# Patient Record
Sex: Female | Born: 1950 | Race: White | Hispanic: No | Smoking: Never smoker
Health system: Southern US, Community
[De-identification: ages and names within clinical notes are randomized; demographics above are authoritative.]

## PROBLEM LIST (undated history)

## (undated) DIAGNOSIS — Q600 Renal agenesis, unilateral: Secondary | ICD-10-CM

## (undated) DIAGNOSIS — I10 Essential (primary) hypertension: Secondary | ICD-10-CM

## (undated) DIAGNOSIS — N390 Urinary tract infection, site not specified: Secondary | ICD-10-CM

## (undated) DIAGNOSIS — Q5128 Other doubling of uterus, other specified: Secondary | ICD-10-CM

## (undated) DIAGNOSIS — B019 Varicella without complication: Secondary | ICD-10-CM

## (undated) DIAGNOSIS — Q512 Other doubling of uterus, unspecified: Secondary | ICD-10-CM

## (undated) HISTORY — DX: Essential (primary) hypertension: I10

## (undated) HISTORY — DX: Other doubling of uterus, unspecified: Q51.20

## (undated) HISTORY — DX: Other and unspecified doubling of uterus: Q51.28

## (undated) HISTORY — DX: Urinary tract infection, site not specified: N39.0

## (undated) HISTORY — DX: Renal agenesis, unilateral: Q60.0

## (undated) HISTORY — DX: Varicella without complication: B01.9

---

## 2011-09-13 ENCOUNTER — Encounter: Payer: Self-pay | Admitting: Internal Medicine

## 2011-09-13 ENCOUNTER — Ambulatory Visit (INDEPENDENT_AMBULATORY_CARE_PROVIDER_SITE_OTHER): Payer: BC Managed Care – PPO | Admitting: Internal Medicine

## 2011-09-13 VITALS — BP 182/102 | HR 78 | Temp 97.8°F | Ht 63.0 in | Wt 171.0 lb

## 2011-09-13 DIAGNOSIS — Z1239 Encounter for other screening for malignant neoplasm of breast: Secondary | ICD-10-CM

## 2011-09-13 DIAGNOSIS — Z1211 Encounter for screening for malignant neoplasm of colon: Secondary | ICD-10-CM

## 2011-09-13 DIAGNOSIS — N183 Chronic kidney disease, stage 3 unspecified: Secondary | ICD-10-CM | POA: Insufficient documentation

## 2011-09-13 DIAGNOSIS — E118 Type 2 diabetes mellitus with unspecified complications: Secondary | ICD-10-CM | POA: Insufficient documentation

## 2011-09-13 DIAGNOSIS — Z124 Encounter for screening for malignant neoplasm of cervix: Secondary | ICD-10-CM | POA: Insufficient documentation

## 2011-09-13 DIAGNOSIS — Q605 Renal hypoplasia, unspecified: Secondary | ICD-10-CM

## 2011-09-13 DIAGNOSIS — E119 Type 2 diabetes mellitus without complications: Secondary | ICD-10-CM | POA: Insufficient documentation

## 2011-09-13 DIAGNOSIS — I10 Essential (primary) hypertension: Secondary | ICD-10-CM

## 2011-09-13 DIAGNOSIS — E1165 Type 2 diabetes mellitus with hyperglycemia: Secondary | ICD-10-CM

## 2011-09-13 DIAGNOSIS — IMO0002 Reserved for concepts with insufficient information to code with codable children: Secondary | ICD-10-CM

## 2011-09-13 DIAGNOSIS — I152 Hypertension secondary to endocrine disorders: Secondary | ICD-10-CM | POA: Insufficient documentation

## 2011-09-13 DIAGNOSIS — Q602 Renal agenesis, unspecified: Secondary | ICD-10-CM

## 2011-09-13 DIAGNOSIS — Q600 Renal agenesis, unilateral: Secondary | ICD-10-CM | POA: Insufficient documentation

## 2011-09-13 DIAGNOSIS — IMO0001 Reserved for inherently not codable concepts without codable children: Secondary | ICD-10-CM

## 2011-09-13 DIAGNOSIS — I150 Renovascular hypertension: Secondary | ICD-10-CM | POA: Insufficient documentation

## 2011-09-13 MED ORDER — METFORMIN HCL 500 MG PO TABS: 500.0000 mg | ORAL_TABLET | Freq: Two times a day (BID) | ORAL | Status: DC

## 2011-09-13 MED ORDER — LISINOPRIL 10 MG PO TABS: 10.0000 mg | ORAL_TABLET | Freq: Every day | ORAL | Status: DC

## 2011-09-13 NOTE — Assessment & Plan Note (Signed)
Will set up screening colonoscopy. 

## 2011-09-13 NOTE — Progress Notes (Signed)
Subjective:    Patient ID: Danielle Park, female    DOB: September 11, 1950, 61 y.o.   MRN: [id number]  HPI 61 year old female with history of diabetes and hypertension presents to establish care. She reports she was recently diagnosed with diabetes when found to have sugar in her urine. She was started on metformin. She ran out of her metformin approximately 2 weeks ago. She does occasionally take her husband's metformin. She does not regularly check her blood sugars. She reports that she has difficulty affording the test strips. She is unsure what her last hemoglobin A1c was.  In regards to her hypertension, she also reports that she has been out of her lisinopril for about 2 weeks. She denies any headache, chest pain, palpitations. She has not been regularly checking her blood pressure. However, when she has checked in the past it has been near 100/90.  Outpatient Encounter Prescriptions as of 09/13/2011  Medication Sig Dispense Refill  . lisinopril (PRINIVIL,ZESTRIL) 10 MG tablet Take 1 tablet (10 mg total) by mouth daily.  90 tablet  3  . metFORMIN (GLUCOPHAGE) 500 MG tablet Take 1 tablet (500 mg total) by mouth 2 (two) times daily with a meal.  180 tablet  3   BP 182/102  Pulse 78  Temp 97.8 F (36.6 C) (Oral)  Ht 5\' 3"  (1.6 m)  Wt 171 lb (77.565 kg)  BMI 30.29 kg/m2  SpO2 98%  Review of Systems  Constitutional: Negative for fever, chills, appetite change, fatigue and unexpected weight change.  HENT: Negative for ear pain, congestion, sore throat, trouble swallowing, neck pain, voice change and sinus pressure.   Eyes: Negative for visual disturbance.  Respiratory: Negative for cough, shortness of breath, wheezing and stridor.   Cardiovascular: Negative for chest pain, palpitations and leg swelling.  Gastrointestinal: Negative for nausea, vomiting, abdominal pain, diarrhea, constipation, blood in stool, abdominal distention and anal bleeding.  Genitourinary: Negative for dysuria and flank  pain.  Musculoskeletal: Negative for myalgias, arthralgias and gait problem.  Skin: Negative for color change and rash.  Neurological: Negative for dizziness and headaches.  Hematological: Negative for adenopathy. Does not bruise/bleed easily.  Psychiatric/Behavioral: Negative for suicidal ideas, disturbed wake/sleep cycle and dysphoric mood. The patient is not nervous/anxious.        Objective:   Physical Exam  Constitutional: She is oriented to person, place, and time. She appears well-developed and well-nourished. No distress.  HENT:  Head: Normocephalic and atraumatic.  Right Ear: External ear normal.  Left Ear: External ear normal.  Nose: Nose normal.  Mouth/Throat: Oropharynx is clear and moist. No oropharyngeal exudate.  Eyes: Conjunctivae are normal. Pupils are equal, round, and reactive to light. Right eye exhibits no discharge. Left eye exhibits no discharge. No scleral icterus.  Neck: Normal range of motion. Neck supple. No tracheal deviation present. No thyromegaly present.  Cardiovascular: Normal rate, regular rhythm, normal heart sounds and intact distal pulses.  Exam reveals no gallop and no friction rub.   No murmur heard. Pulmonary/Chest: Effort normal and breath sounds normal. No respiratory distress. She has no wheezes. She has no rales. She exhibits no tenderness.  Abdominal: Soft. Bowel sounds are normal. She exhibits no distension and no mass. There is no tenderness. There is no rebound and no guarding.  Musculoskeletal: Normal range of motion. She exhibits no edema and no tenderness.  Lymphadenopathy:    She has no cervical adenopathy.  Neurological: She is alert and oriented to person, place, and time. No cranial nerve deficit. She  exhibits normal muscle tone. Coordination normal.  Skin: Skin is warm and dry. No rash noted. She is not diaphoretic. No erythema. No pallor.  Psychiatric: She has a normal mood and affect. Her behavior is normal. Judgment and thought  content normal.          Assessment & Plan:

## 2011-09-13 NOTE — Assessment & Plan Note (Signed)
>>  ASSESSMENT AND PLAN FOR HYPERTENSION ASSOCIATED WITH DIABETES (HCC) WRITTEN ON 09/13/2011  9:08 AM BY WALKER, JENNIFER A, MD  BP markedly elevated today. Pt has been off Lisinopril . Will restart. Will check renal function with labs in 1 week. Pt will check BP this week and email or call with results.

## 2011-09-13 NOTE — Assessment & Plan Note (Signed)
BG elevated, however pt out of medication. Will restart Metformin and check A1c in 1 week with labs. Pt refuses pneumovax and Tdap. Pt will set up referral for yearly eye exam. Follow up 1 month.

## 2011-09-13 NOTE — Assessment & Plan Note (Signed)
>>  ASSESSMENT AND PLAN FOR CONTROLLED DIABETES MELLITUS TYPE 2 WITH COMPLICATIONS (HCC) WRITTEN ON 09/13/2011  9:07 AM BY WALKER, JENNIFER A, MD  BG elevated, however pt out of medication. Will restart Metformin  and check A1c in 1 week with labs. Pt refuses pneumovax and Tdap. Pt will set up referral for yearly eye exam. Follow up 1 month.

## 2011-09-13 NOTE — Assessment & Plan Note (Signed)
BP markedly elevated today. Pt has been off Lisinopril. Will restart. Will check renal function with labs in 1 week. Pt will check BP this week and email or call with results.

## 2011-09-20 ENCOUNTER — Other Ambulatory Visit (INDEPENDENT_AMBULATORY_CARE_PROVIDER_SITE_OTHER): Payer: BC Managed Care – PPO | Admitting: *Deleted

## 2011-09-20 DIAGNOSIS — E1165 Type 2 diabetes mellitus with hyperglycemia: Secondary | ICD-10-CM

## 2011-09-21 LAB — COMPREHENSIVE METABOLIC PANEL
CO2: 27 mEq/L (ref 19–32)
Creatinine, Ser: 0.8 mg/dL (ref 0.4–1.2)
GFR: 79.88 mL/min (ref >60.00)
Glucose, Bld: 141 mg/dL — ABNORMAL HIGH (ref 70–99)
Total Bilirubin: 0.4 mg/dL (ref 0.3–1.2)

## 2011-09-21 LAB — LIPID PANEL
HDL: 44.9 mg/dL (ref >39.00)
Triglycerides: 244 mg/dL — ABNORMAL HIGH (ref 0.0–149.0)

## 2011-09-21 LAB — HEMOGLOBIN A1C: Hgb A1c MFr Bld: 9.6 % — ABNORMAL HIGH (ref 4.6–6.5)

## 2011-09-22 ENCOUNTER — Other Ambulatory Visit: Payer: Self-pay | Admitting: *Deleted

## 2011-09-22 MED ORDER — GLIPIZIDE ER 2.5 MG PO TB24: 2.5000 mg | ORAL_TABLET | Freq: Every day | ORAL | Status: DC

## 2011-10-04 ENCOUNTER — Encounter: Payer: BC Managed Care – PPO | Admitting: Internal Medicine

## 2011-10-05 DIAGNOSIS — I10 Essential (primary) hypertension: Secondary | ICD-10-CM

## 2011-10-05 DIAGNOSIS — M79661 Pain in right lower leg: Secondary | ICD-10-CM

## 2011-10-05 DIAGNOSIS — I7025 Atherosclerosis of native arteries of other extremities with ulceration: Secondary | ICD-10-CM

## 2011-11-21 ENCOUNTER — Other Ambulatory Visit (INDEPENDENT_AMBULATORY_CARE_PROVIDER_SITE_OTHER): Payer: BC Managed Care – PPO

## 2011-11-21 ENCOUNTER — Telehealth: Payer: Self-pay | Admitting: Internal Medicine

## 2011-11-21 DIAGNOSIS — N390 Urinary tract infection, site not specified: Secondary | ICD-10-CM

## 2011-11-21 DIAGNOSIS — R350 Frequency of micturition: Secondary | ICD-10-CM

## 2011-11-21 LAB — POCT URINALYSIS DIPSTICK
Bilirubin, UA: NEGATIVE
Ketones, UA: NEGATIVE
pH, UA: 5.5

## 2011-11-21 NOTE — Telephone Encounter (Signed)
Caller: Yoceline/Patient; Patient Name: Danielle Park; PCP: Ronna Polio (Adults only); Best Callback Phone Number: [phone].  Call regarding Urinary burning and frequency, onset 8-26.  Afebrile.  Patient didn't check FSBS on 8-27 due to drinking Cranberry juice for urinary symptoms.  All emergent symptoms ruled out per Urinary Symptoms Protocol, see in 24 hours.  No available appointments on 8-27, Patient would like to be seen on 8-27 if possible; otherwise, Patient will go to Urgent Care.  Please follow up with Patient.

## 2011-11-21 NOTE — Telephone Encounter (Signed)
Tried calling patient x 2, but line was busy. Will try again later.

## 2011-11-22 ENCOUNTER — Other Ambulatory Visit: Payer: Self-pay | Admitting: *Deleted

## 2011-11-22 MED ORDER — CIPROFLOXACIN HCL 500 MG PO TABS: 500.0000 mg | ORAL_TABLET | Freq: Two times a day (BID) | ORAL | Status: AC

## 2011-11-24 LAB — URINE CULTURE: Colony Count: >=100000

## 2011-12-21 ENCOUNTER — Ambulatory Visit: Payer: BC Managed Care – PPO | Admitting: Internal Medicine

## 2011-12-28 ENCOUNTER — Ambulatory Visit (INDEPENDENT_AMBULATORY_CARE_PROVIDER_SITE_OTHER): Payer: BC Managed Care – PPO | Admitting: Internal Medicine

## 2011-12-28 ENCOUNTER — Encounter: Payer: Self-pay | Admitting: Internal Medicine

## 2011-12-28 VITALS — BP 150/100 | HR 77 | Temp 98.2°F | Ht 63.0 in | Wt 176.2 lb

## 2011-12-28 DIAGNOSIS — R300 Dysuria: Secondary | ICD-10-CM

## 2011-12-28 DIAGNOSIS — I10 Essential (primary) hypertension: Secondary | ICD-10-CM

## 2011-12-28 DIAGNOSIS — E1165 Type 2 diabetes mellitus with hyperglycemia: Secondary | ICD-10-CM

## 2011-12-28 MED ORDER — LISINOPRIL 20 MG PO TABS: 20.0000 mg | ORAL_TABLET | Freq: Every day | ORAL | Status: DC

## 2011-12-28 MED ORDER — GLIPIZIDE ER 5 MG PO TB24: 5.0000 mg | ORAL_TABLET | Freq: Every day | ORAL | Status: DC

## 2011-12-29 LAB — POCT URINALYSIS DIPSTICK
Bilirubin, UA: NEGATIVE
Glucose, UA: NEGATIVE
Leukocytes, UA: NEGATIVE
Nitrite, UA: NEGATIVE

## 2012-01-01 LAB — CULTURE, URINE COMPREHENSIVE

## 2012-01-02 ENCOUNTER — Other Ambulatory Visit: Payer: Self-pay | Admitting: *Deleted

## 2012-01-02 MED ORDER — SULFAMETHOXAZOLE-TMP DS 800-160 MG PO TABS: 1.0000 | ORAL_TABLET | Freq: Two times a day (BID) | ORAL | Status: DC

## 2012-01-03 ENCOUNTER — Other Ambulatory Visit: Payer: Self-pay

## 2012-01-04 ENCOUNTER — Other Ambulatory Visit (INDEPENDENT_AMBULATORY_CARE_PROVIDER_SITE_OTHER): Payer: BC Managed Care – PPO

## 2012-01-04 DIAGNOSIS — IMO0002 Reserved for concepts with insufficient information to code with codable children: Secondary | ICD-10-CM

## 2012-01-04 DIAGNOSIS — I10 Essential (primary) hypertension: Secondary | ICD-10-CM

## 2012-01-04 DIAGNOSIS — E1165 Type 2 diabetes mellitus with hyperglycemia: Secondary | ICD-10-CM

## 2012-01-05 LAB — COMPREHENSIVE METABOLIC PANEL
ALT: 11 U/L (ref 0–35)
AST: 17 U/L (ref 0–37)
Alkaline Phosphatase: 92 U/L (ref 39–117)
BUN: 11 mg/dL (ref 6–23)
Chloride: 104 mEq/L (ref 96–112)
Creatinine, Ser: 1 mg/dL (ref 0.4–1.2)
Total Bilirubin: 0.4 mg/dL (ref 0.3–1.2)

## 2012-01-05 LAB — HEMOGLOBIN A1C: Hgb A1c MFr Bld: 7.2 % — ABNORMAL HIGH (ref 4.6–6.5)

## 2012-01-09 ENCOUNTER — Encounter: Payer: Self-pay | Admitting: *Deleted

## 2012-03-27 HISTORY — PX: RENAL ARTERY STENT: SHX2321

## 2012-04-04 ENCOUNTER — Ambulatory Visit: Payer: BC Managed Care – PPO | Admitting: Internal Medicine

## 2012-05-01 ENCOUNTER — Other Ambulatory Visit: Payer: Self-pay | Admitting: Internal Medicine

## 2012-05-13 ENCOUNTER — Telehealth: Payer: Self-pay | Admitting: *Deleted

## 2012-05-13 ENCOUNTER — Other Ambulatory Visit (INDEPENDENT_AMBULATORY_CARE_PROVIDER_SITE_OTHER): Payer: BC Managed Care – PPO

## 2012-05-13 DIAGNOSIS — E1059 Type 1 diabetes mellitus with other circulatory complications: Secondary | ICD-10-CM

## 2012-05-13 DIAGNOSIS — E139 Other specified diabetes mellitus without complications: Secondary | ICD-10-CM

## 2012-05-13 DIAGNOSIS — I10 Essential (primary) hypertension: Secondary | ICD-10-CM

## 2012-05-13 LAB — COMPREHENSIVE METABOLIC PANEL
ALT: 11 U/L (ref 0–35)
AST: 15 U/L (ref 0–37)
Alkaline Phosphatase: 107 U/L (ref 39–117)
Creatinine, Ser: 0.8 mg/dL (ref 0.4–1.2)
GFR: 82.14 mL/min (ref >60.00)
Total Bilirubin: 0.5 mg/dL (ref 0.3–1.2)

## 2012-05-13 LAB — LIPID PANEL
HDL: 44.1 mg/dL (ref >39.00)
Total CHOL/HDL Ratio: 5
Triglycerides: 188 mg/dL — ABNORMAL HIGH (ref 0.0–149.0)

## 2012-05-13 LAB — TSH: TSH: 4.49 u[IU]/mL (ref 0.35–5.50)

## 2012-05-13 NOTE — Telephone Encounter (Signed)
What labs and dx would you like for this pt?  

## 2012-05-13 NOTE — Telephone Encounter (Signed)
She has multiple canceled visits. Will need visit prior to ordering labs.

## 2012-05-14 NOTE — Progress Notes (Signed)
Left message on patient cell phone voicemail with information.

## 2012-05-15 ENCOUNTER — Encounter: Payer: Self-pay | Admitting: Internal Medicine

## 2012-05-15 ENCOUNTER — Ambulatory Visit (INDEPENDENT_AMBULATORY_CARE_PROVIDER_SITE_OTHER): Payer: BC Managed Care – PPO | Admitting: Internal Medicine

## 2012-05-15 VITALS — BP 210/110 | HR 80 | Temp 98.3°F | Wt 179.0 lb

## 2012-05-15 DIAGNOSIS — IMO0001 Reserved for inherently not codable concepts without codable children: Secondary | ICD-10-CM

## 2012-05-15 DIAGNOSIS — M79609 Pain in unspecified limb: Secondary | ICD-10-CM

## 2012-05-15 DIAGNOSIS — I10 Essential (primary) hypertension: Secondary | ICD-10-CM

## 2012-05-15 DIAGNOSIS — M79651 Pain in right thigh: Secondary | ICD-10-CM | POA: Insufficient documentation

## 2012-05-15 DIAGNOSIS — E1165 Type 2 diabetes mellitus with hyperglycemia: Secondary | ICD-10-CM

## 2012-05-15 LAB — MICROALBUMIN / CREATININE URINE RATIO
Microalb Creat Ratio: 44.8 mg/g — ABNORMAL HIGH (ref 0.0–30.0)
Microalb, Ur: 6 mg/dL — ABNORMAL HIGH (ref 0.0–1.9)

## 2012-05-15 MED ORDER — GLIPIZIDE ER 10 MG PO TB24: 10.0000 mg | ORAL_TABLET | Freq: Every day | ORAL | Status: DC

## 2012-05-15 MED ORDER — LISINOPRIL 40 MG PO TABS: 40.0000 mg | ORAL_TABLET | Freq: Every day | ORAL | Status: DC

## 2012-05-15 NOTE — Assessment & Plan Note (Signed)
Consistent with muscular strain. Will continue to monitor. Tylenol prn pain.

## 2012-05-15 NOTE — Progress Notes (Signed)
Subjective:    Patient ID: Danielle Park, female    DOB: 11-26-1950, 62 y.o.   MRN: [id number]  HPI 61YO female presents for follow up.  DM - BG running high, 150s-200. Compliant with glipizide. Has not been exercising as much because of right thigh pain after injury on elliptical. Following healthy diet.  HTN - BP running high 190-200/100s.  Compliant with lisinopril. No chest pain, headache, or other symptoms.  Has not been taking any NSAIDS or other medications.  Notes some right thigh pain after using ellipitical machine in 02/2012.  Pain has gradually improved without intervention. No weakness noted.    Outpatient Encounter Prescriptions as of 05/15/2012  Medication Sig Dispense Refill  . aspirin 81 MG tablet Take 81 mg by mouth daily.      Marland Kitchen lisinopril (PRINIVIL,ZESTRIL) 40 MG tablet Take 1 tablet (40 mg total) by mouth daily.  90 tablet  3  . metFORMIN (GLUCOPHAGE) 500 MG tablet Take 1 tablet (500 mg total) by mouth 2 (two) times daily with a meal.  180 tablet  3  . [DISCONTINUED] glipiZIDE (GLUCOTROL XL) 5 MG 24 hr tablet TAKE ONE TABLET BY MOUTH EVERY DAY  30 tablet  2  . [DISCONTINUED] lisinopril (PRINIVIL,ZESTRIL) 20 MG tablet Take 1 tablet (20 mg total) by mouth daily.  90 tablet  3  . glipiZIDE (GLIPIZIDE XL) 10 MG 24 hr tablet Take 1 tablet (10 mg total) by mouth daily.  90 tablet  3  . [DISCONTINUED] sulfamethoxazole-trimethoprim (BACTRIM DS) 800-160 MG per tablet Take 1 tablet by mouth 2 (two) times daily.  14 tablet  0   No facility-administered encounter medications on file as of 05/15/2012.   BP 210/110  Pulse 80  Temp(Src) 98.3 F (36.8 C) (Oral)  Wt 179 lb (81.194 kg)  BMI 31.72 kg/m2  SpO2 98%  Review of Systems  Constitutional: Negative for fever, chills, appetite change, fatigue and unexpected weight change.  HENT: Negative for ear pain, congestion, sore throat, trouble swallowing, neck pain, voice change and sinus pressure.   Eyes: Negative for visual  disturbance.  Respiratory: Negative for cough, shortness of breath, wheezing and stridor.   Cardiovascular: Negative for chest pain, palpitations and leg swelling.  Gastrointestinal: Negative for nausea, vomiting, abdominal pain, diarrhea, constipation, blood in stool, abdominal distention and anal bleeding.  Genitourinary: Negative for dysuria and flank pain.  Musculoskeletal: Positive for myalgias. Negative for arthralgias and gait problem.  Skin: Negative for color change and rash.  Neurological: Negative for dizziness and headaches.  Hematological: Negative for adenopathy. Does not bruise/bleed easily.  Psychiatric/Behavioral: Negative for suicidal ideas, sleep disturbance and dysphoric mood. The patient is not nervous/anxious.        Objective:   Physical Exam  Constitutional: She is oriented to person, place, and time. She appears well-developed and well-nourished. No distress.  HENT:  Head: Normocephalic and atraumatic.  Right Ear: External ear normal.  Left Ear: External ear normal.  Nose: Nose normal.  Mouth/Throat: Oropharynx is clear and moist. No oropharyngeal exudate.  Eyes: Conjunctivae are normal. Pupils are equal, round, and reactive to light. Right eye exhibits no discharge. Left eye exhibits no discharge. No scleral icterus.  Neck: Normal range of motion. Neck supple. No tracheal deviation present. No thyromegaly present.  Cardiovascular: Normal rate, regular rhythm, normal heart sounds and intact distal pulses.  Exam reveals no gallop and no friction rub.   No murmur heard. Pulmonary/Chest: Effort normal and breath sounds normal. No respiratory distress. She has no  wheezes. She has no rales. She exhibits no tenderness.  Musculoskeletal: Normal range of motion. She exhibits no edema and no tenderness.       Right hip: She exhibits normal range of motion, normal strength, no tenderness, no bony tenderness, no swelling and no deformity.  Lymphadenopathy:    She has no  cervical adenopathy.  Neurological: She is alert and oriented to person, place, and time. No cranial nerve deficit. She exhibits normal muscle tone. Coordination normal.  Skin: Skin is warm and dry. No rash noted. She is not diaphoretic. No erythema. No pallor.  Psychiatric: She has a normal mood and affect. Her behavior is normal. Judgment and thought content normal.          Assessment & Plan:

## 2012-05-15 NOTE — Assessment & Plan Note (Signed)
>>  ASSESSMENT AND PLAN FOR HYPERTENSION ASSOCIATED WITH DIABETES (HCC) WRITTEN ON 05/15/2012 10:37 AM BY WALKER, JENNIFER A, MD  BP markedly elevated last 2-3 weeks.  Renal function normal. Will check urine microalbumin. Will increase Lisinopril  to 40mg  daily and have her continue to monitor BP at home. Plan to follow up 1 week and recheck Cr and K with that visit. She will call sooner if any concerns, or if BP consistently >160/100.

## 2012-05-15 NOTE — Assessment & Plan Note (Signed)
BP markedly elevated last 2-3 weeks.  Renal function normal. Will check urine microalbumin. Will increase Lisinopril to 40mg  daily and have her continue to monitor BP at home. Plan to follow up 1 week and recheck Cr and K with that visit. She will call sooner if any concerns, or if BP consistently >160/100.

## 2012-05-15 NOTE — Assessment & Plan Note (Signed)
>>  ASSESSMENT AND PLAN FOR CONTROLLED DIABETES MELLITUS TYPE 2 WITH COMPLICATIONS (HCC) WRITTEN ON 05/15/2012 10:38 AM BY VANNIE NEST A, MD  Lab Results  Component Value Date   HGBA1C 8.0* 05/13/2012   A1c elevated, consistent with pt record of elevated BG. Will increase glipizide  to 10mg  daily. Pt will continue to monitor BG and bring record to follow up visit 1 week.

## 2012-05-15 NOTE — Assessment & Plan Note (Signed)
Lab Results  Component Value Date   HGBA1C 8.0* 05/13/2012   A1c elevated, consistent with pt record of elevated BG. Will increase glipizide to 10mg  daily. Pt will continue to monitor BG and bring record to follow up visit 1 week.

## 2012-05-16 ENCOUNTER — Telehealth: Payer: Self-pay | Admitting: *Deleted

## 2012-05-16 DIAGNOSIS — I10 Essential (primary) hypertension: Secondary | ICD-10-CM

## 2012-05-16 NOTE — Telephone Encounter (Signed)
Patient has agreed to this, do we need to send them a referral for this?

## 2012-05-16 NOTE — Telephone Encounter (Signed)
Ultrasound ordered

## 2012-05-16 NOTE — Telephone Encounter (Signed)
Patient aware they will be calling her to schedule.

## 2012-05-16 NOTE — Telephone Encounter (Signed)
Message copied by Theola Sequin on Thu May 16, 2012  8:23 AM ------      Message from: Ronna Polio A      Created: Wed May 15, 2012  4:49 PM       Labs show that urine protein levels are slightly higher. I would like to get an ultrasound of her kidney (she has 1 kidney) for additional evaluation. I would prefer to do this through [facility]. ------

## 2012-05-17 ENCOUNTER — Emergency Department: Payer: Self-pay | Admitting: Emergency Medicine

## 2012-05-17 ENCOUNTER — Telehealth: Payer: Self-pay | Admitting: *Deleted

## 2012-05-17 LAB — CBC
HCT: 44.3 % (ref 35.0–47.0)
HGB: 14.6 g/dL (ref 12.0–16.0)
MCH: 29.3 pg (ref 26.0–34.0)
MCHC: 32.9 g/dL (ref 32.0–36.0)
MCV: 89 fL (ref 80–100)
Platelet: 198 10*3/uL (ref 150–440)
RDW: 13.6 % (ref 11.5–14.5)
WBC: 8.6 10*3/uL (ref 3.6–11.0)

## 2012-05-17 LAB — COMPREHENSIVE METABOLIC PANEL
Albumin: 3.9 g/dL (ref 3.4–5.0)
BUN: 10 mg/dL (ref 7–18)
Bilirubin,Total: 0.4 mg/dL (ref 0.2–1.0)
Chloride: 108 mmol/L — ABNORMAL HIGH (ref 98–107)
Co2: 23 mmol/L (ref 21–32)
EGFR (African American): >60
Glucose: 275 mg/dL — ABNORMAL HIGH (ref 65–99)
Osmolality: 292 (ref 275–301)
Potassium: 3.6 mmol/L (ref 3.5–5.1)
SGOT(AST): 19 U/L (ref 15–37)

## 2012-05-17 LAB — URINALYSIS, COMPLETE
Bilirubin,UR: NEGATIVE
Ketone: NEGATIVE
Leukocyte Esterase: NEGATIVE
Nitrite: NEGATIVE
Protein: 30
RBC,UR: 1 /HPF (ref 0–5)
Specific Gravity: 1.003 (ref 1.003–1.030)
WBC UR: 2 /HPF (ref 0–5)

## 2012-05-17 LAB — CK TOTAL AND CKMB (NOT AT ARMC): CK, Total: 80 U/L (ref 21–215)

## 2012-05-17 NOTE — Telephone Encounter (Signed)
Advised patient to be evaluated today either at an urgent care or the ER, patient verbally agreed.

## 2012-05-17 NOTE — Telephone Encounter (Signed)
Patient Information:  Caller Name: [name]  Phone: [phone]  Patient: Danielle Park, Danielle Park  Gender: Female  DOB: 11-13-1950  Age: 62 Years  PCP: Ronna Polio (Adults only)  Office Follow Up:  Does the office need to follow up with this patient?: Yes  Instructions For The Office: Share b/p findings with Dr. Dan Humphreys and give patient the date and time of the additional testing ordered.   Symptoms  Reason For Call & Symptoms: Patient calling with an update on her b/p.  Her medication was doubled 2/19 at her office visit.  Her b/p today is 238/102.  Also needs to know if the kidney test has been scheduled?  Reviewed Health History In EMR: N/A  Reviewed Medications In EMR: N/A  Reviewed Allergies In EMR: N/A  Reviewed Surgeries / Procedures: N/A  Date of Onset of Symptoms: 05/15/2012  Treatments Tried: b/p medications were doubled on 2/19  Treatments Tried Worked: No  Guideline(s) Used:  High Blood Pressure  Disposition Per Guideline:   See Today in Office  Reason For Disposition Reached:   BP > 180/110  Advice Given:  N/A  RN Overrode Recommendation:  Document Patient  Was told to call and report the b/p findings.

## 2012-05-17 NOTE — Telephone Encounter (Signed)
If blood pressure remaining over 200 - I would recommend her being reevaluated- with blood pressure this level.  eval today.

## 2012-05-17 NOTE — Telephone Encounter (Signed)
Patient was seen on Wednesday 2/19 for hypertension. Her  BP has been running high over the last couple days at 200s over 100s and Dr. Dan Humphreys increased her Lisinopril, she is now taking 2 pills a day and she began this on Wednesday. BP is still running high and she is concerned, even though it has only been 2 days since increasing BP medication. Informed her that I would pass this message on to the physician but it could be she just have not given the medication time to take effect. Per patient Dr. Dan Humphreys told her if doubling the Lisinopril did not help in a couple days then give her a call back so she may add another medication. Please advise

## 2012-05-21 ENCOUNTER — Encounter: Payer: Self-pay | Admitting: Adult Health

## 2012-05-21 ENCOUNTER — Ambulatory Visit (INDEPENDENT_AMBULATORY_CARE_PROVIDER_SITE_OTHER): Payer: BC Managed Care – PPO | Admitting: Adult Health

## 2012-05-21 VITALS — BP 200/98 | HR 80 | Temp 98.1°F | Resp 14 | Wt 177.0 lb

## 2012-05-21 DIAGNOSIS — G4733 Obstructive sleep apnea (adult) (pediatric): Secondary | ICD-10-CM

## 2012-05-21 DIAGNOSIS — Z79899 Other long term (current) drug therapy: Secondary | ICD-10-CM

## 2012-05-21 DIAGNOSIS — R030 Elevated blood-pressure reading, without diagnosis of hypertension: Secondary | ICD-10-CM

## 2012-05-21 LAB — BASIC METABOLIC PANEL
CO2: 25 mEq/L (ref 19–32)
Calcium: 9.1 mg/dL (ref 8.4–10.5)
Creatinine, Ser: 0.8 mg/dL (ref 0.4–1.2)
GFR: 78.54 mL/min (ref >60.00)
Glucose, Bld: 223 mg/dL — ABNORMAL HIGH (ref 70–99)
Sodium: 136 mEq/L (ref 135–145)

## 2012-05-21 MED ORDER — AMLODIPINE BESYLATE 5 MG PO TABS: 5.0000 mg | ORAL_TABLET | Freq: Every day | ORAL | Status: DC

## 2012-05-21 MED ORDER — HYDRALAZINE HCL 50 MG PO TABS: 50.0000 mg | ORAL_TABLET | Freq: Three times a day (TID) | ORAL | Status: DC

## 2012-05-21 NOTE — Patient Instructions (Addendum)
  Please have her labs drawn prior to leaving the office.  I have ordered a renal artery Doppler that you have done today. Amber will give you directions to your test.  Please start hydralazine 50 mg 3 times a day. Continue the lisinopril 40 mg daily and the amlodipine 5 mg daily.  We will notify you of your lab results and the Doppler results once they are available.

## 2012-05-21 NOTE — Progress Notes (Signed)
  Subjective:    Patient ID: Danielle Park, female    DOB: 1950/08/05, 62 y.o.   MRN: [id number]  HPI  Patient is a 62 year old female with history of diabetes mellitus type 2, hypertension who presents to clinic today with complaints of significantly elevated blood pressure. She reports that she saw Dr. Dan Humphreys last Wednesday and her blood pressure was elevated then. Dr. Dan Humphreys increased her lisinopril from 20 mg to 40 mg daily. Patient reports that on Friday her blood pressure remained elevated. She went to the emergency room with blood pressure 250/110. The emergency room physician started her on amlodipine 5 mg daily. Patient reports that yesterday her blood pressure was still registering 252/112. She reports that she took a double dose of her lisinopril and amlodipine. She is here this morning because of concerns that her blood pressure does not subside.  Patient denies over-the-counter use of NSAIDs. She denies headaches, dizziness, numbness or lightheadedness. Patient's husband is present during the visit and he reports that patient snores.    Current Outpatient Prescriptions on File Prior to Visit  Medication Sig Dispense Refill  . aspirin 81 MG tablet Take 81 mg by mouth daily.      Marland Kitchen glipiZIDE (GLIPIZIDE XL) 10 MG 24 hr tablet Take 1 tablet (10 mg total) by mouth daily.  90 tablet  3  . lisinopril (PRINIVIL,ZESTRIL) 40 MG tablet Take 1 tablet (40 mg total) by mouth daily.  90 tablet  3  . metFORMIN (GLUCOPHAGE) 500 MG tablet Take 1 tablet (500 mg total) by mouth 2 (two) times daily with a meal.  180 tablet  3   No current facility-administered medications on file prior to visit.      Review of Systems  Constitutional: Negative.   Respiratory: Negative.   Cardiovascular: Negative.        Concerned about elevated b/p.  Genitourinary: Negative for decreased urine volume and difficulty urinating.  Neurological: Negative for dizziness, light-headedness and headaches.   Psychiatric/Behavioral: The patient is nervous/anxious.     BP 200/98  Pulse 80  Temp(Src) 98.1 F (36.7 C) (Oral)  Resp 14  Wt 177 lb (80.287 kg)  BMI 31.36 kg/m2  SpO2 98%     Objective:   Physical Exam  Constitutional: She is oriented to person, place, and time.  Overweight. Appears concerned.  Cardiovascular: Normal rate, regular rhythm and normal heart sounds.   Pulmonary/Chest: Effort normal and breath sounds normal.  Neurological: She is alert and oriented to person, place, and time.        Assessment & Plan:

## 2012-05-21 NOTE — Assessment & Plan Note (Signed)
Blood pressure is significantly elevated during visit today. Recheck blood pressure 180/96. Will start patient on hydralazine 50 mg 3 times a day. Will check renal function since change in ace inhibitor dose last Wednesday. Will send for renal artery Doppler today. Patient is n.p.o. Also, we will order sleep study to evaluate for obstructive sleep apnea.

## 2012-05-22 ENCOUNTER — Ambulatory Visit (INDEPENDENT_AMBULATORY_CARE_PROVIDER_SITE_OTHER): Payer: BC Managed Care – PPO | Admitting: Internal Medicine

## 2012-05-22 ENCOUNTER — Encounter: Payer: Self-pay | Admitting: Internal Medicine

## 2012-05-22 VITALS — BP 210/96 | HR 78 | Temp 98.3°F | Wt 177.0 lb

## 2012-05-22 DIAGNOSIS — I10 Essential (primary) hypertension: Secondary | ICD-10-CM

## 2012-05-22 DIAGNOSIS — I701 Atherosclerosis of renal artery: Secondary | ICD-10-CM | POA: Insufficient documentation

## 2012-05-22 NOTE — Assessment & Plan Note (Addendum)
Pt found to have RAS on doppler by [facility] (per her report). Will request results on this. Plan for stent placement with Dr. Wyn Quaker tomorrow. Will hold metformin given upcoming procedure. Discussed importance of avoiding NSAIDS. Follow up here in 2 weeks. Will set up nephrology evaluation.  Over face to face contact spent with pt and husband discussing plan of care.

## 2012-05-22 NOTE — Assessment & Plan Note (Signed)
>>  ASSESSMENT AND PLAN FOR HYPERTENSION ASSOCIATED WITH DIABETES (HCC) WRITTEN ON 05/22/2012  1:42 PM BY WALKER, JENNIFER A, MD  BP elevated, however has not started Hydralazine . Encouraged her to start this today. She will proceed with stent of renal artery tomorrow.  Follow up here in 2 weeks. Plan to set up evaluation with nephrology for this week or next.

## 2012-05-22 NOTE — Patient Instructions (Addendum)
STOP METFORMIN. Do not take any ibuprofen, aleve. Start Hydralazine.  Follow up 2 weeks.  Renal Artery Stenosis Renal artery stenosis (RAS) is the narrowing of the artery that supplies blood to the kidney. If the narrowing is critical and the kidney does not get enough blood, hypertension (high blood pressure) can develop. This is called renal vascular hypertension (RVH). This is a common, uncommon cause of secondary hypertension. It does not usually happen until there is at least a 70% narrowing of the artery. Decreased blood flow through the renal artery causes the kidney to release increased amounts of a hormone. It is called renin. Renin is a strong blood pressure regulator. When it is high, it causes changes that lead to hypertension. Eventually the kidney not receiving enough blood may shrink in size and become less useful. The high blood pressure that is produced can eventually damage and destroy the remaining kidney. This is called hypertensive nephrosclerosis. If both kidneys fail, it will lead to chronic renal failure.  CAUSES  Most renal artery stenosis is caused by a hardening of the arteries (atherosclerosis). This is called Atherosclerotic Renal Artery Stenosis (AS-RAS). It is caused by a build-up of cholesterol (plaques) on the inner lining of the renal artery. A much less common cause is Fibromuscular Dysplasia (FMD). With it, there is an abnormality in the muscular lining of the renal artery. FMD-RAS occurs almost exclusively in women aged 71 to 4. It rarely affects African Americans or Asians.  SYMPTOMS  Often high blood pressure is discovered on a routine blood pressure check. It may be the only sign that something is wrong. Other problems that may occur are:  You may develop calf pain when walking. This is called intermittent claudication. It may be a sign of bad circulation in the legs.  Inability to use certain blood pressure pills such as angiotensin-I (ACE-I) inhibitors or  angiotensin receptor blockers (ARB's). These could cause sudden drops in blood pressure with worsening of kidney function.  More than three antihypertensive medications may be needed for blood pressure control.  New onset of high blood pressure if you are over 55. DIAGNOSIS  Your caregiver may find suggestions of this on exam if he finds bruits (like murmurs) on listening to your abdomen (belly) or the large arteries in your neck. Your caregiver may also suspect this there is a sudden worsening of your blood pressure when it has been well controlled and you are over age 41. Additional testing that may be done includes:  One diagnostic method used for renal artery stenosis (RAS) is to measure and compare the level of renin, (blood pressure-regulating hormone released by the kidneys), in the right to the left renal veins. If the amount of renin released by one-side is markedly higher than the other, this identifies a high renin-releasing kidney consistent with RAS.  FMD-RAS is often found on renal scan with ACE-inhibitor challenge, or ultrasound with Doppler.  FMD responds well to angioplasty and stenting. The results of stenting in FMD are usually long lasting. RISK FACTORS  Most renal artery stenosis is caused by a hardening of the arteries. This is called atherosclerosis. Other risk factors associated with the development of atherosclerotic RAS include the following:   Carotid artery disease.  Obesity.  High blood pressure.  Heredity.  Old age.  Fibromuscular dysplasia.  Diabetes mellitus.  Smoking.  Hardening of the arteries. TREATMENT   Renal vascular hypertension can be very severe. It can also be difficult to control.  Medication is used  to control high blood pressure (hypertension). Blood pressure medications that directly affect the renin angiotensin pathway can be used toe help control blood pressure. ACE inhibitors and angiotensin receptor blockers (ARB's) are often  effective in patients with unilateral RAS. In some cases, patients with RAS are resistant to these medications.  In patients with bilateral RAS, these medications must be used carefully. They may cause acute renal failure (ARF). If acute renal failure develops (if creatinine increases by more than 30%), the medication is discontinued. The patient is evaluated for bilateral RAS.  Angioplasty and stenting may be used to improve blood flow. The goal is to improve the circulation of blood flow to the kidney and prevent the release of excess renin, which can help to decrease blood pressure. This helps to prevent atrophy of the kidney. In general, patients with AS-RAS should have stenting done. This is because plasty by itself has a high incidence of re-stenosis.  Surgery to bypass the narrowing may be done. If the kidney with RAS has diminished in size or strength (atrophied ), surgical removal of the kidney may be advised. This is called nephrectomy. Document Released: 12/07/2004 Document Revised: 06/05/2011 Document Reviewed: 03/12/2008 [facility] Patient Information 2013 [place], [place].

## 2012-05-22 NOTE — Assessment & Plan Note (Addendum)
BP elevated, however has not started Hydralazine. Encouraged her to start this today. She will proceed with stent of renal artery tomorrow.  Follow up here in 2 weeks. Plan to set up evaluation with nephrology for this week or next.

## 2012-05-22 NOTE — Progress Notes (Signed)
Subjective:    Patient ID: Danielle Park, female    DOB: Sep 12, 1950, 62 y.o.   MRN: [id number]  HPI 62 year old female with history of diabetes and hypertension presents for followup. She is recently had difficulty controlling her blood pressure. Blood pressure is typically been elevated 200/100. She was seen in clinic yesterday and instructed to start hydralazine. She has not yet filled this medication. She denies any headache, chest pain, palpitations. She underwent renal artery ultrasound which showed renal artery stenosis. She is scheduled for stent placement tomorrow.  Outpatient Encounter Prescriptions as of 05/22/2012  Medication Sig Dispense Refill  . amLODipine (NORVASC) 5 MG tablet Take 1 tablet (5 mg total) by mouth daily.  30 tablet  3  . aspirin 81 MG tablet Take 81 mg by mouth daily.      Marland Kitchen glipiZIDE (GLIPIZIDE XL) 10 MG 24 hr tablet Take 1 tablet (10 mg total) by mouth daily.  90 tablet  3  . lisinopril (PRINIVIL,ZESTRIL) 40 MG tablet Take 1 tablet (40 mg total) by mouth daily.  90 tablet  3  . metFORMIN (GLUCOPHAGE) 500 MG tablet Take 1 tablet (500 mg total) by mouth 2 (two) times daily with a meal.  180 tablet  3  . hydrALAZINE (APRESOLINE) 50 MG tablet Take 1 tablet (50 mg total) by mouth 3 (three) times daily.  90 tablet  3   No facility-administered encounter medications on file as of 05/22/2012.   BP 210/96  Pulse 78  Temp(Src) 98.3 F (36.8 C) (Oral)  Wt 177 lb (80.287 kg)  BMI 31.36 kg/m2  SpO2 97%  Review of Systems  Constitutional: Negative for fever, chills, appetite change, fatigue and unexpected weight change.  HENT: Negative for ear pain, congestion, sore throat, trouble swallowing, neck pain, voice change and sinus pressure.   Eyes: Negative for visual disturbance.  Respiratory: Negative for cough, shortness of breath, wheezing and stridor.   Cardiovascular: Negative for chest pain, palpitations and leg swelling.  Gastrointestinal: Negative for nausea,  vomiting, abdominal pain, diarrhea, constipation, blood in stool, abdominal distention and anal bleeding.  Genitourinary: Negative for dysuria and flank pain.  Musculoskeletal: Negative for myalgias, arthralgias and gait problem.  Skin: Negative for color change and rash.  Neurological: Negative for dizziness and headaches.  Hematological: Negative for adenopathy. Does not bruise/bleed easily.  Psychiatric/Behavioral: Negative for suicidal ideas, sleep disturbance and dysphoric mood. The patient is not nervous/anxious.        Objective:   Physical Exam  Constitutional: She is oriented to person, place, and time. She appears well-developed and well-nourished. No distress.  HENT:  Head: Normocephalic and atraumatic.  Right Ear: External ear normal.  Left Ear: External ear normal.  Nose: Nose normal.  Mouth/Throat: Oropharynx is clear and moist. No oropharyngeal exudate.  Eyes: Conjunctivae are normal. Pupils are equal, round, and reactive to light. Right eye exhibits no discharge. Left eye exhibits no discharge. No scleral icterus.  Neck: Normal range of motion. Neck supple. No tracheal deviation present. No thyromegaly present.  Cardiovascular: Normal rate, regular rhythm, normal heart sounds and intact distal pulses.  Exam reveals no gallop and no friction rub.   No murmur heard. Pulmonary/Chest: Effort normal and breath sounds normal. No respiratory distress. She has no wheezes. She has no rales. She exhibits no tenderness.  Musculoskeletal: Normal range of motion. She exhibits no edema and no tenderness.  Lymphadenopathy:    She has no cervical adenopathy.  Neurological: She is alert and oriented to person, place, and  time. No cranial nerve deficit. She exhibits normal muscle tone. Coordination normal.  Skin: Skin is warm and dry. No rash noted. She is not diaphoretic. No erythema. No pallor.  Psychiatric: She has a normal mood and affect. Her behavior is normal. Judgment and thought  content normal.          Assessment & Plan:

## 2012-05-27 ENCOUNTER — Ambulatory Visit: Payer: Self-pay | Admitting: Vascular Surgery

## 2012-05-27 LAB — BASIC METABOLIC PANEL
Anion Gap: 5 — ABNORMAL LOW (ref 7–16)
Chloride: 108 mmol/L — ABNORMAL HIGH (ref 98–107)
Co2: 27 mmol/L (ref 21–32)
Creatinine: 0.77 mg/dL (ref 0.60–1.30)
EGFR (Non-African Amer.): >60
Glucose: 253 mg/dL — ABNORMAL HIGH (ref 65–99)
Osmolality: 287 (ref 275–301)
Potassium: 4 mmol/L (ref 3.5–5.1)
Sodium: 140 mmol/L (ref 136–145)

## 2012-06-05 ENCOUNTER — Encounter: Payer: Self-pay | Admitting: Internal Medicine

## 2012-06-05 ENCOUNTER — Ambulatory Visit (INDEPENDENT_AMBULATORY_CARE_PROVIDER_SITE_OTHER): Payer: BC Managed Care – PPO | Admitting: Internal Medicine

## 2012-06-05 VITALS — BP 178/100 | HR 59 | Temp 98.4°F | Wt 178.0 lb

## 2012-06-05 DIAGNOSIS — I10 Essential (primary) hypertension: Secondary | ICD-10-CM

## 2012-06-05 DIAGNOSIS — I701 Atherosclerosis of renal artery: Secondary | ICD-10-CM

## 2012-06-05 DIAGNOSIS — E1165 Type 2 diabetes mellitus with hyperglycemia: Secondary | ICD-10-CM

## 2012-06-05 NOTE — Assessment & Plan Note (Signed)
S/p stent placement. Doing well. Will continue to monitor. Follow up with vascular in 2 weeks.

## 2012-06-05 NOTE — Progress Notes (Signed)
Subjective:    Patient ID: Danielle Park, female    DOB: 1951-03-09, 63 y.o.   MRN: [id number]  HPI 61YO female with DM, HTN, HL presents for follow up after recent stent placement for RAS. Reports she is feeling well. Energy level improved. Denies pain at site of vascular access. BP have been improved, typically 140-160s/80-90s at home. She was also seen by nephrology and started on metoprolol. Hydralazine was discontinued.   Blood sugars continue to be elevated, often >200. Has been off metformin because of recent vascular procedure. Taking Glipizide alone.  Outpatient Encounter Prescriptions as of 06/05/2012  Medication Sig Dispense Refill  . amLODipine (NORVASC) 5 MG tablet Take 1 tablet (5 mg total) by mouth daily.  30 tablet  3  . glipiZIDE (GLIPIZIDE XL) 10 MG 24 hr tablet Take 1 tablet (10 mg total) by mouth daily.  90 tablet  3  . lisinopril (PRINIVIL,ZESTRIL) 40 MG tablet Take 1 tablet (40 mg total) by mouth daily.  90 tablet  3  . metoprolol tartrate (LOPRESSOR) 25 MG tablet Take 25 mg by mouth 2 (two) times daily.      Marland Kitchen aspirin 81 MG tablet Take 81 mg by mouth daily.      . metFORMIN (GLUCOPHAGE) 500 MG tablet Take 1 tablet (500 mg total) by mouth 2 (two) times daily with a meal.  180 tablet  3  . [DISCONTINUED] hydrALAZINE (APRESOLINE) 50 MG tablet Take 1 tablet (50 mg total) by mouth 3 (three) times daily.  90 tablet  3   No facility-administered encounter medications on file as of 06/05/2012.   BP 178/100  Pulse 59  Temp(Src) 98.4 F (36.9 C) (Oral)  Wt 178 lb (80.74 kg)  BMI 31.54 kg/m2  SpO2 98%  Review of Systems  Constitutional: Negative for fever, chills, appetite change, fatigue and unexpected weight change.  HENT: Negative for ear pain, congestion, sore throat, trouble swallowing, neck pain, voice change and sinus pressure.   Eyes: Negative for visual disturbance.  Respiratory: Negative for cough, shortness of breath, wheezing and stridor.   Cardiovascular:  Negative for chest pain, palpitations and leg swelling.  Gastrointestinal: Negative for nausea, vomiting, abdominal pain, diarrhea, constipation, blood in stool, abdominal distention and anal bleeding.  Genitourinary: Negative for dysuria and flank pain.  Musculoskeletal: Negative for myalgias, arthralgias and gait problem.  Skin: Negative for color change and rash.  Neurological: Negative for dizziness and headaches.  Hematological: Negative for adenopathy. Does not bruise/bleed easily.  Psychiatric/Behavioral: Negative for suicidal ideas, sleep disturbance and dysphoric mood. The patient is not nervous/anxious.        Objective:   Physical Exam  Constitutional: She is oriented to person, place, and time. She appears well-developed and well-nourished. No distress.  HENT:  Head: Normocephalic and atraumatic.  Right Ear: External ear normal.  Left Ear: External ear normal.  Nose: Nose normal.  Mouth/Throat: Oropharynx is clear and moist. No oropharyngeal exudate.  Eyes: Conjunctivae are normal. Pupils are equal, round, and reactive to light. Right eye exhibits no discharge. Left eye exhibits no discharge. No scleral icterus.  Neck: Normal range of motion. Neck supple. No tracheal deviation present. No thyromegaly present.  Cardiovascular: Normal rate, regular rhythm, normal heart sounds and intact distal pulses.  Exam reveals no gallop and no friction rub.   No murmur heard. Pulmonary/Chest: Effort normal and breath sounds normal. No respiratory distress. She has no wheezes. She has no rales. She exhibits no tenderness.  Musculoskeletal: Normal range of motion. She  exhibits no edema and no tenderness.  Lymphadenopathy:    She has no cervical adenopathy.  Neurological: She is alert and oriented to person, place, and time. No cranial nerve deficit. She exhibits normal muscle tone. Coordination normal.  Skin: Skin is warm and dry. No rash noted. She is not diaphoretic. No erythema. No  pallor.     Psychiatric: She has a normal mood and affect. Her behavior is normal. Judgment and thought content normal.          Assessment & Plan:

## 2012-06-05 NOTE — Assessment & Plan Note (Addendum)
BP Readings from Last 3 Encounters:  06/05/12 178/100  05/22/12 210/96  05/21/12 200/98   BP improved generally after RAS placement. Better controlled at home than during visit today. Discussed increasing amlodipine, but will hold off as she has had some symptomatic low BP at home. Will continue current medications. Follow up with nephrology in 2 weeks.

## 2012-06-05 NOTE — Assessment & Plan Note (Signed)
BG elevated. Will resume metformin. Monitor renal function closely. Continue Glipizide. If no improvement over next few weeks, will consider adding Levemir. Follow up 4-8 weeks.

## 2012-06-05 NOTE — Assessment & Plan Note (Signed)
>>  ASSESSMENT AND PLAN FOR CONTROLLED DIABETES MELLITUS TYPE 2 WITH COMPLICATIONS ([facility]) WRITTEN ON 06/05/2012 12:27 PM BY WALKER, JENNIFER A, MD  BG elevated. Will resume metformin . Monitor renal function closely. Continue Glipizide . If no improvement over next few weeks, will consider adding Levemir . Follow up 4-8 weeks.

## 2012-06-05 NOTE — Assessment & Plan Note (Signed)
>>  ASSESSMENT AND PLAN FOR HYPERTENSION ASSOCIATED WITH DIABETES ([facility]) WRITTEN ON 06/05/2012 12:26 PM BY VANNIE, JENNIFER A, MD  BP Readings from Last 3 Encounters:  06/05/12 178/100  05/22/12 210/96  05/21/12 200/98   BP improved generally after RAS placement. Better controlled at home than during visit today. Discussed increasing amlodipine , but will hold off as she has had some symptomatic low BP at home. Will continue current medications. Follow up with nephrology in 2 weeks.

## 2012-07-04 ENCOUNTER — Telehealth: Payer: Self-pay | Admitting: Internal Medicine

## 2012-07-04 NOTE — Telephone Encounter (Signed)
Thyroid was underactive on recent labs done by Dr. Luther Redo.  This is Dr. Tilman Neat patient.  Please ask patient to make appt with Dr. Dan Humphreys to discuss

## 2012-07-05 NOTE — Telephone Encounter (Signed)
Spoke with patient and she stated Dr. Wyn Quaker called her in some thyroid medication. She will hold off on taking it until she hear from Dr. Dan Humphreys. Not sure what the strength of the medication is, has not went to the pharmacy to pick it up yet.

## 2012-07-05 NOTE — Telephone Encounter (Signed)
Left message for patient to return call.

## 2012-07-05 NOTE — Telephone Encounter (Signed)
Please have pt make follow up appointment and get labs and notes from Dr. Wyn Quaker.

## 2012-07-09 NOTE — Telephone Encounter (Signed)
Left message with a female to have patient call office back

## 2012-07-09 NOTE — Telephone Encounter (Signed)
Spoke with patient and she has began taking the thyroid medication. And she has an appointment already scheduled for 5/15. If she thinks she need to be seen sooner then she will call the office the morning.

## 2012-07-16 ENCOUNTER — Telehealth: Payer: Self-pay | Admitting: *Deleted

## 2012-07-16 NOTE — Telephone Encounter (Signed)
Mandy from Dr. Alice Rieger office called and left message, requesting a call back from Dr. Dan Humphreys. Dr. Wynelle Link would like to discuss pt.

## 2012-07-16 NOTE — Telephone Encounter (Signed)
Returned phone call and left message

## 2012-07-24 ENCOUNTER — Emergency Department: Payer: Self-pay | Admitting: Emergency Medicine

## 2012-07-24 LAB — CBC
HGB: 13.4 g/dL (ref 12.0–16.0)
MCH: 29.7 pg (ref 26.0–34.0)
MCV: 86 fL (ref 80–100)
RBC: 4.5 10*6/uL (ref 3.80–5.20)
WBC: 8.3 10*3/uL (ref 3.6–11.0)

## 2012-07-24 LAB — URINALYSIS, COMPLETE
Bilirubin,UR: NEGATIVE
Ketone: NEGATIVE
Nitrite: NEGATIVE
Ph: 9 (ref 4.5–8.0)
RBC,UR: <1 /HPF (ref 0–5)
Squamous Epithelial: 1
WBC UR: 5 /HPF (ref 0–5)

## 2012-07-24 LAB — COMPREHENSIVE METABOLIC PANEL
Albumin: 3.8 g/dL (ref 3.4–5.0)
Alkaline Phosphatase: 166 U/L — ABNORMAL HIGH (ref 50–136)
BUN: 18 mg/dL (ref 7–18)
Bilirubin,Total: 0.3 mg/dL (ref 0.2–1.0)
Calcium, Total: 9.7 mg/dL (ref 8.5–10.1)
Creatinine: 0.93 mg/dL (ref 0.60–1.30)
SGOT(AST): 21 U/L (ref 15–37)
SGPT (ALT): 15 U/L (ref 12–78)
Sodium: 137 mmol/L (ref 136–145)
Total Protein: 8.4 g/dL — ABNORMAL HIGH (ref 6.4–8.2)

## 2012-07-24 LAB — TROPONIN I: Troponin-I: <0.02 ng/mL

## 2012-07-25 ENCOUNTER — Telehealth: Payer: Self-pay | Admitting: Internal Medicine

## 2012-07-25 NOTE — Telephone Encounter (Signed)
Yes, she needs to be seen tomorrow.

## 2012-07-25 NOTE — Telephone Encounter (Signed)
Put patient on the schedule for tomorrow, will call back in the morning for confirmation

## 2012-07-25 NOTE — Telephone Encounter (Signed)
Patient Information:  Caller Name: Mollie  Phone: [phone]  Patient: Danielle Park, Danielle Park  Gender: Female  DOB: 1950-10-19  Age: 62 Years  PCP: Ronna Polio (Adults only)  Office Follow Up:  Does the office need to follow up with this patient?: Yes  Instructions For The Office: OFFICE PLEASE FOLLOW UP WITH PT IF OFFICE VISIT IS NEEDED TO DISCUSS MEDICAITONS OR IF MEDICATIONS NEED TO BE CHANGED.  Pharmacy is [facility] off [address]   Symptoms  Reason For Call & Symptoms: pt reports that last night (07/24/12) her BP was 225/100.  Pt was seen in the ED and given Labetalol to bring down the BP.  BP is currently 170/75 (pt has not taken any am meds)  Pt was told to call PCP today to see if medications need to be changed.  Reviewed Health History In EMR: Yes  Reviewed Medications In EMR: Yes  Reviewed Allergies In EMR: Yes  Reviewed Surgeries / Procedures: Yes  Date of Onset of Symptoms: 07/24/2012  Guideline(s) Used:  High Blood Pressure  Disposition Per Guideline:   See Within 2 Weeks in Office  Reason For Disposition Reached:   BP > 160/100  Advice Given:  N/A  Patient Refused Recommendation:  Patient Refused Care Advice  pt wants to know if medications need to be changed and when should she take her regular medications after being treated with the Labetalol IV at 0500 this am

## 2012-07-26 ENCOUNTER — Encounter: Payer: Self-pay | Admitting: Internal Medicine

## 2012-07-26 ENCOUNTER — Ambulatory Visit (INDEPENDENT_AMBULATORY_CARE_PROVIDER_SITE_OTHER): Payer: BC Managed Care – PPO | Admitting: Internal Medicine

## 2012-07-26 VITALS — BP 200/90 | HR 70 | Temp 98.6°F | Wt 176.0 lb

## 2012-07-26 DIAGNOSIS — I701 Atherosclerosis of renal artery: Secondary | ICD-10-CM

## 2012-07-26 DIAGNOSIS — E039 Hypothyroidism, unspecified: Secondary | ICD-10-CM

## 2012-07-26 DIAGNOSIS — M79651 Pain in right thigh: Secondary | ICD-10-CM

## 2012-07-26 DIAGNOSIS — M79609 Pain in unspecified limb: Secondary | ICD-10-CM

## 2012-07-26 DIAGNOSIS — I10 Essential (primary) hypertension: Secondary | ICD-10-CM

## 2012-07-26 NOTE — Assessment & Plan Note (Addendum)
BP Readings from Last 3 Encounters:  07/26/12 200/90  06/05/12 178/100  05/22/12 210/96   BP markedly elevated today, however has been much lower at home, generally 120-130s/80s. BP was well controlled after recent renal artery stent. Sudden increase in BP concerning for stenosis of renal artery stent. Pt reports that renal US and labs in ED at [facility] 5/1 were normal. Discussed with vascular surgeon. Pt will have angiogram on Monday for re-evaluation. Will continue current medications for now, however discussed with pt possibly increasing Amlodipine to 10mg  daily if BP elevated at home >160/100. Could also use prn hydralazine if increased amlodipine not tolerated. Pt will call or email with BP and with any changes in symptoms.

## 2012-07-26 NOTE — Assessment & Plan Note (Signed)
>>  ASSESSMENT AND PLAN FOR HYPERTENSION ASSOCIATED WITH DIABETES (HCC) WRITTEN ON 07/26/2012  7:12 PM BY WALKER, JENNIFER A, MD  BP Readings from Last 3 Encounters:  07/26/12 200/90  06/05/12 178/100  05/22/12 210/96   BP markedly elevated today, however has been much lower at home, generally 120-130s/80s. BP was well controlled after recent renal artery stent. Sudden increase in BP concerning for stenosis of renal artery stent. Pt reports that renal US  and labs in ED at [facility] 5/1 were normal. Discussed with vascular surgeon. Pt will have angiogram on Monday for re-evaluation. Will continue current medications for now, however discussed with pt possibly increasing Amlodipine  to 10mg  daily if BP elevated at home >160/100. Could also use prn hydralazine  if increased amlodipine  not tolerated. Pt will call or email with BP and with any changes in symptoms.

## 2012-07-26 NOTE — Assessment & Plan Note (Signed)
Recurrent symptoms of right groin pain and lower back pain. Symptoms are relatively mild and controlled without medication. Will hold off on further evaluation/treatment for now until acute issue with BP resolved. Follow up 1 week.

## 2012-07-26 NOTE — Assessment & Plan Note (Signed)
Recently found to have elevated TSH on check by nephrologist. On Levothyroxine. Will plan to repeat TSH with labs in 08/2012.

## 2012-07-26 NOTE — Assessment & Plan Note (Signed)
Markedly elevated BP concerning for restenosis of stent. D/w Dr. Wyn Quaker. Pt evaluated in their office today. Will have angiogram on Monday with plan to re-open stent if necessary. Follow up here in 1 week.

## 2012-07-26 NOTE — Progress Notes (Signed)
Subjective:    Patient ID: Danielle Park, female    DOB: Oct 28, 1950, 62 y.o.   MRN: [id number]  HPI 62YO female with h/o DM, HTN presents for acute visit. Notes that BP has been labile recently, 120-130s/70-80s at home, then suddenly will increase to 200s/100s. Went to ED 2 days ago, was given IV labetalol with reduction of BP and had renal US which was reportedly normal. Discharged home without further intervention. Denies new medications or supplements. Has been compliant with meds as prescribed. Denies chest pain, headache, palpitations, or other symptoms.   Also concerned about some recurrent symptoms of right thigh/groin and low back pain over last few weeks. Described as aching pain. Worse with movement. Not taking any medications for this. Denies weakness in her legs. Occasionally has some aching or burning pain in lower extremities.  Outpatient Encounter Prescriptions as of 07/26/2012  Medication Sig Dispense Refill  . amLODipine (NORVASC) 5 MG tablet Take 1 tablet (5 mg total) by mouth daily.  30 tablet  3  . aspirin 81 MG tablet Take 81 mg by mouth daily.      Marland Kitchen glipiZIDE (GLIPIZIDE XL) 10 MG 24 hr tablet Take 1 tablet (10 mg total) by mouth daily.  90 tablet  3  . lisinopril (PRINIVIL,ZESTRIL) 40 MG tablet Take 1 tablet (40 mg total) by mouth daily.  90 tablet  3  . metFORMIN (GLUCOPHAGE) 500 MG tablet Take 1 tablet (500 mg total) by mouth 2 (two) times daily with a meal.  180 tablet  3  . metoprolol tartrate (LOPRESSOR) 25 MG tablet Take 25 mg by mouth 2 (two) times daily.       No facility-administered encounter medications on file as of 07/26/2012.   BP 200/90  Pulse 70  Temp(Src) 98.6 F (37 C) (Oral)  Wt 176 lb (79.833 kg)  BMI 31.18 kg/m2  SpO2 97%  Review of Systems  Constitutional: Negative for fever, chills, appetite change, fatigue and unexpected weight change.  HENT: Negative for ear pain, congestion, sore throat, trouble swallowing, neck pain, voice change and sinus  pressure.   Eyes: Negative for visual disturbance.  Respiratory: Negative for cough, shortness of breath, wheezing and stridor.   Cardiovascular: Negative for chest pain, palpitations and leg swelling.  Gastrointestinal: Negative for nausea, vomiting, abdominal pain, diarrhea, constipation, blood in stool, abdominal distention and anal bleeding.  Genitourinary: Negative for dysuria and flank pain.  Musculoskeletal: Positive for myalgias, back pain and arthralgias. Negative for gait problem.  Skin: Negative for color change and rash.  Neurological: Negative for dizziness and headaches.  Hematological: Negative for adenopathy. Does not bruise/bleed easily.  Psychiatric/Behavioral: Negative for suicidal ideas, sleep disturbance and dysphoric mood. The patient is not nervous/anxious.        Objective:   Physical Exam  Constitutional: She is oriented to person, place, and time. She appears well-developed and well-nourished. No distress.  HENT:  Head: Normocephalic and atraumatic.  Right Ear: External ear normal.  Left Ear: External ear normal.  Nose: Nose normal.  Mouth/Throat: Oropharynx is clear and moist. No oropharyngeal exudate.  Eyes: Conjunctivae are normal. Pupils are equal, round, and reactive to light. Right eye exhibits no discharge. Left eye exhibits no discharge. No scleral icterus.  Neck: Normal range of motion. Neck supple. No tracheal deviation present. No thyromegaly present.  Cardiovascular: Normal rate, regular rhythm, normal heart sounds and intact distal pulses.  Exam reveals no gallop and no friction rub.   No murmur heard. Pulmonary/Chest: Effort normal and  breath sounds normal. No respiratory distress. She has no wheezes. She has no rales. She exhibits no tenderness.  Musculoskeletal: She exhibits no edema and no tenderness.       Right hip: She exhibits normal range of motion, normal strength and no tenderness.       Lumbar back: She exhibits pain. She exhibits  normal range of motion, no tenderness, no swelling and no edema.  Lymphadenopathy:    She has no cervical adenopathy.  Neurological: She is alert and oriented to person, place, and time. No cranial nerve deficit. She exhibits normal muscle tone. Coordination normal.  Skin: Skin is warm and dry. No rash noted. She is not diaphoretic. No erythema. No pallor.  Psychiatric: She has a normal mood and affect. Her behavior is normal. Judgment and thought content normal.          Assessment & Plan:

## 2012-07-26 NOTE — Telephone Encounter (Signed)
Patient confirmed appointment for today

## 2012-07-29 ENCOUNTER — Ambulatory Visit: Payer: Self-pay | Admitting: Vascular Surgery

## 2012-07-29 LAB — BASIC METABOLIC PANEL
Anion Gap: 5 — ABNORMAL LOW (ref 7–16)
BUN: 13 mg/dL (ref 7–18)
Calcium, Total: 9.4 mg/dL (ref 8.5–10.1)
Chloride: 104 mmol/L (ref 98–107)
Co2: 28 mmol/L (ref 21–32)
EGFR (Non-African Amer.): >60
Potassium: 4.6 mmol/L (ref 3.5–5.1)

## 2012-07-31 ENCOUNTER — Encounter: Payer: Self-pay | Admitting: Internal Medicine

## 2012-07-31 ENCOUNTER — Ambulatory Visit (INDEPENDENT_AMBULATORY_CARE_PROVIDER_SITE_OTHER): Payer: BC Managed Care – PPO | Admitting: Internal Medicine

## 2012-07-31 VITALS — BP 170/72 | HR 57 | Temp 98.1°F | Wt 176.0 lb

## 2012-07-31 DIAGNOSIS — E1165 Type 2 diabetes mellitus with hyperglycemia: Secondary | ICD-10-CM

## 2012-07-31 DIAGNOSIS — I10 Essential (primary) hypertension: Secondary | ICD-10-CM

## 2012-07-31 DIAGNOSIS — I701 Atherosclerosis of renal artery: Secondary | ICD-10-CM

## 2012-07-31 LAB — COMPREHENSIVE METABOLIC PANEL
Alkaline Phosphatase: 129 U/L — ABNORMAL HIGH (ref 39–117)
BUN: 15 mg/dL (ref 6–23)
Creatinine, Ser: 0.9 mg/dL (ref 0.4–1.2)
Glucose, Bld: 313 mg/dL — ABNORMAL HIGH (ref 70–99)
Total Bilirubin: 0.7 mg/dL (ref 0.3–1.2)

## 2012-07-31 LAB — HEMOGLOBIN A1C: Hgb A1c MFr Bld: 8.3 % — ABNORMAL HIGH (ref 4.6–6.5)

## 2012-07-31 MED ORDER — HYDRALAZINE HCL 25 MG PO TABS: 25.0000 mg | ORAL_TABLET | Freq: Three times a day (TID) | ORAL | Status: DC | PRN

## 2012-07-31 MED ORDER — AMLODIPINE BESYLATE 10 MG PO TABS: 10.0000 mg | ORAL_TABLET | Freq: Every day | ORAL | Status: DC

## 2012-07-31 NOTE — Assessment & Plan Note (Signed)
Congenital single kidney. S/p repeat angioplasty of RAS on Monday.

## 2012-07-31 NOTE — Patient Instructions (Signed)
Increase dose of Amlodipine to 10mg  daily.  Start Hydralazine 25mg  up to three times daily if blood pressure >160/100.  Follow up 1 week.

## 2012-07-31 NOTE — Assessment & Plan Note (Signed)
>>  ASSESSMENT AND PLAN FOR CONTROLLED DIABETES MELLITUS TYPE 2 WITH COMPLICATIONS (HCC) WRITTEN ON 07/31/2012 11:40 AM BY WALKER, JENNIFER A, MD  Will check A1c with labs today. Will stop metformin  given side effect of generalized malaise.  Continue Glipizide . Pt will continue to monitor BG at home. Follow up 3 months for recheck A1c.

## 2012-07-31 NOTE — Assessment & Plan Note (Signed)
>>  ASSESSMENT AND PLAN FOR HYPERTENSION ASSOCIATED WITH DIABETES (HCC) WRITTEN ON 07/31/2012 11:41 AM BY VANNIE, JENNIFER A, MD  BP Readings from Last 3 Encounters:  07/31/12 170/72  07/26/12 200/90  06/05/12 178/100   BP continues to be elevated after angioplasty RAS on Monday. Will increase Amlodipine  to 10mg  daily. Will add Hydralazine  25mg  po tid prn if BP >160/100. Follow up 1 week and prn.

## 2012-07-31 NOTE — Progress Notes (Signed)
Subjective:    Patient ID: Danielle Park, female    DOB: 1950-06-17, 62 y.o.   MRN: [id number]  HPI 62 year old female with history of congenital single kidney, hypertension, renal artery stenosis status post stent placement and repeat angioplasty on Monday, 07/29/2012, and diabetes presents for followup. Patient reports that procedure on Monday but well. Mild stenosis of the renal artery was seen and she underwent angioplasty. However, blood pressures continue to be elevated. She reports that blood pressures are better controlled at home, typically less than 140/90. She denies any headache, chest pain, palpitations. She is generally feeling well.  Regards to diabetes, she reports blood sugars have been well-controlled. She notes some generalized malaise when she takes metformin and would like to stop this medication. She denies any blood sugars greater than 200 or less than 70.  Outpatient Encounter Prescriptions as of 07/31/2012  Medication Sig Dispense Refill  . amLODipine (NORVASC) 10 MG tablet Take 1 tablet (10 mg total) by mouth daily.  30 tablet  3  . aspirin 81 MG tablet Take 81 mg by mouth daily.      Marland Kitchen glipiZIDE (GLIPIZIDE XL) 10 MG 24 hr tablet Take 1 tablet (10 mg total) by mouth daily.  90 tablet  3  . levothyroxine (SYNTHROID, LEVOTHROID) 75 MCG tablet Take 75 mcg by mouth daily before breakfast.      . lisinopril (PRINIVIL,ZESTRIL) 40 MG tablet Take 1 tablet (40 mg total) by mouth daily.  90 tablet  3  . metoprolol tartrate (LOPRESSOR) 25 MG tablet Take 25 mg by mouth 2 (two) times daily.      . hydrALAZINE (APRESOLINE) 25 MG tablet Take 1 tablet (25 mg total) by mouth 3 (three) times daily as needed.  30 tablet  3   No facility-administered encounter medications on file as of 07/31/2012.   BP 170/72  Pulse 57  Temp(Src) 98.1 F (36.7 C) (Oral)  Wt 176 lb (79.833 kg)  BMI 31.18 kg/m2  SpO2 99%  Review of Systems  Constitutional: Negative for fever, chills, appetite change,  fatigue and unexpected weight change.  HENT: Negative for ear pain, congestion, sore throat, trouble swallowing, neck pain, voice change and sinus pressure.   Eyes: Negative for visual disturbance.  Respiratory: Negative for cough, shortness of breath, wheezing and stridor.   Cardiovascular: Negative for chest pain, palpitations and leg swelling.  Gastrointestinal: Negative for nausea, vomiting, abdominal pain, diarrhea, constipation, blood in stool, abdominal distention and anal bleeding.  Genitourinary: Negative for dysuria and flank pain.  Musculoskeletal: Negative for myalgias, arthralgias and gait problem.  Skin: Negative for color change and rash.  Neurological: Negative for dizziness and headaches.  Hematological: Negative for adenopathy. Does not bruise/bleed easily.  Psychiatric/Behavioral: Negative for suicidal ideas, sleep disturbance and dysphoric mood. The patient is not nervous/anxious.        Objective:   Physical Exam  Constitutional: She is oriented to person, place, and time. She appears well-developed and well-nourished. No distress.  HENT:  Head: Normocephalic and atraumatic.  Right Ear: External ear normal.  Left Ear: External ear normal.  Nose: Nose normal.  Mouth/Throat: Oropharynx is clear and moist. No oropharyngeal exudate.  Eyes: Conjunctivae are normal. Pupils are equal, round, and reactive to light. Right eye exhibits no discharge. Left eye exhibits no discharge. No scleral icterus.  Neck: Normal range of motion. Neck supple. No tracheal deviation present. No thyromegaly present.  Cardiovascular: Normal rate, regular rhythm, normal heart sounds and intact distal pulses.  Exam reveals no  gallop and no friction rub.   No murmur heard. Pulmonary/Chest: Effort normal and breath sounds normal. No respiratory distress. She has no wheezes. She has no rales. She exhibits no tenderness.  Abdominal: Soft. Bowel sounds are normal. She exhibits no distension. There is no  tenderness.    Musculoskeletal: Normal range of motion. She exhibits no edema and no tenderness.  Lymphadenopathy:    She has no cervical adenopathy.  Neurological: She is alert and oriented to person, place, and time. No cranial nerve deficit. She exhibits normal muscle tone. Coordination normal.  Skin: Skin is warm and dry. No rash noted. She is not diaphoretic. No erythema. No pallor.  Psychiatric: She has a normal mood and affect. Her behavior is normal. Judgment and thought content normal.          Assessment & Plan:

## 2012-07-31 NOTE — Assessment & Plan Note (Signed)
Will check A1c with labs today. Will stop metformin given side effect of generalized malaise.  Continue Glipizide. Pt will continue to monitor BG at home. Follow up 3 months for recheck A1c.

## 2012-07-31 NOTE — Assessment & Plan Note (Signed)
BP Readings from Last 3 Encounters:  07/31/12 170/72  07/26/12 200/90  06/05/12 178/100   BP continues to be elevated after angioplasty RAS on Monday. Will increase Amlodipine to 10mg  daily. Will add Hydralazine 25mg  po tid prn if BP >160/100. Follow up 1 week and prn.

## 2012-08-01 ENCOUNTER — Encounter: Payer: Self-pay | Admitting: Internal Medicine

## 2012-08-08 ENCOUNTER — Ambulatory Visit: Payer: BC Managed Care – PPO | Admitting: Internal Medicine

## 2012-08-12 ENCOUNTER — Ambulatory Visit (INDEPENDENT_AMBULATORY_CARE_PROVIDER_SITE_OTHER): Payer: BC Managed Care – PPO | Admitting: Internal Medicine

## 2012-08-12 ENCOUNTER — Encounter: Payer: Self-pay | Admitting: Internal Medicine

## 2012-08-12 VITALS — BP 170/98 | HR 65 | Temp 98.0°F | Wt 178.0 lb

## 2012-08-12 DIAGNOSIS — R300 Dysuria: Secondary | ICD-10-CM

## 2012-08-12 DIAGNOSIS — IMO0001 Reserved for inherently not codable concepts without codable children: Secondary | ICD-10-CM

## 2012-08-12 DIAGNOSIS — E1165 Type 2 diabetes mellitus with hyperglycemia: Secondary | ICD-10-CM

## 2012-08-12 DIAGNOSIS — M79609 Pain in unspecified limb: Secondary | ICD-10-CM

## 2012-08-12 DIAGNOSIS — I10 Essential (primary) hypertension: Secondary | ICD-10-CM

## 2012-08-12 DIAGNOSIS — M79651 Pain in right thigh: Secondary | ICD-10-CM

## 2012-08-12 LAB — POCT URINALYSIS DIPSTICK
Bilirubin, UA: NEGATIVE
Glucose, UA: NEGATIVE
Nitrite, UA: NEGATIVE
pH, UA: 5.5

## 2012-08-12 MED ORDER — SAXAGLIPTIN HCL 2.5 MG PO TABS: 2.5000 mg | ORAL_TABLET | Freq: Every day | ORAL | Status: DC

## 2012-08-12 NOTE — Assessment & Plan Note (Signed)
>>  ASSESSMENT AND PLAN FOR CONTROLLED DIABETES MELLITUS TYPE 2 WITH COMPLICATIONS (HCC) WRITTEN ON 08/12/2012  7:08 PM BY WALKER, JENNIFER A, MD  BG continue to be elevated. Will add Onglyza 2.5mg  daily. Pt will monitor BG carefully and call if any BG<70. Follow up 4 weeks and prn.

## 2012-08-12 NOTE — Assessment & Plan Note (Signed)
>>  ASSESSMENT AND PLAN FOR HYPERTENSION ASSOCIATED WITH DIABETES (HCC) WRITTEN ON 08/12/2012  7:06 PM BY VANNIE, JENNIFER A, MD  BP Readings from Last 3 Encounters:  08/12/12 170/98  07/31/12 170/72  07/26/12 200/90   BP elevated today in clinic but much improved at home. Will continue current medications. Continue Hydralazine  prn BP >160/100. Follow up 1 month.

## 2012-08-12 NOTE — Assessment & Plan Note (Signed)
BG continue to be elevated. Will add Onglyza 2.5mg  daily. Pt will monitor BG carefully and call if any BG<70. Follow up 4 weeks and prn.

## 2012-08-12 NOTE — Assessment & Plan Note (Signed)
BP Readings from Last 3 Encounters:  08/12/12 170/98  07/31/12 170/72  07/26/12 200/90   BP elevated today in clinic but much improved at home. Will continue current medications. Continue Hydralazine prn BP >160/100. Follow up 1 month.

## 2012-08-12 NOTE — Progress Notes (Signed)
Subjective:    Patient ID: Danielle Park, female    DOB: 1950/11/04, 62 y.o.   MRN: [id number]  HPI 62 year old female with history of congenital single kidney, renal artery stenosis, diabetes, hypertension presents for followup. In regards to hypertension, she reports blood pressures have generally been between 115 -130/70-80. She has been compliant with medication. She denies any chest pain, headache, palpitations. She is back working full-time.  In regards to diabetes, she reports blood sugars have continued to be elevated, typically between 150 and 250. She is compliant with medications. She is trying to limit intake of processed carbohydrates.  She is also concerned today about recurrent right-sided groin pain. Aching pain is located in the right medial thigh and groin area. She reports that symptoms are made worse by walking for prolonged periods. She initially developed symptoms after doing an exercise were she was lifting her leg on the bed. Symptoms seemed to improve but then recurred after some increase physical activity. Pain is made worse by extending and flexing right hip. She has not taken any medication for pain. She denies weakness or numbness in her leg, or loss of bowel or bladder continence.  Outpatient Encounter Prescriptions as of 08/12/2012  Medication Sig Dispense Refill  . amLODipine (NORVASC) 10 MG tablet Take 1 tablet (10 mg total) by mouth daily.  30 tablet  3  . aspirin 81 MG tablet Take 81 mg by mouth daily.      Marland Kitchen glipiZIDE (GLIPIZIDE XL) 10 MG 24 hr tablet Take 1 tablet (10 mg total) by mouth daily.  90 tablet  3  . hydrALAZINE (APRESOLINE) 25 MG tablet Take 1 tablet (25 mg total) by mouth 3 (three) times daily as needed.  30 tablet  3  . levothyroxine (SYNTHROID, LEVOTHROID) 75 MCG tablet Take 75 mcg by mouth daily before breakfast.      . lisinopril (PRINIVIL,ZESTRIL) 40 MG tablet Take 1 tablet (40 mg total) by mouth daily.  90 tablet  3  . metoprolol tartrate  (LOPRESSOR) 25 MG tablet Take 25 mg by mouth 2 (two) times daily.      . saxagliptin HCl (ONGLYZA) 2.5 MG TABS tablet Take 1 tablet (2.5 mg total) by mouth daily.  30 tablet  6   No facility-administered encounter medications on file as of 08/12/2012.   BP 170/98  Pulse 65  Temp(Src) 98 F (36.7 C) (Oral)  Wt 178 lb (80.74 kg)  BMI 31.54 kg/m2  SpO2 98%  Review of Systems  Constitutional: Negative for fever, chills, appetite change, fatigue and unexpected weight change.  HENT: Negative for ear pain, congestion, sore throat, trouble swallowing, neck pain, voice change and sinus pressure.   Eyes: Negative for visual disturbance.  Respiratory: Negative for cough, shortness of breath, wheezing and stridor.   Cardiovascular: Negative for chest pain, palpitations and leg swelling.  Gastrointestinal: Negative for nausea, vomiting, abdominal pain, diarrhea, constipation, blood in stool, abdominal distention and anal bleeding.  Genitourinary: Negative for dysuria and flank pain.  Musculoskeletal: Positive for myalgias. Negative for arthralgias and gait problem.  Skin: Negative for color change and rash.  Neurological: Negative for dizziness and headaches.  Hematological: Negative for adenopathy. Does not bruise/bleed easily.  Psychiatric/Behavioral: Negative for suicidal ideas, sleep disturbance and dysphoric mood. The patient is not nervous/anxious.        Objective:   Physical Exam  Constitutional: She is oriented to person, place, and time. She appears well-developed and well-nourished. No distress.  HENT:  Head: Normocephalic and atraumatic.  Right Ear: External ear normal.  Left Ear: External ear normal.  Nose: Nose normal.  Mouth/Throat: Oropharynx is clear and moist. No oropharyngeal exudate.  Eyes: Conjunctivae are normal. Pupils are equal, round, and reactive to light. Right eye exhibits no discharge. Left eye exhibits no discharge. No scleral icterus.  Neck: Normal range of  motion. Neck supple. No tracheal deviation present. No thyromegaly present.  Cardiovascular: Normal rate, regular rhythm, normal heart sounds and intact distal pulses.  Exam reveals no gallop and no friction rub.   No murmur heard. Pulmonary/Chest: Effort normal and breath sounds normal. No accessory muscle usage. Not tachypneic. No respiratory distress. She has no decreased breath sounds. She has no wheezes. She has no rhonchi. She has no rales. She exhibits no tenderness.  Musculoskeletal: Normal range of motion. She exhibits no edema and no tenderness.       Right hip: She exhibits normal range of motion, normal strength, no tenderness, no bony tenderness and no swelling.  Lymphadenopathy:    She has no cervical adenopathy.  Neurological: She is alert and oriented to person, place, and time. No cranial nerve deficit. She exhibits normal muscle tone. Coordination normal.  Skin: Skin is warm and dry. No rash noted. She is not diaphoretic. No erythema. No pallor.  Psychiatric: She has a normal mood and affect. Her behavior is normal. Judgment and thought content normal.          Assessment & Plan:

## 2012-08-12 NOTE — Assessment & Plan Note (Signed)
Recurrent right groin pain. Suspect psoas strain. However, given persistence of symptoms, would like to set up sports medicine evaluation and PT.Pt would like to hold off for now. Will avoid NSAIDs because of HTN. Tylenol prn pain. Follow up if symptoms not improving or worsening.

## 2012-08-14 LAB — URINE CULTURE: Colony Count: 40000

## 2012-08-15 ENCOUNTER — Telehealth: Payer: Self-pay | Admitting: *Deleted

## 2012-08-15 MED ORDER — SULFAMETHOXAZOLE-TRIMETHOPRIM 800-160 MG PO TABS: 1.0000 | ORAL_TABLET | Freq: Two times a day (BID) | ORAL | Status: DC

## 2012-08-15 NOTE — Telephone Encounter (Signed)
Rx sent to pharmacy and patient is aware 

## 2012-08-15 NOTE — Telephone Encounter (Signed)
Message copied by Theola Sequin on Thu Aug 15, 2012  9:09 AM ------      Message from: Ronna Polio A      Created: Wed Aug 14, 2012  2:16 PM       Can you ask if she is allergic to Bactrim? We can call in Bactrim DS po bid x 5 days. ------

## 2012-09-11 ENCOUNTER — Telehealth: Payer: Self-pay | Admitting: *Deleted

## 2012-09-11 ENCOUNTER — Encounter: Payer: Self-pay | Admitting: Internal Medicine

## 2012-09-11 ENCOUNTER — Ambulatory Visit (INDEPENDENT_AMBULATORY_CARE_PROVIDER_SITE_OTHER): Payer: BC Managed Care – PPO | Admitting: Internal Medicine

## 2012-09-11 VITALS — BP 200/90 | HR 72 | Temp 97.9°F | Wt 181.0 lb

## 2012-09-11 DIAGNOSIS — R29898 Other symptoms and signs involving the musculoskeletal system: Secondary | ICD-10-CM

## 2012-09-11 DIAGNOSIS — R300 Dysuria: Secondary | ICD-10-CM

## 2012-09-11 DIAGNOSIS — I10 Essential (primary) hypertension: Secondary | ICD-10-CM

## 2012-09-11 LAB — POCT URINALYSIS DIPSTICK
Glucose, UA: NEGATIVE
Nitrite, UA: NEGATIVE
Urobilinogen, UA: 0.2

## 2012-09-11 NOTE — Assessment & Plan Note (Signed)
Patient reports persistent bilateral proximal leg pain and weakness.  No other symptoms such as back pain, bowel or bladder incontinence. Exam is normal today.We discussed that most likely cause of symptoms would be related to lower back. Discussed imaging of her back with x-ray and possibly MRI lumbar spine.Discussed potential referral to neurology for evaluation including EMG testing.  Patient would like to limit additional testing as much as possible. Will set up sports medicine evaluation to see if any additional stretching or strengthening exercises may be helpful.

## 2012-09-11 NOTE — Telephone Encounter (Signed)
Would you like a urine culture?  

## 2012-09-11 NOTE — Telephone Encounter (Signed)
Yes, please send for culture.

## 2012-09-11 NOTE — Assessment & Plan Note (Addendum)
BP Readings from Last 3 Encounters:  09/11/12 200/90  08/12/12 170/98  07/31/12 170/72   Blood pressure markedly elevated today however patient reports well-controlled at home. We'll continue to monitor closely. Note that patient is status post renal artery stent, complicated by restenosis and subsequent angioplasty.  Continue current medications. Continue to follow with nephrology. Pt will call if BP consistently >140/90 at home.

## 2012-09-11 NOTE — Assessment & Plan Note (Signed)
Blood noted on urinalysis. Patient symptomatic with dysuria. Will send urine for culture. Will treat empirically with Cipro.

## 2012-09-11 NOTE — Assessment & Plan Note (Signed)
>>  ASSESSMENT AND PLAN FOR HYPERTENSION ASSOCIATED WITH DIABETES (HCC) WRITTEN ON 09/11/2012  6:18 PM BY VANNIE, JENNIFER A, MD  BP Readings from Last 3 Encounters:  09/11/12 200/90  08/12/12 170/98  07/31/12 170/72   Blood pressure markedly elevated today however patient reports well-controlled at home. We'll continue to monitor closely. Note that patient is status post renal artery stent, complicated by restenosis and subsequent angioplasty.  Continue current medications. Continue to follow with nephrology. Pt will call if BP consistently >140/90 at home.

## 2012-09-11 NOTE — Progress Notes (Signed)
Subjective:    Patient ID: Danielle Park, female    DOB: 1950/04/26, 62 y.o.   MRN: [id number]  HPI 62 year old female with history of hypertension, congenital single kidney, renal artery stenosis status post stent placement, diabetes, and chronic leg pain presents for followup. Her primary concern today is bilateral thigh pain and weakness. Symptoms initially began several months ago with pain in her right thigh. She now reports pain in both of her upper thighs and occasional weakness. Symptoms are made worse by increase physical activity such as walking. Symptoms are improved with rest. She has not taken any medication for pain. She has not had any falls. She feels as if her gait is impaired because of weakness on occasion. She denies any back pain. She denies any loss of continence of bowel or bladder. She denies any history of trauma to her back. She denies any symptoms of cramping or pain in her lower legs.  In regards to hypertension, she reports blood pressure has been very well-controlled at home, typically 130/80. She is compliant with medication. She denies any headache, chest pain, palpitations.  Outpatient Encounter Prescriptions as of 09/11/2012  Medication Sig Dispense Refill  . amLODipine (NORVASC) 10 MG tablet Take 1 tablet (10 mg total) by mouth daily.  30 tablet  3  . aspirin 81 MG tablet Take 81 mg by mouth daily.      Marland Kitchen glipiZIDE (GLIPIZIDE XL) 10 MG 24 hr tablet Take 1 tablet (10 mg total) by mouth daily.  90 tablet  3  . hydrALAZINE (APRESOLINE) 25 MG tablet Take 1 tablet (25 mg total) by mouth 3 (three) times daily as needed.  30 tablet  3  . levothyroxine (SYNTHROID, LEVOTHROID) 75 MCG tablet Take 75 mcg by mouth daily before breakfast.      . lisinopril (PRINIVIL,ZESTRIL) 40 MG tablet Take 1 tablet (40 mg total) by mouth daily.  90 tablet  3  . metoprolol tartrate (LOPRESSOR) 25 MG tablet Take 25 mg by mouth 2 (two) times daily.      . saxagliptin HCl (ONGLYZA) 2.5 MG TABS  tablet Take 1 tablet (2.5 mg total) by mouth daily.  30 tablet  6   No facility-administered encounter medications on file as of 09/11/2012.   BP 200/90  Pulse 72  Temp(Src) 97.9 F (36.6 C) (Oral)  Wt 181 lb (82.101 kg)  BMI 32.07 kg/m2  SpO2 97%  Review of Systems  Constitutional: Negative for fever, chills, appetite change, fatigue and unexpected weight change.  HENT: Negative for ear pain, congestion, sore throat, trouble swallowing, neck pain, voice change and sinus pressure.   Eyes: Negative for visual disturbance.  Respiratory: Negative for cough, shortness of breath, wheezing and stridor.   Cardiovascular: Negative for chest pain, palpitations and leg swelling.  Gastrointestinal: Negative for nausea, vomiting, abdominal pain, diarrhea, constipation, blood in stool, abdominal distention and anal bleeding.  Genitourinary: Positive for dysuria and frequency. Negative for urgency, hematuria, flank pain and decreased urine volume.  Musculoskeletal: Positive for myalgias. Negative for arthralgias and gait problem.  Skin: Negative for color change and rash.  Neurological: Positive for weakness (proximal bilateral thighs). Negative for dizziness and headaches.  Hematological: Negative for adenopathy. Does not bruise/bleed easily.  Psychiatric/Behavioral: Negative for suicidal ideas, sleep disturbance and dysphoric mood. The patient is not nervous/anxious.        Objective:   Physical Exam  Constitutional: She is oriented to person, place, and time. She appears well-developed and well-nourished. No distress.  HENT:  Head: Normocephalic and atraumatic.  Right Ear: External ear normal.  Left Ear: External ear normal.  Nose: Nose normal.  Mouth/Throat: Oropharynx is clear and moist. No oropharyngeal exudate.  Eyes: Conjunctivae are normal. Pupils are equal, round, and reactive to light. Right eye exhibits no discharge. Left eye exhibits no discharge. No scleral icterus.  Neck: Normal  range of motion. Neck supple. No tracheal deviation present. No thyromegaly present.  Cardiovascular: Normal rate, regular rhythm, normal heart sounds and intact distal pulses.  Exam reveals no gallop and no friction rub.   No murmur heard. Pulmonary/Chest: Effort normal and breath sounds normal. No accessory muscle usage. Not tachypneic. No respiratory distress. She has no decreased breath sounds. She has no wheezes. She has no rhonchi. She has no rales. She exhibits no tenderness.  Musculoskeletal: Normal range of motion. She exhibits no edema and no tenderness.  Lymphadenopathy:    She has no cervical adenopathy.  Neurological: She is alert and oriented to person, place, and time. She displays no atrophy and no tremor. No cranial nerve deficit or sensory deficit. She exhibits normal muscle tone. Coordination and gait normal.  Reflex Scores:      Patellar reflexes are 2+ on the right side and 2+ on the left side. Skin: Skin is warm and dry. No rash noted. She is not diaphoretic. No erythema. No pallor.  Psychiatric: She has a normal mood and affect. Her behavior is normal. Judgment and thought content normal.          Assessment & Plan:

## 2012-09-13 ENCOUNTER — Telehealth: Payer: Self-pay | Admitting: *Deleted

## 2012-09-13 ENCOUNTER — Other Ambulatory Visit (INDEPENDENT_AMBULATORY_CARE_PROVIDER_SITE_OTHER): Payer: BC Managed Care – PPO

## 2012-09-13 DIAGNOSIS — R319 Hematuria, unspecified: Secondary | ICD-10-CM

## 2012-09-13 LAB — POCT URINALYSIS DIPSTICK
Bilirubin, UA: NEGATIVE
Nitrite, UA: NEGATIVE
pH, UA: 5

## 2012-09-13 LAB — URINE CULTURE: Organism ID, Bacteria: NO GROWTH

## 2012-09-13 MED ORDER — CIPROFLOXACIN HCL 500 MG PO TABS: 500.0000 mg | ORAL_TABLET | Freq: Two times a day (BID) | ORAL | Status: DC

## 2012-09-13 NOTE — Telephone Encounter (Signed)
Would you like a culture?

## 2012-09-13 NOTE — Telephone Encounter (Signed)
Message copied by Theola Sequin on Fri Sep 13, 2012  3:57 PM ------      Message from: Ronna Polio A      Created: Wed Sep 11, 2012  4:56 PM       Please send urine for culture. Let pt know that urine showed blood but no other signs of infection. We should call in Cipro 500mg  po bid x 7 days to start until culture is back. ------

## 2012-09-13 NOTE — Telephone Encounter (Signed)
Yes, please.

## 2012-09-13 NOTE — Telephone Encounter (Signed)
Rx sent to pharmacy   

## 2012-09-15 LAB — URINE CULTURE

## 2012-09-16 ENCOUNTER — Encounter: Payer: Self-pay | Admitting: Family Medicine

## 2012-09-16 ENCOUNTER — Ambulatory Visit (INDEPENDENT_AMBULATORY_CARE_PROVIDER_SITE_OTHER)
Admission: RE | Admit: 2012-09-16 | Discharge: 2012-09-16 | Disposition: A | Discharge status: Home / Self Care | Payer: BC Managed Care – PPO | Source: Ambulatory Visit | Attending: Family Medicine | Admitting: Family Medicine

## 2012-09-16 ENCOUNTER — Ambulatory Visit (INDEPENDENT_AMBULATORY_CARE_PROVIDER_SITE_OTHER): Payer: BC Managed Care – PPO | Admitting: Family Medicine

## 2012-09-16 ENCOUNTER — Other Ambulatory Visit: Payer: Self-pay | Admitting: Internal Medicine

## 2012-09-16 VITALS — BP 210/100 | HR 66 | Temp 97.9°F | Ht 63.0 in | Wt 178.0 lb

## 2012-09-16 DIAGNOSIS — M25551 Pain in right hip: Secondary | ICD-10-CM

## 2012-09-16 DIAGNOSIS — M25559 Pain in unspecified hip: Secondary | ICD-10-CM

## 2012-09-16 DIAGNOSIS — I10 Essential (primary) hypertension: Secondary | ICD-10-CM

## 2012-09-16 DIAGNOSIS — R319 Hematuria, unspecified: Secondary | ICD-10-CM

## 2012-09-16 DIAGNOSIS — I160 Hypertensive urgency: Secondary | ICD-10-CM

## 2012-09-16 NOTE — Progress Notes (Signed)
Date:  09/16/2012   Name:  Danielle Park   DOB:  [birth year]   MRN:  [id number] Gender: female Age: 62 y.o.  Primary Physician: Wynona Dove, MD  Dear Dr. Dan Humphreys,  Thank you for having me see Danielle Park in consultation today at [facility] at [facility] for her problem with leg weakness and right sided hip pain.  As you may recall, she is a 62 y.o. year old female with a history of an injury in 02/2012, where she felt like she pulled a groin muscle while she was attempting to do some exercises in bed. Now she feels  Like she primarily has pain on the right anterior groin, mostly deep, and also has some occaisional posterior buttocks pain.  She denies any back pain, numbness, parasthesias, and bowel or bladder incontinence.   No history of hip or leg fracture, spinal fracture, or orthopedic surgery.  Stopping or standing, or sitting down will make it feel better.   Of note, BP 210/110 in my office today. 190/90 on my repeat. Has some RAS - put graft in 05/27/2012.  For work: runs a [employer].    Stopping or standing, or sitting down will make it feel better.    Past Medical History  Diagnosis Date  . Chicken pox   . Diabetes mellitus   . Hypertension   . UTI (urinary tract infection)   . Congenital single kidney   . 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 Cesarean section    . Vaginal delivery    . Renal artery stent  2014    Dr. Wyn Quaker at [facility]    History   Social History  . Marital Status: Married    Spouse Name: N/A    Number of Children: N/A  . Years of Education: N/A   Social History Main Topics  . Smoking status: Never Smoker   . Smokeless tobacco: None  . Alcohol Use: No  . Drug Use: None  . Sexually Active: None   Other Topics Concern  . None   Social History Narrative   Lives in [place] with husband. 1 dog      Work - Architect   Diet - regular diet   Exercise - occasional, very active at work    Family History  Problem Relation Age of Onset  . Hypertension Mother   . Diabetes Mother   . Heart disease Mother     s/p CABG x 2  . Hypertension Father   . Cancer Father     lung   . Heart disease Other   . Diabetes Other   . Diabetes Sister   . Heart disease Sister   . Heart disease Sister   . Cancer Sister     colon and ovary?    Medications and Allergies reviewed  Outpatient Prescriptions Prior to Visit  Medication Sig Dispense Refill  . amLODipine (NORVASC) 10 MG tablet Take 1 tablet (10 mg total) by mouth daily.  30 tablet  3  . aspirin 81 MG tablet Take 81 mg by mouth daily.      . ciprofloxacin (CIPRO) 500 MG tablet Take 1 tablet (500 mg total) by mouth 2 (two) times daily.  14 tablet  0  . glipiZIDE (GLIPIZIDE XL) 10 MG 24 hr tablet Take 1 tablet (10 mg total) by mouth daily.  90 tablet  3  . hydrALAZINE (APRESOLINE) 25 MG tablet Take 1 tablet (25 mg total) by mouth 3 (three) times daily as needed.  30 tablet  3  . levothyroxine (SYNTHROID, LEVOTHROID) 75 MCG tablet Take 75 mcg by mouth daily before breakfast.      . lisinopril (PRINIVIL,ZESTRIL) 40 MG tablet Take 1 tablet (40 mg total) by mouth daily.  90 tablet  3  . metoprolol tartrate (LOPRESSOR) 25 MG tablet Take 25 mg by mouth 2 (two) times daily.      . saxagliptin HCl (ONGLYZA) 2.5 MG TABS tablet Take 1 tablet (2.5 mg total) by mouth daily.  30 tablet  6   No facility-administered medications prior to visit.    Review of Systems:    GEN: No fevers, chills. Nontoxic. Primarily MSK c/o today. MSK: Detailed in the HPI GI: tolerating PO intake without difficulty Neuro: No numbness, parasthesias, or tingling associated. Otherwise the pertinent positives of the ROS are noted above.    Physical Examination: Filed Vitals:   09/16/12 0840  BP: 210/100  Pulse: 66  Temp: 97.9 F (36.6 C)  TempSrc: Oral  Height: 5\' 3"  (1.6 m)    Weight: 178 lb (80.74 kg)  SpO2: 99%      GEN: Well-developed,well-nourished,in no acute distress; alert,appropriate and cooperative throughout examination HEENT: Normocephalic and atraumatic without obvious abnormalities. Ears, externally no deformities PULM: Breathing comfortably in no respiratory distress EXT: No clubbing, cyanosis, or edema PSYCH: Normally interactive. Cooperative during the interview. Pleasant. Friendly and conversant. Not anxious or depressed appearing. Normal, full affect.  Range of motion at  the waist: Flexion: normal Extension: normal Lateral bending: normal Rotation: all normal  No echymosis or edema Rises to examination table with no difficulty Gait: non antalgic  Inspection/Deformity: N Paraspinus Tenderness: none  B Ankle Dorsiflexion (L5,4): 5/5 B Great Toe Dorsiflexion (L5,4): 5/5 Heel Walk (L5): WNL Toe Walk (S1): WNL Rise/Squat (L4): WNL  SENSORY B Medial Foot (L4): WNL B Dorsum (L5): WNL B Lateral (S1): WNL Light Touch: WNL  REFLEXES Knee (L4): 2+ Ankle (S1): 2+  B SLR, seated: neg B SLR, supine: neg B Log Roll: neg B Sciatic Notch: NT   HIP EXAM: SIDE: B ROM: Abduction, Flexion, Internal and External range of motion: full Pain with terminal IROM and EROM: no GTB: NT SLR: NEG Knees: No effusion FABER: NT REVERSE FABER: NT, neg Piriformis: NT at direct palpation Str: flexion: 5/5 abduction: 5/5 adduction: 4++/5 - mildly tender Chaperoned exam, area of adductor insertion palpated with mild / mod tenderness in area of patient's primary concern for maximal pain Strength testing non-tender   Dg Hip Complete Right  09/16/2012   *RADIOLOGY REPORT*  Clinical Data: 62 year old female right hip pain.  Acute pain.  RIGHT HIP - COMPLETE 2+ VIEW  Comparison: None.  Findings: Femoral heads normally located.  Joint spaces are preserved.  Pelvis intact.  Sacral ala and SI joints within normal limits.  Degenerative sclerosis at the  pubic symphysis.  Proximal right femur intact.  Grossly intact proximal left femur.  IMPRESSION: No acute osseous abnormality identified about the right hip or pelvis.   Original Report Authenticated By: Erskine Speed, M.D.    --- agree, no significant OA.  Hannah Beat, MD 09/17/2012, 3:08 PM   Assessment and Plan:  Impression:  Hip pain, acute, right - Plan: DG Hip Complete Right  Hypertensive urgency   Probable original insertional injury in 02/2012, most likely at any of the adductors, gracilis,  or pectineus. Cannot rule out degree of referred pain from spine.  Recommendations: Reviewed [facility] hip rehab program with patient. Formal PT also reasonable. Check plain films, eval for OA and osteitis pubis   We will see the patient back in 6 weeks.  Thank you for having [place] see Danielle Park in consultation.  Feel free to contact me with any questions.  Signed, Elpidio Galea. Arayah Krouse, MD, CAQ Sports Medicine [employer] at [facility] [address] [place], [place] [address] Phone: [phone] Fax: [phone]

## 2012-10-17 DIAGNOSIS — R319 Hematuria, unspecified: Secondary | ICD-10-CM | POA: Insufficient documentation

## 2012-10-17 DIAGNOSIS — Q600 Renal agenesis, unilateral: Secondary | ICD-10-CM | POA: Insufficient documentation

## 2012-10-22 ENCOUNTER — Ambulatory Visit: Payer: BC Managed Care – PPO | Admitting: Internal Medicine

## 2012-10-24 ENCOUNTER — Encounter: Payer: Self-pay | Admitting: Nephrology

## 2012-11-11 ENCOUNTER — Ambulatory Visit (INDEPENDENT_AMBULATORY_CARE_PROVIDER_SITE_OTHER): Payer: BC Managed Care – PPO | Admitting: Internal Medicine

## 2012-11-11 ENCOUNTER — Encounter: Payer: Self-pay | Admitting: Internal Medicine

## 2012-11-11 VITALS — BP 172/90 | HR 74 | Temp 98.5°F | Wt 186.0 lb

## 2012-11-11 DIAGNOSIS — I10 Essential (primary) hypertension: Secondary | ICD-10-CM

## 2012-11-11 DIAGNOSIS — IMO0001 Reserved for inherently not codable concepts without codable children: Secondary | ICD-10-CM

## 2012-11-11 DIAGNOSIS — E1165 Type 2 diabetes mellitus with hyperglycemia: Secondary | ICD-10-CM

## 2012-11-11 DIAGNOSIS — E039 Hypothyroidism, unspecified: Secondary | ICD-10-CM

## 2012-11-11 LAB — HM DIABETES FOOT EXAM: HM Diabetic Foot Exam: NORMAL

## 2012-11-11 MED ORDER — SAXAGLIPTIN HCL 5 MG PO TABS: 5.0000 mg | ORAL_TABLET | Freq: Every day | ORAL | Status: DC

## 2012-11-11 NOTE — Progress Notes (Signed)
Subjective:    Patient ID: Danielle Park, female    DOB: [birth year], 62 y.o.   MRN: [id number]  HPI 62 year old female with history of diabetes, hypertension, renal artery stenosis status post stent placement presents for followup. In regards to diabetes, most blood sugars are still near 200. She reports full compliance with her medication. She has not had any blood sugars greater than 300 or less than 70.  In regards to hypertension, she reports blood pressures have been well controlled with most blood pressures near 130/70. She denies any chest pain, shortness of breath, palpitations. She reports full compliance with medication.  In regards to chronic right thigh pain she reports that symptoms have much improved. She is no longer having discomfort with standing or walking.  Outpatient Encounter Prescriptions as of 11/11/2012  Medication Sig Dispense Refill  . amLODipine (NORVASC) 10 MG tablet Take 1 tablet (10 mg total) by mouth daily.  30 tablet  3  . aspirin 81 MG tablet Take 81 mg by mouth daily.      Marland Kitchen glipiZIDE (GLIPIZIDE XL) 10 MG 24 hr tablet Take 1 tablet (10 mg total) by mouth daily.  90 tablet  3  . levothyroxine (SYNTHROID, LEVOTHROID) 75 MCG tablet Take 75 mcg by mouth daily before breakfast.      . lisinopril (PRINIVIL,ZESTRIL) 40 MG tablet Take 1 tablet (40 mg total) by mouth daily.  90 tablet  3  . metoprolol tartrate (LOPRESSOR) 25 MG tablet Take 25 mg by mouth 2 (two) times daily.      . [DISCONTINUED] ciprofloxacin (CIPRO) 500 MG tablet Take 1 tablet (500 mg total) by mouth 2 (two) times daily.  14 tablet  0  . [DISCONTINUED] saxagliptin HCl (ONGLYZA) 2.5 MG TABS tablet Take 1 tablet (2.5 mg total) by mouth daily.  30 tablet  6  . hydrALAZINE (APRESOLINE) 25 MG tablet Take 1 tablet (25 mg total) by mouth 3 (three) times daily as needed.  30 tablet  3  . saxagliptin HCl (ONGLYZA) 5 MG TABS tablet Take 1 tablet (5 mg total) by mouth daily.  30 tablet  6   No  facility-administered encounter medications on file as of 11/11/2012.   BP 172/90  Pulse 74  Temp(Src) 98.5 F (36.9 C) (Oral)  Wt 186 lb (84.369 kg)  BMI 32.96 kg/m2  SpO2 97%  Review of Systems  Constitutional: Negative for fever, chills, appetite change, fatigue and unexpected weight change.  HENT: Negative for ear pain, congestion, sore throat, trouble swallowing, neck pain, voice change and sinus pressure.   Eyes: Negative for visual disturbance.  Respiratory: Negative for cough, shortness of breath, wheezing and stridor.   Cardiovascular: Negative for chest pain, palpitations and leg swelling.  Gastrointestinal: Negative for nausea, vomiting, abdominal pain, diarrhea, constipation, blood in stool, abdominal distention and anal bleeding.  Genitourinary: Negative for dysuria and flank pain.  Musculoskeletal: Negative for myalgias, arthralgias and gait problem.  Skin: Negative for color change and rash.  Neurological: Negative for dizziness and headaches.  Hematological: Negative for adenopathy. Does not bruise/bleed easily.  Psychiatric/Behavioral: Negative for suicidal ideas, sleep disturbance and dysphoric mood. The patient is not nervous/anxious.        Objective:   Physical Exam  Constitutional: She is oriented to person, place, and time. She appears well-developed and well-nourished. No distress.  HENT:  Head: Normocephalic and atraumatic.  Right Ear: External ear normal.  Left Ear: External ear normal.  Nose: Nose normal.  Mouth/Throat: Oropharynx is clear and moist.  No oropharyngeal exudate.  Eyes: Conjunctivae are normal. Pupils are equal, round, and reactive to light. Right eye exhibits no discharge. Left eye exhibits no discharge. No scleral icterus.  Neck: Normal range of motion. Neck supple. No tracheal deviation present. No thyromegaly present.  Cardiovascular: Normal rate, regular rhythm, normal heart sounds and intact distal pulses.  Exam reveals no gallop and  no friction rub.   No murmur heard. Pulmonary/Chest: Effort normal and breath sounds normal. No accessory muscle usage. Not tachypneic. No respiratory distress. She has no decreased breath sounds. She has no wheezes. She has no rhonchi. She has no rales. She exhibits no tenderness.  Musculoskeletal: Normal range of motion. She exhibits no edema and no tenderness.  Lymphadenopathy:    She has no cervical adenopathy.  Neurological: She is alert and oriented to person, place, and time. No cranial nerve deficit. She exhibits normal muscle tone. Coordination normal.  Skin: Skin is warm and dry. No rash noted. She is not diaphoretic. No erythema. No pallor.  Psychiatric: She has a normal mood and affect. Her behavior is normal. Judgment and thought content normal.          Assessment & Plan:

## 2012-11-11 NOTE — Assessment & Plan Note (Signed)
>>  ASSESSMENT AND PLAN FOR CONTROLLED DIABETES MELLITUS TYPE 2 WITH COMPLICATIONS ([facility]CC) WRITTEN ON 11/11/2012  7:32 PM BY VANNIE, JENNIFER A, MD  Will check A1c with labs today. BG continue to be elevated by pt report. Will increase Onglyza to 5mg  daily. Discussed potentially adding insulin , but will hold off until additional labs back.

## 2012-11-11 NOTE — Assessment & Plan Note (Signed)
Will check A1c with labs today. BG continue to be elevated by pt report. Will increase Onglyza to 5mg  daily. Discussed potentially adding insulin, but will hold off until additional labs back.

## 2012-11-11 NOTE — Assessment & Plan Note (Signed)
Will check TSH with labs today. 

## 2012-11-11 NOTE — Assessment & Plan Note (Signed)
BP Readings from Last 3 Encounters:  11/11/12 172/90  09/16/12 210/100  09/11/12 200/90   BP elevated, but improved compared to previous. Much better control at home, typically 140/70s. Will continue current medication. Continue to follow with nephrologist.

## 2012-11-11 NOTE — Assessment & Plan Note (Signed)
>>  ASSESSMENT AND PLAN FOR HYPERTENSION ASSOCIATED WITH DIABETES (HCC) WRITTEN ON 11/11/2012  7:32 PM BY VANNIE, JENNIFER A, MD  BP Readings from Last 3 Encounters:  11/11/12 172/90  09/16/12 210/100  09/11/12 200/90   BP elevated, but improved compared to previous. Much better control at home, typically 140/70s. Will continue current medication. Continue to follow with nephrologist.

## 2012-11-12 LAB — COMPREHENSIVE METABOLIC PANEL
ALT: 13 U/L (ref 0–35)
Albumin: 4 g/dL (ref 3.5–5.2)
Alkaline Phosphatase: 110 U/L (ref 39–117)
CO2: 26 mEq/L (ref 19–32)
Glucose, Bld: 180 mg/dL — ABNORMAL HIGH (ref 70–99)
Potassium: 4.5 mEq/L (ref 3.5–5.1)
Sodium: 133 mEq/L — ABNORMAL LOW (ref 135–145)
Total Protein: 8 g/dL (ref 6.0–8.3)

## 2012-11-12 LAB — HEMOGLOBIN A1C: Hgb A1c MFr Bld: 8.7 % — ABNORMAL HIGH (ref 4.6–6.5)

## 2012-11-12 LAB — MICROALBUMIN / CREATININE URINE RATIO: Microalb Creat Ratio: 3.1 mg/g (ref 0.0–30.0)

## 2012-11-19 ENCOUNTER — Encounter: Payer: Self-pay | Admitting: *Deleted

## 2012-11-26 ENCOUNTER — Other Ambulatory Visit: Payer: Self-pay | Admitting: Internal Medicine

## 2013-01-30 ENCOUNTER — Other Ambulatory Visit: Payer: Self-pay

## 2013-02-11 ENCOUNTER — Encounter: Payer: Self-pay | Admitting: Internal Medicine

## 2013-02-11 ENCOUNTER — Ambulatory Visit (INDEPENDENT_AMBULATORY_CARE_PROVIDER_SITE_OTHER): Payer: BC Managed Care – PPO | Admitting: Internal Medicine

## 2013-02-11 VITALS — BP 174/90 | HR 68 | Temp 97.5°F | Wt 185.0 lb

## 2013-02-11 DIAGNOSIS — IMO0001 Reserved for inherently not codable concepts without codable children: Secondary | ICD-10-CM

## 2013-02-11 DIAGNOSIS — I10 Essential (primary) hypertension: Secondary | ICD-10-CM

## 2013-02-11 DIAGNOSIS — E1165 Type 2 diabetes mellitus with hyperglycemia: Secondary | ICD-10-CM

## 2013-02-11 MED ORDER — INSULIN DETEMIR 100 UNIT/ML FLEXPEN: 10.0000 [IU] | PEN_INJECTOR | Freq: Every day | SUBCUTANEOUS | Status: DC

## 2013-02-11 NOTE — Assessment & Plan Note (Signed)
>>  ASSESSMENT AND PLAN FOR CONTROLLED DIABETES MELLITUS TYPE 2 WITH COMPLICATIONS (HCC) WRITTEN ON 02/11/2013 10:24 PM BY WALKER, JENNIFER A, MD  Blood sugars poorly controlled on current medications. Pt unable to afford Onglyza.  Will stop Onglyza. Will continue Glipizide . Will start Levemir  10units daily with plan to titrate up for improved glucose control. Pt will check BG 2 times daily and bring to visit in 4 weeks. She will call immediately if any BG <70.

## 2013-02-11 NOTE — Patient Instructions (Signed)
Stop Onglyza.  Start Levemir 10units daily. Call if any blood sugars less than 70.  Follow up 4 weeks.

## 2013-02-11 NOTE — Assessment & Plan Note (Signed)
BP Readings from Last 3 Encounters:  02/11/13 174/90  11/11/12 172/90  09/16/12 210/100   BP elevated in clinic, however pt reports better controlled at home. Hydralazine and Furosemide recently added by nephrologist. Will continue to monitor. Will check renal function with labs today.

## 2013-02-11 NOTE — Assessment & Plan Note (Signed)
Blood sugars poorly controlled on current medications. Pt unable to afford Onglyza.  Will stop Onglyza. Will continue Glipizide. Will start Levemir 10units daily with plan to titrate up for improved glucose control. Pt will check BG 2 times daily and bring to visit in 4 weeks. She will call immediately if any BG <70.

## 2013-02-11 NOTE — Assessment & Plan Note (Signed)
>>  ASSESSMENT AND PLAN FOR HYPERTENSION ASSOCIATED WITH DIABETES (HCC) WRITTEN ON 02/11/2013 10:27 PM BY VANNIE, JENNIFER A, MD  BP Readings from Last 3 Encounters:  02/11/13 174/90  11/11/12 172/90  09/16/12 210/100   BP elevated in clinic, however pt reports better controlled at home. Hydralazine  and Furosemide  recently added by nephrologist. Will continue to monitor. Will check renal function with labs today.

## 2013-02-11 NOTE — Progress Notes (Signed)
Subjective:    Patient ID: Danielle Park, female    DOB: Oct 20, 1950, 62 y.o.   MRN: [id number]  HPI 62YO female with congenital single kidney, diabetes, hypothyroidism, hypertension presents for follow up. She is generally feeling well.  She reports BP has been better controlled at home, typically <140/90. She denies headache, palpitations, chest pain. In regards to DM, she notes BG mostly near 200-250. She is compliant with medications. She cannot continue to afford Onglyza as this medication is >300 dollars monthly for her.  Outpatient Encounter Prescriptions as of 02/11/2013  Medication Sig  . amLODipine (NORVASC) 10 MG tablet TAKE ONE TABLET BY MOUTH ONCE DAILY  . aspirin 81 MG tablet Take 81 mg by mouth daily.  . furosemide (LASIX) 20 MG tablet Take 20 mg by mouth daily.  Marland Kitchen glipiZIDE (GLIPIZIDE XL) 10 MG 24 hr tablet Take 1 tablet (10 mg total) by mouth daily.  . hydrALAZINE (APRESOLINE) 25 MG tablet Take 25 mg by mouth 2 (two) times daily as needed.  Marland Kitchen levothyroxine (SYNTHROID, LEVOTHROID) 75 MCG tablet Take 75 mcg by mouth daily before breakfast.  . lisinopril (PRINIVIL,ZESTRIL) 40 MG tablet Take 1 tablet (40 mg total) by mouth daily.  . metoprolol tartrate (LOPRESSOR) 25 MG tablet Take 25 mg by mouth 2 (two) times daily.  . saxagliptin HCl (ONGLYZA) 5 MG TABS tablet Take 1 tablet (5 mg total) by mouth daily.   BP 174/90  Pulse 68  Temp(Src) 97.5 F (36.4 C) (Oral)  Wt 185 lb (83.915 kg)  SpO2 97%  Review of Systems  Constitutional: Negative for fever, chills, appetite change, fatigue and unexpected weight change.  HENT: Negative for congestion, ear pain, sinus pressure, sore throat, trouble swallowing and voice change.   Eyes: Negative for visual disturbance.  Respiratory: Negative for cough, shortness of breath, wheezing and stridor.   Cardiovascular: Negative for chest pain, palpitations and leg swelling.  Gastrointestinal: Negative for nausea, vomiting, abdominal pain,  diarrhea, constipation, blood in stool, abdominal distention and anal bleeding.  Genitourinary: Negative for dysuria and flank pain.  Musculoskeletal: Negative for arthralgias, gait problem, myalgias and neck pain.  Skin: Negative for color change and rash.  Neurological: Negative for dizziness and headaches.  Hematological: Negative for adenopathy. Does not bruise/bleed easily.  Psychiatric/Behavioral: Negative for suicidal ideas, sleep disturbance and dysphoric mood. The patient is not nervous/anxious.        Objective:   Physical Exam  Constitutional: She is oriented to person, place, and time. She appears well-developed and well-nourished. No distress.  HENT:  Head: Normocephalic and atraumatic.  Right Ear: External ear normal.  Left Ear: External ear normal.  Nose: Nose normal.  Mouth/Throat: Oropharynx is clear and moist. No oropharyngeal exudate.  Eyes: Conjunctivae are normal. Pupils are equal, round, and reactive to light. Right eye exhibits no discharge. Left eye exhibits no discharge. No scleral icterus.  Neck: Normal range of motion. Neck supple. No tracheal deviation present. No thyromegaly present.  Cardiovascular: Normal rate, regular rhythm, normal heart sounds and intact distal pulses.  Exam reveals no gallop and no friction rub.   No murmur heard. Pulmonary/Chest: Effort normal and breath sounds normal. No accessory muscle usage. Not tachypneic. No respiratory distress. She has no decreased breath sounds. She has no wheezes. She has no rhonchi. She has no rales. She exhibits no tenderness.  Musculoskeletal: Normal range of motion. She exhibits no edema and no tenderness.  Lymphadenopathy:    She has no cervical adenopathy.  Neurological: She is alert  and oriented to person, place, and time. No cranial nerve deficit. She exhibits normal muscle tone. Coordination normal.  Skin: Skin is warm and dry. No rash noted. She is not diaphoretic. No erythema. No pallor.   Psychiatric: She has a normal mood and affect. Her behavior is normal. Judgment and thought content normal.          Assessment & Plan:

## 2013-02-11 NOTE — Progress Notes (Signed)
Pre-visit discussion using our clinic review tool. No additional management support is needed unless otherwise documented below in the visit note.  

## 2013-02-12 LAB — COMPREHENSIVE METABOLIC PANEL
ALT: 12 U/L (ref 0–35)
AST: 23 U/L (ref 0–37)
Albumin: 4.3 g/dL (ref 3.5–5.2)
CO2: 28 mEq/L (ref 19–32)
Calcium: 9.6 mg/dL (ref 8.4–10.5)
Chloride: 100 mEq/L (ref 96–112)
Creatinine, Ser: 1 mg/dL (ref 0.4–1.2)
GFR: 59.69 mL/min — ABNORMAL LOW (ref >60.00)
Potassium: 4.7 mEq/L (ref 3.5–5.1)
Total Protein: 8.2 g/dL (ref 6.0–8.3)

## 2013-02-12 LAB — MICROALBUMIN / CREATININE URINE RATIO: Creatinine,U: 42.6 mg/dL

## 2013-03-11 ENCOUNTER — Ambulatory Visit (INDEPENDENT_AMBULATORY_CARE_PROVIDER_SITE_OTHER): Payer: BC Managed Care – PPO | Admitting: Internal Medicine

## 2013-03-11 ENCOUNTER — Encounter: Payer: Self-pay | Admitting: Internal Medicine

## 2013-03-11 VITALS — BP 178/90 | HR 72 | Temp 97.9°F | Wt 187.0 lb

## 2013-03-11 DIAGNOSIS — I10 Essential (primary) hypertension: Secondary | ICD-10-CM

## 2013-03-11 DIAGNOSIS — E1165 Type 2 diabetes mellitus with hyperglycemia: Secondary | ICD-10-CM

## 2013-03-11 MED ORDER — INSULIN PEN NEEDLE 32G X 6 MM MISC: Status: DC

## 2013-03-11 MED ORDER — INSULIN DETEMIR 100 UNIT/ML FLEXPEN: 15.0000 [IU] | PEN_INJECTOR | Freq: Every day | SUBCUTANEOUS | Status: DC

## 2013-03-11 MED ORDER — LEVOTHYROXINE SODIUM 75 MCG PO TABS: 75.0000 ug | ORAL_TABLET | Freq: Every day | ORAL | Status: DC

## 2013-03-11 NOTE — Patient Instructions (Signed)
Increase Levemir dose to 15units daily. Call or email with blood sugar readings.

## 2013-03-11 NOTE — Progress Notes (Signed)
Pre-visit discussion using our clinic review tool. No additional management support is needed unless otherwise documented below in the visit note.  

## 2013-03-12 NOTE — Assessment & Plan Note (Signed)
>>  ASSESSMENT AND PLAN FOR HYPERTENSION ASSOCIATED WITH DIABETES (HCC) WRITTEN ON 03/12/2013 12:40 PM BY VANNIE, JENNIFER A, MD  BP Readings from Last 3 Encounters:  03/11/13 178/90  02/11/13 174/90  11/11/12 172/90   Blood pressure continues to be elevated here but is much better controlled at home. For now, we'll continue current medications. Plan to recheck renal function in 2 months.

## 2013-03-12 NOTE — Assessment & Plan Note (Signed)
BP Readings from Last 3 Encounters:  03/11/13 178/90  02/11/13 174/90  11/11/12 172/90   Blood pressure continues to be elevated here but is much better controlled at home. For now, we'll continue current medications. Plan to recheck renal function in 2 months.

## 2013-03-12 NOTE — Assessment & Plan Note (Signed)
>>  ASSESSMENT AND PLAN FOR CONTROLLED DIABETES MELLITUS TYPE 2 WITH COMPLICATIONS (HCC) WRITTEN ON 03/12/2013 12:39 PM BY WALKER, JENNIFER A, MD  Blood sugars continue to be elevated. We will plan to titrate up Levemir  dosing. She will increase Levemir  to 15 units daily. She will call or e-mail next week with blood sugar readings. We will gradually titrate up dose with a goal of getting all of her blood sugars closer to 150. Followup in 2 months.

## 2013-03-12 NOTE — Assessment & Plan Note (Signed)
Blood sugars continue to be elevated. We will plan to titrate up Levemir dosing. She will increase Levemir to 15 units daily. She will call or e-mail next week with blood sugar readings. We will gradually titrate up dose with a goal of getting all of her blood sugars closer to 150. Followup in 2 months.

## 2013-03-12 NOTE — Progress Notes (Signed)
Subjective:    Patient ID: Danielle Park, female    DOB: 1950/07/28, 62 y.o.   MRN: [id number]  HPI 62 year old female with history of hypertension, diabetes, renal artery stenosis status post stent placement presents for followup. She reports that blood sugars have been elevated, mostly near 200. She has been compliant with her Levemir. She has not had any low sugars less than 70. She has had a couple of sugars greater than 300. She is trying to limit intake of sugars in her diet.  In regards to blood pressure, most blood pressure at home has been 120-130/70-80. She is compliant with medication. No chest pain, shortness of breath, palpitations, dyspnea.  Outpatient Encounter Prescriptions as of 03/11/2013  Medication Sig  . amLODipine (NORVASC) 10 MG tablet TAKE ONE TABLET BY MOUTH ONCE DAILY  . aspirin 81 MG tablet Take 81 mg by mouth daily.  . furosemide (LASIX) 20 MG tablet Take 20 mg by mouth daily.  Marland Kitchen glipiZIDE (GLIPIZIDE XL) 10 MG 24 hr tablet Take 1 tablet (10 mg total) by mouth daily.  . hydrALAZINE (APRESOLINE) 25 MG tablet Take 25 mg by mouth 2 (two) times daily as needed.  . Insulin Detemir 100 UNIT/ML SOPN Inject 15 Units into the skin daily.  Marland Kitchen levothyroxine (SYNTHROID, LEVOTHROID) 75 MCG tablet Take 1 tablet (75 mcg total) by mouth daily before breakfast.  . lisinopril (PRINIVIL,ZESTRIL) 40 MG tablet Take 1 tablet (40 mg total) by mouth daily.  . metoprolol tartrate (LOPRESSOR) 25 MG tablet Take 25 mg by mouth 2 (two) times daily.  . [DISCONTINUED] Insulin Detemir 100 UNIT/ML SOPN Inject 10 Units into the skin daily.  . [DISCONTINUED] levothyroxine (SYNTHROID, LEVOTHROID) 75 MCG tablet Take 75 mcg by mouth daily before breakfast.  . Insulin Pen Needle 32G X 6 MM MISC Use as directed with Levemir pen  . [DISCONTINUED] saxagliptin HCl (ONGLYZA) 5 MG TABS tablet Take 1 tablet (5 mg total) by mouth daily.   BP 178/90  Pulse 72  Temp(Src) 97.9 F (36.6 C) (Oral)  Wt 187 lb  (84.823 kg)  SpO2 97%  Review of Systems  Constitutional: Negative for fever, chills, appetite change, fatigue and unexpected weight change.  HENT: Negative for congestion, ear pain, sinus pressure, sore throat, trouble swallowing and voice change.   Eyes: Negative for visual disturbance.  Respiratory: Negative for cough, shortness of breath, wheezing and stridor.   Cardiovascular: Negative for chest pain, palpitations and leg swelling.  Gastrointestinal: Negative for nausea, vomiting, abdominal pain, diarrhea, constipation, blood in stool, abdominal distention and anal bleeding.  Genitourinary: Negative for dysuria and flank pain.  Musculoskeletal: Negative for arthralgias, gait problem, myalgias and neck pain.  Skin: Negative for color change and rash.  Neurological: Negative for dizziness and headaches.  Hematological: Negative for adenopathy. Does not bruise/bleed easily.  Psychiatric/Behavioral: Negative for suicidal ideas, sleep disturbance and dysphoric mood. The patient is not nervous/anxious.        Objective:   Physical Exam  Constitutional: She is oriented to person, place, and time. She appears well-developed and well-nourished. No distress.  HENT:  Head: Normocephalic and atraumatic.  Right Ear: External ear normal.  Left Ear: External ear normal.  Nose: Nose normal.  Mouth/Throat: Oropharynx is clear and moist. No oropharyngeal exudate.  Eyes: Conjunctivae are normal. Pupils are equal, round, and reactive to light. Right eye exhibits no discharge. Left eye exhibits no discharge. No scleral icterus.  Neck: Normal range of motion. Neck supple. No tracheal deviation present. No thyromegaly  present.  Cardiovascular: Normal rate, regular rhythm, normal heart sounds and intact distal pulses.  Exam reveals no gallop and no friction rub.   No murmur heard. Pulmonary/Chest: Effort normal and breath sounds normal. No accessory muscle usage. Not tachypneic. No respiratory distress.  She has no decreased breath sounds. She has no wheezes. She has no rhonchi. She has no rales. She exhibits no tenderness.  Musculoskeletal: Normal range of motion. She exhibits no edema and no tenderness.  Lymphadenopathy:    She has no cervical adenopathy.  Neurological: She is alert and oriented to person, place, and time. No cranial nerve deficit. She exhibits normal muscle tone. Coordination normal.  Skin: Skin is warm and dry. No rash noted. She is not diaphoretic. No erythema. No pallor.  Psychiatric: She has a normal mood and affect. Her behavior is normal. Judgment and thought content normal.          Assessment & Plan:

## 2013-03-17 ENCOUNTER — Encounter: Payer: Self-pay | Admitting: Internal Medicine

## 2013-04-16 ENCOUNTER — Encounter: Payer: Self-pay | Admitting: Internal Medicine

## 2013-04-16 DIAGNOSIS — E1165 Type 2 diabetes mellitus with hyperglycemia: Secondary | ICD-10-CM

## 2013-04-16 DIAGNOSIS — IMO0002 Reserved for concepts with insufficient information to code with codable children: Secondary | ICD-10-CM

## 2013-04-17 MED ORDER — INSULIN DETEMIR 100 UNIT/ML FLEXPEN: 15.0000 [IU] | PEN_INJECTOR | Freq: Every day | SUBCUTANEOUS | Status: DC

## 2013-05-04 ENCOUNTER — Other Ambulatory Visit: Payer: Self-pay | Admitting: Internal Medicine

## 2013-05-13 ENCOUNTER — Ambulatory Visit: Payer: BC Managed Care – PPO | Admitting: Internal Medicine

## 2013-06-02 ENCOUNTER — Other Ambulatory Visit: Payer: Self-pay | Admitting: Internal Medicine

## 2013-06-11 ENCOUNTER — Encounter: Payer: Self-pay | Admitting: Internal Medicine

## 2013-06-12 ENCOUNTER — Other Ambulatory Visit: Payer: Self-pay | Admitting: Internal Medicine

## 2013-06-20 ENCOUNTER — Encounter: Payer: Self-pay | Admitting: Internal Medicine

## 2013-06-20 ENCOUNTER — Ambulatory Visit (INDEPENDENT_AMBULATORY_CARE_PROVIDER_SITE_OTHER): Payer: BC Managed Care – PPO | Admitting: Internal Medicine

## 2013-06-20 VITALS — BP 162/88 | HR 63 | Temp 98.0°F | Wt 187.0 lb

## 2013-06-20 DIAGNOSIS — I10 Essential (primary) hypertension: Secondary | ICD-10-CM

## 2013-06-20 DIAGNOSIS — E1165 Type 2 diabetes mellitus with hyperglycemia: Secondary | ICD-10-CM

## 2013-06-20 DIAGNOSIS — I701 Atherosclerosis of renal artery: Secondary | ICD-10-CM

## 2013-06-20 DIAGNOSIS — E039 Hypothyroidism, unspecified: Secondary | ICD-10-CM

## 2013-06-20 DIAGNOSIS — IMO0002 Reserved for concepts with insufficient information to code with codable children: Secondary | ICD-10-CM

## 2013-06-20 DIAGNOSIS — IMO0001 Reserved for inherently not codable concepts without codable children: Secondary | ICD-10-CM

## 2013-06-20 LAB — COMPREHENSIVE METABOLIC PANEL
ALT: 11 U/L (ref 0–35)
AST: 17 U/L (ref 0–37)
Albumin: 4.2 g/dL (ref 3.5–5.2)
Alkaline Phosphatase: 162 U/L — ABNORMAL HIGH (ref 39–117)
BUN: 14 mg/dL (ref 6–23)
CO2: 25 mEq/L (ref 19–32)
CREATININE: 0.8 mg/dL (ref 0.4–1.2)
Calcium: 9.5 mg/dL (ref 8.4–10.5)
Chloride: 98 mEq/L (ref 96–112)
GFR: 76.04 mL/min (ref >60.00)
Glucose, Bld: 210 mg/dL — ABNORMAL HIGH (ref 70–99)
Potassium: 4 mEq/L (ref 3.5–5.1)
Sodium: 132 mEq/L — ABNORMAL LOW (ref 135–145)
Total Bilirubin: 0.4 mg/dL (ref 0.3–1.2)
Total Protein: 7.8 g/dL (ref 6.0–8.3)

## 2013-06-20 LAB — HEMOGLOBIN A1C: Hgb A1c MFr Bld: 9.8 % — ABNORMAL HIGH (ref 4.6–6.5)

## 2013-06-20 MED ORDER — INSULIN DETEMIR 100 UNIT/ML FLEXPEN: 30.0000 [IU] | PEN_INJECTOR | Freq: Every day | SUBCUTANEOUS | Status: DC

## 2013-06-20 NOTE — Assessment & Plan Note (Signed)
BP Readings from Last 3 Encounters:  06/20/13 162/88  03/11/13 178/90  02/11/13 174/90   BP elevated, however much better controlled at home. Will continue current medications.

## 2013-06-20 NOTE — Assessment & Plan Note (Signed)
Pt reports recent renal artery doppler was stable. Will request notes from Vascular Surgery.

## 2013-06-20 NOTE — Assessment & Plan Note (Signed)
Will check TSH with labs. 

## 2013-06-20 NOTE — Progress Notes (Signed)
Subjective:    Patient ID: Danielle Park, female    DOB: [birth year], 63 y.o.   MRN: [id number]  HPI 63YO female presents for follow up.  DM - Was out of insulin x 1 month. Blood sugars mostly in 200s. Started back at 20units, but BG unchanged.  HTN - BP has been 130-150s/70-80s at home. Compliant with meds. No chest pain, headache.  RAS - had US with Dr. Wyn Quakerew. US showed slight narrowing, but plan to just monitor in 6 months.  Review of Systems  Constitutional: Negative for fever, chills, appetite change, fatigue and unexpected weight change.  HENT: Negative for congestion, ear pain, sinus pressure, sore throat, trouble swallowing and voice change.   Eyes: Negative for visual disturbance.  Respiratory: Negative for cough, shortness of breath, wheezing and stridor.   Cardiovascular: Negative for chest pain, palpitations and leg swelling.  Gastrointestinal: Negative for nausea, vomiting, abdominal pain, diarrhea, constipation, blood in stool, abdominal distention and anal bleeding.  Genitourinary: Negative for dysuria and flank pain.  Musculoskeletal: Negative for arthralgias, gait problem, myalgias and neck pain.  Skin: Negative for color change and rash.  Neurological: Negative for dizziness and headaches.  Hematological: Negative for adenopathy. Does not bruise/bleed easily.  Psychiatric/Behavioral: Negative for suicidal ideas, sleep disturbance and dysphoric mood. The patient is not nervous/anxious.        Objective:    BP 162/88  Pulse 63  Temp(Src) 98 F (36.7 C) (Oral)  Wt 187 lb (84.823 kg)  SpO2 98% Physical Exam  Constitutional: She is oriented to person, place, and time. She appears well-developed and well-nourished. No distress.  HENT:  Head: Normocephalic and atraumatic.  Right Ear: External ear normal.  Left Ear: External ear normal.  Nose: Nose normal.  Mouth/Throat: Oropharynx is clear and moist. No oropharyngeal exudate.  Eyes: Conjunctivae are normal.  Pupils are equal, round, and reactive to light. Right eye exhibits no discharge. Left eye exhibits no discharge. No scleral icterus.  Neck: Normal range of motion. Neck supple. No tracheal deviation present. No thyromegaly present.  Cardiovascular: Normal rate, regular rhythm, normal heart sounds and intact distal pulses.  Exam reveals no gallop and no friction rub.   No murmur heard. Pulmonary/Chest: Effort normal and breath sounds normal. No accessory muscle usage. Not tachypneic. No respiratory distress. She has no decreased breath sounds. She has no wheezes. She has no rhonchi. She has no rales. She exhibits no tenderness.  Musculoskeletal: Normal range of motion. She exhibits no edema and no tenderness.  Lymphadenopathy:    She has no cervical adenopathy.  Neurological: She is alert and oriented to person, place, and time. No cranial nerve deficit. She exhibits normal muscle tone. Coordination normal.  Skin: Skin is warm and dry. No rash noted. She is not diaphoretic. No erythema. No pallor.  Psychiatric: She has a normal mood and affect. Her behavior is normal. Judgment and thought content normal.          Assessment & Plan:   Problem List Items Addressed This Visit   Diabetes mellitus type 2, uncontrolled - Primary      Lab Results  Component Value Date   HGBA1C 9.8* 06/20/2013   BG elevated, however pt has been out of insulin intermittently. Will increase Levemir to 30units daily. Pt will email with BG readings.     Relevant Medications      Insulin Detemir (LEVEMIR) 100 UNIT/ML Pen   Other Relevant Orders      Comprehensive metabolic panel (Completed)  Hemoglobin A1c (Completed)      Microalbumin / creatinine urine ratio   Hypertension      BP Readings from Last 3 Encounters:  06/20/13 162/88  03/11/13 178/90  02/11/13 174/90   BP elevated, however much better controlled at home. Will continue current medications.    Hypothyroidism     Will check TSH with  labs.    Renal artery stenosis     Pt reports recent renal artery doppler was stable. Will request notes from Vascular Surgery.        Return in about 3 months (around 09/20/2013) for Recheck of Diabetes.

## 2013-06-20 NOTE — Assessment & Plan Note (Signed)
Lab Results  Component Value Date   HGBA1C 9.8* 06/20/2013   BG elevated, however pt has been out of insulin intermittently. Will increase Levemir to 30units daily. Pt will email with BG readings.

## 2013-06-20 NOTE — Progress Notes (Signed)
Pre visit review using our clinic review tool, if applicable. No additional management support is needed unless otherwise documented below in the visit note. 

## 2013-06-20 NOTE — Assessment & Plan Note (Signed)
>>  ASSESSMENT AND PLAN FOR CONTROLLED DIABETES MELLITUS TYPE 2 WITH COMPLICATIONS (HCC) WRITTEN ON 06/20/2013  8:08 PM BY VANNIE NEST A, MD  Lab Results  Component Value Date   HGBA1C 9.8* 06/20/2013   BG elevated, however pt has been out of insulin  intermittently. Will increase Levemir  to 30units daily. Pt will email with BG readings.

## 2013-06-20 NOTE — Assessment & Plan Note (Signed)
>>  ASSESSMENT AND PLAN FOR HYPERTENSION ASSOCIATED WITH DIABETES (HCC) WRITTEN ON 06/20/2013  8:09 PM BY VANNIE, JENNIFER A, MD  BP Readings from Last 3 Encounters:  06/20/13 162/88  03/11/13 178/90  02/11/13 174/90   BP elevated, however much better controlled at home. Will continue current medications.

## 2013-06-21 ENCOUNTER — Telehealth: Payer: Self-pay | Admitting: Internal Medicine

## 2013-06-21 NOTE — Telephone Encounter (Signed)
Relevant patient education assigned to patient using Emmi. ° °

## 2013-06-23 LAB — MICROALBUMIN / CREATININE URINE RATIO
Creatinine,U: 20.5 mg/dL
MICROALB/CREAT RATIO: 11.7 mg/g (ref 0.0–30.0)
Microalb, Ur: 2.4 mg/dL — ABNORMAL HIGH (ref 0.0–1.9)

## 2013-07-03 ENCOUNTER — Telehealth: Payer: Self-pay

## 2013-07-03 NOTE — Telephone Encounter (Signed)
Relevant patient education assigned to patient using Emmi. ° °

## 2013-07-18 ENCOUNTER — Telehealth: Payer: Self-pay | Admitting: Internal Medicine

## 2013-07-18 NOTE — Telephone Encounter (Signed)
Called [facility] to clarify what the problem is with Levemir, line busy.

## 2013-07-18 NOTE — Telephone Encounter (Signed)
Pt states she has been trying to get her levemir insulin since March but is being told by [facility] it needs prior authorization that has not been approved.  Pt states she has had samples to get her by but will be out by Tuesday of next week.  Pt is concerned that she will have to go without her insulin completely.  States she is willing to switch insulins or whatever she needs to do to ensure she has some.

## 2013-07-21 NOTE — Telephone Encounter (Signed)
Called [facility] to clarify problem. Pt is needing a PA, paperwork has already been faxed to our office. I found the completed PA which had already been faxed to insurance 07/17/13. Refaxed PA. Left message for patient, notifying her a PA has been completed and re faxed to her insurance. Advised her to follow up with her insurance to verify they received it.

## 2013-07-24 ENCOUNTER — Encounter: Payer: Self-pay | Admitting: *Deleted

## 2013-07-28 ENCOUNTER — Encounter: Payer: Self-pay | Admitting: Internal Medicine

## 2013-07-30 ENCOUNTER — Telehealth: Payer: Self-pay | Admitting: *Deleted

## 2013-07-30 NOTE — Telephone Encounter (Signed)
[employer] called about PA form, stating they have not received it.  Asked representative to verify the fax number, as we have faxed several times, the fax number did not match what was listed on the PA request.  Re-faxed for the 3rd time, to the correct number this time.

## 2013-07-31 ENCOUNTER — Telehealth: Payer: Self-pay | Admitting: *Deleted

## 2013-07-31 NOTE — Telephone Encounter (Signed)
PA request form placed Dr. Dan HumphreysWalker box [facility]([employer])

## 2013-09-25 ENCOUNTER — Ambulatory Visit: Payer: BC Managed Care – PPO | Admitting: Internal Medicine

## 2013-11-17 ENCOUNTER — Ambulatory Visit (INDEPENDENT_AMBULATORY_CARE_PROVIDER_SITE_OTHER): Payer: BC Managed Care – PPO | Admitting: Internal Medicine

## 2013-11-17 ENCOUNTER — Encounter: Payer: Self-pay | Admitting: Internal Medicine

## 2013-11-17 VITALS — BP 140/90 | HR 64 | Temp 98.2°F | Ht 64.0 in | Wt 183.5 lb

## 2013-11-17 DIAGNOSIS — E039 Hypothyroidism, unspecified: Secondary | ICD-10-CM

## 2013-11-17 DIAGNOSIS — R7989 Other specified abnormal findings of blood chemistry: Secondary | ICD-10-CM

## 2013-11-17 DIAGNOSIS — E1165 Type 2 diabetes mellitus with hyperglycemia: Secondary | ICD-10-CM

## 2013-11-17 DIAGNOSIS — IMO0001 Reserved for inherently not codable concepts without codable children: Secondary | ICD-10-CM

## 2013-11-17 DIAGNOSIS — I150 Renovascular hypertension: Secondary | ICD-10-CM

## 2013-11-17 DIAGNOSIS — Z1211 Encounter for screening for malignant neoplasm of colon: Secondary | ICD-10-CM

## 2013-11-17 DIAGNOSIS — IMO0002 Reserved for concepts with insufficient information to code with codable children: Secondary | ICD-10-CM

## 2013-11-17 LAB — LIPID PANEL
CHOL/HDL RATIO: 5
Cholesterol: 200 mg/dL (ref 0–200)
HDL: 42.2 mg/dL (ref >39.00)
NONHDL: 157.8
TRIGLYCERIDES: 274 mg/dL — AB (ref 0.0–149.0)
VLDL: 54.8 mg/dL — AB (ref 0.0–40.0)

## 2013-11-17 LAB — MICROALBUMIN / CREATININE URINE RATIO
Creatinine,U: 19.5 mg/dL
Microalb Creat Ratio: 2.6 mg/g (ref 0.0–30.0)
Microalb, Ur: 0.5 mg/dL (ref 0.0–1.9)

## 2013-11-17 LAB — COMPREHENSIVE METABOLIC PANEL
ALT: 11 U/L (ref 0–35)
AST: 19 U/L (ref 0–37)
Albumin: 3.8 g/dL (ref 3.5–5.2)
Alkaline Phosphatase: 128 U/L — ABNORMAL HIGH (ref 39–117)
BILIRUBIN TOTAL: 0.5 mg/dL (ref 0.2–1.2)
BUN: 13 mg/dL (ref 6–23)
CALCIUM: 9.2 mg/dL (ref 8.4–10.5)
CHLORIDE: 101 meq/L (ref 96–112)
CO2: 27 mEq/L (ref 19–32)
Creatinine, Ser: 0.9 mg/dL (ref 0.4–1.2)
GFR: 67.24 mL/min (ref >60.00)
Glucose, Bld: 233 mg/dL — ABNORMAL HIGH (ref 70–99)
Potassium: 4.5 mEq/L (ref 3.5–5.1)
Sodium: 136 mEq/L (ref 135–145)
Total Protein: 7.8 g/dL (ref 6.0–8.3)

## 2013-11-17 LAB — TSH: TSH: 4.55 u[IU]/mL — AB (ref 0.35–4.50)

## 2013-11-17 LAB — LDL CHOLESTEROL, DIRECT: LDL DIRECT: 129.1 mg/dL

## 2013-11-17 LAB — HEMOGLOBIN A1C: Hgb A1c MFr Bld: 9.4 % — ABNORMAL HIGH (ref 4.6–6.5)

## 2013-11-17 LAB — HM COLONOSCOPY

## 2013-11-17 LAB — HM DIABETES FOOT EXAM: HM Diabetic Foot Exam: NORMAL

## 2013-11-17 MED ORDER — INSULIN DETEMIR 100 UNIT/ML FLEXPEN: 30.0000 [IU] | PEN_INJECTOR | Freq: Every day | SUBCUTANEOUS | Status: DC

## 2013-11-17 NOTE — Progress Notes (Signed)
Subjective:    Patient ID: Danielle Park, female    DOB: [birth year], 63 y.o.   MRN: [id number]  HPI 63YO female presents for follow up.  DM - closer to 150 on Levemir, but up near 200-250 off medication. Insurance has not yet approved Levemir. Trying to follow healthier diet.  HTN - BP running 130-140s/60-80s mostly. Compliant with meds.   Review of Systems  Constitutional: Negative for fever, chills, appetite change, fatigue and unexpected weight change.  Eyes: Negative for visual disturbance.  Respiratory: Negative for cough and shortness of breath.   Cardiovascular: Negative for chest pain and leg swelling.  Gastrointestinal: Negative for nausea, vomiting, abdominal pain, diarrhea and constipation.  Musculoskeletal: Positive for arthralgias and myalgias (chronic bilateral hip pain).  Skin: Negative for color change and rash.  Hematological: Negative for adenopathy. Does not bruise/bleed easily.  Psychiatric/Behavioral: Negative for dysphoric mood. The patient is not nervous/anxious.        Objective:    BP 140/90  Pulse 64  Temp(Src) 98.2 F (36.8 C) (Oral)  Ht  (1.626 m)  Wt 183 lb 8 oz (83.235 kg)  BMI 31.48 kg/m2  SpO2 96% Physical Exam  Constitutional: She is oriented to person, place, and time. She appears well-developed and well-nourished. No distress.  HENT:  Head: Normocephalic and atraumatic.  Right Ear: External ear normal.  Left Ear: External ear normal.  Nose: Nose normal.  Mouth/Throat: Oropharynx is clear and moist. No oropharyngeal exudate.  Eyes: Conjunctivae are normal. Pupils are equal, round, and reactive to light. Right eye exhibits no discharge. Left eye exhibits no discharge. No scleral icterus.  Neck: Normal range of motion. Neck supple. No tracheal deviation present. No thyromegaly present.  Cardiovascular: Normal rate, regular rhythm, normal heart sounds and intact distal pulses.  Exam reveals no gallop and no friction rub.   No murmur  heard. Pulmonary/Chest: Effort normal and breath sounds normal. No accessory muscle usage. Not tachypneic. No respiratory distress. She has no decreased breath sounds. She has no wheezes. She has no rhonchi. She has no rales. She exhibits no tenderness.  Musculoskeletal: Normal range of motion. She exhibits no edema and no tenderness.  Lymphadenopathy:    She has no cervical adenopathy.  Neurological: She is alert and oriented to person, place, and time. No cranial nerve deficit. She exhibits normal muscle tone. Coordination normal.  Skin: Skin is warm and dry. No rash noted. She is not diaphoretic. No erythema. No pallor.  Psychiatric: She has a normal mood and affect. Her behavior is normal. Judgment and thought content normal.          Assessment & Plan:   Problem List Items Addressed This Visit     High   Diabetes mellitus type 2, uncontrolled - Primary      Lab Results  Component Value Date   HGBA1C 9.8* 06/20/2013   Will check A1c with labs. Continue Levemir. Will resend PA.    Relevant Medications      Insulin Detemir (LEVEMIR) 100 UNIT/ML Pen   Other Relevant Orders      TSH      Comprehensive metabolic panel      Hemoglobin A1c      Lipid panel      Microalbumin / creatinine urine ratio   Hypertension      BP Readings from Last 3 Encounters:  11/17/13 140/90  06/20/13 162/88  03/11/13 178/90   BP improved compared to previous. Will continue current medications and check renal  function with labs today.      Unprioritized   Hypothyroidism     Check TSH with labs today. Continue Levothyroxine.    Screening for colon cancer     Will set up screening colonoscopy.    Relevant Orders      Ambulatory referral to Colorectal Surgery       Return in about 3 months (around 02/17/2014) for Recheck of Diabetes.

## 2013-11-17 NOTE — Assessment & Plan Note (Signed)
Lab Results  Component Value Date   HGBA1C 9.8* 06/20/2013   Will check A1c with labs. Continue Levemir. Will resend PA.

## 2013-11-17 NOTE — Assessment & Plan Note (Signed)
Will set up screening colonoscopy. 

## 2013-11-17 NOTE — Assessment & Plan Note (Signed)
>>  ASSESSMENT AND PLAN FOR CONTROLLED DIABETES MELLITUS TYPE 2 WITH COMPLICATIONS (HCC) WRITTEN ON 11/17/2013  8:46 AM BY VANNIE NEST A, MD  Lab Results  Component Value Date   HGBA1C 9.8* 06/20/2013   Will check A1c with labs. Continue Levemir . Will resend PA.

## 2013-11-17 NOTE — Progress Notes (Signed)
Pre visit review using our clinic review tool, if applicable. No additional management support is needed unless otherwise documented below in the visit note. 

## 2013-11-17 NOTE — Assessment & Plan Note (Signed)
BP Readings from Last 3 Encounters:  11/17/13 140/90  06/20/13 162/88  03/11/13 178/90   BP improved compared to previous. Will continue current medications and check renal function with labs today.

## 2013-11-17 NOTE — Assessment & Plan Note (Signed)
Check TSH with labs today. Continue Levothyroxine. 

## 2013-11-17 NOTE — Patient Instructions (Signed)
Please set up eye exam.  Labs today.

## 2013-11-17 NOTE — Assessment & Plan Note (Signed)
>>  ASSESSMENT AND PLAN FOR HYPERTENSION ASSOCIATED WITH DIABETES (HCC) WRITTEN ON 11/17/2013  8:45 AM BY VANNIE, JENNIFER A, MD  BP Readings from Last 3 Encounters:  11/17/13 140/90  06/20/13 162/88  03/11/13 178/90   BP improved compared to previous. Will continue current medications and check renal function with labs today.

## 2013-11-18 ENCOUNTER — Ambulatory Visit: Payer: BC Managed Care – PPO

## 2013-11-18 DIAGNOSIS — E039 Hypothyroidism, unspecified: Secondary | ICD-10-CM

## 2013-11-18 LAB — T4, FREE: Free T4: 1.33 ng/dL (ref 0.60–1.60)

## 2013-11-20 ENCOUNTER — Encounter: Payer: Self-pay | Admitting: *Deleted

## 2013-11-24 MED ORDER — INSULIN GLARGINE 100 UNIT/ML SOLOSTAR PEN: 10.0000 [IU] | PEN_INJECTOR | Freq: Every day | SUBCUTANEOUS | Status: DC

## 2013-12-02 ENCOUNTER — Other Ambulatory Visit: Payer: Self-pay | Admitting: Internal Medicine

## 2013-12-07 ENCOUNTER — Encounter: Payer: Self-pay | Admitting: Internal Medicine

## 2013-12-09 ENCOUNTER — Other Ambulatory Visit: Payer: Self-pay | Admitting: Internal Medicine

## 2013-12-29 ENCOUNTER — Encounter: Payer: Self-pay | Admitting: Internal Medicine

## 2014-01-09 ENCOUNTER — Other Ambulatory Visit: Payer: Self-pay | Admitting: Internal Medicine

## 2014-02-06 ENCOUNTER — Ambulatory Visit: Payer: Self-pay | Admitting: Gastroenterology

## 2014-02-06 LAB — HM COLONOSCOPY

## 2014-02-08 ENCOUNTER — Other Ambulatory Visit: Payer: Self-pay | Admitting: Internal Medicine

## 2014-02-09 ENCOUNTER — Other Ambulatory Visit: Payer: Self-pay | Admitting: *Deleted

## 2014-02-09 MED ORDER — GLIPIZIDE ER 10 MG PO TB24: ORAL_TABLET | ORAL | Status: DC

## 2014-02-18 ENCOUNTER — Encounter: Payer: Self-pay | Admitting: Internal Medicine

## 2014-02-18 ENCOUNTER — Ambulatory Visit (INDEPENDENT_AMBULATORY_CARE_PROVIDER_SITE_OTHER): Payer: BC Managed Care – PPO | Admitting: Internal Medicine

## 2014-02-18 VITALS — BP 169/80 | HR 63 | Temp 97.8°F | Ht 64.0 in | Wt 187.8 lb

## 2014-02-18 DIAGNOSIS — E1165 Type 2 diabetes mellitus with hyperglycemia: Secondary | ICD-10-CM

## 2014-02-18 DIAGNOSIS — IMO0002 Reserved for concepts with insufficient information to code with codable children: Secondary | ICD-10-CM

## 2014-02-18 DIAGNOSIS — I701 Atherosclerosis of renal artery: Secondary | ICD-10-CM

## 2014-02-18 DIAGNOSIS — I10 Essential (primary) hypertension: Secondary | ICD-10-CM

## 2014-02-18 LAB — HEMOGLOBIN A1C: Hgb A1c MFr Bld: 9.2 % — ABNORMAL HIGH (ref 4.6–6.5)

## 2014-02-18 LAB — LIPID PANEL
CHOL/HDL RATIO: 5
Cholesterol: 205 mg/dL — ABNORMAL HIGH (ref 0–200)
HDL: 39.5 mg/dL (ref >39.00)
NONHDL: 165.5
Triglycerides: 202 mg/dL — ABNORMAL HIGH (ref 0.0–149.0)
VLDL: 40.4 mg/dL — AB (ref 0.0–40.0)

## 2014-02-18 LAB — COMPREHENSIVE METABOLIC PANEL
ALBUMIN: 4 g/dL (ref 3.5–5.2)
ALK PHOS: 145 U/L — AB (ref 39–117)
ALT: 9 U/L (ref 0–35)
AST: 16 U/L (ref 0–37)
BUN: 18 mg/dL (ref 6–23)
CALCIUM: 9.1 mg/dL (ref 8.4–10.5)
CHLORIDE: 101 meq/L (ref 96–112)
CO2: 26 mEq/L (ref 19–32)
Creatinine, Ser: 0.8 mg/dL (ref 0.4–1.2)
GFR: 76.97 mL/min (ref >60.00)
GLUCOSE: 240 mg/dL — AB (ref 70–99)
POTASSIUM: 4.8 meq/L (ref 3.5–5.1)
Sodium: 135 mEq/L (ref 135–145)
TOTAL PROTEIN: 7.8 g/dL (ref 6.0–8.3)
Total Bilirubin: 0.5 mg/dL (ref 0.2–1.2)

## 2014-02-18 LAB — MICROALBUMIN / CREATININE URINE RATIO
Creatinine,U: 11.6 mg/dL
MICROALB UR: 0.8 mg/dL (ref 0.0–1.9)
Microalb Creat Ratio: 6.9 mg/g (ref 0.0–30.0)

## 2014-02-18 LAB — LDL CHOLESTEROL, DIRECT: Direct LDL: 130.1 mg/dL

## 2014-02-18 NOTE — Progress Notes (Signed)
Subjective:    Patient ID: Danielle Park, female    DOB: 08/07/1950, 63 y.o.   MRN: [id number]  HPI 63YO female presents for follow up.  DM - BG mostly near 130s-150s fasting. A few recent BG near 200. Compliant with Lantus. No low BG <70.   HTN - BP typically near 130/60s at home. Compliant with medication. No chest pain, headache.  No new concerns today, except notes she recently had a colonoscopy which was normal and she is concerned about some recent changes in insurance coverage.  Review of Systems  Constitutional: Negative for fever, chills, appetite change, fatigue and unexpected weight change.  Eyes: Negative for visual disturbance.  Respiratory: Negative for shortness of breath.   Cardiovascular: Negative for chest pain, palpitations and leg swelling.  Gastrointestinal: Negative for nausea, vomiting, abdominal pain, diarrhea and constipation.  Skin: Negative for color change and rash.  Hematological: Negative for adenopathy. Does not bruise/bleed easily.  Psychiatric/Behavioral: Negative for dysphoric mood. The patient is not nervous/anxious.        Objective:    BP 169/80 mmHg  Pulse 63  Temp(Src) 97.8 F (36.6 C) (Oral)  Ht 5\' 4"  (1.626 m)  Wt 187 lb 12 oz (85.163 kg)  BMI 32.21 kg/m2  SpO2 99% Physical Exam  Constitutional: She is oriented to person, place, and time. She appears well-developed and well-nourished. No distress.  HENT:  Head: Normocephalic and atraumatic.  Right Ear: External ear normal.  Left Ear: External ear normal.  Nose: Nose normal.  Mouth/Throat: Oropharynx is clear and moist. No oropharyngeal exudate.  Eyes: Conjunctivae are normal. Pupils are equal, round, and reactive to light. Right eye exhibits no discharge. Left eye exhibits no discharge. No scleral icterus.  Neck: Normal range of motion. Neck supple. No tracheal deviation present. No thyromegaly present.  Cardiovascular: Normal rate, regular rhythm, normal heart sounds and intact  distal pulses.  Exam reveals no gallop and no friction rub.   No murmur heard. Pulmonary/Chest: Effort normal and breath sounds normal. No accessory muscle usage. No tachypnea. No respiratory distress. She has no decreased breath sounds. She has no wheezes. She has no rhonchi. She has no rales. She exhibits no tenderness.  Musculoskeletal: Normal range of motion. She exhibits no edema or tenderness.  Lymphadenopathy:    She has no cervical adenopathy.  Neurological: She is alert and oriented to person, place, and time. No cranial nerve deficit. She exhibits normal muscle tone. Coordination normal.  Skin: Skin is warm and dry. No rash noted. She is not diaphoretic. No erythema. No pallor.  Psychiatric: She has a normal mood and affect. Her behavior is normal. Judgment and thought content normal.          Assessment & Plan:   Problem List Items Addressed This Visit      High   Diabetes mellitus type 2, uncontrolled - Primary    BG better controlled per her report.  Will recheck A1c with labs today.    Relevant Orders      Comprehensive metabolic panel      Hemoglobin A1c      Lipid panel      Microalbumin / creatinine urine ratio   Hypertension    BP Readings from Last 3 Encounters:  02/18/14 169/80  11/17/13 140/90  06/20/13 162/88   BP elevated today, but generally better controlled at home. Will check renal function with labs today. Follow up with vascular as scheduled.      Unprioritized   Renal  artery stenosis    Discussed follow up with Vascular. Her insurance has dropped her current vascular surgeon, and we discussed referral for new vascular surgeon for follow up US. She would like to hold off for now.        Return in about 3 months (around 05/21/2014) for Recheck of Diabetes.

## 2014-02-18 NOTE — Progress Notes (Signed)
Pre visit review using our clinic review tool, if applicable. No additional management support is needed unless otherwise documented below in the visit note. 

## 2014-02-18 NOTE — Assessment & Plan Note (Signed)
BP Readings from Last 3 Encounters:  02/18/14 169/80  11/17/13 140/90  06/20/13 162/88   BP elevated today, but generally better controlled at home. Will check renal function with labs today. Follow up with vascular as scheduled.

## 2014-02-18 NOTE — Assessment & Plan Note (Signed)
>>  ASSESSMENT AND PLAN FOR CONTROLLED DIABETES MELLITUS TYPE 2 WITH COMPLICATIONS (HCC) WRITTEN ON 02/18/2014  8:09 AM BY WALKER, JENNIFER A, MD  BG better controlled per her report.  Will recheck A1c with labs today.

## 2014-02-18 NOTE — Assessment & Plan Note (Signed)
>>  ASSESSMENT AND PLAN FOR HYPERTENSION ASSOCIATED WITH DIABETES (HCC) WRITTEN ON 02/18/2014  8:08 AM BY VANNIE, JENNIFER A, MD  BP Readings from Last 3 Encounters:  02/18/14 169/80  11/17/13 140/90  06/20/13 162/88   BP elevated today, but generally better controlled at home. Will check renal function with labs today. Follow up with vascular as scheduled.

## 2014-02-18 NOTE — Patient Instructions (Signed)
Labs today.  Follow up 3 months. 

## 2014-02-18 NOTE — Assessment & Plan Note (Signed)
Discussed follow up with Vascular. Her insurance has dropped her current vascular surgeon, and we discussed referral for new vascular surgeon for follow up US. She would like to hold off for now.

## 2014-02-18 NOTE — Assessment & Plan Note (Signed)
BG better controlled per her report.  Will recheck A1c with labs today.

## 2014-03-05 ENCOUNTER — Other Ambulatory Visit: Payer: Self-pay | Admitting: Internal Medicine

## 2014-04-20 ENCOUNTER — Other Ambulatory Visit: Payer: Self-pay | Admitting: Internal Medicine

## 2014-04-24 ENCOUNTER — Encounter: Payer: Self-pay | Admitting: Internal Medicine

## 2014-05-21 ENCOUNTER — Encounter: Payer: Self-pay | Admitting: Internal Medicine

## 2014-05-21 ENCOUNTER — Ambulatory Visit (INDEPENDENT_AMBULATORY_CARE_PROVIDER_SITE_OTHER): Payer: BC Managed Care – PPO | Admitting: Internal Medicine

## 2014-05-21 VITALS — BP 187/80 | HR 68 | Temp 97.7°F | Ht 64.0 in | Wt 191.5 lb

## 2014-05-21 DIAGNOSIS — M25559 Pain in unspecified hip: Secondary | ICD-10-CM | POA: Insufficient documentation

## 2014-05-21 DIAGNOSIS — IMO0002 Reserved for concepts with insufficient information to code with codable children: Secondary | ICD-10-CM

## 2014-05-21 DIAGNOSIS — E669 Obesity, unspecified: Secondary | ICD-10-CM

## 2014-05-21 DIAGNOSIS — E1165 Type 2 diabetes mellitus with hyperglycemia: Secondary | ICD-10-CM

## 2014-05-21 DIAGNOSIS — I10 Essential (primary) hypertension: Secondary | ICD-10-CM

## 2014-05-21 LAB — LIPID PANEL
CHOL/HDL RATIO: 4
Cholesterol: 167 mg/dL (ref 0–200)
HDL: 42.7 mg/dL (ref >39.00)
LDL Cholesterol: 90 mg/dL (ref 0–99)
NonHDL: 124.3
TRIGLYCERIDES: 172 mg/dL — AB (ref 0.0–149.0)
VLDL: 34.4 mg/dL (ref 0.0–40.0)

## 2014-05-21 LAB — COMPREHENSIVE METABOLIC PANEL
ALK PHOS: 144 U/L — AB (ref 39–117)
ALT: 8 U/L (ref 0–35)
AST: 14 U/L (ref 0–37)
Albumin: 3.9 g/dL (ref 3.5–5.2)
BUN: 14 mg/dL (ref 6–23)
CHLORIDE: 105 meq/L (ref 96–112)
CO2: 26 mEq/L (ref 19–32)
Calcium: 9 mg/dL (ref 8.4–10.5)
Creatinine, Ser: 0.82 mg/dL (ref 0.40–1.20)
GFR: 74.75 mL/min (ref >60.00)
Glucose, Bld: 245 mg/dL — ABNORMAL HIGH (ref 70–99)
Potassium: 4.4 mEq/L (ref 3.5–5.1)
SODIUM: 135 meq/L (ref 135–145)
TOTAL PROTEIN: 7.3 g/dL (ref 6.0–8.3)
Total Bilirubin: 0.4 mg/dL (ref 0.2–1.2)

## 2014-05-21 LAB — MICROALBUMIN / CREATININE URINE RATIO
Creatinine,U: 18.8 mg/dL
MICROALB UR: 1.2 mg/dL (ref 0.0–1.9)
Microalb Creat Ratio: 6.4 mg/g (ref 0.0–30.0)

## 2014-05-21 LAB — HEMOGLOBIN A1C: Hgb A1c MFr Bld: 8.9 % — ABNORMAL HIGH (ref 4.6–6.5)

## 2014-05-21 NOTE — Patient Instructions (Signed)
Cardiac Diet This diet can help prevent heart disease and stroke. Many factors influence your heart health, including eating and exercise habits. Coronary risk rises a lot with abnormal blood fat (lipid) levels. Cardiac meal planning includes limiting unhealthy fats, increasing healthy fats, and making other small dietary changes. General guidelines are as follows:  Adjust calorie intake to reach and maintain desirable body weight.  Limit total fat intake to less than 30% of total calories. Saturated fat should be less than 7% of calories.  Saturated fats are found in animal products and in some vegetable products. Saturated vegetable fats are found in coconut oil, cocoa butter, palm oil, and palm kernel oil. Read labels carefully to avoid these products as much as possible. Use butter in moderation. Choose tub margarines and oils that have 2 grams of fat or less. Good cooking oils are canola and olive oils.  Practice low-fat cooking techniques. Do not fry food. Instead, broil, bake, boil, steam, grill, roast on a rack, stir-fry, or microwave it. Other fat reducing suggestions include:  Remove the skin from poultry.  Remove all visible fat from meats.  Skim the fat off stews, soups, and gravies before serving them.  Steam vegetables in water or broth instead of sauting them in fat.  Avoid foods with trans fat (or hydrogenated oils), such as commercially fried foods and commercially baked goods. Commercial shortening and deep-frying fats will contain trans fat.  Increase intake of fruits, vegetables, whole grains, and legumes to replace foods high in fat.  Increase consumption of nuts, legumes, and seeds to at least 4 servings weekly. One serving of a legume equals  cup, and 1 serving of nuts or seeds equals  cup.  Choose whole grains more often. Have 3 servings per day (a serving is 1 ounce [oz]).  Eat 4 to 5 servings of vegetables per day. A serving of vegetables is 1 cup of raw leafy  vegetables;  cup of raw or cooked cut-up vegetables;  cup of vegetable juice.  Eat 4 to 5 servings of fruit per day. A serving of fruit is 1 medium whole fruit;  cup of dried fruit;  cup of fresh, frozen, or canned fruit;  cup of 100% fruit juice.  Increase your intake of dietary fiber to 20 to 30 grams per day. Insoluble fiber may help lower your risk of heart disease and may help curb your appetite.  Soluble fiber binds cholesterol to be removed from the blood. Foods high in soluble fiber are dried beans, citrus fruits, oats, apples, bananas, broccoli, Brussels sprouts, and eggplant.  Try to include foods fortified with plant sterols or stanols, such as yogurt, breads, juices, or margarines. Choose several fortified foods to achieve a daily intake of 2 to 3 grams of plant sterols or stanols.  Foods with omega-3 fats can help reduce your risk of heart disease. Aim to have a 3.5 oz portion of fatty fish twice per week, such as salmon, mackerel, albacore tuna, sardines, lake trout, or herring. If you wish to take a fish oil supplement, choose one that contains 1 gram of both DHA and EPA.  Limit processed meats to 2 servings (3 oz portion) weekly.  Limit the sodium in your diet to 1500 milligrams (mg) per day. If you have high blood pressure, talk to a registered dietitian about a DASH (Dietary Approaches to Stop Hypertension) eating plan.  Limit sweets and beverages with added sugar, such as soda, to no more than 5 servings per week. One   serving is:   1 tablespoon sugar.  1 tablespoon jelly or jam.   cup sorbet.  1 cup lemonade.   cup regular soda. CHOOSING FOODS Starches  Allowed: Breads: All kinds (wheat, rye, raisin, white, oatmeal, Italian, French, and English muffin bread). Low-fat rolls: English muffins, frankfurter and hamburger buns, bagels, pita bread, tortillas (not fried). Pancakes, waffles, biscuits, and muffins made with recommended oil.  Avoid: Products made with  saturated or trans fats, oils, or whole milk products. Butter rolls, cheese breads, croissants. Commercial doughnuts, muffins, sweet rolls, biscuits, waffles, pancakes, store-bought mixes. Crackers  Allowed: Low-fat crackers and snacks: Animal, graham, rye, saltine (with recommended oil, no lard), oyster, and matzo crackers. Bread sticks, melba toast, rusks, flatbread, pretzels, and light popcorn.  Avoid: High-fat crackers: cheese crackers, butter crackers, and those made with coconut, palm oil, or trans fat (hydrogenated oils). Buttered popcorn. Cereals  Allowed: Hot or cold whole-grain cereals.  Avoid: Cereals containing coconut, hydrogenated vegetable fat, or animal fat. Potatoes / Pasta / Rice  Allowed: All kinds of potatoes, rice, and pasta (such as macaroni, spaghetti, and noodles).  Avoid: Pasta or rice prepared with cream sauce or high-fat cheese. Chow mein noodles, French fries. Vegetables  Allowed: All vegetables and vegetable juices.  Avoid: Fried vegetables. Vegetables in cream, butter, or high-fat cheese sauces. Limit coconut. Fruit in cream or custard. Protein  Allowed: Limit your intake of meat, seafood, and poultry to no more than 6 oz (cooked weight) per day. All lean, well-trimmed beef, veal, pork, and lamb. All chicken and turkey without skin. All fish and shellfish. Wild game: wild duck, rabbit, pheasant, and venison. Egg whites or low-cholesterol egg substitutes may be used as desired. Meatless dishes: recipes with dried beans, peas, lentils, and tofu (soybean curd). Seeds and nuts: all seeds and most nuts.  Avoid: Prime grade and other heavily marbled and fatty meats, such as short ribs, spare ribs, rib eye roast or steak, frankfurters, sausage, bacon, and high-fat luncheon meats, mutton. Caviar. Commercially fried fish. Domestic duck, goose, venison sausage. Organ meats: liver, gizzard, heart, chitterlings, brains, kidney, sweetbreads. Dairy  Allowed: Low-fat  cheeses: nonfat or low-fat cottage cheese (1% or 2% fat), cheeses made with part skim milk, such as mozzarella, farmers, string, or ricotta. (Cheeses should be labeled no more than 2 to 6 grams fat per oz.). Skim (or 1%) milk: liquid, powdered, or evaporated. Buttermilk made with low-fat milk. Drinks made with skim or low-fat milk or cocoa. Chocolate milk or cocoa made with skim or low-fat (1%) milk. Nonfat or low-fat yogurt.  Avoid: Whole milk cheeses, including colby, cheddar, muenster, Monterey Jack, Havarti, Brie, Camembert, American, Swiss, and blue. Creamed cottage cheese, cream cheese. Whole milk and whole milk products, including buttermilk or yogurt made from whole milk, drinks made from whole milk. Condensed milk, evaporated whole milk, and 2% milk. Soups and Combination Foods  Allowed: Low-fat low-sodium soups: broth, dehydrated soups, homemade broth, soups with the fat removed, homemade cream soups made with skim or low-fat milk. Low-fat spaghetti, lasagna, chili, and Spanish rice if low-fat ingredients and low-fat cooking techniques are used.  Avoid: Cream soups made with whole milk, cream, or high-fat cheese. All other soups. Desserts and Sweets  Allowed: Sherbet, fruit ices, gelatins, meringues, and angel food cake. Homemade desserts with recommended fats, oils, and milk products. Jam, jelly, honey, marmalade, sugars, and syrups. Pure sugar candy, such as gum drops, hard candy, jelly beans, marshmallows, mints, and small amounts of dark chocolate.  Avoid: Commercially prepared   cakes, pies, cookies, frosting, pudding, or mixes for these products. Desserts containing whole milk products, chocolate, coconut, lard, palm oil, or palm kernel oil. Ice cream or ice cream drinks. Candy that contains chocolate, coconut, butter, hydrogenated fat, or unknown ingredients. Buttered syrups. Fats and Oils  Allowed: Vegetable oils: safflower, sunflower, corn, soybean, cottonseed, sesame, canola, olive,  or peanut. Non-hydrogenated margarines. Salad dressing or mayonnaise: homemade or commercial, made with a recommended oil. Low or nonfat salad dressing or mayonnaise.  Limit added fats and oils to 6 to 8 tsp per day (includes fats used in cooking, baking, salads, and spreads on bread). Remember to count the "hidden fats" in foods.  Avoid: Solid fats and shortenings: butter, lard, salt pork, bacon drippings. Gravy containing meat fat, shortening, or suet. Cocoa butter, coconut. Coconut oil, palm oil, palm kernel oil, or hydrogenated oils: these ingredients are often used in bakery products, nondairy creamers, whipped toppings, candy, and commercially fried foods. Read labels carefully. Salad dressings made of unknown oils, sour cream, or cheese, such as blue cheese and Roquefort. Cream, all kinds: half-and-half, light, heavy, or whipping. Sour cream or cream cheese (even if "light" or low-fat). Nondairy cream substitutes: coffee creamers and sour cream substitutes made with palm, palm kernel, hydrogenated oils, or coconut oil. Beverages  Allowed: Coffee (regular or decaffeinated), tea. Diet carbonated beverages, mineral water. Alcohol: Check with your caregiver. Moderation is recommended.  Avoid: Whole milk, regular sodas, and juice drinks with added sugar. Condiments  Allowed: All seasonings and condiments. Cocoa powder. "Cream" sauces made with recommended ingredients.  Avoid: Carob powder made with hydrogenated fats. SAMPLE MENU Breakfast   cup orange juice   cup oatmeal  1 slice toast  1 tsp margarine  1 cup skim milk Lunch  Turkey sandwich with 2 oz turkey, 2 slices bread  Lettuce and tomato slices  Fresh fruit  Carrot sticks  Coffee or tea Snack  Fresh fruit or low-fat crackers Dinner  3 oz lean ground beef  1 baked potato  1 tsp margarine   cup asparagus  Lettuce salad  1 tbs non-creamy dressing   cup peach slices  1 cup skim milk Document Released:  12/21/2007 Document Revised: 09/12/2011 Document Reviewed: 05/13/2013 [employer] Patient Information 2015 [employer]. This information is not intended to replace advice given to you by your health care provider. Make sure you discuss any questions you have with your health care provider.  

## 2014-05-21 NOTE — Progress Notes (Signed)
Subjective:    Patient ID: Danielle Park, female    DOB: [birth year], 64 y.o.   MRN: [id number]  HPI  64YO female presents for follow up.  DM - Compliant with Lantus 20units daily. BG 150-170s. Few near 200. No low BG.  HTN - BP at home typically 130/60-70s. Compliant with medication. No CP, HA.  Bilateral thigh pain - Continues to have bilateral thigh pain. This has been ongoing for over 2 years. Previous hip xrays were normal. Pt denies back pain. No leg weakness or numbness. Pain described as aching. Not taking anything for pain. Prefers not to have additional intervention at this time.  Past medical, surgical, family and social history per today's encounter.  Review of Systems  Constitutional: Negative for fever, chills, appetite change, fatigue and unexpected weight change.  Eyes: Negative for visual disturbance.  Respiratory: Negative for shortness of breath.   Cardiovascular: Negative for chest pain and leg swelling.  Gastrointestinal: Negative for nausea, vomiting, abdominal pain, diarrhea and constipation.  Musculoskeletal: Positive for myalgias. Negative for back pain.  Skin: Negative for color change and rash.  Hematological: Negative for adenopathy. Does not bruise/bleed easily.  Psychiatric/Behavioral: Negative for dysphoric mood. The patient is not nervous/anxious.        Objective:    BP 187/80 mmHg  Pulse 68  Temp(Src) 97.7 F (36.5 C) (Oral)  Ht 5\' 4"  (1.626 m)  Wt 191 lb 8 oz (86.864 kg)  BMI 32.85 kg/m2  SpO2 97% Physical Exam  Constitutional: She is oriented to person, place, and time. She appears well-developed and well-nourished. No distress.  HENT:  Head: Normocephalic and atraumatic.  Right Ear: External ear normal.  Left Ear: External ear normal.  Nose: Nose normal.  Mouth/Throat: Oropharynx is clear and moist. No oropharyngeal exudate.  Eyes: Conjunctivae are normal. Pupils are equal, round, and reactive to light. Right eye exhibits no  discharge. Left eye exhibits no discharge. No scleral icterus.  Neck: Normal range of motion. Neck supple. No tracheal deviation present. No thyromegaly present.  Cardiovascular: Normal rate, regular rhythm, normal heart sounds and intact distal pulses.  Exam reveals no gallop and no friction rub.   No murmur heard. Pulmonary/Chest: Effort normal and breath sounds normal. No respiratory distress. She has no wheezes. She has no rales. She exhibits no tenderness.  Musculoskeletal: Normal range of motion. She exhibits no edema or tenderness.  Lymphadenopathy:    She has no cervical adenopathy.  Neurological: She is alert and oriented to person, place, and time. No cranial nerve deficit. She exhibits normal muscle tone. Coordination normal.  Skin: Skin is warm and dry. No rash noted. She is not diaphoretic. No erythema. No pallor.  Psychiatric: She has a normal mood and affect. Her behavior is normal. Judgment and thought content normal.          Assessment & Plan:   Problem List Items Addressed This Visit      High   Diabetes mellitus type 2, uncontrolled - Primary    Will check A1c with labs today. BG are improved by her report. Continue Lantus.      Relevant Orders   Comprehensive metabolic panel   Hemoglobin A1c   Lipid panel   Microalbumin / creatinine urine ratio   Hypertension    BP Readings from Last 3 Encounters:  05/21/14 187/80  02/18/14 169/80  11/17/13 140/90   BP elevated in clinic today, but much better controlled at home. Will check renal function with labs. Continue Amlodipine  and Lisinopril with prn Hydralazine.        Unprioritized   Obesity (BMI 30-39.9)    Wt Readings from Last 3 Encounters:  05/21/14 191 lb 8 oz (86.864 kg)  02/18/14 187 lb 12 oz (85.163 kg)  11/17/13 183 lb 8 oz (83.235 kg)   Encouraged healthy diet. Gave information on healthy diet.      Pain in joint, pelvic region and thigh    Chronic bilateral hip/thigh pain for over 2 years.  Discussed further evaluation with MRI lumbar spine and possible evaluation with Dr. Yves Dill, however she prefers to hold off for now.          Return in about 3 months (around 08/19/2014) for Recheck of Diabetes.

## 2014-05-21 NOTE — Assessment & Plan Note (Signed)
>>  ASSESSMENT AND PLAN FOR CONTROLLED DIABETES MELLITUS TYPE 2 WITH COMPLICATIONS (HCC) WRITTEN ON 05/21/2014  7:35 AM BY VANNIE, JENNIFER A, MD  Will check A1c with labs today. BG are improved by her report. Continue Lantus .

## 2014-05-21 NOTE — Assessment & Plan Note (Signed)
Chronic bilateral hip/thigh pain for over 2 years. Discussed further evaluation with MRI lumbar spine and possible evaluation with Dr. Yves Dillhasnis, however she prefers to hold off for now.

## 2014-05-21 NOTE — Progress Notes (Signed)
Pre visit review using our clinic review tool, if applicable. No additional management support is needed unless otherwise documented below in the visit note. 

## 2014-05-21 NOTE — Assessment & Plan Note (Signed)
>>  ASSESSMENT AND PLAN FOR HYPERTENSION ASSOCIATED WITH DIABETES (HCC) WRITTEN ON 05/21/2014  7:33 AM BY VANNIE, JENNIFER A, MD  BP Readings from Last 3 Encounters:  05/21/14 187/80  02/18/14 169/80  11/17/13 140/90   BP elevated in clinic today, but much better controlled at home. Will check renal function with labs. Continue Amlodipine  and Lisinopril  with prn Hydralazine .

## 2014-05-21 NOTE — Assessment & Plan Note (Signed)
BP Readings from Last 3 Encounters:  05/21/14 187/80  02/18/14 169/80  11/17/13 140/90   BP elevated in clinic today, but much better controlled at home. Will check renal function with labs. Continue Amlodipine and Lisinopril with prn Hydralazine.

## 2014-05-21 NOTE — Assessment & Plan Note (Signed)
Will check A1c with labs today. BG are improved by her report. Continue Lantus.

## 2014-05-21 NOTE — Assessment & Plan Note (Signed)
Wt Readings from Last 3 Encounters:  05/21/14 191 lb 8 oz (86.864 kg)  02/18/14 187 lb 12 oz (85.163 kg)  11/17/13 183 lb 8 oz (83.235 kg)   Encouraged healthy diet. Gave information on healthy diet.

## 2014-05-26 ENCOUNTER — Encounter: Payer: Self-pay | Admitting: Nurse Practitioner

## 2014-05-26 ENCOUNTER — Ambulatory Visit (INDEPENDENT_AMBULATORY_CARE_PROVIDER_SITE_OTHER): Payer: 59 | Admitting: Nurse Practitioner

## 2014-05-26 VITALS — BP 170/82 | HR 75 | Temp 98.1°F | Resp 14 | Ht 64.0 in | Wt 192.1 lb

## 2014-05-26 DIAGNOSIS — L600 Ingrowing nail: Secondary | ICD-10-CM

## 2014-05-26 MED ORDER — DOXYCYCLINE HYCLATE 100 MG PO TABS: 100.0000 mg | ORAL_TABLET | Freq: Two times a day (BID) | ORAL | Status: DC

## 2014-05-26 NOTE — Progress Notes (Signed)
Subjective:    Patient ID: Danielle Park, female    DOB: [birth year], 64 y.o.   MRN: [id number]  HPI  [name] is a 64 yo female with a CC of foot pain x 4 days.   1) Right great toe pain. Pt states she thought it was an ingrown toenail and picked at the sight. This worsened it and it began to be red and swollen. Pt tried soaking it, clipping the toenail, and putting a salve on it. She reports seeing purulent drainage without odor. She does cover the toe with a band aid and is diabetic with high BS readings over 180 fasting.    Review of Systems  Constitutional: Negative for fever, chills, diaphoresis and fatigue.  Respiratory: Negative for chest tightness, shortness of breath and wheezing.   Cardiovascular: Negative for chest pain, palpitations and leg swelling.  Gastrointestinal: Negative for nausea, vomiting and diarrhea.  Skin: Positive for color change and wound. Negative for rash.       Right great toe medially  Neurological: Negative for dizziness, weakness, numbness and headaches.  Psychiatric/Behavioral: The patient is not nervous/anxious.    Past Medical History  Diagnosis Date  . Chicken pox   . Diabetes mellitus   . Hypertension   . UTI (urinary tract infection)   . Congenital single kidney   . 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 Marital Status: Married    Spouse Name: N/A  . Number of Children: N/A  . Years of Education: N/A   Occupational History  . Not on file.   Social History Main Topics  . Smoking status: Never Smoker   . Smokeless tobacco: Not on file  . Alcohol Use: No  . Drug Use: Not on file  . Sexual Activity: Not on file   Other Topics Concern  . Not on file   Social History Narrative   [place] 1 dog      Work - Administrator, [employer]   Diet - regular diet   Exercise - occasional, very active at work    Past Surgical History  Procedure Laterality Date  . Cesarean  section    . Vaginal delivery    . Renal artery stent  2014    [facility] Wyn Quakerew [facility]    Family History  Problem Relation Age of Onset  . Hypertension Mother   . Diabetes Mother   . Heart disease Mother     s/p CABG x 2  . Hypertension Father   . Cancer Father     lung   . Heart disease Other   . Diabetes Other   . Diabetes Sister   . Heart disease Sister   . Heart disease Sister   . Cancer Sister     colon and ovary?    No Known Allergies  Current Outpatient Prescriptions on File Prior to Visit  Medication Sig Dispense Refill  . amLODipine (NORVASC) 10 MG tablet TAKE ONE TABLET BY MOUTH ONCE DAILY 30 tablet 5  . aspirin 81 MG tablet Take 81 mg by mouth daily.    . furosemide (LASIX) 20 MG tablet Take 20 mg by mouth daily.    Marland Kitchen. glipiZIDE (GLUCOTROL XL) 10 MG 24 hr tablet TAKE ONE TABLET BY MOUTH ONCE DAILY 90 tablet 1  . hydrALAZINE (APRESOLINE) 25 MG tablet Take 25 mg by mouth 2 (two) times  daily as needed.    . Insulin Glargine (LANTUS SOLOSTAR) 100 UNIT/ML Solostar Pen Inject 10 Units into the skin daily at 10 pm. (Patient taking differently: Inject 20 Units into the skin daily at 10 pm. ) 5 pen PRN  . levothyroxine (SYNTHROID, LEVOTHROID) 75 MCG tablet TAKE ONE TABLET BY MOUTH ONCE DAILY BEFORE  BREAKFAST 90 tablet 0  . lisinopril (PRINIVIL,ZESTRIL) 40 MG tablet TAKE ONE TABLET BY MOUTH ONCE DAILY 90 tablet 0  . metoprolol tartrate (LOPRESSOR) 25 MG tablet Take 25 mg by mouth 2 (two) times daily.    Marland Kitchen NOVOFINE 32G X 6 MM MISC USE AS DIRECTED WITH LEVEMIR PEN 100 each 0   No current facility-administered medications on file prior to visit.       Objective:   Physical Exam  Constitutional: She is oriented to person, place, and time. She appears well-developed and well-nourished. No distress.  BP 170/82 mmHg  Pulse 75  Temp(Src) 98.1 F (36.7 C) (Oral)  Resp 14  Ht  (1.626 m)  Wt 192 lb 1.9 oz (87.145 kg)  BMI 32.96 kg/m2  SpO2 98%   HENT:    Head: Normocephalic and atraumatic.  Right Ear: External ear normal.  Left Ear: External ear normal.  Eyes: Right eye exhibits no discharge. Left eye exhibits no discharge. No scleral icterus.  Neck: Normal range of motion. Neck supple.  Cardiovascular: Normal rate, regular rhythm, normal heart sounds and intact distal pulses.  Exam reveals no gallop and no friction rub.   No murmur heard. Pulmonary/Chest: Effort normal and breath sounds normal. No respiratory distress. She has no wheezes. She has no rales. She exhibits no tenderness.  Musculoskeletal: She exhibits edema and tenderness.       Feet:  Neurological: She is alert and oriented to person, place, and time. No cranial nerve deficit. She exhibits normal muscle tone. Coordination normal.  Skin: Skin is warm and dry. No rash noted. She is not diaphoretic.  Psychiatric: She has a normal mood and affect. Her behavior is normal. Judgment and thought content normal.         Assessment & Plan:

## 2014-05-26 NOTE — Progress Notes (Signed)
Pre visit review using our clinic review tool, if applicable. No additional management support is needed unless otherwise documented below in the visit note. 

## 2014-05-26 NOTE — Patient Instructions (Signed)
We will contact you about your referral to Podiatry.   Please take Doxycyline 100 mg twice daily for 10 days.   Okay to soak your toe in warm water.

## 2014-05-30 DIAGNOSIS — L600 Ingrowing nail: Secondary | ICD-10-CM | POA: Insufficient documentation

## 2014-05-30 NOTE — Assessment & Plan Note (Signed)
Referral to Dr. Al CorpusHyatt. Due to diabetes and infection of soft tissue around ingrown toenail will start on Doxycyline 100 mg twice daily for 10 days. Asked pt to take probiotic with the antibiotic. She is agreeable to plan. Dr. Dan HumphreysWalker is also agreeable to plan (helped with my choice of abx due to 1 kidney).

## 2014-06-08 ENCOUNTER — Ambulatory Visit (INDEPENDENT_AMBULATORY_CARE_PROVIDER_SITE_OTHER): Payer: 59 | Admitting: Podiatry

## 2014-06-08 ENCOUNTER — Encounter: Payer: Self-pay | Admitting: Podiatry

## 2014-06-08 ENCOUNTER — Other Ambulatory Visit: Payer: Self-pay | Admitting: Internal Medicine

## 2014-06-08 ENCOUNTER — Ambulatory Visit (INDEPENDENT_AMBULATORY_CARE_PROVIDER_SITE_OTHER): Payer: 59

## 2014-06-08 VITALS — BP 165/77 | HR 59 | Resp 16

## 2014-06-08 DIAGNOSIS — L600 Ingrowing nail: Secondary | ICD-10-CM

## 2014-06-08 DIAGNOSIS — E119 Type 2 diabetes mellitus without complications: Secondary | ICD-10-CM | POA: Diagnosis not present

## 2014-06-08 MED ORDER — AMOXICILLIN-POT CLAVULANATE 875-125 MG PO TABS: 1.0000 | ORAL_TABLET | Freq: Two times a day (BID) | ORAL | Status: DC

## 2014-06-08 MED ORDER — NEOMYCIN-POLYMYXIN-HC 3.5-10000-1 OT SOLN: OTIC | Status: DC

## 2014-06-08 NOTE — Progress Notes (Signed)
   Subjective:    Patient ID: Danielle Park, female    DOB: January 23, 1951, 64 y.o.   MRN: [id number]  HPI Comments: "I have an ingrown"  Patient c/o tender 1st toe right, lateral border, for about 2 weeks. The area is red and swollen. She saw her PCP- Rx'd antibiotics and is now completed those. The toe is better.  Last A1C was 8.1  Toe Pain       Review of Systems  Musculoskeletal: Positive for gait problem.  All other systems reviewed and are negative.      Objective:   Physical Exam: I have reviewed her past medical history medications allergies surgery social history and review of systems. Pulses are strongly palpable bilateral. Decreased sensorium per Semmes-Weinstein monofilament bilateral forefoot. Deep tendon reflexes are intact and muscle strength +5 over 5 dorsiflexion plantar flexes inverters and everters all into the musculature is intact. Orthopedic evaluation demonstrates mild hammertoe deformities flexible in nature with mild valgus deformities of the first metatarsophalangeal joints bilateral recent dramatic. Cutaneous evaluation of his wrist supple well-hydrated cutis the sharp incurvated nail margin granulation tissue malodor and purulence along the fibular border of the hallux right. There is no streaking and no signs of systemic infection.        Assessment & Plan:  Assessment: Diabetes mellitus with diabetic peripheral neuropathy. Abscess ingrown nail. The border of the hallux right.  Plan: Chemical matrixectomy was performed today after local anesthetic was achieved. She tolerated procedure well and I will follow-up with her in 1 week she will also be dispensed Cortisporin Otic to be applied twice daily after soaking as well as Augmentin to be taken twice daily.

## 2014-06-08 NOTE — Patient Instructions (Signed)
Betadine Soak Instructions  Purchase an 8 oz. bottle of BETADINE solution (Povidone)  THE DAY AFTER THE PROCEDURE  Place 1 tablespoon of betadine solution in a quart of warm tap water.  Submerge your foot or feet with outer bandage intact for the initial soak; this will allow the bandage to become moist and wet for easy lift off.  Once you remove your bandage, continue to soak in the solution for 20 minutes.  This soak should be done twice a day.  Next, remove your foot or feet from solution, blot dry the affected area and cover.  You may use a band aid large enough to cover the area or use gauze and tape.  Apply other medications to the area as directed by the doctor such as cortisporin otic solution (ear drops) or neosporin.  IF YOUR SKIN BECOMES IRRITATED WHILE USING THESE INSTRUCTIONS, IT IS OKAY TO SWITCH TO EPSOM SALTS AND WATER OR WHITE VINEGAR AND WATER.     Diabetes and Foot Care Diabetes may cause you to have problems because of poor blood supply (circulation) to your feet and legs. This may cause the skin on your feet to become thinner, break easier, and heal more slowly. Your skin may become dry, and the skin may peel and crack. You may also have nerve damage in your legs and feet causing decreased feeling in them. You may not notice minor injuries to your feet that could lead to infections or more serious problems. Taking care of your feet is one of the most important things you can do for yourself.  HOME CARE INSTRUCTIONS  Wear shoes at all times, even in the house. Do not go barefoot. Bare feet are easily injured.  Check your feet daily for blisters, cuts, and redness. If you cannot see the bottom of your feet, use a mirror or ask someone for help.  Wash your feet with warm water (do not use hot water) and mild soap. Then pat your feet and the areas between your toes until they are completely dry. Do not soak your feet as this can dry your skin.  Apply a moisturizing lotion or  petroleum jelly (that does not contain alcohol and is unscented) to the skin on your feet and to dry, brittle toenails. Do not apply lotion between your toes.  Trim your toenails straight across. Do not dig under them or around the cuticle. File the edges of your nails with an emery board or nail file.  Do not cut corns or calluses or try to remove them with medicine.  Wear clean socks or stockings every day. Make sure they are not too tight. Do not wear knee-high stockings since they may decrease blood flow to your legs.  Wear shoes that fit properly and have enough cushioning. To break in new shoes, wear them for just a few hours a day. This prevents you from injuring your feet. Always look in your shoes before you put them on to be sure there are no objects inside.  Do not cross your legs. This may decrease the blood flow to your feet.  If you find a minor scrape, cut, or break in the skin on your feet, keep it and the skin around it clean and dry. These areas may be cleansed with mild soap and water. Do not cleanse the area with peroxide, alcohol, or iodine.  When you remove an adhesive bandage, be sure not to damage the skin around it.  If you have a wound,  look at it several times a day to make sure it is healing.  Do not use heating pads or hot water bottles. They may burn your skin. If you have lost feeling in your feet or legs, you may not know it is happening until it is too late.  Make sure your health care provider performs a complete foot exam at least annually or more often if you have foot problems. Report any cuts, sores, or bruises to your health care provider immediately. SEEK MEDICAL CARE IF:   You have an injury that is not healing.  You have cuts or breaks in the skin.  You have an ingrown nail.  You notice redness on your legs or feet.  You feel burning or tingling in your legs or feet.  You have pain or cramps in your legs and feet.  Your legs or feet are  numb.  Your feet always feel cold. SEEK IMMEDIATE MEDICAL CARE IF:   There is increasing redness, swelling, or pain in or around a wound.  There is a red line that goes up your leg.  Pus is coming from a wound.  You develop a fever or as directed by your health care provider.  You notice a bad smell coming from an ulcer or wound. Document Released: 03/10/2000 Document Revised: 11/13/2012 Document Reviewed: 08/20/2012 [facility] Patient Information 2015 [employer] [place] [place]. This information is not intended to replace advice given to you by your health care provider. Make sure you discuss any questions you have with your health care provider.

## 2014-06-15 ENCOUNTER — Ambulatory Visit (INDEPENDENT_AMBULATORY_CARE_PROVIDER_SITE_OTHER): Payer: 59 | Admitting: Podiatry

## 2014-06-15 ENCOUNTER — Encounter: Payer: Self-pay | Admitting: Podiatry

## 2014-06-15 DIAGNOSIS — L600 Ingrowing nail: Secondary | ICD-10-CM

## 2014-06-15 NOTE — Patient Instructions (Signed)

## 2014-06-15 NOTE — Progress Notes (Signed)
She presents today for follow-up of matrixectomy fibular border hallux right. She continues to soak in Betadine and warm water. She continues application of Cortisporin otic. A cover twice daily.  Objective: Vital signs are stable she is alert and oriented 3. Pulses are strongly palpable. Hallux nail right demonstrates no erythema edema saline as drainage or odor appears to be healing quite nicely there is a scab present.  Assessment: Well-healed matrixectomy fibular border right.  Plan: Discontinue the use of the Betadine and start with Epsom salts and warm water soaks continue to apply Cortisporin otic twice daily. Cover during the day and leave open at night

## 2014-07-14 ENCOUNTER — Other Ambulatory Visit: Payer: Self-pay | Admitting: *Deleted

## 2014-07-14 MED ORDER — GLIPIZIDE ER 10 MG PO TB24: ORAL_TABLET | ORAL | Status: DC

## 2014-07-17 NOTE — Op Note (Signed)
PATIENT NAME:  Danielle Park, Danielle Park MR#:  [id number] DATE OF BIRTH:  [birth year]  DATE OF PROCEDURE:  05/27/2012  PREOPERATIVE DIAGNOSES: 1.  Right renal artery stenosis and a solitary kidney.  2.  Severe poorly controlled hypertension, on multiple medications.  3.  Diabetes.   POSTOPERATIVE DIAGNOSES: 1.  Right renal artery stenosis and a solitary kidney.  2.  Severe poorly controlled hypertension, on multiple medications.  3.  Diabetes.   PROCEDURES: 1.  Ultrasound guidance for vascular access, right femoral artery.  2.  Catheter placement into right renal artery from right femoral approach.  3.  Aortogram and selective right renal angiogram.  4.  Pressure measurements of right renal artery and aorta.  5.  Balloon expandable stent placement in the larger, superior of 2 right renal arteries using a 6 mm diameter x 18 mm length balloon expandable stent with the use of the Medtronic guard wire embolic protection device.  6.  StarClose closure device, right femoral artery.   SURGEON: Annice Needy, M.D.   ANESTHESIA: Local with moderate conscious sedation.   ESTIMATED BLOOD LOSS: 25 mL.  FLUOROSCOPY TIME: 5 minutes.   CONTRAST: Approximately 45 mL.   INDICATION FOR PROCEDURE: This is a 64 year old white female with severe poorly controlled hypertension, on multiple medications. She has a blood pressure of greater than 250 systolic, as her highest known blood pressure, and noninvasive study had suggested right renal artery stenosis and a solitary kidney. Risks and benefits were discussed and the patient desired to proceed with angiography and possible revascularization for hopes of improvement of her blood pressure control. Informed consent was obtained.   DESCRIPTION OF PROCEDURE: The patient was brought to the vascular and interventional radiology suite. Groins were shaved and prepped and a sterile surgical field was created. Due to poor anatomic landmarks, ultrasound was used to visualize a  patent right femoral artery. This was accessed under direct ultrasound guidance without difficulty with a Seldinger needle and a permanent image was recorded. A 5-French sheath was then placed. Pigtail catheter was placed, in the aorta, at the L1 level, and AP aortogram was performed. This demonstrated a patent aorta and common iliac artery system. There was a solitary right kidney, which was quite large. There were 2 right renal arteries, the superior one being the larger of the 2, although the inferior one was also a reasonably sized vessel. The inferior of the 2 renal arteries seemed to have less than 30% stenosis and was clearly not hemodynamically significant. The superior of the 2 had what appeared to be a moderate stenosis and there was question of whether or not this was hemodynamically significant. To answer this question, this was selectively cannulated with a C2 catheter. We exchanged for a straight Glide catheter and measured pressures from the distal superior right renal artery back to the aorta. On measuring pressures, in the distal right renal artery, the pressure was about 103/51 and in the aorta the pressures were in the 160 systolic range over about 60. This represented about a 60 mmHg gradient, which is clearly hemodynamically significant. With this knowledge, treatment of this renal artery was recommended. The patient was given 4000 units of intravenous heparin and 12.5 grams of mannitol. A 6-French sheath was placed and a 6-IM guide cath was used to cannulate the superior right renal artery in question. I was able to cross the lesion without difficulty with the Medtronic guard wire embolic protection device and parked this in the distal main renal artery. This was  inflated to 6 mm and complete occlusion was obtained. The vessel measured in the 5.5 to 6 mm range on measurements. A  6 mm in diameter x 18 mm in length balloon expandable stent was then placed through the guide sheath, parked in the  proximal right renal artery and deployed encompassing the lesion. This was deployed in excellent location with a tight waste taken, which resolved with stent placement. The aspiration catheter was then used to aspirate out 2 syringes of blood and this area was flushed with heparinized saline. The embolic protection device was then deflated. Total inflation time was about 4 to 5 minutes and completion angiogram performed showed a widely patent right renal artery stent with a significantly improved flow and a good nephrogram. At this point, I elected to terminate the procedure. The guide catheter was removed, oblique arteriogram was performed, and StarClose closure device       was deployed in the usual fashion with excellent hemostatic result. The patient tolerated the procedure well and was taken to the recovery room in stable condition.  ____________________________ Annice NeedyJason S. Dew, MD jsd:sb D: 05/27/2012 11:17:23 ET T: 05/27/2012 11:36:09 ET JOB#: [id number]  cc: Annice NeedyJason S. Dew, MD, <Dictator> Danielle PitmanJennifer A. Dan HumphreysWalker, MD Annice NeedyJASON S DEW MD ELECTRONICALLY SIGNED 05/30/2012 9:42

## 2014-07-17 NOTE — Op Note (Signed)
PATIENT NAME:  Danielle Park, Danielle Park MR#:  [id number] DATE OF BIRTH:  02-22-1951  DATE OF PROCEDURE:  07/29/2012  PREOPERATIVE DIAGNOSES: 1.  Right renal artery stenosis, and a solitary kidney.  2.  Severe poorly-controlled hypertension.  3.  Diabetes.   POSTOPERATIVE DIAGNOSES: 1.  Right renal artery stenosis, and a solitary kidney.  2.  Severe poorly-controlled hypertension.  3.  Diabetes.   PROCEDURES: 1.  Ultrasound guidance for vascular access, right femoral artery.  2.  Catheter placement into right renal artery from right femoral approach.  3.  Aortogram and selective right renal arteriogram.  4. Percutaneous transluminal angioplasty of superior of 2 right renal arteries with 5 mm diameter angioplasty balloon  5. StarClose closure of right femoral artery.   SURGEON: Annice Needy, M.D.   ANESTHESIA: Local, with moderate conscious sedation.   BLOOD LOSS: Minimal.   CONTRAST USED: 22 mL of Visipaque.   FLUOROSCOPY TIME: Approximately 3 minutes.   INDICATION FOR PROCEDURE: A 64 year old white female with severe, poorly-controlled hypertension. She had a renal artery stent placed several months ago. This initially resulted in improved blood pressure, however over the past week or so she has had worsening blood pressure and this has prompted an Emergency Room visit. She has had blood pressure readings of over 200 systolic and a high clinical suspicion led [place] to proceed with angiography today. Risks and benefits were discussed. Informed consent was obtained.   DESCRIPTION OF PROCEDURE: The patient was brought to the vascular and interventional radiology suite. The groins were shaved and prepped and a sterile surgical field was created. The right femoral head was localized with fluoroscopy. Ultrasound was used to visualize a patent right femoral artery. This was accessed under direct ultrasound guidance without difficulty with a Seldinger needle. A J wire and a 5-French sheath were placed.  Pigtail catheter was placed into the aorta at the L1 level just above the previously-placed right renal artery stent, which was in the superior of the 2 right renal arteries, and imaging was performed. This showed the lower of the 2 renal arteries to be widely patent, without significant stenosis. The more superior of the  2 right renal arteries with the stent in place appeared to have some sluggish flow, and flow abnormalities in the proximal portion of the right renal artery stent. This did ultimately opacify. The degree of stenosis was difficult to elucidate, but it appeared to be, at least, in the moderate range.   I systemically heparinized the patient, and gave 12.5 grams of mannitol. I used a rim catheter and an 0.018 wire to easily cross the lesion. Selective imaging was performed with a rim catheter, but it was a little difficult to elucidate as this was actually beyond the location of the stenosis. The remainder of the distal portion of the stent and the remainder of the renal artery were widely patent. Over the 0.018 wire a 5 mm diameter angioplasty balloon was advanced and inflated. This encompassed the previously-placed stent. It was back into the aorta. A waist was taken at this proximal portion of the stent. We held the inflation for 1 minute. We deflated the balloon and then performed a completion angiogram. Completion angiogram showed this area now to be widely-patent, with brisk flow through the renal artery. I elected to terminate the procedure. The sheath was exchanged for a StarClose sheath and a StarClose closure device was deployed in the usual fashion, with excellent hemostatic result. The patient tolerated the procedure well and was taken  to the recovery room in stable condition.    ____________________________ Annice NeedyJason S. Lurena Naeve, MD jsd:dm D: 07/29/2012 15:06:25 ET T: 07/29/2012 15:36:45 ET JOB#: [id number]  cc: Annice NeedyJason S. Nimai Burbach, MD, <Dictator> Ginette PitmanJennifer A. Dan HumphreysWalker, MD Annice NeedyJASON S Debar Plate  MD ELECTRONICALLY SIGNED 08/01/2012 15:52

## 2014-07-27 ENCOUNTER — Other Ambulatory Visit: Payer: Self-pay | Admitting: Internal Medicine

## 2014-08-03 ENCOUNTER — Other Ambulatory Visit: Payer: Self-pay | Admitting: Internal Medicine

## 2014-08-19 ENCOUNTER — Ambulatory Visit (INDEPENDENT_AMBULATORY_CARE_PROVIDER_SITE_OTHER): Payer: 59 | Admitting: Internal Medicine

## 2014-08-19 ENCOUNTER — Encounter: Payer: Self-pay | Admitting: Internal Medicine

## 2014-08-19 VITALS — BP 169/72 | HR 58 | Temp 97.9°F | Ht 64.0 in | Wt 194.0 lb

## 2014-08-19 DIAGNOSIS — E1165 Type 2 diabetes mellitus with hyperglycemia: Secondary | ICD-10-CM

## 2014-08-19 DIAGNOSIS — I10 Essential (primary) hypertension: Secondary | ICD-10-CM

## 2014-08-19 DIAGNOSIS — M25551 Pain in right hip: Secondary | ICD-10-CM | POA: Diagnosis not present

## 2014-08-19 DIAGNOSIS — E039 Hypothyroidism, unspecified: Secondary | ICD-10-CM

## 2014-08-19 DIAGNOSIS — IMO0002 Reserved for concepts with insufficient information to code with codable children: Secondary | ICD-10-CM

## 2014-08-19 LAB — COMPREHENSIVE METABOLIC PANEL
ALT: 8 U/L (ref 0–35)
AST: 14 U/L (ref 0–37)
Albumin: 4 g/dL (ref 3.5–5.2)
Alkaline Phosphatase: 147 U/L — ABNORMAL HIGH (ref 39–117)
BUN: 12 mg/dL (ref 6–23)
CALCIUM: 9.3 mg/dL (ref 8.4–10.5)
CO2: 27 mEq/L (ref 19–32)
Chloride: 101 mEq/L (ref 96–112)
Creatinine, Ser: 0.83 mg/dL (ref 0.40–1.20)
GFR: 73.65 mL/min (ref >60.00)
Glucose, Bld: 238 mg/dL — ABNORMAL HIGH (ref 70–99)
POTASSIUM: 4.4 meq/L (ref 3.5–5.1)
Sodium: 135 mEq/L (ref 135–145)
TOTAL PROTEIN: 7.4 g/dL (ref 6.0–8.3)
Total Bilirubin: 0.5 mg/dL (ref 0.2–1.2)

## 2014-08-19 LAB — HEMOGLOBIN A1C: HEMOGLOBIN A1C: 8.5 % — AB (ref 4.6–6.5)

## 2014-08-19 LAB — POCT URINALYSIS DIPSTICK
Bilirubin, UA: NEGATIVE
Blood, UA: NEGATIVE
GLUCOSE UA: NEGATIVE
KETONES UA: NEGATIVE
Nitrite, UA: NEGATIVE
PH UA: 5.5
Protein, UA: NEGATIVE
Spec Grav, UA: <=1.005
UROBILINOGEN UA: 0.2

## 2014-08-19 LAB — TSH: TSH: 4.2 u[IU]/mL (ref 0.35–4.50)

## 2014-08-19 MED ORDER — INSULIN GLARGINE 100 UNIT/ML SOLOSTAR PEN: 30.0000 [IU] | PEN_INJECTOR | Freq: Every day | SUBCUTANEOUS | Status: DC

## 2014-08-19 NOTE — Assessment & Plan Note (Signed)
>>  ASSESSMENT AND PLAN FOR HYPERTENSION ASSOCIATED WITH DIABETES (HCC) WRITTEN ON 08/19/2014  7:30 AM BY VANNIE, JENNIFER A, MD  BP Readings from Last 3 Encounters:  08/19/14 169/72  06/08/14 165/77  05/26/14 170/82   BP slightly elevated, but much better controlled at home. Will continue current medications. Renal function with labs.

## 2014-08-19 NOTE — Assessment & Plan Note (Signed)
BG elevated by her report. Encouraged her to limit sweets. Will check A1c with labs. Increase Lantus to 30units daily.

## 2014-08-19 NOTE — Progress Notes (Signed)
Subjective:    Patient ID: Danielle Park, female    DOB: 1951-02-05, 64 y.o.   MRN: [id number]  HPI  64YO female presents for follow up.  Last seen 2/25 for DM. A1c 8.9%.  BG elevated last few weeks. Taking 25units of Lantus once daily. BG this week in the 200s. Notes some dietary indiscretion.  HTN - BP at home typically 140s/60s. Compliant with medication.  Right hip pain - started over 2 years ago. Now also has pain on left side. Aching pain. Worse with walking. No weakness. No numbness.  Past medical, surgical, family and social history per today's encounter.  Review of Systems  Constitutional: Negative for fever, chills, appetite change, fatigue and unexpected weight change.  Eyes: Negative for visual disturbance.  Respiratory: Negative for shortness of breath.   Cardiovascular: Negative for chest pain and leg swelling.  Gastrointestinal: Negative for abdominal pain, diarrhea and constipation.  Musculoskeletal: Positive for myalgias and arthralgias. Negative for back pain.  Skin: Negative for color change and rash.  Hematological: Negative for adenopathy. Does not bruise/bleed easily.  Psychiatric/Behavioral: Negative for dysphoric mood. The patient is not nervous/anxious.        Objective:    BP 169/72 mmHg  Pulse 58  Temp(Src) 97.9 F (36.6 C) (Oral)  Ht  (1.626 m)  Wt 194 lb (87.998 kg)  BMI 33.28 kg/m2  SpO2 99% Physical Exam  Constitutional: She is oriented to person, place, and time. She appears well-developed and well-nourished. No distress.  HENT:  Head: Normocephalic and atraumatic.  Right Ear: External ear normal.  Left Ear: External ear normal.  Nose: Nose normal.  Mouth/Throat: Oropharynx is clear and moist. No oropharyngeal exudate.  Eyes: Conjunctivae are normal. Pupils are equal, round, and reactive to light. Right eye exhibits no discharge. Left eye exhibits no discharge. No scleral icterus.  Neck: Normal range of motion. Neck supple. No  tracheal deviation present. No thyromegaly present.  Cardiovascular: Normal rate, regular rhythm, normal heart sounds and intact distal pulses.  Exam reveals no gallop and no friction rub.   No murmur heard. Pulmonary/Chest: Effort normal and breath sounds normal. No respiratory distress. She has no wheezes. She has no rales. She exhibits no tenderness.  Musculoskeletal: Normal range of motion. She exhibits no edema or tenderness.       Right hip: She exhibits normal range of motion, normal strength and no tenderness.  Lymphadenopathy:    She has no cervical adenopathy.  Neurological: She is alert and oriented to person, place, and time. No cranial nerve deficit. She exhibits normal muscle tone. Coordination normal.  Skin: Skin is warm and dry. No rash noted. She is not diaphoretic. No erythema. No pallor.  Psychiatric: She has a normal mood and affect. Her behavior is normal. Judgment and thought content normal.          Assessment & Plan:   Problem List Items Addressed This Visit      High   Diabetes mellitus type 2, uncontrolled - Primary    BG elevated by her report. Encouraged her to limit sweets. Will check A1c with labs. Increase Lantus to 30units daily.      Relevant Medications   Insulin Glargine (LANTUS SOLOSTAR) 100 UNIT/ML Solostar Pen   Other Relevant Orders   Comprehensive metabolic panel   Hemoglobin A1c   POCT Urinalysis Dipstick   Hypertension    BP Readings from Last 3 Encounters:  08/19/14 169/72  06/08/14 165/77  05/26/14 170/82   BP  slightly elevated, but much better controlled at home. Will continue current medications. Renal function with labs.        Unprioritized   Hypothyroidism    Will check TSH with labs. Continue Levothyroxine.      Relevant Orders   TSH   Right hip pain    Chronic right hip pain > 2 years. Limiting activity because of pain. Not taking any pain medication. Pain now also noted in left lateral hip on occasion. Previous  right hip plain film was normal. Question if DJD in lumbar spine may be contributing. Will set up evaluation with Dr. Katrinka BlazingSmith.      Relevant Orders   Ambulatory referral to Sports Medicine       Return in about 3 months (around 11/19/2014) for Recheck of Diabetes.

## 2014-08-19 NOTE — Assessment & Plan Note (Signed)
>>  ASSESSMENT AND PLAN FOR CONTROLLED DIABETES MELLITUS TYPE 2 WITH COMPLICATIONS (HCC) WRITTEN ON 08/19/2014  7:31 AM BY WALKER, JENNIFER A, MD  BG elevated by her report. Encouraged her to limit sweets. Will check A1c with labs. Increase Lantus  to 30units daily.

## 2014-08-19 NOTE — Assessment & Plan Note (Signed)
BP Readings from Last 3 Encounters:  08/19/14 169/72  06/08/14 165/77  05/26/14 170/82   BP slightly elevated, but much better controlled at home. Will continue current medications. Renal function with labs.

## 2014-08-19 NOTE — Assessment & Plan Note (Signed)
Chronic right hip pain > 2 years. Limiting activity because of pain. Not taking any pain medication. Pain now also noted in left lateral hip on occasion. Previous right hip plain film was normal. Question if DJD in lumbar spine may be contributing. Will set up evaluation with Dr. Katrinka BlazingSmith.

## 2014-08-19 NOTE — Assessment & Plan Note (Signed)
Will check TSH with labs. Continue Levothyroxine. 

## 2014-08-19 NOTE — Progress Notes (Signed)
Pre visit review using our clinic review tool, if applicable. No additional management support is needed unless otherwise documented below in the visit note. 

## 2014-08-19 NOTE — Patient Instructions (Addendum)
Increase Lantus to 30units daily.  Continue to monitor blood sugars 1-2 times daily.  We will set up evaluation with Dr. Katrinka BlazingSmith in Sports Medicine.

## 2014-08-31 ENCOUNTER — Other Ambulatory Visit: Payer: Self-pay | Admitting: Internal Medicine

## 2014-11-19 ENCOUNTER — Ambulatory Visit (INDEPENDENT_AMBULATORY_CARE_PROVIDER_SITE_OTHER): Payer: 59 | Admitting: Internal Medicine

## 2014-11-19 ENCOUNTER — Encounter: Payer: Self-pay | Admitting: Internal Medicine

## 2014-11-19 VITALS — BP 171/73 | HR 63 | Temp 98.1°F | Ht 64.0 in | Wt 196.0 lb

## 2014-11-19 DIAGNOSIS — E669 Obesity, unspecified: Secondary | ICD-10-CM

## 2014-11-19 DIAGNOSIS — E1165 Type 2 diabetes mellitus with hyperglycemia: Secondary | ICD-10-CM

## 2014-11-19 DIAGNOSIS — I701 Atherosclerosis of renal artery: Secondary | ICD-10-CM | POA: Diagnosis not present

## 2014-11-19 DIAGNOSIS — I10 Essential (primary) hypertension: Secondary | ICD-10-CM

## 2014-11-19 DIAGNOSIS — IMO0002 Reserved for concepts with insufficient information to code with codable children: Secondary | ICD-10-CM

## 2014-11-19 LAB — MICROALBUMIN / CREATININE URINE RATIO
Creatinine,U: 47.4 mg/dL
MICROALB/CREAT RATIO: 2.7 mg/g (ref 0.0–30.0)
Microalb, Ur: 1.3 mg/dL (ref 0.0–1.9)

## 2014-11-19 LAB — HEMOGLOBIN A1C: HEMOGLOBIN A1C: 8.6 % — AB (ref 4.6–6.5)

## 2014-11-19 LAB — COMPREHENSIVE METABOLIC PANEL
ALBUMIN: 3.8 g/dL (ref 3.5–5.2)
ALT: 9 U/L (ref 0–35)
AST: 17 U/L (ref 0–37)
Alkaline Phosphatase: 122 U/L — ABNORMAL HIGH (ref 39–117)
BUN: 14 mg/dL (ref 6–23)
CHLORIDE: 102 meq/L (ref 96–112)
CO2: 26 meq/L (ref 19–32)
CREATININE: 0.88 mg/dL (ref 0.40–1.20)
Calcium: 9.1 mg/dL (ref 8.4–10.5)
GFR: 68.79 mL/min (ref >60.00)
GLUCOSE: 200 mg/dL — AB (ref 70–99)
Potassium: 4.5 mEq/L (ref 3.5–5.1)
SODIUM: 135 meq/L (ref 135–145)
Total Bilirubin: 0.4 mg/dL (ref 0.2–1.2)
Total Protein: 7.5 g/dL (ref 6.0–8.3)

## 2014-11-19 MED ORDER — GLIPIZIDE ER 10 MG PO TB24: ORAL_TABLET | ORAL | Status: DC

## 2014-11-19 MED ORDER — LEVOTHYROXINE SODIUM 75 MCG PO TABS: ORAL_TABLET | ORAL | Status: DC

## 2014-11-19 MED ORDER — LISINOPRIL 40 MG PO TABS: 40.0000 mg | ORAL_TABLET | Freq: Every day | ORAL | Status: DC

## 2014-11-19 NOTE — Progress Notes (Signed)
Subjective:    Patient ID: Danielle Park, female    DOB: 1950-12-29, 64 y.o.   MRN: [id number]  HPI  64YO female presents for follow up.  DM - BG fairly well controlled. Mostly upper 100s, few readings near 200. Compliant with medication. No lows.  HTN - BP mostly 140s/60s at home. Compliant with medications.  Would like to get re-established with vascular. Has not seen them in over 1 year because of insurance coverage.   BP Readings from Last 3 Encounters:  11/19/14 171/73  08/19/14 169/72  06/08/14 165/77     Past Medical History  Diagnosis Date  . Chicken pox   . Diabetes mellitus   . Hypertension   . UTI (urinary tract infection)   . Congenital single kidney   . 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 Hypertension Mother   . Diabetes Mother   . Heart disease Mother     s/p CABG x 2  . Hypertension Father   . Cancer Father     lung   . Heart disease Other   . Diabetes Other   . Diabetes Sister   . Heart disease Sister   . Heart disease Sister   . Cancer Sister     colon and ovary?   Past Surgical History  Procedure Laterality Date  . Cesarean section    . Vaginal delivery    . Renal artery stent  2014    Dr. Wyn Quaker at [facility]   Social History   Social History  . Marital Status: Married    Spouse Name: N/A  . Number of Children: N/A  . Years of Education: N/A   Social History Main Topics  . Smoking status: Never Smoker   . Smokeless tobacco: None  . Alcohol Use: No  . Drug Use: None  . Sexual Activity: Not Asked   Other Topics Concern  . None   Social History Narrative   Lives in [place] with husband. 1 dog      Work - Administrator, [employer]   Diet - regular diet   Exercise - occasional, very active at work    Review of Systems  Constitutional: Negative for fever, chills, appetite change, fatigue and unexpected weight change.  Eyes: Negative for visual  disturbance.  Respiratory: Negative for shortness of breath.   Cardiovascular: Negative for chest pain and leg swelling.  Gastrointestinal: Negative for nausea, vomiting, abdominal pain, diarrhea and constipation.  Musculoskeletal: Negative for myalgias and arthralgias.  Skin: Negative for color change and rash.  Hematological: Negative for adenopathy. Does not bruise/bleed easily.  Psychiatric/Behavioral: Negative for sleep disturbance and dysphoric mood. The patient is not nervous/anxious.        Objective:    BP 171/73 mmHg  Pulse 63  Temp(Src) 98.1 F (36.7 C) (Oral)  Ht  (1.626 m)  Wt 196 lb (88.905 kg)  BMI 33.63 kg/m2  SpO2 97% Physical Exam  Constitutional: She is oriented to person, place, and time. She appears well-developed and well-nourished. No distress.  HENT:  Head: Normocephalic and atraumatic.  Right Ear: External ear normal.  Left Ear: External ear normal.  Nose: Nose normal.  Mouth/Throat: Oropharynx is clear and moist. No oropharyngeal exudate.  Eyes: Conjunctivae are normal. Pupils are equal, round, and reactive to light. Right eye exhibits no discharge. Left eye exhibits no discharge. No scleral icterus.  Neck:  Normal range of motion. Neck supple. No tracheal deviation present. No thyromegaly present.  Cardiovascular: Normal rate, regular rhythm, normal heart sounds and intact distal pulses.  Exam reveals no gallop and no friction rub.   No murmur heard. Pulmonary/Chest: Effort normal and breath sounds normal. No respiratory distress. She has no wheezes. She has no rales. She exhibits no tenderness.  Musculoskeletal: Normal range of motion. She exhibits no edema or tenderness.  Lymphadenopathy:    She has no cervical adenopathy.  Neurological: She is alert and oriented to person, place, and time. No cranial nerve deficit. She exhibits normal muscle tone. Coordination normal.  Skin: Skin is warm and dry. No rash noted. She is not diaphoretic. No  erythema. No pallor.  Psychiatric: She has a normal mood and affect. Her behavior is normal. Judgment and thought content normal.          Assessment & Plan:   Problem List Items Addressed This Visit      High   Diabetes mellitus type 2, uncontrolled - Primary    BG improved some. Will recheck A1c with labs.      Relevant Medications   lisinopril (PRINIVIL,ZESTRIL) 40 MG tablet   glipiZIDE (GLUCOTROL XL) 10 MG 24 hr tablet   Other Relevant Orders   Comprehensive metabolic panel   Hemoglobin A1c   Microalbumin / creatinine urine ratio   Hypertension    BP Readings from Last 3 Encounters:  11/19/14 171/73  08/19/14 169/72  06/08/14 165/77   BP continues to be elevated. Recommended re-imaging of RAS. Will set up vascular evaluation.      Relevant Medications   lisinopril (PRINIVIL,ZESTRIL) 40 MG tablet     Unprioritized   Obesity (BMI 30-39.9)    Wt Readings from Last 3 Encounters:  11/19/14 196 lb (88.905 kg)  08/19/14 194 lb (87.998 kg)  05/26/14 192 lb 1.9 oz (87.145 kg)   Body mass index is 33.63 kg/(m^2). Encouraged healthy diet and activity      Relevant Medications   glipiZIDE (GLUCOTROL XL) 10 MG 24 hr tablet   Renal artery stenosis    Will set up follow up with vascular for repeat imaging.      Relevant Medications   lisinopril (PRINIVIL,ZESTRIL) 40 MG tablet   Other Relevant Orders   Ambulatory referral to Vascular Surgery       Return in about 3 months (around 02/19/2015) for Recheck.

## 2014-11-19 NOTE — Assessment & Plan Note (Signed)
BP Readings from Last 3 Encounters:  11/19/14 171/73  08/19/14 169/72  06/08/14 165/77   BP continues to be elevated. Recommended re-imaging of RAS. Will set up vascular evaluation.

## 2014-11-19 NOTE — Assessment & Plan Note (Signed)
Wt Readings from Last 3 Encounters:  11/19/14 196 lb (88.905 kg)  08/19/14 194 lb (87.998 kg)  05/26/14 192 lb 1.9 oz (87.145 kg)   Body mass index is 33.63 kg/(m^2). Encouraged healthy diet and activity

## 2014-11-19 NOTE — Assessment & Plan Note (Signed)
>>  ASSESSMENT AND PLAN FOR HYPERTENSION ASSOCIATED WITH DIABETES (HCC) WRITTEN ON 11/19/2014  7:42 AM BY VANNIE, JENNIFER A, MD  BP Readings from Last 3 Encounters:  11/19/14 171/73  08/19/14 169/72  06/08/14 165/77   BP continues to be elevated. Recommended re-imaging of RAS. Will set up vascular evaluation.

## 2014-11-19 NOTE — Progress Notes (Signed)
Pre visit review using our clinic review tool, if applicable. No additional management support is needed unless otherwise documented below in the visit note. 

## 2014-11-19 NOTE — Assessment & Plan Note (Signed)
Will set up follow up with vascular for repeat imaging.

## 2014-11-19 NOTE — Patient Instructions (Addendum)
Labs today.  We will set up evaluation with Dr. Wyn Quaker.  Follow up here in 3 months.

## 2014-11-19 NOTE — Assessment & Plan Note (Signed)
BG improved some. Will recheck A1c with labs.

## 2014-11-19 NOTE — Assessment & Plan Note (Signed)
>>  ASSESSMENT AND PLAN FOR CONTROLLED DIABETES MELLITUS TYPE 2 WITH COMPLICATIONS (HCC) WRITTEN ON 11/19/2014  7:42 AM BY WALKER, JENNIFER A, MD  BG improved some. Will recheck A1c with labs.

## 2014-12-16 ENCOUNTER — Ambulatory Visit (INDEPENDENT_AMBULATORY_CARE_PROVIDER_SITE_OTHER)
Admission: RE | Admit: 2014-12-16 | Discharge: 2014-12-16 | Disposition: A | Discharge status: Home / Self Care | Payer: 59 | Source: Ambulatory Visit | Attending: Family Medicine | Admitting: Family Medicine

## 2014-12-16 ENCOUNTER — Ambulatory Visit (INDEPENDENT_AMBULATORY_CARE_PROVIDER_SITE_OTHER): Payer: 59 | Admitting: Family Medicine

## 2014-12-16 ENCOUNTER — Encounter: Payer: Self-pay | Admitting: Family Medicine

## 2014-12-16 ENCOUNTER — Other Ambulatory Visit: Payer: Self-pay | Admitting: Family Medicine

## 2014-12-16 VITALS — BP 138/86 | HR 63 | Ht 64.0 in | Wt 196.0 lb

## 2014-12-16 DIAGNOSIS — M25551 Pain in right hip: Secondary | ICD-10-CM

## 2014-12-16 DIAGNOSIS — I739 Peripheral vascular disease, unspecified: Secondary | ICD-10-CM

## 2014-12-16 DIAGNOSIS — M79604 Pain in right leg: Secondary | ICD-10-CM

## 2014-12-16 DIAGNOSIS — M79605 Pain in left leg: Secondary | ICD-10-CM

## 2014-12-16 MED ORDER — GABAPENTIN 100 MG PO CAPS: 100.0000 mg | ORAL_CAPSULE | Freq: Every day | ORAL | Status: DC

## 2014-12-16 NOTE — Assessment & Plan Note (Signed)
Patient's hip pain is multifactorial. I do think that there could be some underlying arthritis and x-rays ordered today. Patient has minimal degenerative changes previously. Patient also could have lumbar radiculopathy but has a negative straight leg test today. History of renal artery stenosis and could be vascular compromise and will start with ABI. May need further imaging if we do not have any findings on x-rays. Discussed HEP and worked with ATC.  Patient could also have some neurologic compromise from the spinal stenosis. We will await the imaging. May need advanced imaging if it continues. Interesting out only happens with ambulation which makes me think and vascular compromise. Patient come back again in 4 weeks for further evaluation.

## 2014-12-16 NOTE — Progress Notes (Signed)
[facility] [address] [place], [place] [address] Phone: [phone] Subjective:    I'm seeing this patient by the request  of:  Wynona Dove, MD   CC: right hip pain  [id number] Danielle Park is a 64 y.o. female coming in with complaint of right hip pain.patient does have a past medical history significant for degenerative disc disease of the lumbar spine. Patient states that she has had this pain for proximal he 2 years. States that it does limit her from certain activities. Patient describes the pain as dull throbbing aching sensation that seems to be in mostly in the right groin as well as going down the anterior thigh. Patient states though that unfortunately can happen bilaterally. Patient states that sitting or even steaming doesn't give her too much pain is only with the ambulation itself. Patient denies that this pain wakes her up at night but she does wake up a lot of night. Sometimes feels that her legs are heavy in addition to her pain. Patient denies that her legs or given out on her. Patient does do a lot of lifting and bending and stooping but states that only mild discomfort with these activities.  Past Medical History  Diagnosis Date  . Chicken pox   . Diabetes mellitus   . Hypertension   . UTI (urinary tract infection)   . Congenital single kidney   . 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 Cesarean section    . Vaginal delivery    . Renal artery stent  2014    Dr. Wyn Quaker at [facility]   Social History  Substance Use Topics  . Smoking status: Never Smoker   . Smokeless tobacco: None  . Alcohol Use: No   No Known Allergies Family History  Problem Relation Age of Onset  . Hypertension Mother   . Diabetes Mother   . Heart disease Mother     s/p CABG x 2  . Hypertension Father   . Cancer Father     lung   . Heart disease Other    . Diabetes Other   . Diabetes Sister   . Heart disease Sister   . Heart disease Sister   . Cancer Sister     colon and ovary?      last x-rays the patient's hip with 2014. Patient did not have any arthritis of the hip joint itself but did have significant sclerosis of the pubic symphysis.  Past medical history, social, surgical and family history all reviewed in electronic medical record.   Review of Systems: No headache, visual changes, nausea, vomiting, diarrhea, constipation, dizziness, abdominal pain, skin rash, fevers, chills, night sweats, weight loss, swollen lymph nodes, body aches, joint swelling, muscle aches, chest pain, shortness of breath, mood changes.   Objective Blood pressure 138/86, pulse 63, height  (1.626 m), weight 196 lb (88.905 kg), SpO2 97 %.  General: No apparent distress alert and oriented x3 mood and affect normal, dressed appropriately.  HEENT: Pupils equal, extraocular movements intact  Respiratory: Patient's speak in full sentences and does not appear short of breath  Cardiovascular: No lower extremity edema, non tender, no erythema  Skin: Warm dry intact with no signs of infection or rash on extremities or on axial skeleton.  Abdomen: Soft nontender  Neuro: Cranial nerves II through XII are intact, neurovascularly intact in all  extremities with 2+ DTRs and 2+ pulses.  Lymph: No lymphadenopathy of posterior or anterior cervical chain or axillae bilaterally.  Gait normal with good balance and coordination.  MSK:  Non tender with full range of motion and good stability and symmetric strength and tone of shoulders, elbows, wrist,  knee and ankles bilaterally.  Hip: right ROM IR: 15 Deg With pain , ER: 45 Deg, Flexion: 120 Deg, Extension: 100 Deg, Abduction: 45 Deg, Adduction: 45 Deg Strength IR: 5/5, ER: 5/5, Flexion: 5/5, Extension: 4/5, Abduction: 4/5, Adduction: 5/5 Pelvic alignment unremarkable to inspection and palpation. Standing hip rotation  and gait without trendelenburg sign / unsteadiness. Greater trochanter without tenderness to palpation. No tenderness over piriformis and greater trochanter. Positive pain with internal rotation but negative Faber Mild discomfort of the sacroiliac joint.  Contralateral hip unremarkable    Impression and Recommendations:     This case required medical decision making of moderate complexity.

## 2014-12-16 NOTE — Progress Notes (Signed)
Pre visit review using our clinic review tool, if applicable. No additional management support is needed unless otherwise documented below in the visit note. 

## 2014-12-16 NOTE — Patient Instructions (Addendum)
Good to see you.  Ice 20 minutes 2 times daily. Usually after activity and before bed. Exercises 3 times a week.  We will get xrays today.  The will call you on 1 other test.  Take tylenol 650 mg three times a day is the best evidence based medicine we have for arthritis.  Glucosamine sulfate  a day is a supplement that has been shown to help moderate to severe arthritis. Vitamin D 2000 IU daily Fish oil 2 grams daily.  Tumeric  daily.  It's important that you continue to stay active. Controlling your weight is important. .  Water aerobics and cycling with low resistance are the best two types of exercise for arthritis. Come back and see me in 3-4 weeks.

## 2014-12-18 ENCOUNTER — Ambulatory Visit (HOSPITAL_COMMUNITY)
Admission: RE | Admit: 2014-12-18 | Discharge: 2014-12-18 | Disposition: A | Discharge status: Home / Self Care | Payer: 59 | Source: Ambulatory Visit | Attending: Cardiology | Admitting: Cardiology

## 2014-12-18 ENCOUNTER — Other Ambulatory Visit: Payer: Self-pay | Admitting: Family Medicine

## 2014-12-18 DIAGNOSIS — Z87891 Personal history of nicotine dependence: Secondary | ICD-10-CM | POA: Insufficient documentation

## 2014-12-18 DIAGNOSIS — M79604 Pain in right leg: Secondary | ICD-10-CM | POA: Diagnosis not present

## 2014-12-18 DIAGNOSIS — M79605 Pain in left leg: Principal | ICD-10-CM

## 2014-12-18 DIAGNOSIS — I739 Peripheral vascular disease, unspecified: Secondary | ICD-10-CM

## 2014-12-18 DIAGNOSIS — E785 Hyperlipidemia, unspecified: Secondary | ICD-10-CM | POA: Insufficient documentation

## 2014-12-18 DIAGNOSIS — I10 Essential (primary) hypertension: Secondary | ICD-10-CM | POA: Insufficient documentation

## 2014-12-18 DIAGNOSIS — I70203 Unspecified atherosclerosis of native arteries of extremities, bilateral legs: Secondary | ICD-10-CM | POA: Diagnosis not present

## 2014-12-18 DIAGNOSIS — E119 Type 2 diabetes mellitus without complications: Secondary | ICD-10-CM | POA: Insufficient documentation

## 2014-12-21 ENCOUNTER — Telehealth: Payer: Self-pay

## 2014-12-21 ENCOUNTER — Other Ambulatory Visit: Payer: Self-pay

## 2014-12-21 DIAGNOSIS — M79605 Pain in left leg: Principal | ICD-10-CM

## 2014-12-21 DIAGNOSIS — M79604 Pain in right leg: Secondary | ICD-10-CM

## 2014-12-21 NOTE — Telephone Encounter (Signed)
Spoke with patient and discussed results of test. She will be referred to Dr. Wyn Quaker at [facility].

## 2015-01-08 ENCOUNTER — Other Ambulatory Visit
Admission: RE | Admit: 2015-01-08 | Discharge: 2015-01-08 | Disposition: A | Discharge status: Home / Self Care | Payer: 59 | Source: Ambulatory Visit | Attending: Internal Medicine | Admitting: Internal Medicine

## 2015-01-08 DIAGNOSIS — Z01812 Encounter for preprocedural laboratory examination: Secondary | ICD-10-CM | POA: Insufficient documentation

## 2015-01-08 LAB — BASIC METABOLIC PANEL
Anion gap: 7 (ref 5–15)
BUN: 21 mg/dL — ABNORMAL HIGH (ref 6–20)
CHLORIDE: 105 mmol/L (ref 101–111)
CO2: 28 mmol/L (ref 22–32)
CREATININE: 1.43 mg/dL — AB (ref 0.44–1.00)
Calcium: 9 mg/dL (ref 8.9–10.3)
GFR calc non Af Amer: 38 mL/min — ABNORMAL LOW (ref >60)
GFR, EST AFRICAN AMERICAN: 44 mL/min — AB (ref >60)
Glucose, Bld: 161 mg/dL — ABNORMAL HIGH (ref 65–99)
POTASSIUM: 4 mmol/L (ref 3.5–5.1)
Sodium: 140 mmol/L (ref 135–145)

## 2015-01-10 ENCOUNTER — Encounter: Payer: Self-pay | Admitting: Internal Medicine

## 2015-01-11 ENCOUNTER — Encounter
Admission: RE | Disposition: A | Discharge status: Home / Self Care | Payer: Self-pay | Source: Ambulatory Visit | Attending: Vascular Surgery

## 2015-01-11 ENCOUNTER — Encounter: Payer: Self-pay | Admitting: *Deleted

## 2015-01-11 ENCOUNTER — Ambulatory Visit
Admission: RE | Admit: 2015-01-11 | Discharge: 2015-01-11 | Disposition: A | Discharge status: Home / Self Care | Payer: 59 | Source: Ambulatory Visit | Attending: Vascular Surgery | Admitting: Vascular Surgery

## 2015-01-11 DIAGNOSIS — E119 Type 2 diabetes mellitus without complications: Secondary | ICD-10-CM | POA: Insufficient documentation

## 2015-01-11 DIAGNOSIS — I70213 Atherosclerosis of native arteries of extremities with intermittent claudication, bilateral legs: Secondary | ICD-10-CM | POA: Diagnosis not present

## 2015-01-11 DIAGNOSIS — M25551 Pain in right hip: Secondary | ICD-10-CM | POA: Insufficient documentation

## 2015-01-11 DIAGNOSIS — I10 Essential (primary) hypertension: Secondary | ICD-10-CM | POA: Diagnosis not present

## 2015-01-11 HISTORY — PX: PERIPHERAL VASCULAR CATHETERIZATION: SHX172C

## 2015-01-11 SURGERY — ABDOMINAL AORTOGRAM W/LOWER EXTREMITY
Wound class: Clean

## 2015-01-11 MED ORDER — MIDAZOLAM HCL 5 MG/5ML IJ SOLN
INTRAMUSCULAR | Status: AC
  Filled 2015-01-11: qty 5

## 2015-01-11 MED ORDER — LIDOCAINE-EPINEPHRINE (PF) 1 %-1:200000 IJ SOLN
INTRAMUSCULAR | Status: DC | PRN
  Administered 2015-01-11: 10 mL via INTRADERMAL

## 2015-01-11 MED ORDER — HEPARIN SODIUM (PORCINE) 1000 UNIT/ML IJ SOLN
INTRAMUSCULAR | Status: AC
  Filled 2015-01-11: qty 1

## 2015-01-11 MED ORDER — FENTANYL CITRATE (PF) 100 MCG/2ML IJ SOLN
INTRAMUSCULAR | Status: DC | PRN
  Administered 2015-01-11 (×2): 50 ug via INTRAVENOUS

## 2015-01-11 MED ORDER — MIDAZOLAM HCL 2 MG/2ML IJ SOLN
INTRAMUSCULAR | Status: DC | PRN
  Administered 2015-01-11: 2 mg via INTRAVENOUS
  Administered 2015-01-11: 1 mg via INTRAVENOUS

## 2015-01-11 MED ORDER — CLOPIDOGREL BISULFATE 75 MG PO TABS
75.0000 mg | ORAL_TABLET | Freq: Every day | ORAL | Status: DC
  Administered 2015-01-11: 75 mg via ORAL

## 2015-01-11 MED ORDER — HEPARIN (PORCINE) IN NACL 2-0.9 UNIT/ML-% IJ SOLN
INTRAMUSCULAR | Status: AC
  Filled 2015-01-11: qty 1000

## 2015-01-11 MED ORDER — FENTANYL CITRATE (PF) 100 MCG/2ML IJ SOLN
INTRAMUSCULAR | Status: AC
  Filled 2015-01-11: qty 2

## 2015-01-11 MED ORDER — SODIUM BICARBONATE BOLUS VIA INFUSION
INTRAVENOUS | Status: AC
  Administered 2015-01-11: 07:00:00 via INTRAVENOUS
  Filled 2015-01-11: qty 1

## 2015-01-11 MED ORDER — CEFAZOLIN SODIUM 1-5 GM-% IV SOLN
INTRAVENOUS | Status: AC
  Filled 2015-01-11: qty 50

## 2015-01-11 MED ORDER — CEFAZOLIN SODIUM 1-5 GM-% IV SOLN
1.0000 g | Freq: Once | INTRAVENOUS | Status: AC
  Administered 2015-01-11: 1 g via INTRAVENOUS

## 2015-01-11 MED ORDER — LIDOCAINE-EPINEPHRINE (PF) 1 %-1:200000 IJ SOLN
INTRAMUSCULAR | Status: AC
  Filled 2015-01-11: qty 30

## 2015-01-11 MED ORDER — SODIUM BICARBONATE 8.4 % IV SOLN
INTRAVENOUS | Status: AC
  Administered 2015-01-11: 08:00:00 via INTRAVENOUS
  Filled 2015-01-11 (×2): qty 1000

## 2015-01-11 MED ORDER — SODIUM CHLORIDE 0.9 % IV SOLN
INTRAVENOUS | Status: DC
  Administered 2015-01-11 (×2): via INTRAVENOUS

## 2015-01-11 MED ORDER — HEPARIN SODIUM (PORCINE) 1000 UNIT/ML IJ SOLN
INTRAMUSCULAR | Status: DC | PRN
  Administered 2015-01-11: 4000 [IU] via INTRAVENOUS

## 2015-01-11 MED ORDER — CLOPIDOGREL BISULFATE 75 MG PO TABS
ORAL_TABLET | ORAL | Status: AC
  Filled 2015-01-11: qty 1

## 2015-01-11 SURGICAL SUPPLY — 19 items
BALLN LUTONIX 5X150X130 (BALLOONS) ×4
BALLN LUTONIX DCB 6X60X130 (BALLOONS) ×4
BALLN LUTONIX DCB 7X60X130 (BALLOONS) ×4
BALLN ULTRVRSE 7X60X75C (BALLOONS) ×4
BALLOON LUTONIX 5X150X130 (BALLOONS) ×2 IMPLANT
BALLOON LUTONIX DCB 6X60X130 (BALLOONS) ×2 IMPLANT
BALLOON LUTONIX DCB 7X60X130 (BALLOONS) ×2 IMPLANT
BALLOON ULTRVRSE 7X60X75C (BALLOONS) ×2 IMPLANT
CATH ROYAL FLUSH PIG 5F 70CM (CATHETERS) ×4 IMPLANT
DEVICE PRESTO INFLATION (MISCELLANEOUS) ×4 IMPLANT
DEVICE STARCLOSE SE CLOSURE (Vascular Products) ×4 IMPLANT
GLIDEWIRE ADV .035X260CM (WIRE) ×4 IMPLANT
PACK ANGIOGRAPHY (CUSTOM PROCEDURE TRAY) ×4 IMPLANT
SHEATH ANL2 6FRX45 HC (SHEATH) ×4 IMPLANT
SHEATH BRITE TIP 5FRX11 (SHEATH) ×4 IMPLANT
STENT LIFESTAR 7X60X80 (Permanent Stent) ×4 IMPLANT
SYR MEDRAD MARK V 150ML (SYRINGE) ×4 IMPLANT
TUBING CONTRAST HIGH PRESS 72 (TUBING) ×4 IMPLANT
WIRE J 3MM .035X145CM (WIRE) ×4 IMPLANT

## 2015-01-11 NOTE — Op Note (Signed)
[facility] Percutaneous Study/Intervention Procedural Note   Date of Surgery: 01/11/2015  Surgeon(s):Kavon Valenza   Assistants:none  Pre-operative Diagnosis: PAD with claudication bilateral lower extremities  Post-operative diagnosis: Same  Procedure(s) Performed: 1. Ultrasound guidance for vascular access right femoral artery 2. Catheter placement into left SFA from right femoral approach 3. Aortogram and selective left lower extremity angiogram 4. Percutaneous transluminal angioplasty of right external iliac artery with 6 mm diameter by 6 cm length Lutonix drug-coated angioplasty balloon 5. Percutaneous transluminal angioplasty of the left SFA 5 mm diameter by 15 cm length Lutonix drug-coated angioplasty balloon  6.  Percutaneous transluminal angioplasty of left common iliac artery with 7 mm diameter by 6 cm length Lutonix drug-coated angioplasty balloon  7.  Self-expanding stent placement to the right external iliac artery with 7 mm diameter by 6 cm length self-expanding stent for greater than 50% residual stenosis after angioplasty 8. StarClose closure device right femoral artery  EBL: 25 cc  Indications: Patient is a 64 year old female with lifestyle limiting claudication in both lower extremities. The patient has noninvasive study showing bilateral iliac artery stenosis and left SFA stenosis. Her ABI was more reduced on the left than the right. The patient is brought in for angiography for further evaluation and potential treatment. Risks and benefits are discussed and informed consent is obtained  Procedure: The patient was identified and appropriate procedural time out was performed. The patient was then placed supine on the table and prepped and draped in the usual sterile fashion. Ultrasound was used to evaluate the right common femoral artery. It was patent  . A digital ultrasound image was acquired. A Seldinger needle was used to access the right common femoral artery under direct ultrasound guidance and a permanent image was performed. A 0.035 J wire was advanced without resistance and a 5Fr sheath was placed. Pigtail catheter was placed into the aorta and an AP aortogram was performed. This demonstrated 2 right renal arteries which were patent 1 with a stent, no left renal artery visualized, and normal aorta without significant stenosis. Her right external iliac artery had about a 75% stenosis and her left common iliac artery had about a 70% stenosis. These were best seen on pelvic oblique views I then crossed the aortic bifurcation and advanced to the left femoral head and into the proximal SFA. Selective left lower extremity angiogram was then performed. This demonstrated several areas of narrowing within the proximal and mid SFA in the moderate range somewhere between 60 and 80% in each of these lesions. The patient was systemically heparinized and I initially treated the right external iliac artery with a 6 mm diameter by 6 cm length Lutonix drug-coated angioplasty balloon inflated to 10 atm for 1 minute. Completion angiogram showed greater than 50% residual stenosis and minimal improvement with angioplasty. I would later stent this but first a 6 [place] Ansell sheath was then placed over the [employer] wire. I then used the advantage wire to navigate into the distal SFA. The proximal to mid SFA was treated with a 5 mm diameter by 15 cm length Lutonix drug-coated angioplasty balloon inflated to 10 atm for 1 minute. Completion angiogram following this treatment revealed less than 20-25% residual stenosis that was not flow limiting. I then turned my attention to the left common iliac artery and treated this with a 7 mm diameter by 6 cm length Lutonix drug-coated angioplasty balloon. This was inflated to 12 atm for 1 minute. Completion angiogram showed  about a  30-35% residual stenosis and I elected not to stent this is a did not appear flow limiting. I then pulled the sheath back to the distal right external iliac artery and used a 7 mm diameter by 6 cm length life Star stent postdilated with a 7 mm balloon with excellent angiographic completion result and no significant residual stenosis. I elected to terminate the procedure. The sheath was removed and StarClose closure device was deployed in the left femoral artery with excellent hemostatic result. The patient was taken to the recovery room in stable condition having tolerated the procedure well.  Findings:  Aortogram: Right external iliac artery stenosis of about 75% improved with angioplasty and stenting, left common iliac artery stenosis of about 70% improved with angioplasty. Bilateral hypogastric arteries were diseased. Right renal arteries appeared patent. Aorta without stenosis Left Lower Extremity: Several areas in the proximal to mid left SFA with 60-80% stenosis improved with angioplasty, popliteal artery patent, two-vessel runoff distally   Disposition: Patient was taken to the recovery room in stable condition having tolerated the procedure well.  Complications: None  Shanele Nissan 01/11/2015 9:14 AM

## 2015-01-11 NOTE — Progress Notes (Signed)
Pt doing well post procedure, no bleeding nor hematoma at right groin site, family present, discharge teaching done with questions answered, return appt also given, finishing bicarb gtt orders, ready for discharge.

## 2015-01-11 NOTE — H&P (Signed)
  [facility] History & Physical Update  The patient was interviewed and re-examined.  The patient's previous History and Physical has been reviewed and is unchanged.  There is no change in the plan of care. We plan to proceed with the scheduled procedure.  Elara Cocke, MD  01/11/2015, 8:14 AM   

## 2015-01-12 ENCOUNTER — Ambulatory Visit (INDEPENDENT_AMBULATORY_CARE_PROVIDER_SITE_OTHER): Payer: 59 | Admitting: Internal Medicine

## 2015-01-12 ENCOUNTER — Encounter: Payer: Self-pay | Admitting: Vascular Surgery

## 2015-01-12 VITALS — BP 144/70 | HR 66 | Temp 98.7°F | Ht 64.0 in | Wt 192.0 lb

## 2015-01-12 DIAGNOSIS — I701 Atherosclerosis of renal artery: Secondary | ICD-10-CM | POA: Diagnosis not present

## 2015-01-12 DIAGNOSIS — N179 Acute kidney failure, unspecified: Secondary | ICD-10-CM | POA: Insufficient documentation

## 2015-01-12 DIAGNOSIS — I700 Atherosclerosis of aorta: Secondary | ICD-10-CM | POA: Insufficient documentation

## 2015-01-12 DIAGNOSIS — I708 Atherosclerosis of other arteries: Secondary | ICD-10-CM

## 2015-01-12 DIAGNOSIS — I70299 Other atherosclerosis of native arteries of extremities, unspecified extremity: Secondary | ICD-10-CM

## 2015-01-12 LAB — BASIC METABOLIC PANEL
BUN: 12 mg/dL (ref 6–23)
CHLORIDE: 100 meq/L (ref 96–112)
CO2: 29 meq/L (ref 19–32)
CREATININE: 0.95 mg/dL (ref 0.40–1.20)
Calcium: 10.2 mg/dL (ref 8.4–10.5)
GFR: 62.94 mL/min (ref >60.00)
GLUCOSE: 106 mg/dL — AB (ref 70–99)
Potassium: 4.9 mEq/L (ref 3.5–5.1)
Sodium: 137 mEq/L (ref 135–145)

## 2015-01-12 NOTE — Patient Instructions (Signed)
Labs today.  Follow up in 5 weeks.

## 2015-01-12 NOTE — Progress Notes (Signed)
Subjective:    Patient ID: Danielle Park, female    DOB: October 15, 1950, 64 y.o.   MRN: [id number]  HPI  64YO female presents for follow up.  Had angioplasty with stent placement in right iliac artery. Started on Plavix. Feeling well. Mild pain in legs, however much improved compared to prior to procedure. Able to walk outdoors today with no pain.  No other concerns today.  Wt Readings from Last 3 Encounters:  01/12/15 192 lb (87.091 kg)  01/11/15 197 lb (89.359 kg)  12/16/14 196 lb (88.905 kg)   BP Readings from Last 3 Encounters:  01/12/15 144/70  01/11/15 124/55  12/16/14 138/86    Past Medical History  Diagnosis Date  . Chicken pox   . Diabetes mellitus   . Hypertension   . UTI (urinary tract infection)   . Congenital single kidney   . 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 Hypertension Mother   . Diabetes Mother   . Heart disease Mother     s/p CABG x 2  . Hypertension Father   . Cancer Father     lung   . Heart disease Other   . Diabetes Other   . Diabetes Sister   . Heart disease Sister   . Heart disease Sister   . Cancer Sister     colon and ovary?   Past Surgical History  Procedure Laterality Date  . Cesarean section    . Vaginal delivery    . Renal artery stent  2014    Dr. Wyn Quaker at [facility]  . Peripheral vascular catheterization N/A 01/11/2015    Procedure: Abdominal Aortogram w/Lower Extremity;  Surgeon: Annice Needy, MD;  Location: [facility] INVASIVE CV LAB;  Service: Cardiovascular;  Laterality: N/A;  . Peripheral vascular catheterization  01/11/2015    Procedure: Lower Extremity Intervention;  Surgeon: Annice Needy, MD;  Location: [facility] INVASIVE CV LAB;  Service: Cardiovascular;;   Social History   Social History  . Marital Status: Married    Spouse Name: N/A  . Number of Children: N/A  . Years of Education: N/A   Social History Main Topics  . Smoking  status: Never Smoker   . Smokeless tobacco: None  . Alcohol Use: No  . Drug Use: None  . Sexual Activity: Not Asked   Other Topics Concern  . None   Social History Narrative   Lives in [place] with husband. 1 dog      Work - Administrator, [employer]   Diet - regular diet   Exercise - occasional, very active at work    Review of Systems  Constitutional: Negative for fever, chills, appetite change, fatigue and unexpected weight change.  Eyes: Negative for visual disturbance.  Respiratory: Negative for shortness of breath.   Cardiovascular: Negative for chest pain, palpitations and leg swelling.  Gastrointestinal: Negative for nausea, abdominal pain, diarrhea and constipation.  Musculoskeletal: Positive for myalgias and arthralgias.  Skin: Negative for color change and rash.  Hematological: Negative for adenopathy. Does not bruise/bleed easily.  Psychiatric/Behavioral: Negative for dysphoric mood. The patient is not nervous/anxious.        Objective:    BP 144/70 mmHg  Pulse 66  Temp(Src) 98.7 F (37.1 C) (Oral)  Ht  (1.626 m)  Wt 192 lb (87.091 kg)  BMI 32.94 kg/m2  SpO2 97% Physical Exam  Constitutional: She is oriented  to person, place, and time. She appears well-developed and well-nourished. No distress.  HENT:  Head: Normocephalic and atraumatic.  Right Ear: External ear normal.  Left Ear: External ear normal.  Nose: Nose normal.  Mouth/Throat: Oropharynx is clear and moist. No oropharyngeal exudate.  Eyes: Conjunctivae are normal. Pupils are equal, round, and reactive to light. Right eye exhibits no discharge. Left eye exhibits no discharge. No scleral icterus.  Neck: Normal range of motion. Neck supple. No tracheal deviation present. No thyromegaly present.  Cardiovascular: Normal rate, regular rhythm, normal heart sounds and intact distal pulses.  Exam reveals no gallop and no friction rub.   No murmur heard. Pulmonary/Chest: Effort normal and breath sounds normal. No  respiratory distress. She has no wheezes. She has no rales. She exhibits no tenderness.  Musculoskeletal: Normal range of motion. She exhibits no edema or tenderness.  Lymphadenopathy:    She has no cervical adenopathy.  Neurological: She is alert and oriented to person, place, and time. No cranial nerve deficit. She exhibits normal muscle tone. Coordination normal.  Skin: Skin is warm and dry. No rash noted. She is not diaphoretic. No erythema. No pallor.  Psychiatric: She has a normal mood and affect. Her behavior is normal. Judgment and thought content normal.          Assessment & Plan:   Problem List Items Addressed This Visit      Unprioritized   Acute renal injury (HCC) - Primary    Recent labs showed decline in renal function. Will repeat today.      Relevant Orders   Basic Metabolic Panel (BMET)   Aorto-iliac atherosclerosis (HCC)    Reviewed notes from recent angioplasty and stent placement. Reviewed importance of taking Plavix. Follow up with Dr. Wyn Quakerew as scheduled.      Renal artery stenosis (HCC)       Return in about 5 weeks (around 02/16/2015) for Recheck of Diabetes.

## 2015-01-12 NOTE — Progress Notes (Signed)
Pre visit review using our clinic review tool, if applicable. No additional management support is needed unless otherwise documented below in the visit note. 

## 2015-01-12 NOTE — Assessment & Plan Note (Signed)
Recent labs showed decline in renal function. Will repeat today.

## 2015-01-12 NOTE — Assessment & Plan Note (Signed)
Reviewed notes from recent angioplasty and stent placement. Reviewed importance of taking Plavix. Follow up with Dr. Wyn Quakerew as scheduled.

## 2015-01-13 ENCOUNTER — Ambulatory Visit: Payer: 59 | Admitting: Family Medicine

## 2015-02-03 ENCOUNTER — Other Ambulatory Visit: Payer: Self-pay | Admitting: Internal Medicine

## 2015-02-24 ENCOUNTER — Encounter: Payer: Self-pay | Admitting: Internal Medicine

## 2015-02-24 ENCOUNTER — Ambulatory Visit (INDEPENDENT_AMBULATORY_CARE_PROVIDER_SITE_OTHER): Payer: 59 | Admitting: Internal Medicine

## 2015-02-24 VITALS — BP 157/76 | HR 57 | Temp 97.9°F | Ht 64.0 in | Wt 196.8 lb

## 2015-02-24 DIAGNOSIS — E1165 Type 2 diabetes mellitus with hyperglycemia: Secondary | ICD-10-CM | POA: Diagnosis not present

## 2015-02-24 DIAGNOSIS — N183 Chronic kidney disease, stage 3 (moderate): Secondary | ICD-10-CM

## 2015-02-24 DIAGNOSIS — I10 Essential (primary) hypertension: Secondary | ICD-10-CM

## 2015-02-24 DIAGNOSIS — Z794 Long term (current) use of insulin: Secondary | ICD-10-CM | POA: Diagnosis not present

## 2015-02-24 DIAGNOSIS — E1122 Type 2 diabetes mellitus with diabetic chronic kidney disease: Secondary | ICD-10-CM

## 2015-02-24 DIAGNOSIS — I701 Atherosclerosis of renal artery: Secondary | ICD-10-CM

## 2015-02-24 DIAGNOSIS — IMO0002 Reserved for concepts with insufficient information to code with codable children: Secondary | ICD-10-CM

## 2015-02-24 LAB — LIPID PANEL
CHOL/HDL RATIO: 4
Cholesterol: 175 mg/dL (ref 0–200)
HDL: 41.8 mg/dL (ref >39.00)
LDL Cholesterol: 101 mg/dL — ABNORMAL HIGH (ref 0–99)
NONHDL: 133.03
Triglycerides: 158 mg/dL — ABNORMAL HIGH (ref 0.0–149.0)
VLDL: 31.6 mg/dL (ref 0.0–40.0)

## 2015-02-24 LAB — COMPREHENSIVE METABOLIC PANEL
ALT: 10 U/L (ref 0–35)
AST: 18 U/L (ref 0–37)
Albumin: 3.8 g/dL (ref 3.5–5.2)
Alkaline Phosphatase: 127 U/L — ABNORMAL HIGH (ref 39–117)
BUN: 16 mg/dL (ref 6–23)
CHLORIDE: 104 meq/L (ref 96–112)
CO2: 28 mEq/L (ref 19–32)
CREATININE: 0.81 mg/dL (ref 0.40–1.20)
Calcium: 9.1 mg/dL (ref 8.4–10.5)
GFR: 75.63 mL/min (ref >60.00)
GLUCOSE: 175 mg/dL — AB (ref 70–99)
POTASSIUM: 4.1 meq/L (ref 3.5–5.1)
SODIUM: 137 meq/L (ref 135–145)
TOTAL PROTEIN: 7.7 g/dL (ref 6.0–8.3)
Total Bilirubin: 0.4 mg/dL (ref 0.2–1.2)

## 2015-02-24 LAB — HEMOGLOBIN A1C: Hgb A1c MFr Bld: 7.7 % — ABNORMAL HIGH (ref 4.6–6.5)

## 2015-02-24 MED ORDER — INSULIN GLARGINE 100 UNIT/ML SOLOSTAR PEN: 35.0000 [IU] | PEN_INJECTOR | Freq: Every day | SUBCUTANEOUS | Status: DC

## 2015-02-24 NOTE — Patient Instructions (Signed)
Labs today.  Follow up 3 months. 

## 2015-02-24 NOTE — Progress Notes (Signed)
Pre visit review using our clinic review tool, if applicable. No additional management support is needed unless otherwise documented below in the visit note. 

## 2015-02-24 NOTE — Assessment & Plan Note (Signed)
>>  ASSESSMENT AND PLAN FOR HYPERTENSION ASSOCIATED WITH DIABETES (HCC) WRITTEN ON 02/24/2015  7:36 AM BY VANNIE, JENNIFER A, MD  BP Readings from Last 3 Encounters:  02/24/15 157/76  01/12/15 144/70  01/11/15 124/55   BP generally well controlled. Continue current medication.

## 2015-02-24 NOTE — Assessment & Plan Note (Signed)
BG improved. A1c with labs. Continue Lantus and Glipizide.

## 2015-02-24 NOTE — Assessment & Plan Note (Signed)
Again reviewed importance of Plavix. Follow up with Dr. Wyn Quakerew as scheduled.

## 2015-02-24 NOTE — Assessment & Plan Note (Signed)
BP Readings from Last 3 Encounters:  02/24/15 157/76  01/12/15 144/70  01/11/15 124/55   BP generally well controlled. Continue current medication.

## 2015-02-24 NOTE — Progress Notes (Signed)
Subjective:    Patient ID: Danielle Park, female    DOB: 01/26/1951, 64 y.o.   MRN: [id number]  HPI  64YO female presents for follow up.  DM - BG well controlled. Mostly 110-170s. Compliant with medication.  HTN - BP well controlled. No CP, HA.  Wt Readings from Last 3 Encounters:  02/24/15 196 lb 12 oz (89.245 kg)  01/12/15 192 lb (87.091 kg)  01/11/15 197 lb (89.359 kg)   BP Readings from Last 3 Encounters:  02/24/15 157/76  01/12/15 144/70  01/11/15 124/55    Past Medical History  Diagnosis Date  . Chicken pox   . Diabetes mellitus   . Hypertension   . UTI (urinary tract infection)   . Congenital single kidney   . 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 Hypertension Mother   . Diabetes Mother   . Heart disease Mother     s/p CABG x 2  . Hypertension Father   . Cancer Father     lung   . Heart disease Other   . Diabetes Other   . Diabetes Sister   . Heart disease Sister   . Heart disease Sister   . Cancer Sister     colon and ovary?   Past Surgical History  Procedure Laterality Date  . Cesarean section    . Vaginal delivery    . Renal artery stent  2014    Dr. Wyn Quakerew at [facility]  . Peripheral vascular catheterization N/A 01/11/2015    Procedure: Abdominal Aortogram w/Lower Extremity;  Surgeon: Annice NeedyJason S Dew, MD;  Location: [facility] INV[facility];  Service: Cardiovascular;  Laterality: N/A;  . Peripheral vascular catheterization  01/11/2015    Procedure: Lower Extremity Intervention;  Surgeon: Annice NeedyJason S Dew, MD;  Location: [facility] INV[facility];  Service: Cardiovascular;;   Social History   Social History  . Marital Status: Married    Spouse Name: N/A  . Number of Children: N/A  . Years of Education: N/A   Social History Main Topics  . Smoking status: Never Smoker   . Smokeless tobacco: None  . Alcohol Use: No  . Drug Use: None  . Sexual Activity: Not Asked    Other Topics Concern  . None   Social History Narrative   [place]. 1 dog      Work - Administrator, [employer]   Diet - regular diet   Exercise - occasional, very active at work    Review of Systems  Constitutional: Negative for fever, chills, appetite change, fatigue and unexpected weight change.  Eyes: Negative for visual disturbance.  Respiratory: Negative for shortness of breath.   Cardiovascular: Negative for chest pain and leg swelling.  Gastrointestinal: Negative for vomiting, abdominal pain, diarrhea and constipation.  Musculoskeletal: Negative for myalgias and arthralgias.  Skin: Negative for color change and rash.  Hematological: Negative for adenopathy. Does not bruise/bleed easily.  Psychiatric/Behavioral: Negative for dysphoric mood. The patient is not nervous/anxious.        Objective:    BP 157/76 mmHg  Pulse 57  Temp(Src) 97.9 F (36.6 C) (Oral)  Ht 5\' 4"  (1.626 m)  Wt 196 lb 12 oz (89.245 kg)  BMI 33.76 kg/m2  SpO2 98% Physical Exam  Constitutional: She is oriented to person, place, and time. She appears well-developed and well-nourished. No distress.  HENT:  Head: Normocephalic  and atraumatic.  Right Ear: External ear normal.  Left Ear: External ear normal.  Nose: Nose normal.  Mouth/Throat: Oropharynx is clear and moist. No oropharyngeal exudate.  Eyes: Conjunctivae are normal. Pupils are equal, round, and reactive to light. Right eye exhibits no discharge. Left eye exhibits no discharge. No scleral icterus.  Neck: Normal range of motion. Neck supple. No tracheal deviation present. No thyromegaly present.  Cardiovascular: Normal rate, regular rhythm, normal heart sounds and intact distal pulses.  Exam reveals no gallop and no friction rub.   No murmur heard. Pulmonary/Chest: Effort normal and breath sounds normal. No respiratory distress. She has no wheezes. She has no rales. She exhibits no tenderness.  Musculoskeletal: Normal range of  motion. She exhibits no edema or tenderness.  Lymphadenopathy:    She has no cervical adenopathy.  Neurological: She is alert and oriented to person, place, and time. No cranial nerve deficit. She exhibits normal muscle tone. Coordination normal.  Skin: Skin is warm and dry. No rash noted. She is not diaphoretic. No erythema. No pallor.  Psychiatric: She has a normal mood and affect. Her behavior is normal. Judgment and thought content normal.          Assessment & Plan:   Problem List Items Addressed This Visit      High   Diabetes mellitus type 2, uncontrolled (HCC) - Primary    BG improved. A1c with labs. Continue Lantus and Glipizide.      Relevant Medications   Insulin Glargine (LANTUS SOLOSTAR) 100 UNIT/ML Solostar Pen   Other Relevant Orders   Comprehensive metabolic panel   Hemoglobin A1c   Lipid panel   Hypertension    BP Readings from Last 3 Encounters:  02/24/15 157/76  01/12/15 144/70  01/11/15 124/55   BP generally well controlled. Continue current medication.        Unprioritized   Renal artery stenosis (HCC)    Again reviewed importance of Plavix. Follow up with Dr. Wyn Quaker as scheduled.          Return in about 3 months (around 05/25/2015).

## 2015-02-24 NOTE — Assessment & Plan Note (Signed)
>>  ASSESSMENT AND PLAN FOR CONTROLLED DIABETES MELLITUS TYPE 2 WITH COMPLICATIONS (HCC) WRITTEN ON 02/24/2015  7:36 AM BY WALKER, JENNIFER A, MD  BG improved. A1c with labs. Continue Lantus  and Glipizide .

## 2015-03-06 ENCOUNTER — Emergency Department
Admission: EM | Admit: 2015-03-06 | Discharge: 2015-03-06 | Disposition: A | Discharge status: Home / Self Care | Payer: 59 | Attending: Emergency Medicine | Admitting: Emergency Medicine

## 2015-03-06 ENCOUNTER — Encounter: Payer: Self-pay | Admitting: Emergency Medicine

## 2015-03-06 DIAGNOSIS — I10 Essential (primary) hypertension: Secondary | ICD-10-CM | POA: Diagnosis not present

## 2015-03-06 DIAGNOSIS — R112 Nausea with vomiting, unspecified: Secondary | ICD-10-CM | POA: Diagnosis not present

## 2015-03-06 DIAGNOSIS — Z794 Long term (current) use of insulin: Secondary | ICD-10-CM | POA: Insufficient documentation

## 2015-03-06 DIAGNOSIS — R42 Dizziness and giddiness: Secondary | ICD-10-CM | POA: Diagnosis present

## 2015-03-06 DIAGNOSIS — Z7982 Long term (current) use of aspirin: Secondary | ICD-10-CM | POA: Insufficient documentation

## 2015-03-06 DIAGNOSIS — Z79899 Other long term (current) drug therapy: Secondary | ICD-10-CM | POA: Diagnosis not present

## 2015-03-06 DIAGNOSIS — E119 Type 2 diabetes mellitus without complications: Secondary | ICD-10-CM | POA: Insufficient documentation

## 2015-03-06 LAB — CBC
HCT: 40.6 % (ref 35.0–47.0)
HEMOGLOBIN: 13.3 g/dL (ref 12.0–16.0)
MCH: 28.3 pg (ref 26.0–34.0)
MCHC: 32.6 g/dL (ref 32.0–36.0)
MCV: 86.7 fL (ref 80.0–100.0)
Platelets: 205 10*3/uL (ref 150–440)
RBC: 4.68 MIL/uL (ref 3.80–5.20)
RDW: 13.3 % (ref 11.5–14.5)
WBC: 14.5 10*3/uL — AB (ref 3.6–11.0)

## 2015-03-06 LAB — COMPREHENSIVE METABOLIC PANEL
ALBUMIN: 3.8 g/dL (ref 3.5–5.0)
ALT: 10 U/L — ABNORMAL LOW (ref 14–54)
ANION GAP: 7 (ref 5–15)
AST: 16 U/L (ref 15–41)
Alkaline Phosphatase: 113 U/L (ref 38–126)
BILIRUBIN TOTAL: 0.3 mg/dL (ref 0.3–1.2)
BUN: 11 mg/dL (ref 6–20)
CALCIUM: 9.1 mg/dL (ref 8.9–10.3)
CO2: 24 mmol/L (ref 22–32)
Chloride: 105 mmol/L (ref 101–111)
Creatinine, Ser: 0.81 mg/dL (ref 0.44–1.00)
GLUCOSE: 265 mg/dL — AB (ref 65–99)
POTASSIUM: 3.7 mmol/L (ref 3.5–5.1)
Sodium: 136 mmol/L (ref 135–145)
TOTAL PROTEIN: 8.2 g/dL — AB (ref 6.5–8.1)

## 2015-03-06 LAB — URINALYSIS COMPLETE WITH MICROSCOPIC (ARMC ONLY)
BILIRUBIN URINE: NEGATIVE
GLUCOSE, UA: 50 mg/dL — AB
Ketones, ur: NEGATIVE mg/dL
LEUKOCYTES UA: NEGATIVE
NITRITE: NEGATIVE
Protein, ur: NEGATIVE mg/dL
SPECIFIC GRAVITY, URINE: 1.014 (ref 1.005–1.030)
pH: 5 (ref 5.0–8.0)

## 2015-03-06 LAB — LIPASE, BLOOD: Lipase: 42 U/L (ref 11–51)

## 2015-03-06 MED ORDER — MECLIZINE HCL 25 MG PO TABS
25.0000 mg | ORAL_TABLET | Freq: Once | ORAL | Status: AC
  Administered 2015-03-06: 25 mg via ORAL
  Filled 2015-03-06: qty 1

## 2015-03-06 MED ORDER — MECLIZINE HCL 25 MG PO TABS: 25.0000 mg | ORAL_TABLET | Freq: Three times a day (TID) | ORAL | Status: DC | PRN

## 2015-03-06 MED ORDER — SODIUM CHLORIDE 0.9 % IV BOLUS (SEPSIS)
1000.0000 mL | Freq: Once | INTRAVENOUS | Status: AC
  Administered 2015-03-06: 1000 mL via INTRAVENOUS

## 2015-03-06 NOTE — ED Provider Notes (Signed)
[facility]____________  Time seen: 1205  I have reviewed the triage vital signs and the nursing notes.   HISTORY  Chief Complaint Dizziness  History limited by: Not Limited   HPI Danielle Park is a 64 y.o. female who presents to the emergency department today because of concerns for dizziness. The patient states that these symptoms were present when she woke up this morning that was about 5 hours ago. She states it gradually improved. She describes it as a sense of the room spinning around her. She states she was having a hard time walking. She did have some nausea and vomiting with this. She denied any abdominal pain. She denied any associated chest pain shortness breath or palpitations. She states she had similar symptoms many years in the past when she had a bout of vertigo. Patient denies any recent illnesses, fevers, colds.     Past Medical History  Diagnosis Date  . Chicken pox   . Diabetes mellitus   . Hypertension   . UTI (urinary tract infection)   . Congenital single kidney   . 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 Acute renal injury (HCC) 01/12/2015  . Aorto-iliac atherosclerosis (HCC) 01/12/2015  . Right hip pain 08/19/2014  . Obesity (BMI 30-39.9) 05/21/2014  . Pain in joint, pelvic region and thigh 05/21/2014  . Hypothyroidism 07/26/2012  . Renal artery stenosis (HCC) 05/22/2012  . Diabetes mellitus type 2, uncontrolled (HCC) 09/13/2011  . Hypertension 09/13/2011  . Screening for colon cancer 09/13/2011  . Congenital single kidney 09/13/2011  . Screening for breast cancer 09/13/2011  . Screening for cervical cancer 09/13/2011    Past Surgical History  Procedure Laterality Date  . Cesarean section    . Vaginal delivery    . Renal artery stent  2014    Dr. Wyn Quaker at [facility]  . Peripheral vascular catheterization N/A 01/11/2015    Procedure: Abdominal Aortogram w/Lower Extremity;  Surgeon: Annice Needy, MD;  Location: [facility] INV[facility]IVE CV [facility];  Service: Cardiovascular;  Laterality: N/A;  . Peripheral vascular catheterization  01/11/2015    Procedure: Lower Extremity Intervention;  Surgeon: Annice Needy, MD;  Location: [facility] INV[facility];  Service: Cardiovascular;;    Current Outpatient Rx  Name  Route  Sig  Dispense  Refill  . amLODipine (NORVASC) 10 MG tablet      TAKE ONE TABLET BY MOUTH ONCE DAILY   30 tablet   2   . aspirin 81 MG tablet   Oral   Take 81 mg by mouth daily.         . clopidogrel (PLAVIX) 75 MG tablet   Oral   Take 75 mg by mouth daily.         . furosemide (LASIX) 20 MG tablet   Oral   Take 20 mg by mouth daily.         Marland Kitchen gabapentin (NEURONTIN) 100 MG capsule   Oral   Take 1 capsule (100 mg total) by mouth at bedtime.   30 capsule   3   . glipiZIDE (GLUCOTROL XL) 10 MG 24 hr tablet      TAKE ONE TABLET BY MOUTH ONCE DAILY   90 tablet   3   . hydrALAZINE (APRESOLINE) 25 MG tablet   Oral   Take  25 mg by mouth 2 (two) times daily as needed.         . Insulin Glargine (LANTUS SOLOSTAR) 100 UNIT/ML Solostar Pen   Subcutaneous   Inject 35 Units into the skin daily at 10 pm.   5 pen   11   . levothyroxine (SYNTHROID, LEVOTHROID) 75 MCG tablet      TAKE ONE TABLET BY MOUTH ONCE DAILY BEFORE BREAKFAST   90 tablet   3   . lisinopril (PRINIVIL,ZESTRIL) 40 MG tablet   Oral   Take 1 tablet (40 mg total) by mouth daily.   90 tablet   3   . metoprolol tartrate (LOPRESSOR) 25 MG tablet   Oral   Take 25 mg by mouth 2 (two) times daily.         Marland Kitchen NOVOFINE 32G X 6 MM MISC      USE AS DIRECTED WITH  LEVEMIR  PEN   100 each   3     Allergies Review of patient's allergies indicates no known allergies.  Family History  Problem Relation Age of Onset  . Hypertension Mother   . Diabetes Mother    . Heart disease Mother     s/p CABG x 2  . Hypertension Father   . Cancer Father     lung   . Heart disease Other   . Diabetes Other   . Diabetes Sister   . Heart disease Sister   . Heart disease Sister   . Cancer Sister     colon and ovary?    Social History Social History  Substance Use Topics  . Smoking status: Never Smoker   . Smokeless tobacco: None  . Alcohol Use: No    Review of Systems  Constitutional: Negative for fever. Cardiovascular: Negative for chest pain. Respiratory: Negative for shortness of breath. Gastrointestinal: Negative for abdominal pain, vomiting and diarrhea. Neurological: Negative for headaches, focal weakness or numbness. Positive for dizziness.  10-point ROS otherwise negative.  ____________________________________________   PHYSICAL EXAM:  VITAL SIGNS: ED Triage Vitals  Enc Vitals Group     BP --      Pulse --      Resp --      Temp --      Temp src --      SpO2 03/06/15 1142 97 %     Weight 03/06/15 1147 196 lb (88.905 kg)     Height 03/06/15 1147  (1.6 m)   Constitutional: Alert and oriented. Well appearing and in no distress. Eyes: Conjunctivae are normal. PERRL. Normal extraocular movements. ENT   Head: Normocephalic and atraumatic.   Nose: No congestion/rhinnorhea.   Mouth/Throat: Mucous membranes are moist.   Neck: No stridor. Hematological/Lymphatic/Immunilogical: No cervical lymphadenopathy. Cardiovascular: Normal rate, regular rhythm.  No murmurs, rubs, or gallops. Respiratory: Normal respiratory effort without tachypnea nor retractions. Breath sounds are clear and equal bilaterally. No wheezes/rales/rhonchi. Gastrointestinal: Soft and nontender. No distention. There is no CVA tenderness. Genitourinary: Deferred Musculoskeletal: Normal range of motion in all extremities. No joint effusions.  No lower extremity tenderness nor edema. Neurologic:  Normal speech and language. Face symmetric. Tongue  midline. Symmetric palatal elevation. Strength 5 out of 5 in upper and lower extremities. Sensation grossly intact. No drift. Finger to nose  normal. No nystagmus. No gross focal neurologic deficits are appreciated.  Skin:  Skin is warm, dry and intact. No rash noted. Psychiatric: Mood and affect are normal. Speech and behavior are normal. Patient exhibits appropriate insight  and judgment.  ____________________________________________    LABS (pertinent positives/negatives)  Labs Reviewed  COMPREHENSIVE METABOLIC PANEL - Abnormal; Notable for the following:    Glucose, Bld 265 (*)    Total Protein 8.2 (*)    ALT 10 (*)    All other components within normal limits  CBC - Abnormal; Notable for the following:    WBC 14.5 (*)    All other components within normal limits  URINALYSIS COMPLETEWITH MICROSCOPIC ([facility] ONLY) - Abnormal; Notable for the following:    Color, Urine YELLOW (*)    APPearance CLEAR (*)    Glucose, UA 50 (*)    Hgb urine dipstick 1+ (*)    Bacteria, UA RARE (*)    Squamous Epithelial / LPF 0-5 (*)    All other components within normal limits  LIPASE, BLOOD     ____________________________________________   EKG  I, Phineas SemenGraydon Reo Portela, attending physician, personally viewed and interpreted this EKG  EKG Time: 1146 Rate: 66 Rhythm: NSR Axis: normal Intervals: qtc 446 QRS: narrow ST changes: no st elevation Impression: normal ekg ____________________________________________    RADIOLOGY  None   ____________________________________________   PROCEDURES  Procedure(s) performed: None  Critical Care performed: No  ____________________________________________   INITIAL IMPRESSION / ASSESSMENT AND PLAN / ED COURSE  Pertinent labs & imaging results that were available during my care of the patient were reviewed by me and considered in my medical decision making (see chart for details).  Patient presented to the emergency department today  because of concerns for dizziness. No focal neuro deficits on exam. The patient stated she did feel better after IV fluids and Antivert. Will plan on discharging home with Antivert.  ____________________________________________   FINAL CLINICAL IMPRESSION(S) / ED DIAGNOSES  Final diagnoses:  Dizziness  Vertigo     Phineas SemenGraydon Orel Cooler, MD 03/06/15 [phone]

## 2015-03-06 NOTE — ED Notes (Signed)
Pt verbalized understanding of discharge instructions. NAD at this time. 

## 2015-03-06 NOTE — Discharge Instructions (Signed)
Please seek medical attention for any high fevers, chest pain, shortness of breath, change in behavior, persistent vomiting, bloody stool or any other new or concerning symptoms. ° ° °Vertigo °Vertigo means you feel like you or your surroundings are moving when they are not. Vertigo can be dangerous if it occurs when you are at work, driving, or performing difficult activities.  °CAUSES  °Vertigo occurs when there is a conflict of signals sent to your brain from the visual and sensory systems in your body. There are many different causes of vertigo, including: °· Infections, especially in the inner ear. °· A bad reaction to a drug or misuse of alcohol and medicines. °· Withdrawal from drugs or alcohol. °· Rapidly changing positions, such as lying down or rolling over in bed. °· A migraine headache. °· Decreased blood flow to the brain. °· Increased pressure in the brain from a head injury, infection, tumor, or bleeding. °SYMPTOMS  °You may feel as though the world is spinning around or you are falling to the ground. Because your balance is upset, vertigo can cause nausea and vomiting. You may have involuntary eye movements (nystagmus). °DIAGNOSIS  °Vertigo is usually diagnosed by physical exam. If the cause of your vertigo is unknown, your caregiver may perform imaging tests, such as an MRI scan (magnetic resonance imaging). °TREATMENT  °Most cases of vertigo resolve on their own, without treatment. Depending on the cause, your caregiver may prescribe certain medicines. If your vertigo is related to body position issues, your caregiver may recommend movements or procedures to correct the problem. In rare cases, if your vertigo is caused by certain inner ear problems, you may need surgery. °HOME CARE INSTRUCTIONS  °· Follow your caregiver's instructions. °· Avoid driving. °· Avoid operating heavy machinery. °· Avoid performing any tasks that would be dangerous to you or others during a vertigo episode. °· Tell your  caregiver if you notice that certain medicines seem to be causing your vertigo. Some of the medicines used to treat vertigo episodes can actually make them worse in some people. °SEEK IMMEDIATE MEDICAL CARE IF:  °· Your medicines do not relieve your vertigo or are making it worse. °· You develop problems with talking, walking, weakness, or using your arms, hands, or legs. °· You develop severe headaches. °· Your nausea or vomiting continues or gets worse. °· You develop visual changes. °· A family member notices behavioral changes. °· Your condition gets worse. °MAKE SURE YOU: °· Understand these instructions. °· Will watch your condition. °· Will get help right away if you are not doing well or get worse. °  °This information is not intended to replace advice given to you by your health care provider. Make sure you discuss any questions you have with your health care provider. °  °Document Released: 12/21/2004 Document Revised: 06/05/2011 Document Reviewed: 07/06/2014 °Elsevier Interactive Patient Education ©2016 [employer] ° °

## 2015-03-09 ENCOUNTER — Encounter: Payer: Self-pay | Admitting: Internal Medicine

## 2015-05-14 ENCOUNTER — Other Ambulatory Visit: Payer: Self-pay | Admitting: Internal Medicine

## 2015-06-03 ENCOUNTER — Ambulatory Visit: Payer: 59 | Admitting: Internal Medicine

## 2015-06-03 ENCOUNTER — Encounter: Payer: Self-pay | Admitting: Vascular Surgery

## 2015-06-11 ENCOUNTER — Ambulatory Visit: Payer: Self-pay | Admitting: Internal Medicine

## 2015-06-18 ENCOUNTER — Ambulatory Visit (INDEPENDENT_AMBULATORY_CARE_PROVIDER_SITE_OTHER): Payer: BLUE CROSS/BLUE SHIELD | Admitting: Internal Medicine

## 2015-06-18 ENCOUNTER — Encounter: Payer: Self-pay | Admitting: Internal Medicine

## 2015-06-18 VITALS — BP 135/62 | HR 58 | Temp 98.0°F | Ht 63.0 in | Wt 198.4 lb

## 2015-06-18 DIAGNOSIS — E039 Hypothyroidism, unspecified: Secondary | ICD-10-CM

## 2015-06-18 DIAGNOSIS — I10 Essential (primary) hypertension: Secondary | ICD-10-CM

## 2015-06-18 DIAGNOSIS — N183 Chronic kidney disease, stage 3 (moderate): Secondary | ICD-10-CM

## 2015-06-18 DIAGNOSIS — E1122 Type 2 diabetes mellitus with diabetic chronic kidney disease: Secondary | ICD-10-CM | POA: Diagnosis not present

## 2015-06-18 DIAGNOSIS — E1165 Type 2 diabetes mellitus with hyperglycemia: Secondary | ICD-10-CM

## 2015-06-18 DIAGNOSIS — Z794 Long term (current) use of insulin: Secondary | ICD-10-CM

## 2015-06-18 DIAGNOSIS — IMO0002 Reserved for concepts with insufficient information to code with codable children: Secondary | ICD-10-CM

## 2015-06-18 LAB — TSH: TSH: 4.61 u[IU]/mL — ABNORMAL HIGH (ref 0.35–4.50)

## 2015-06-18 LAB — COMPREHENSIVE METABOLIC PANEL
ALT: 7 U/L (ref 0–35)
AST: 13 U/L (ref 0–37)
Albumin: 3.9 g/dL (ref 3.5–5.2)
Alkaline Phosphatase: 137 U/L — ABNORMAL HIGH (ref 39–117)
BUN: 15 mg/dL (ref 6–23)
CHLORIDE: 103 meq/L (ref 96–112)
CO2: 28 meq/L (ref 19–32)
Calcium: 9.3 mg/dL (ref 8.4–10.5)
Creatinine, Ser: 0.86 mg/dL (ref 0.40–1.20)
GFR: 70.51 mL/min (ref >60.00)
GLUCOSE: 158 mg/dL — AB (ref 70–99)
POTASSIUM: 4.3 meq/L (ref 3.5–5.1)
SODIUM: 137 meq/L (ref 135–145)
Total Bilirubin: 0.4 mg/dL (ref 0.2–1.2)
Total Protein: 7.6 g/dL (ref 6.0–8.3)

## 2015-06-18 LAB — HEMOGLOBIN A1C: Hgb A1c MFr Bld: 8.4 % — ABNORMAL HIGH (ref 4.6–6.5)

## 2015-06-18 NOTE — Assessment & Plan Note (Signed)
BG well controlled. A1c with labs. Continue current medication. 

## 2015-06-18 NOTE — Assessment & Plan Note (Signed)
>>  ASSESSMENT AND PLAN FOR CONTROLLED DIABETES MELLITUS TYPE 2 WITH COMPLICATIONS (HCC) WRITTEN ON 06/18/2015  7:42 AM BY WALKER, JENNIFER A, MD  BG well controlled. A1c with labs. Continue current medication.

## 2015-06-18 NOTE — Patient Instructions (Signed)
Labs today.  Continue current medication

## 2015-06-18 NOTE — Assessment & Plan Note (Signed)
BP Readings from Last 3 Encounters:  06/18/15 135/62  03/06/15 153/62  02/24/15 157/76   BP well controlled. Renal function with labs today.Continue current medication.

## 2015-06-18 NOTE — Progress Notes (Signed)
Subjective:    Patient ID: Danielle SaugerBobbie Park, female    DOB: [birth year], 65 y.o.   MRN: [id number]  HPI  65YO female presents for follow up.  DM - BG near 150 or less in the morning. Compliant with medication.  Generally feeling well.  HTN - BP has been well controlled. Did not bring record with her today. No CP, HA.   Wt Readings from Last 3 Encounters:  06/18/15 198 lb 6 oz (89.982 kg)  03/06/15 196 lb (88.905 kg)  02/24/15 196 lb 12 oz (89.245 kg)   BP Readings from Last 3 Encounters:  06/18/15 135/62  03/06/15 153/62  02/24/15 157/76    Past Medical History  Diagnosis Date  . Chicken pox   . Diabetes mellitus   . Hypertension   . UTI (urinary tract infection)   . Congenital single kidney   . 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 Hypertension Mother   . Diabetes Mother   . Heart disease Mother     s/p CABG x 2  . Hypertension Father   . Cancer Father     lung   . Heart disease Other   . Diabetes Other   . Diabetes Sister   . Heart disease Sister   . Heart disease Sister   . Cancer Sister     colon and ovary?   Past Surgical History  Procedure Laterality Date  . Cesarean section    . Vaginal delivery    . Renal artery stent  2014    [facility]. Wyn Qu[facility]rew at [facility]  . Peripheral vascular catheterization N/A 01/11/2015    Procedure: Abdominal Aortogram w/Lower Extremity;  Surgeon: Annice NeedyJason S Dew, MD;  Location: [facility] INVASIVE CV LAB;  Service: Cardiovascular;  Laterality: N/A;  . Peripheral vascular catheterization  01/11/2015    Procedure: Lower Extremity Intervention;  Surgeon: Annice NeedyJason S Dew, MD;  Location: [facility] INVASIVE CV LAB;  Service: Cardiovascular;;   Social History   Social History  . Marital Status: Married    Spouse Name: N/A  . Number of Children: N/A  . Years of Education: N/A   Social History Main Topics  . Smoking status: Never Smoker   . Smokeless tobacco:  None  . Alcohol Use: No  . Drug Use: None  . Sexual Activity: Not Asked   Other Topics Concern  . None   Social History Narrative   Lives in [place] with husband. 1 dog      Work - Administrator, [employer]   Diet - regular diet   Exercise - occasional, very active at work    Review of Systems  Constitutional: Negative for fever, chills, appetite change, fatigue and unexpected weight change.  Eyes: Negative for visual disturbance.  Respiratory: Negative for shortness of breath.   Cardiovascular: Negative for chest pain and leg swelling.  Gastrointestinal: Negative for nausea, vomiting, abdominal pain, diarrhea and constipation.  Musculoskeletal: Negative for myalgias and arthralgias.  Skin: Negative for color change and rash.  Hematological: Negative for adenopathy. Does not bruise/bleed easily.  Psychiatric/Behavioral: Negative for sleep disturbance and dysphoric mood. The patient is not nervous/anxious.        Objective:    BP 135/62 mmHg  Pulse 58  Temp(Src) 98 F (36.7 C) (Oral)  Ht 5\' 3"  (1.6 m)  Wt 198 lb 6 oz (89.982 kg)  BMI 35.15 kg/m2  SpO2 97% Physical  Exam  Constitutional: She is oriented to person, place, and time. She appears well-developed and well-nourished. No distress.  HENT:  Head: Normocephalic and atraumatic.  Right Ear: External ear normal.  Left Ear: External ear normal.  Nose: Nose normal.  Mouth/Throat: Oropharynx is clear and moist. No oropharyngeal exudate.  Eyes: Conjunctivae are normal. Pupils are equal, round, and reactive to light. Right eye exhibits no discharge. Left eye exhibits no discharge. No scleral icterus.  Neck: Normal range of motion. Neck supple. No tracheal deviation present. No thyromegaly present.  Cardiovascular: Normal rate, regular rhythm, normal heart sounds and intact distal pulses.  Exam reveals no gallop and no friction rub.   No murmur heard. Pulmonary/Chest: Effort normal and breath sounds normal. No respiratory distress. She  has no wheezes. She has no rales. She exhibits no tenderness.  Musculoskeletal: Normal range of motion. She exhibits no edema or tenderness.  Lymphadenopathy:    She has no cervical adenopathy.  Neurological: She is alert and oriented to person, place, and time. No cranial nerve deficit. She exhibits normal muscle tone. Coordination normal.  Skin: Skin is warm and dry. No rash noted. She is not diaphoretic. No erythema. No pallor.  Psychiatric: She has a normal mood and affect. Her behavior is normal. Judgment and thought content normal.          Assessment & Plan:   Problem List Items Addressed This Visit      High   Diabetes mellitus type 2, uncontrolled (HCC) - Primary    BG well controlled. A1c with labs. Continue current medication.      Relevant Orders   Comprehensive metabolic panel   Hemoglobin A1c   Hypertension    BP Readings from Last 3 Encounters:  06/18/15 135/62  03/06/15 153/62  02/24/15 157/76   BP well controlled. Renal function with labs today.Continue current medication.        Unprioritized   Hypothyroidism    Will check TSH with labs.      Relevant Orders   TSH       Return in about 3 months (around 09/18/2015) for Recheck of Diabetes.  Ronna Polio, MD Internal Medicine [facility]

## 2015-06-18 NOTE — Assessment & Plan Note (Signed)
>>  ASSESSMENT AND PLAN FOR HYPERTENSION ASSOCIATED WITH DIABETES (HCC) WRITTEN ON 06/18/2015  7:42 AM BY VANNIE, JENNIFER A, MD  BP Readings from Last 3 Encounters:  06/18/15 135/62  03/06/15 153/62  02/24/15 157/76   BP well controlled. Renal function with labs today.Continue current medication.

## 2015-06-18 NOTE — Assessment & Plan Note (Signed)
Will check TSH with labs. 

## 2015-06-18 NOTE — Progress Notes (Signed)
Pre visit review using our clinic review tool, if applicable. No additional management support is needed unless otherwise documented below in the visit note. 

## 2015-06-21 ENCOUNTER — Encounter: Payer: Self-pay | Admitting: Internal Medicine

## 2015-06-30 ENCOUNTER — Encounter: Payer: Self-pay | Admitting: Internal Medicine

## 2015-07-01 ENCOUNTER — Other Ambulatory Visit: Payer: Self-pay | Admitting: *Deleted

## 2015-07-01 MED ORDER — FUROSEMIDE 20 MG PO TABS: 20.0000 mg | ORAL_TABLET | Freq: Every day | ORAL | Status: DC

## 2015-07-12 ENCOUNTER — Encounter: Payer: Self-pay | Admitting: Internal Medicine

## 2015-07-12 ENCOUNTER — Other Ambulatory Visit: Payer: Self-pay | Admitting: *Deleted

## 2015-07-12 MED ORDER — CLOPIDOGREL BISULFATE 75 MG PO TABS: 75.0000 mg | ORAL_TABLET | Freq: Every day | ORAL | Status: DC

## 2015-07-30 ENCOUNTER — Encounter: Payer: Self-pay | Admitting: Internal Medicine

## 2015-07-30 ENCOUNTER — Other Ambulatory Visit: Payer: Self-pay

## 2015-07-30 MED ORDER — METOPROLOL TARTRATE 25 MG PO TABS: 25.0000 mg | ORAL_TABLET | Freq: Two times a day (BID) | ORAL | Status: DC

## 2015-09-02 ENCOUNTER — Other Ambulatory Visit: Payer: Self-pay | Admitting: Internal Medicine

## 2015-09-24 ENCOUNTER — Ambulatory Visit (INDEPENDENT_AMBULATORY_CARE_PROVIDER_SITE_OTHER): Payer: BLUE CROSS/BLUE SHIELD | Admitting: Internal Medicine

## 2015-09-24 ENCOUNTER — Encounter: Payer: Self-pay | Admitting: Internal Medicine

## 2015-09-24 VITALS — BP 144/70 | HR 54 | Ht 63.0 in | Wt 199.2 lb

## 2015-09-24 DIAGNOSIS — IMO0002 Reserved for concepts with insufficient information to code with codable children: Secondary | ICD-10-CM

## 2015-09-24 DIAGNOSIS — N183 Chronic kidney disease, stage 3 (moderate): Secondary | ICD-10-CM | POA: Diagnosis not present

## 2015-09-24 DIAGNOSIS — Z794 Long term (current) use of insulin: Secondary | ICD-10-CM | POA: Diagnosis not present

## 2015-09-24 DIAGNOSIS — E1122 Type 2 diabetes mellitus with diabetic chronic kidney disease: Secondary | ICD-10-CM

## 2015-09-24 DIAGNOSIS — I70299 Other atherosclerosis of native arteries of extremities, unspecified extremity: Secondary | ICD-10-CM

## 2015-09-24 DIAGNOSIS — I10 Essential (primary) hypertension: Secondary | ICD-10-CM

## 2015-09-24 DIAGNOSIS — E1165 Type 2 diabetes mellitus with hyperglycemia: Secondary | ICD-10-CM

## 2015-09-24 DIAGNOSIS — I708 Atherosclerosis of other arteries: Secondary | ICD-10-CM

## 2015-09-24 DIAGNOSIS — E039 Hypothyroidism, unspecified: Secondary | ICD-10-CM | POA: Diagnosis not present

## 2015-09-24 DIAGNOSIS — Q600 Renal agenesis, unilateral: Secondary | ICD-10-CM | POA: Diagnosis not present

## 2015-09-24 DIAGNOSIS — I701 Atherosclerosis of renal artery: Secondary | ICD-10-CM | POA: Diagnosis not present

## 2015-09-24 DIAGNOSIS — I700 Atherosclerosis of aorta: Secondary | ICD-10-CM

## 2015-09-24 LAB — COMPREHENSIVE METABOLIC PANEL
ALT: 8 U/L (ref 0–35)
AST: 15 U/L (ref 0–37)
Albumin: 4 g/dL (ref 3.5–5.2)
Alkaline Phosphatase: 130 U/L — ABNORMAL HIGH (ref 39–117)
BUN: 13 mg/dL (ref 6–23)
CHLORIDE: 104 meq/L (ref 96–112)
CO2: 28 meq/L (ref 19–32)
Calcium: 9.4 mg/dL (ref 8.4–10.5)
Creatinine, Ser: 0.86 mg/dL (ref 0.40–1.20)
GFR: 70.45 mL/min (ref >60.00)
GLUCOSE: 136 mg/dL — AB (ref 70–99)
POTASSIUM: 4.5 meq/L (ref 3.5–5.1)
SODIUM: 137 meq/L (ref 135–145)
Total Bilirubin: 0.4 mg/dL (ref 0.2–1.2)
Total Protein: 7.7 g/dL (ref 6.0–8.3)

## 2015-09-24 LAB — LIPID PANEL
CHOL/HDL RATIO: 4
Cholesterol: 165 mg/dL (ref 0–200)
HDL: 44.5 mg/dL (ref >39.00)
LDL CALC: 91 mg/dL (ref 0–99)
NonHDL: 120.22
TRIGLYCERIDES: 144 mg/dL (ref 0.0–149.0)
VLDL: 28.8 mg/dL (ref 0.0–40.0)

## 2015-09-24 LAB — HEMOGLOBIN A1C: Hgb A1c MFr Bld: 8 % — ABNORMAL HIGH (ref 4.6–6.5)

## 2015-09-24 LAB — TSH: TSH: 3.89 u[IU]/mL (ref 0.35–4.50)

## 2015-09-24 MED ORDER — LISINOPRIL 40 MG PO TABS: 40.0000 mg | ORAL_TABLET | Freq: Every day | ORAL | Status: DC

## 2015-09-24 MED ORDER — GLIPIZIDE ER 10 MG PO TB24: ORAL_TABLET | ORAL | Status: DC

## 2015-09-24 MED ORDER — FUROSEMIDE 20 MG PO TABS: 20.0000 mg | ORAL_TABLET | Freq: Every day | ORAL | Status: DC

## 2015-09-24 MED ORDER — CLOPIDOGREL BISULFATE 75 MG PO TABS
75.0000 mg | ORAL_TABLET | Freq: Every day | ORAL | Status: DC
Start: 2015-09-24 — End: 2016-07-18

## 2015-09-24 NOTE — Assessment & Plan Note (Signed)
BG well controlled by report. Will check A1c with labs. Continue Lantus 35units daily. Continue Glipizide. 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 Follow up in 3 months and prn.

## 2015-09-24 NOTE — Assessment & Plan Note (Signed)
Discussed adding statin drug after lipids back today. Pt has been reluctant because of chronic muscle pain issues.

## 2015-09-24 NOTE — Assessment & Plan Note (Signed)
BP Readings from Last 3 Encounters:  09/24/15 144/70  06/18/15 135/62  03/06/15 153/62   BP well controlled generally. Continue Metoprolol, Lisinopril, Amlodipine and Hydralazine.

## 2015-09-24 NOTE — Assessment & Plan Note (Signed)
>>  ASSESSMENT AND PLAN FOR HYPERTENSION ASSOCIATED WITH DIABETES (HCC) WRITTEN ON 09/24/2015  7:44 AM BY VANNIE, JENNIFER A, MD  BP Readings from Last 3 Encounters:  09/24/15 144/70  06/18/15 135/62  03/06/15 153/62   BP well controlled generally. Continue Metoprolol , Lisinopril , Amlodipine  and Hydralazine .

## 2015-09-24 NOTE — Assessment & Plan Note (Signed)
>>  ASSESSMENT AND PLAN FOR CONTROLLED DIABETES MELLITUS TYPE 2 WITH COMPLICATIONS (HCC) WRITTEN ON 09/24/2015  7:43 AM BY WALKER, JENNIFER A, MD  BG well controlled by report. Will check A1c with labs. Continue Lantus  35units daily. Continue Glipizide . Avoiding metformin  because of congenital single kidney and RAS. Follow up in 3 months and prn.

## 2015-09-24 NOTE — Progress Notes (Signed)
Pre visit review using our clinic review tool, if applicable. No additional management support is needed unless otherwise documented below in the visit note. 

## 2015-09-24 NOTE — Assessment & Plan Note (Signed)
Followed by nephrology and vascular. BP have been relatively well controlled and stable.

## 2015-09-24 NOTE — Progress Notes (Signed)
Subjective:    Patient ID: Danielle Park, female    DOB: 05/11/1950, 65 y.o.   MRN: [id number]  HPI  65YO female presents for follow up.  DM - BG running less than 150. Mostly near 125.Marland Kitchen. Compliant with medication. No low BG.  Lab Results  Component Value Date   HGBA1C 8.4* 06/18/2015   HTN - Does not generally check BP at home. No HA, CP, palpitations. Compliant with medication.  Wt Readings from Last 3 Encounters:  09/24/15 199 lb 3.2 oz (90.357 kg)  06/18/15 198 lb 6 oz (89.982 kg)  03/06/15 196 lb (88.905 kg)   BP Readings from Last 3 Encounters:  09/24/15 144/70  06/18/15 135/62  03/06/15 153/62    Past Medical History  Diagnosis Date  . Chicken pox   . Diabetes mellitus   . Hypertension   . UTI (urinary tract infection)   . Congenital single kidney   . 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 Hypertension Mother   . Diabetes Mother   . Heart disease Mother     s/p CABG x 2  . Hypertension Father   . Cancer Father     lung   . Heart disease Other   . Diabetes Other   . Diabetes Sister   . Heart disease Sister   . Heart disease Sister   . Cancer Sister     colon and ovary?   Past Surgical History  Procedure Laterality Date  . Cesarean section    . Vaginal delivery    . Renal artery stent  2014    [facility]. Wyn Quakerew [facility]  . Peripheral vascular catheterization N/A 01/11/2015    Procedure: Abdominal Aortogram w/Lower Extremity;  Surgeon: Annice NeedyJason S Dew, MD;  Location: [facility] INVASIVE CV LAB;  Service: Cardiovascular;  Laterality: N/A;  . Peripheral vascular catheterization  01/11/2015    Procedure: Lower Extremity Intervention;  Surgeon: Annice NeedyJason S Dew, MD;  Location: [facility] INVASIVE CV LAB;  Service: Cardiovascular;;   Social History   Social History  . Marital Status: Married    Spouse Name: N/A  . Number of Children: N/A  . Years of Education: N/A   Social History  Main Topics  . Smoking status: Never Smoker   . Smokeless tobacco: None  . Alcohol Use: No  . Drug Use: None  . Sexual Activity: Not Asked   Other Topics Concern  . None   Social History Narrative   Lives in [place] with husband. 1 dog      Work - Administrator, [employer]   Diet - regular diet   Exercise - occasional, very active at work    Review of Systems  Constitutional: Negative for fever, chills, appetite change, fatigue and unexpected weight change.  Eyes: Negative for visual disturbance.  Respiratory: Negative for shortness of breath.   Cardiovascular: Negative for chest pain, palpitations and leg swelling.  Gastrointestinal: Negative for nausea, vomiting, abdominal pain, diarrhea and constipation.  Endocrine: Negative for polydipsia, polyphagia and polyuria.  Skin: Negative for color change and rash.  Neurological: Negative for headaches.  Hematological: Negative for adenopathy. Does not bruise/bleed easily.  Psychiatric/Behavioral: Negative for dysphoric mood. The patient is not nervous/anxious.        Objective:    BP 144/70 mmHg  Pulse 54  Ht 5\' 3"  (1.6 m)  Wt 199 lb 3.2 oz (90.357 kg)  BMI 35.30 kg/m2  SpO2 97% Physical Exam  Constitutional: She is oriented to person, place, and time. She appears well-developed and well-nourished. No distress.  HENT:  Head: Normocephalic and atraumatic.  Right Ear: External ear normal.  Left Ear: External ear normal.  Nose: Nose normal.  Mouth/Throat: Oropharynx is clear and moist. No oropharyngeal exudate.  Eyes: Conjunctivae are normal. Pupils are equal, round, and reactive to light. Right eye exhibits no discharge. Left eye exhibits no discharge. No scleral icterus.  Neck: Normal range of motion. Neck supple. No tracheal deviation present. No thyromegaly present.  Cardiovascular: Normal rate, regular rhythm, normal heart sounds and intact distal pulses.  Exam reveals no gallop and no friction rub.   No murmur  heard. Pulmonary/Chest: Effort normal and breath sounds normal. No respiratory distress. She has no wheezes. She has no rales. She exhibits no tenderness.  Musculoskeletal: Normal range of motion. She exhibits no edema or tenderness.  Lymphadenopathy:    She has no cervical adenopathy.  Neurological: She is alert and oriented to person, place, and time. No cranial nerve deficit. She exhibits normal muscle tone. Coordination normal.  Skin: Skin is warm and dry. No rash noted. She is not diaphoretic. No erythema. No pallor.  Psychiatric: She has a normal mood and affect. Her behavior is normal. Judgment and thought content normal.          Assessment & Plan:   Problem List Items Addressed This Visit      High   Aorto-iliac atherosclerosis (HCC) (Chronic)    Discussed adding statin drug after lipids back today. Pt has been reluctant because of chronic muscle pain issues.       Relevant Medications   lisinopril (PRINIVIL,ZESTRIL) 40 MG tablet   furosemide (LASIX) 20 MG tablet   Congenital single kidney (Chronic)    Followed by nephrology and vascular. BP have been relatively well controlled and stable.      Diabetes mellitus type 2, uncontrolled (HCC) - Primary (Chronic)    BG well controlled by report. Will check A1c with labs. Continue Lantus 35units daily. Continue Glipizide. 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 Follow up in 3 months and prn.      Relevant Medications   lisinopril (PRINIVIL,ZESTRIL) 40 MG tablet   glipiZIDE (GLUCOTROL XL) 10 MG 24 hr tablet   Other Relevant Orders   Comprehensive metabolic panel   Hemoglobin A1c   Hypertension (Chronic)    BP Readings from Last 3 Encounters:  09/24/15 144/70  06/18/15 135/62  03/06/15 153/62   BP well controlled generally. Continue Metoprolol, Lisinopril, Amlodipine and Hydralazine.       Relevant Medications   lisinopril (PRINIVIL,ZESTRIL) 40 MG tablet   furosemide (LASIX) 20 MG tablet    Renal artery stenosis (HCC) (Chronic)   Relevant Medications   lisinopril (PRINIVIL,ZESTRIL) 40 MG tablet   furosemide (LASIX) 20 MG tablet   Other Relevant Orders   Lipid panel     Unprioritized   Hypothyroidism   Relevant Orders   TSH       Return in about 3 months (around 12/25/2015) for New Patient.  Ronna PolioJennifer Damian Buckles, MD Internal Medicine [facility]

## 2015-09-24 NOTE — Patient Instructions (Signed)
Try limiting carbohydrates to 100gm per day or less.  Labs today.

## 2015-11-25 ENCOUNTER — Other Ambulatory Visit: Payer: Self-pay | Admitting: Surgical

## 2015-11-25 MED ORDER — LEVOTHYROXINE SODIUM 75 MCG PO TABS: ORAL_TABLET | ORAL | 0 refills | Status: DC

## 2015-12-08 DIAGNOSIS — M79609 Pain in unspecified limb: Secondary | ICD-10-CM | POA: Insufficient documentation

## 2015-12-08 DIAGNOSIS — I7025 Atherosclerosis of native arteries of other extremities with ulceration: Secondary | ICD-10-CM | POA: Insufficient documentation

## 2015-12-27 ENCOUNTER — Ambulatory Visit: Payer: BLUE CROSS/BLUE SHIELD | Admitting: Family Medicine

## 2015-12-31 ENCOUNTER — Other Ambulatory Visit (INDEPENDENT_AMBULATORY_CARE_PROVIDER_SITE_OTHER): Payer: Self-pay | Admitting: Vascular Surgery

## 2015-12-31 ENCOUNTER — Encounter (INDEPENDENT_AMBULATORY_CARE_PROVIDER_SITE_OTHER): Payer: Medicare Other

## 2015-12-31 ENCOUNTER — Ambulatory Visit (INDEPENDENT_AMBULATORY_CARE_PROVIDER_SITE_OTHER): Payer: Medicare Other | Admitting: Vascular Surgery

## 2015-12-31 ENCOUNTER — Encounter (INDEPENDENT_AMBULATORY_CARE_PROVIDER_SITE_OTHER): Payer: Self-pay | Admitting: Vascular Surgery

## 2015-12-31 VITALS — BP 145/70 | HR 55 | Resp 17 | Ht 62.5 in | Wt 194.0 lb

## 2015-12-31 DIAGNOSIS — I739 Peripheral vascular disease, unspecified: Secondary | ICD-10-CM | POA: Insufficient documentation

## 2015-12-31 DIAGNOSIS — E1322 Other specified diabetes mellitus with diabetic chronic kidney disease: Secondary | ICD-10-CM | POA: Diagnosis not present

## 2015-12-31 DIAGNOSIS — M79605 Pain in left leg: Secondary | ICD-10-CM

## 2015-12-31 DIAGNOSIS — Z794 Long term (current) use of insulin: Secondary | ICD-10-CM

## 2015-12-31 DIAGNOSIS — I10 Essential (primary) hypertension: Secondary | ICD-10-CM | POA: Diagnosis not present

## 2015-12-31 DIAGNOSIS — Z905 Acquired absence of kidney: Secondary | ICD-10-CM | POA: Diagnosis not present

## 2015-12-31 DIAGNOSIS — M79604 Pain in right leg: Secondary | ICD-10-CM

## 2015-12-31 DIAGNOSIS — N183 Chronic kidney disease, stage 3 (moderate): Secondary | ICD-10-CM | POA: Diagnosis not present

## 2015-12-31 DIAGNOSIS — I701 Atherosclerosis of renal artery: Secondary | ICD-10-CM | POA: Diagnosis not present

## 2015-12-31 DIAGNOSIS — I82412 Acute embolism and thrombosis of left femoral vein: Secondary | ICD-10-CM | POA: Insufficient documentation

## 2015-12-31 DIAGNOSIS — I150 Renovascular hypertension: Secondary | ICD-10-CM | POA: Diagnosis not present

## 2015-12-31 NOTE — Progress Notes (Deleted)
MRN : [id number]  Danielle Park is a 65 y.o. (05-23-1950) female who presents with chief complaint of  Chief Complaint  Patient presents with  . Follow-up    ultrasound  .  History of Present Illness: Patient returns today in follow up of Multiple vascular issues. She had intervention to her lower extremities for claudication about a year ago. This has resulted in resolution of her claudication symptoms in her legs are doing quite well.  Her ABIs are normal and her duplex does not show any high grade stenosis on the left.   She also had right renal artery intervention about 3 years ago. She reports good blood pressure control with no worsening renal function as far she knows. 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 These velocities what appeared to be at least 60% or greater in the right renal artery stent.  Current Outpatient Prescriptions  Medication Sig Dispense Refill  . amLODipine (NORVASC) 10 MG tablet TAKE 1 TABLET BY MOUTH ONCE DAILY 90 tablet 3  . aspirin 81 MG tablet Take 81 mg by mouth daily.    . clopidogrel (PLAVIX) 75 MG tablet Take 1 tablet (75 mg total) by mouth daily. 30 tablet 11  . furosemide (LASIX) 20 MG tablet Take 1 tablet (20 mg total) by mouth daily. 30 tablet 11  . glipiZIDE (GLUCOTROL XL) 10 MG 24 hr tablet TAKE ONE TABLET BY MOUTH ONCE DAILY 90 tablet 3  . hydrALAZINE (APRESOLINE) 25 MG tablet Take 25 mg by mouth 2 (two) times daily as needed.    . Insulin Glargine (LANTUS SOLOSTAR) 100 UNIT/ML Solostar Pen Inject 35 Units into the skin daily at 10 pm. 5 pen 11  . levothyroxine (SYNTHROID, LEVOTHROID) 75 MCG tablet TAKE ONE TABLET BY MOUTH ONCE DAILY BEFORE BREAKFAST 90 tablet 0  . lisinopril (PRINIVIL,ZESTRIL) 40 MG tablet Take 1 tablet (40 mg total) by mouth daily. 90 tablet 3  . metoprolol tartrate (LOPRESSOR) 25 MG tablet Take 1 tablet (25 mg total) by mouth 2 (two)  times daily. 180 tablet 1  . NOVOFINE 32G X 6 MM MISC USE AS DIRECTED WITH LEVEMIR PEN 100 each 3   No current facility-administered medications for this visit.     Past Medical History:  Diagnosis Date  . Chicken pox   . 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 Congenital single kidney   . Diabetes mellitus   . Hypertension   . UTI (urinary tract infection)     Past Surgical History:  Procedure Laterality Date  . CESAREAN SECTION    . PERIPHERAL VASCULAR CATHETERIZATION N/A 01/11/2015   Procedure: Abdominal Aortogram w/Lower Extremity;  Surgeon: Annice NeedyJason S Dew, MD;  Location: [facility] INVASIVE CV LAB;  Service: Cardiovascular;  Laterality: N/A;  . PERIPHERAL VASCULAR CATHETERIZATION  01/11/2015   Procedure: Lower Extremity Intervention;  Surgeon: Annice NeedyJason S Dew, MD;  Location: [facility] INVASIVE CV LAB;  Service: Cardiovascular;;  . RENAL ARTERY STENT  2014   [facility] Wyn Qu[facility]rew at [facility]  . VAGINAL DELIVERY      Social History Social History  Substance Use Topics  . Smoking status: Never Smoker  . Smokeless tobacco: Not on file  . Alcohol use No  No IV drug use  Family History Family History  Problem Relation Age of Onset  . Hypertension Mother   . Diabetes Mother   .  Heart disease Mother     s/p CABG x 2  . Hypertension Father   . Cancer Father     lung   . Diabetes Sister   . Heart disease Sister   . Heart disease Sister   . Cancer Sister     colon and ovary?  . Heart disease Other   . Diabetes Other      No Known Allergies   REVIEW OF SYSTEMS (Negative unless checked)  Constitutional: [] Weight loss  [] Fever  [] Chills Cardiac: [] Chest pain   [] Chest pressure   [] Palpitations   [] Shortness of breath when laying flat   [] Shortness of breath at rest   [] Shortness of breath with exertion. Vascular:  [] Pain in legs with walking   [] Pain in legs at rest   [] Pain in legs when laying flat   [] Claudication   [] Pain in feet when walking  [] Pain  in feet at rest  [] Pain in feet when laying flat   [] History of DVT   [] Phlebitis   [] Swelling in legs   [] Varicose veins   [] Non-healing ulcers Pulmonary:   [] Uses home oxygen   [] Productive cough   [] Hemoptysis   [] Wheeze  [] COPD   [] Asthma Neurologic:  [] Dizziness  [] Blackouts   [] Seizures   [] History of stroke   [] History of TIA  [] Aphasia   [] Temporary blindness   [] Dysphagia   [] Weakness or numbness in arms   [] Weakness or numbness in legs Musculoskeletal:  [] Arthritis   [] Joint swelling   [] Joint pain   [] Low back pain Hematologic:  [] Easy bruising  [] Easy bleeding   [] Hypercoagulable state   [] Anemic   Gastrointestinal:  [] Blood in stool   [] Vomiting blood  [] Gastroesophageal reflux/heartburn   [] Abdominal pain Genitourinary:  [] Chronic kidney disease   [] Difficult urination  [] Frequent urination  [] Burning with urination   [x] Hematuria Skin:  [] Rashes   [] Ulcers   [] Wounds Psychological:  [] History of anxiety   []  History of major depression.  Physical Examination  BP (!) 145/70   Pulse (!) 55   Resp 17   Ht 5' 2.5" (1.588 m)   Wt 194 lb (88 kg)   BMI 34.92 kg/m  Gen:  WD/WN, NAD Head: [place]/AT, No temporalis wasting. Ear/Nose/Throat: Hearing grossly intact, nares w/o erythema or drainage, trachea midline Eyes: PERRLA, EOMI. Sclera non-icteric Neck: Supple, no nuchal rigidity.  No JVD.  Pulmonary:  Good air movement, no use of accessory muscles.  Cardiac: RRR, normal S1, S2 Vascular:  Vessel Right Left  Radial Palpable Palpable  Ulnar Palpable Palpable  Brachial Palpable Palpable      Aorta Not palpable N/A  Femoral Palpable Palpable  Popliteal Palpable Palpable  PT Palpable Palpable  DP Palpable Palpable   Gastrointestinal: soft, non-tender/non-distended. No guarding/reflex.  Musculoskeletal: M/S 5/5 throughout.  No deformity or atrophy. No edema. Neurologic: CN 2-12 intact. Pain and light touch intact in extremities.  Symmetrical.  Speech is fluent.  Psychiatric:  Judgment intact, Mood & affect appropriate for pt's clinical situation. Dermatologic: No rashes or ulcers noted.  No cellulitis or open wounds. Lymph : No Cervical, Axillary, or Inguinal lymphadenopathy.    Labs No results found for this or any previous visit (from the past 2160 hour(s)).  Radiology No results found.   Assessment/Plan 1. Renal artery stenosis. Moderately elevated velocities in the right renal artery stent but her blood pressure control is good and her renal function is stable. Plan to recheck in 6 months. 2. Renovascular hypertension. Stable. Blood pressure control pretty  good after intervention 3 years ago. 3. Peripheral vascular disease.  Her ABIs are normal and her duplex does not show any high grade stenosis on the left.  Her symptoms are resolved after intervention. Plan to recheck in 1 year  Festus Barren, MD  12/31/2015 2:03 PM    This note was created with Dragon medical transcription system.  Any errors from dictation are purely unintentional

## 2015-12-31 NOTE — Addendum Note (Signed)
Addended by: Annice NeedyEW, Martasia Talamante S on: 12/31/2015 02:07 PM   Modules accepted: Orders

## 2015-12-31 NOTE — Assessment & Plan Note (Signed)
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 Still having some swelling. IVC filter is in place. No new recommendations other than continued compression stocking use and leg elevation.

## 2015-12-31 NOTE — Assessment & Plan Note (Signed)
>>  ASSESSMENT AND PLAN FOR CONTROLLED DIABETES MELLITUS TYPE 2 WITH COMPLICATIONS (HCC) WRITTEN ON 12/31/2015 10:41 AM BY DEW, JASON S, MD  blood glucose control important in reducing the progression of atherosclerotic disease. Also, involved in wound healing. On appropriate medications.

## 2015-12-31 NOTE — Assessment & Plan Note (Signed)
Duplex today shows moderately elevated velocities in the right renal artery stent. Her left kidney is absent. Blood pressure control has been much better after intervention. She reports that it remains good. With her moderately elevated velocities in the renal artery stent, this will be monitored closely but no intervention will be planned currently. I will plan to see her back in 6 months with duplex and follow-up.

## 2015-12-31 NOTE — Assessment & Plan Note (Signed)
Blood pressure control has been much better after intervention. She reports that it remains good. With her moderately elevated velocities in the renal artery stent, this will be monitored closely but no intervention will be planned currently.

## 2015-12-31 NOTE — Assessment & Plan Note (Signed)
  Patient's carotid duplex today shows stable stenosis in the 40-59% range on the right and in the 1-39% range on the left. The velocities are actually lower than they were on his last visit. Continue current medical regimen. Plan to recheck in 6 months, and if it remains stable we'll go to an annual check.

## 2015-12-31 NOTE — Assessment & Plan Note (Signed)
blood pressure control important in reducing the progression of atherosclerotic disease. On appropriate oral medications.  

## 2015-12-31 NOTE — Assessment & Plan Note (Signed)
left

## 2015-12-31 NOTE — Assessment & Plan Note (Signed)
Patient is doing well s/p intervention about a year ago.  Her claudication has resolved.  Her ABIs are normal and her duplex does not show any high grade stenosis on the left.

## 2015-12-31 NOTE — Assessment & Plan Note (Signed)
blood glucose control important in reducing the progression of atherosclerotic disease. Also, involved in wound healing. On appropriate medications.  

## 2015-12-31 NOTE — Assessment & Plan Note (Signed)
Stable. No new symptoms. To be checked next year.

## 2016-01-17 ENCOUNTER — Encounter: Payer: Self-pay | Admitting: Family Medicine

## 2016-01-17 ENCOUNTER — Ambulatory Visit (INDEPENDENT_AMBULATORY_CARE_PROVIDER_SITE_OTHER): Payer: Medicare Other | Admitting: Family Medicine

## 2016-01-17 VITALS — BP 148/82 | HR 64 | Temp 98.6°F | Wt 203.4 lb

## 2016-01-17 DIAGNOSIS — I739 Peripheral vascular disease, unspecified: Secondary | ICD-10-CM | POA: Diagnosis not present

## 2016-01-17 DIAGNOSIS — I150 Renovascular hypertension: Secondary | ICD-10-CM

## 2016-01-17 DIAGNOSIS — I701 Atherosclerosis of renal artery: Secondary | ICD-10-CM | POA: Diagnosis not present

## 2016-01-17 DIAGNOSIS — Z794 Long term (current) use of insulin: Secondary | ICD-10-CM

## 2016-01-17 DIAGNOSIS — E1151 Type 2 diabetes mellitus with diabetic peripheral angiopathy without gangrene: Secondary | ICD-10-CM

## 2016-01-17 MED ORDER — HYDRALAZINE HCL 25 MG PO TABS: 25.0000 mg | ORAL_TABLET | Freq: Three times a day (TID) | ORAL | 1 refills | Status: DC

## 2016-01-17 MED ORDER — ROSUVASTATIN CALCIUM 10 MG PO TABS: 10.0000 mg | ORAL_TABLET | Freq: Every day | ORAL | 3 refills | Status: DC

## 2016-01-17 NOTE — Assessment & Plan Note (Signed)
Per patient report blood pressure has been much better controlled after having stenting procedure last year. Blood pressure still not at goal given that she has diabetes and vascular issues. She is only taking hydralazine twice a day. We will increase to 3 times a day and could consider 4 times a day in the future. She reports she cannot take 50 mg of this as it causes her blood pressure to drop too low at that dose. She'll continue her other blood pressure medications. She'll continue to monitor blood pressure. I discussed a goal of less than 140/90 at this time. She'll follow-up in one month for nurse visit for blood pressure check and in 3 months with me.

## 2016-01-17 NOTE — Progress Notes (Signed)
Tommi Rumps, MD Phone: [phone]  Danielle Park is a 65 y.o. female who presents today for follow-up.  HYPERTENSION  Disease Monitoring  Home BP Monitoring 111-168/50-74, about 50% of her blood pressures are greater than 315 systolic Chest pain- no    Dyspnea- no Medications  Compliance-  taking metoprolol, lisinopril, hydralazine, Lasix, and amlodipine  Edema- no  DIABETES Disease Monitoring: Blood Sugar ranges-100-189 Polyuria/phagia/dipsia- no      ophthalmology- has not seen Medications: Compliance- taking Lantus 37 units, glipizide 10 mg Hypoglycemic symptoms- no  Patient is followed by vascular surgery for peripheral vascular disease. She notes her legs are progressively improving since she had angioplasty done last year. Note she is able to walk around [place] now. No leg pain. She is taking aspirin and Plavix. She notes she is not on a statin given cost issues. She's going to have insurance that'll cover medications in November.  PMH: nonsmoker.   ROS see history of present illness  Objective  Physical Exam Vitals:   01/17/16 1557 01/17/16 1622  BP: (!) 154/72 (!) 148/82  Pulse: 64   Temp: 98.6 F (37 C)     BP Readings from Last 3 Encounters:  01/17/16 (!) 148/82  12/31/15 (!) 145/70  09/24/15 (!) 144/70   Wt Readings from Last 3 Encounters:  01/17/16 203 lb 6 oz (92.3 kg)  12/31/15 194 lb (88 kg)  09/24/15 199 lb 3.2 oz (90.4 kg)    Physical Exam  Constitutional: She is well-developed, well-nourished, and in no distress.  Cardiovascular: Normal rate, regular rhythm and normal heart sounds.   Pulmonary/Chest: Effort normal and breath sounds normal.  Musculoskeletal: She exhibits no edema.  Neurological: She is alert. Gait normal.  Skin: Skin is warm and dry.     Assessment/Plan: Please see individual problem list.  PVD (peripheral vascular disease) ([place]) Doing well with regards to this. Following with vascular surgery. Discussed starting a  statin to help with this and prevent progression of any vascular disease. She is willing to start on Crestor. This will be sent to her pharmacy.  Diabetes ([place]) Blood sugars somewhat well controlled. We'll check an A1c today to determine next step in management. Ophthalmology referral placed for yearly diabetic eye exam.  Renovascular hypertension Per patient report blood pressure has been much better controlled after having stenting procedure last year. Blood pressure still not at goal given that she has diabetes and vascular issues. She is only taking hydralazine twice a day. We will increase to 3 times a day and could consider 4 times a day in the future. She reports she cannot take 50 mg of this as it causes her blood pressure to drop too low at that dose. She'll continue her other blood pressure medications. She'll continue to monitor blood pressure. I discussed a goal of less than 140/90 at this time. She'll follow-up in one month for nurse visit for blood pressure check and in 3 months with me.   Orders Placed This Encounter  Procedures  . HgB A1c  . Comp Met (CMET)  . Ambulatory referral to Ophthalmology    Referral Priority:   Routine    Referral Type:   Consultation    Referral Reason:   Specialty Services Required    Requested Specialty:   Ophthalmology    Number of Visits Requested:   1    Meds ordered this encounter  Medications  . rosuvastatin (CRESTOR) 10 MG tablet    Sig: Take 1 tablet (10 mg total) by  mouth daily.    Dispense:  90 tablet    Refill:  3  . hydrALAZINE (APRESOLINE) 25 MG tablet    Sig: Take 1 tablet (25 mg total) by mouth 3 (three) times daily.    Dispense:  270 tablet    Refill:  [place], MD [place]

## 2016-01-17 NOTE — Patient Instructions (Signed)
Nice to see you. Please continue your current diabetes regimen. We will check your A1c today. Please continue to take your blood pressure medicines. We will increase your hydralazine to 25 mg 3 times daily. You should continue to monitor your blood pressure. Your goal is less than 140/90. Please continue to monitor your legs. We are going to start you on Crestor.

## 2016-01-17 NOTE — Assessment & Plan Note (Signed)
>>  ASSESSMENT AND PLAN FOR CONTROLLED DIABETES MELLITUS TYPE 2 WITH COMPLICATIONS (HCC) WRITTEN ON 01/17/2016  6:04 PM BY SONNENBERG, ERIC G, MD  Blood sugars somewhat well controlled. We'll check an A1c today to determine next step in management. Ophthalmology referral placed for yearly diabetic eye exam.

## 2016-01-17 NOTE — Assessment & Plan Note (Signed)
Doing well with regards to this. Following with vascular surgery. Discussed starting a statin to help with this and prevent progression of any vascular disease. She is willing to start on Crestor. This will be sent to her pharmacy.

## 2016-01-17 NOTE — Assessment & Plan Note (Signed)
Blood sugars somewhat well controlled. We'll check an A1c today to determine next step in management. Ophthalmology referral placed for yearly diabetic eye exam.

## 2016-01-17 NOTE — Progress Notes (Signed)
Pre visit review using our clinic review tool, if applicable. No additional management support is needed unless otherwise documented below in the visit note. 

## 2016-01-18 LAB — COMPREHENSIVE METABOLIC PANEL
ALT: 6 U/L (ref 0–35)
AST: 13 U/L (ref 0–37)
Albumin: 4.3 g/dL (ref 3.5–5.2)
Alkaline Phosphatase: 140 U/L — ABNORMAL HIGH (ref 39–117)
BILIRUBIN TOTAL: 0.4 mg/dL (ref 0.2–1.2)
BUN: 14 mg/dL (ref 6–23)
CALCIUM: 9.6 mg/dL (ref 8.4–10.5)
CHLORIDE: 100 meq/L (ref 96–112)
CO2: 28 meq/L (ref 19–32)
Creatinine, Ser: 1.05 mg/dL (ref 0.40–1.20)
GFR: 55.9 mL/min — AB (ref >60.00)
Glucose, Bld: 152 mg/dL — ABNORMAL HIGH (ref 70–99)
POTASSIUM: 4.6 meq/L (ref 3.5–5.1)
Sodium: 134 mEq/L — ABNORMAL LOW (ref 135–145)
Total Protein: 8 g/dL (ref 6.0–8.3)

## 2016-01-18 LAB — HEMOGLOBIN A1C: Hgb A1c MFr Bld: 8.3 % — ABNORMAL HIGH (ref 4.6–6.5)

## 2016-01-20 ENCOUNTER — Other Ambulatory Visit: Payer: Self-pay | Admitting: Surgical

## 2016-01-20 ENCOUNTER — Encounter: Payer: Self-pay | Admitting: Family Medicine

## 2016-01-20 MED ORDER — METOPROLOL TARTRATE 25 MG PO TABS: 25.0000 mg | ORAL_TABLET | Freq: Two times a day (BID) | ORAL | 1 refills | Status: DC

## 2016-01-28 ENCOUNTER — Other Ambulatory Visit: Payer: Self-pay | Admitting: Family Medicine

## 2016-01-28 DIAGNOSIS — R748 Abnormal levels of other serum enzymes: Secondary | ICD-10-CM

## 2016-02-03 ENCOUNTER — Other Ambulatory Visit: Payer: Self-pay | Admitting: Family Medicine

## 2016-02-03 MED ORDER — EMPAGLIFLOZIN 10 MG PO TABS: 10.0000 mg | ORAL_TABLET | Freq: Every day | ORAL | 3 refills | Status: DC

## 2016-02-04 ENCOUNTER — Ambulatory Visit
Admission: RE | Admit: 2016-02-04 | Discharge: 2016-02-04 | Disposition: A | Discharge status: Home / Self Care | Payer: Medicare Other | Source: Ambulatory Visit | Attending: Family Medicine | Admitting: Family Medicine

## 2016-02-04 DIAGNOSIS — R748 Abnormal levels of other serum enzymes: Secondary | ICD-10-CM | POA: Insufficient documentation

## 2016-02-04 DIAGNOSIS — R932 Abnormal findings on diagnostic imaging of liver and biliary tract: Secondary | ICD-10-CM | POA: Insufficient documentation

## 2016-02-16 ENCOUNTER — Other Ambulatory Visit: Payer: Self-pay | Admitting: Surgical

## 2016-02-16 MED ORDER — AMLODIPINE BESYLATE 10 MG PO TABS: 10.0000 mg | ORAL_TABLET | Freq: Every day | ORAL | 1 refills | Status: DC

## 2016-02-21 ENCOUNTER — Ambulatory Visit (INDEPENDENT_AMBULATORY_CARE_PROVIDER_SITE_OTHER): Payer: Medicare Other

## 2016-02-21 VITALS — BP 136/68 | HR 66 | Resp 18

## 2016-02-21 DIAGNOSIS — I10 Essential (primary) hypertension: Secondary | ICD-10-CM

## 2016-02-21 LAB — HM DIABETES EYE EXAM

## 2016-02-21 NOTE — Progress Notes (Signed)
Patient came in for a BP check.  At last OV on 10/23 increased Hydralazine to three times a day.  Patient has been checking BP at home and per patient it was good.  No numbers provided today.  Checked BP in bilateral upper extremities.  See vitals for details.  Please advise. thanks

## 2016-02-21 NOTE — Progress Notes (Signed)
Please contact the patient and see if she can provide numbers from home blood pressure checks. If her BP at home is not less than 130/90 at home we will need to add an additional medication.  Marikay AlarEric Marketta Valadez, M.D.

## 2016-02-22 DIAGNOSIS — E113393 Type 2 diabetes mellitus with moderate nonproliferative diabetic retinopathy without macular edema, bilateral: Secondary | ICD-10-CM | POA: Diagnosis not present

## 2016-02-25 ENCOUNTER — Telehealth: Payer: Self-pay | Admitting: Family Medicine

## 2016-02-25 NOTE — Telephone Encounter (Signed)
See additional note in the chart. thanks

## 2016-02-25 NOTE — Progress Notes (Signed)
Spoke with the patient, she was not able to tell me exact numbers as she was in the car, but she will call back later today to give them to us, she states that she may have a few that are less then 130/90 but majority are great then that.  Please advise.

## 2016-02-25 NOTE — Progress Notes (Signed)
Patient called back with BP readings as follows 131/55, 121/59, 141/59, 153/68, 144/61, 138/64, 104/51 (am reading) these are taking during various times throughout day.

## 2016-02-25 NOTE — Telephone Encounter (Signed)
Pt called back returning your call. Thank you!  Call pt @ [phone]

## 2016-02-29 ENCOUNTER — Other Ambulatory Visit: Payer: Self-pay | Admitting: Family Medicine

## 2016-03-01 ENCOUNTER — Other Ambulatory Visit: Payer: Self-pay | Admitting: Family Medicine

## 2016-03-01 MED ORDER — LEVOTHYROXINE SODIUM 75 MCG PO TABS: ORAL_TABLET | ORAL | 1 refills | Status: DC

## 2016-03-07 ENCOUNTER — Encounter: Payer: Self-pay | Admitting: Surgical

## 2016-03-09 NOTE — Progress Notes (Signed)
Given patient's low diastolic blood pressures at home I'm hesitant to add anything to her blood pressure regimen at this time. She should continue to monitor. We can follow up as scheduled in January.

## 2016-03-10 ENCOUNTER — Encounter: Payer: Self-pay | Admitting: Family Medicine

## 2016-03-13 ENCOUNTER — Encounter: Payer: Self-pay | Admitting: Family Medicine

## 2016-03-13 ENCOUNTER — Other Ambulatory Visit: Payer: Self-pay

## 2016-03-13 DIAGNOSIS — E1122 Type 2 diabetes mellitus with diabetic chronic kidney disease: Secondary | ICD-10-CM

## 2016-03-13 DIAGNOSIS — E1165 Type 2 diabetes mellitus with hyperglycemia: Principal | ICD-10-CM

## 2016-03-13 DIAGNOSIS — IMO0002 Reserved for concepts with insufficient information to code with codable children: Secondary | ICD-10-CM

## 2016-03-13 DIAGNOSIS — Z794 Long term (current) use of insulin: Principal | ICD-10-CM

## 2016-03-13 DIAGNOSIS — N183 Chronic kidney disease, stage 3 (moderate): Principal | ICD-10-CM

## 2016-03-13 MED ORDER — INSULIN GLARGINE 100 UNIT/ML SOLOSTAR PEN: 35.0000 [IU] | PEN_INJECTOR | Freq: Every day | SUBCUTANEOUS | 5 refills | Status: DC

## 2016-03-13 NOTE — Progress Notes (Signed)
Left message to call on cell phone  

## 2016-03-13 NOTE — Telephone Encounter (Signed)
Patient is requesting refill, last filled by Dr.Walker 02/24/15 35 units #[id number]

## 2016-03-13 NOTE — Telephone Encounter (Signed)
Sent to pharmacy 

## 2016-03-14 ENCOUNTER — Other Ambulatory Visit: Payer: Self-pay | Admitting: Family Medicine

## 2016-03-14 DIAGNOSIS — Z794 Long term (current) use of insulin: Principal | ICD-10-CM

## 2016-03-14 DIAGNOSIS — IMO0002 Reserved for concepts with insufficient information to code with codable children: Secondary | ICD-10-CM

## 2016-03-14 DIAGNOSIS — N183 Chronic kidney disease, stage 3 (moderate): Principal | ICD-10-CM

## 2016-03-14 DIAGNOSIS — E1165 Type 2 diabetes mellitus with hyperglycemia: Principal | ICD-10-CM

## 2016-03-14 DIAGNOSIS — E1122 Type 2 diabetes mellitus with diabetic chronic kidney disease: Secondary | ICD-10-CM

## 2016-03-14 NOTE — Telephone Encounter (Signed)
error 

## 2016-04-17 ENCOUNTER — Telehealth: Payer: Self-pay

## 2016-04-17 NOTE — Telephone Encounter (Signed)
-----   Message from Glori LuisEric G Sonnenberg, MD sent at 04/17/2016  1:46 PM EST ----- Regarding: call patient Please contact the patient to see if she would be willing to start on an additional blood pressure medication. I reviewed her home BPs that she sent in a mychart message and responded to that message though it looks like she has not reviewed the message yet. Her BPs were higher than goal and she could benefit from an additional medication.   Dr Birdie SonsSonnenberg

## 2016-04-17 NOTE — Telephone Encounter (Signed)
Left message to return call 

## 2016-04-18 ENCOUNTER — Ambulatory Visit (INDEPENDENT_AMBULATORY_CARE_PROVIDER_SITE_OTHER): Payer: PPO | Admitting: Family Medicine

## 2016-04-18 ENCOUNTER — Encounter: Payer: Self-pay | Admitting: Family Medicine

## 2016-04-18 VITALS — BP 156/74 | HR 66 | Temp 98.2°F | Wt 196.0 lb

## 2016-04-18 DIAGNOSIS — R600 Localized edema: Secondary | ICD-10-CM

## 2016-04-18 DIAGNOSIS — I150 Renovascular hypertension: Secondary | ICD-10-CM

## 2016-04-18 DIAGNOSIS — G8929 Other chronic pain: Secondary | ICD-10-CM | POA: Diagnosis not present

## 2016-04-18 DIAGNOSIS — M25571 Pain in right ankle and joints of right foot: Secondary | ICD-10-CM | POA: Insufficient documentation

## 2016-04-18 DIAGNOSIS — M546 Pain in thoracic spine: Secondary | ICD-10-CM | POA: Diagnosis not present

## 2016-04-18 DIAGNOSIS — E119 Type 2 diabetes mellitus without complications: Secondary | ICD-10-CM | POA: Diagnosis not present

## 2016-04-18 DIAGNOSIS — Z794 Long term (current) use of insulin: Secondary | ICD-10-CM | POA: Diagnosis not present

## 2016-04-18 HISTORY — DX: Pain in right ankle and joints of right foot: M25.571

## 2016-04-18 NOTE — Assessment & Plan Note (Signed)
Chronic issue. Improved at this time. She'll continue to monitor.

## 2016-04-18 NOTE — Progress Notes (Signed)
Pre visit review using our clinic review tool, if applicable. No additional management support is needed unless otherwise documented below in the visit note. 

## 2016-04-18 NOTE — Assessment & Plan Note (Signed)
Suspect soft tissue, tendon, or muscular strain of the anterior aspect of her right ankle. She does not meet [place] criteria for x-ray of her right ankle. She does have swelling slightly greater in the right leg compared to the left though no risk factors for blood clot or history of blood clot. Wells score of -2 for DVT. Doubt DVT as cause of her swelling given the bilateral nature. Suspect right leg swelling possibly slightly worse related to ankle. Could also be venous insufficiency. Discussed icing the ankle and wearing a brace. She is hesitant to take any medication for this. She'll continue to monitor and if not improving she'll let us know.

## 2016-04-18 NOTE — Telephone Encounter (Signed)
Patient is coming in today for appointment.  

## 2016-04-18 NOTE — Assessment & Plan Note (Signed)
>>  ASSESSMENT AND PLAN FOR CONTROLLED DIABETES MELLITUS TYPE 2 WITH COMPLICATIONS (HCC) WRITTEN ON 04/18/2016  4:44 PM BY SONNENBERG, ERIC G, MD  Seems to be better controlled per home CBGs. We will check an A1c today. Continue current medications.

## 2016-04-18 NOTE — Progress Notes (Signed)
Danielle Rumps, MD Phone: [phone]  Danielle Park is a 66 y.o. female who presents today for follow-up.  Hypertension: Checking at home and typically runs between 104-153/51-66 mostly in the 662H and 476L systolically. Taking hydralazine, metoprolol, lisinopril, Lasix, amlodipine. No chest pain, shortness of breath. She does note chronic edema in her bilateral lower extremities right slightly greater than left that reduces at night and worsened throughout the day. No orthopnea or PND.  Diabetes: Checking her blood sugars. Typically running between 67 and 131. She is watching her diet more. Taking Lantus 37 units and glipizide. Not taking Jardiance. No polyuria or polydipsia. Has rare hypoglycemia that she describes as getting shaky and agitated and then she eats something and improves. Saw ophthalmology last month.  Right ankle pain: Patient notes this has been going on since Christmas. It is a lot better today. Feels like a possible sprain. Is over the anterior ankle. Notes a soreness. Does note her bilateral legs are swollen. She is not taking any medicines for this. Only bothers her when she walks. No history of blood clot. No recent travel. No history of cancer.  Thoracic back pain: Has been a chronic intermittent issue. About 3 weeks ago it was bothering her more on a daily basis for about a week though is not bothering her now. No radiation. No numbness or weakness.  PMH: nonsmoker.   ROS see history of present illness  Objective  Physical Exam Vitals:   04/18/16 1528 04/18/16 1557  BP: (!) 168/80 (!) 156/74  Pulse: 66   Temp: 98.2 F (36.8 C)     BP Readings from Last 3 Encounters:  04/18/16 (!) 156/74  02/21/16 136/68  01/17/16 (!) 148/82   Wt Readings from Last 3 Encounters:  04/18/16 196 lb (88.9 kg)  01/17/16 203 lb 6 oz (92.3 kg)  12/31/15 194 lb (88 kg)    Physical Exam  Constitutional: No distress.  Cardiovascular: Normal rate, regular rhythm and normal  heart sounds.   Pulmonary/Chest: Effort normal.  Musculoskeletal:  No midline spine tenderness, no midline spine step-off, minimal tenderness in the right thoracic paraspinous area just above her bra line, right ankle with mild swelling, no tenderness of the medial or lateral malleolus, fifth metatarsal, or navicular, there is mild tenderness over the anterior portion of the ankle with no warmth or erythema, left ankle with no tenderness or swelling, trace pitting edema bilateral legs to mid shin, no calf tenderness or cords, right calf 38 cm, left calf 36 cm  Neurological: She is alert. Gait normal.  Skin: Skin is warm and dry. She is not diaphoretic.     Assessment/Plan: Please see individual problem list.  Renovascular hypertension Above goal today. Her blood pressures at home seem relatively well controlled and her diastolics are actually on the low side. She does report some minimal lightheadedness at times that I suspect is due to her borderline low diastolics. I'm hesitant to add any blood pressure medication given these low diastolic blood pressures as we could cause her to get more lightheaded and potentially pass out. She is in agreement with this and she'll continue to monitor her blood pressures. Discussed if they are consistently greater than 140/90 that she let us know. She'll continue her current blood pressure medication regimen. She'll return in 1-2 weeks for recheck of her blood pressure.  Diabetes ([place]) Seems to be better controlled per home CBGs. We will check an A1c today. Continue current medications.  Thoracic back pain Chronic issue. Improved  at this time. She'll continue to monitor.  Right ankle pain Suspect soft tissue, tendon, or muscular strain of the anterior aspect of her right ankle. She does not meet [place] criteria for x-ray of her right ankle. She does have swelling slightly greater in the right leg compared to the left though no risk factors for blood clot or  history of blood clot. Wells score of -2 for DVT. Doubt DVT as cause of her swelling given the bilateral nature. Suspect right leg swelling possibly slightly worse related to ankle. Could also be venous insufficiency. Discussed icing the ankle and wearing a brace. She is hesitant to take any medication for this. She'll continue to monitor and if not improving she'll let us know.   Orders Placed This Encounter  Procedures  . TSH  . CBC  . Comp Met (CMET)  . Hemoglobin A1c    Danielle Rumps, MD [place]

## 2016-04-18 NOTE — Patient Instructions (Addendum)
Nice to see you. Please ice your right ankle. You can also buy a brace over-the-counter at the pharmacy to see if this will be beneficial. You should wear this to work. If the pain or swelling gets worse please let us know. Please continue to work on your diabetes and take her diabetic medication. We'll get some lab work today and contacted with the results.

## 2016-04-18 NOTE — Assessment & Plan Note (Signed)
Seems to be better controlled per home CBGs. We will check an A1c today. Continue current medications.

## 2016-04-18 NOTE — Assessment & Plan Note (Addendum)
Above goal today. Her blood pressures at home seem relatively well controlled and her diastolics are actually on the low side. She does report some minimal lightheadedness at times that I suspect is due to her borderline low diastolics. I'm hesitant to add any blood pressure medication given these low diastolic blood pressures as we could cause her to get more lightheaded and potentially pass out. She is in agreement with this and she'll continue to monitor her blood pressures. Discussed if they are consistently greater than 140/90 that she let us know. She'll continue her current blood pressure medication regimen. She'll return in 1-2 weeks for recheck of her blood pressure.

## 2016-04-19 LAB — CBC
HCT: 35.4 % — ABNORMAL LOW (ref 36.0–46.0)
Hemoglobin: 11.8 g/dL — ABNORMAL LOW (ref 12.0–15.0)
MCHC: 33.3 g/dL (ref 30.0–36.0)
MCV: 85.5 fl (ref 78.0–100.0)
PLATELETS: 235 10*3/uL (ref 150.0–400.0)
RBC: 4.14 Mil/uL (ref 3.87–5.11)
RDW: 14.3 % (ref 11.5–15.5)
WBC: 9 10*3/uL (ref 4.0–10.5)

## 2016-04-19 LAB — COMPREHENSIVE METABOLIC PANEL
ALT: 6 U/L (ref 0–35)
AST: 13 U/L (ref 0–37)
Albumin: 4.2 g/dL (ref 3.5–5.2)
Alkaline Phosphatase: 114 U/L (ref 39–117)
BILIRUBIN TOTAL: 0.4 mg/dL (ref 0.2–1.2)
BUN: 22 mg/dL (ref 6–23)
CALCIUM: 9.2 mg/dL (ref 8.4–10.5)
CHLORIDE: 104 meq/L (ref 96–112)
CO2: 26 meq/L (ref 19–32)
CREATININE: 1.16 mg/dL (ref 0.40–1.20)
GFR: 49.79 mL/min — ABNORMAL LOW (ref >60.00)
GLUCOSE: 85 mg/dL (ref 70–99)
Potassium: 4.4 mEq/L (ref 3.5–5.1)
SODIUM: 137 meq/L (ref 135–145)
Total Protein: 7.9 g/dL (ref 6.0–8.3)

## 2016-04-19 LAB — HEMOGLOBIN A1C: Hgb A1c MFr Bld: 7.2 % — ABNORMAL HIGH (ref 4.6–6.5)

## 2016-04-19 LAB — TSH: TSH: 3.8 u[IU]/mL (ref 0.35–4.50)

## 2016-04-20 ENCOUNTER — Telehealth: Payer: Self-pay | Admitting: Family Medicine

## 2016-04-20 NOTE — Telephone Encounter (Signed)
See other message

## 2016-04-20 NOTE — Telephone Encounter (Signed)
Pt called returning your call in regards to labs. Thank you!  Call pt @ [phone]

## 2016-05-03 ENCOUNTER — Encounter: Payer: Self-pay | Admitting: Family Medicine

## 2016-05-03 ENCOUNTER — Ambulatory Visit (INDEPENDENT_AMBULATORY_CARE_PROVIDER_SITE_OTHER): Payer: PPO | Admitting: Family Medicine

## 2016-05-03 DIAGNOSIS — Z1211 Encounter for screening for malignant neoplasm of colon: Secondary | ICD-10-CM | POA: Insufficient documentation

## 2016-05-03 DIAGNOSIS — Z0000 Encounter for general adult medical examination without abnormal findings: Secondary | ICD-10-CM

## 2016-05-03 MED ORDER — TETANUS-DIPHTH-ACELL PERTUSSIS 5-2.5-18.5 LF-MCG/0.5 IM SUSP: 0.5000 mL | Freq: Once | INTRAMUSCULAR | 0 refills | Status: AC

## 2016-05-03 MED ORDER — PNEUMOCOCCAL 13-VAL CONJ VACC IM SUSP: 0.5000 mL | INTRAMUSCULAR | 0 refills | Status: AC

## 2016-05-03 NOTE — Patient Instructions (Addendum)
  [name] , Thank you for taking time to come for your Medicare Wellness Visit. I appreciate your ongoing commitment to your health goals. Please review the following plan we discussed and let me know if I can assist you in the future.   Discussed increasing exercise outside of work and continuing to work on diet changes. She will monitor her BP as well.   This is a list of the screening recommended for you and due dates:  Health Maintenance  Topic Date Due  .  Hepatitis C: One time screening is recommended by Center for Disease Control  (CDC) for  adults born from [birth year] through 1965.   02-02-1951  . HIV Screening  01/07/1966  . Tetanus Vaccine  01/07/1970  . Shingles Vaccine  01/08/2011  . Mammogram  09/12/2013  . Complete foot exam   11/18/2014  . DEXA scan (bone density measurement)  01/08/2016  . Pneumonia vaccines (1 of 2 - PCV13) 01/08/2016  . Pap Smear  09/13/2018*  . Hemoglobin A1C  10/16/2016  . Eye exam for diabetics  02/20/2017  . Colon Cancer Screening  02/07/2019  . Flu Shot  Completed  *Topic was postponed. The date shown is not the original due date.

## 2016-05-03 NOTE — Assessment & Plan Note (Addendum)
Annual Medicare wellness exam was done.  During the course of the visit the patient was educated and counseled about appropriate screening and preventive services including: fall prevention, diabetes screening, nutrition counseling, colorectal cancer screening, and recommended immunizations.  Printed recommendations for health maintenance screenings was given. Patient deferred Pap smear and is unsure when she would like to schedule this. She deferred lab work as well to her follow-up in 3 months. She is given a prescription for tetanus vaccination and Prevnar. She deferred Zostavax. She additionally declined mammogram.

## 2016-05-03 NOTE — Progress Notes (Signed)
Pre visit review using our clinic review tool, if applicable. No additional management support is needed unless otherwise documented below in the visit note. 

## 2016-05-03 NOTE — Progress Notes (Signed)
Marikay Alar, MD Phone: [phone]  Danielle Park is a 66 y.o. female who presents today for Medicare wellness visit.  The patient is here for annual Medicare wellness examination.   The risk factors are reflected in the social history.  The roster of all physicians providing medical care to patient - is listed in the Snapshot section of the chart.  Activities of daily living:  The patient is 100% independent in all ADLs: dressing, toileting, feeding as well as independent mobility  Home safety : The patient has smoke detectors in the home. They wear seatbelts.  There are firearms at home and are locked. There is no violence in the home.   Patient meets age group risk for hepatitis C though no other risks for hepatitis, STDs or HIV. There is no history of blood transfusion. They have no travel history to infectious disease endemic areas of the world.  The patient has not seen their dentist in the last six month. They have seen their eye doctor in the last year. No hearing issues. They do not have excessive sun exposure. Discussed the need for sun protection: hats, long sleeves and use of sunscreen if there is significant sun exposure.   Diet: the importance of a healthy diet is discussed. They do have a healthy diet.  The benefits of regular aerobic exercise were discussed. Walks and stands for 8 hours. Did sprain ankle and PAD limited exercise in the past to some extent. Follows with her vascular surgeon for her PAD.  Depression screen: there are no signs or vegative symptoms of depression- irritability, change in appetite, anhedonia, sadness/tearfullness.  Cognitive assessment: the patient manages all their financial and personal affairs and is actively engaged. Recalled 3/3 objects at 3 minutes; performed clock-face test normally.  The following portions of the patient's history were reviewed and updated as appropriate: allergies, current medications, past family history, past  medical history,  past surgical history, past social history  and problem list.  Visual acuity was not assessed per patient preference given follow-up with optho. Hearing and body mass index were assessed and reviewed.   During the course of the visit the patient was educated and counseled about appropriate screening and preventive services including: fall prevention , diabetes screening, nutrition counseling, colorectal cancer screening, and recommended immunizations.    Brings blood pressure log in to review as well. Typically in the 140's/60's though occasionally down in to the 50s diastolically. Does have renovascular HTN and has a renal US scheduled for next month through vascular surgery.   Active Ambulatory Problems    Diagnosis Date Noted  . Diabetes (HCC) 09/13/2011  . Renal artery stenosis (HCC) 05/22/2012  . Blood in the urine 10/17/2012  . 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 Pain in limb 12/08/2015  . PVD (peripheral vascular disease) (HCC) 12/31/2015  . 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 Thoracic back pain 04/18/2016  . Right ankle pain 04/18/2016  . Initial Medicare annual wellness visit 05/03/2016   Resolved Ambulatory Problems    Diagnosis Date Noted  . Hypertension 09/13/2011  . Screening for colon cancer 09/13/2011  . Congenital single kidney 09/13/2011  . Screening for breast cancer 09/13/2011  . Screening for cervical cancer 09/13/2011  . Right thigh pain 05/15/2012  . Elevated blood pressure 05/21/2012  . Hypothyroidism 07/26/2012  . Proximal leg weakness 09/11/2012  . Dysuria 09/11/2012  . Obesity (BMI 30-39.9) 05/21/2014  . Pain in  joint, pelvic region and thigh 05/21/2014  . Ingrown right greater toenail 05/30/2014  . Right hip pain 08/19/2014  . Acute renal injury (HCC) 01/12/2015  . Aorto-iliac atherosclerosis (HCC) 01/12/2015  . DVT femoral (deep venous  thrombosis) with thrombophlebitis, left (HCC) 12/31/2015   Past Medical History:  Diagnosis Date  . Chicken pox   . Congenital doubling of uterus   . Congenital single kidney   . Diabetes mellitus   . Hypertension   . UTI (urinary tract infection)     Family History  Problem Relation Age of Onset  . Hypertension Mother   . Diabetes Mother   . Heart disease Mother     s/p CABG x 2  . Hypertension Father   . Cancer Father     lung   . Diabetes Sister   . Heart disease Sister   . Heart disease Sister   . Cancer - Lung Brother   . Cancer Sister     lung  . Heart disease Other   . Diabetes Other     Social History   Social History  . Marital status: Married    Spouse name: N/A  . Number of children: N/A  . Years of education: N/A   Occupational History  . Not on file.   Social History Main Topics  . Smoking status: Never Smoker  . Smokeless tobacco: Never Used  . Alcohol use No  . Drug use: No  . Sexual activity: Not on file   Other Topics Concern  . Not on file   Social History Narrative   Lives in [place] with husband. 1 dog      Work - Administrator, [employer]   Diet - regular diet   Exercise - occasional, very active at work    ROS no ROS as this is a medicare AWV  Objective  Physical Exam Vitals:   05/03/16 1502  BP: 140/66  Pulse: 66  Temp: 98 F (36.7 C)    BP Readings from Last 3 Encounters:  05/03/16 140/66  04/18/16 (!) 156/74  02/21/16 136/68   Wt Readings from Last 3 Encounters:  05/03/16 194 lb 12.8 oz (88.4 kg)  04/18/16 196 lb (88.9 kg)  01/17/16 203 lb 6 oz (92.3 kg)    Physical Exam no physical exam given wellness visit.   Assessment/Plan:   Initial Medicare annual wellness visit Annual Medicare wellness exam was done.  During the course of the visit the patient was educated and counseled about appropriate screening and preventive services including: fall prevention, diabetes screening, nutrition counseling, colorectal cancer  screening, and recommended immunizations.  Printed recommendations for health maintenance screenings was given. Patient deferred Pap smear and is unsure when she would like to schedule this. She deferred lab work as well to her follow-up in 3 months. She is given a prescription for tetanus vaccination and Prevnar. She deferred Zostavax. She additionally declined mammogram.    No orders of the defined types were placed in this encounter.   Meds ordered this encounter  Medications  . pneumococcal 13-valent conjugate vaccine (PREVNAR 13) SUSP injection    Sig: Inject 0.5 mLs into the muscle tomorrow at 10 am.    Dispense:  1 Syringe    Refill:  0  . Tdap (BOOSTRIX) 5-2.5-18.5 LF-MCG/0.5 injection    Sig: Inject 0.5 mLs into the muscle once.    Dispense:  0.5 mL    Refill:  0     Marikay AlarEric Marvia Troost, MD [facility]  [employer]

## 2016-06-01 ENCOUNTER — Other Ambulatory Visit: Payer: Self-pay | Admitting: Family Medicine

## 2016-06-30 ENCOUNTER — Ambulatory Visit (INDEPENDENT_AMBULATORY_CARE_PROVIDER_SITE_OTHER): Payer: PPO | Admitting: Vascular Surgery

## 2016-06-30 ENCOUNTER — Encounter (INDEPENDENT_AMBULATORY_CARE_PROVIDER_SITE_OTHER): Payer: PPO

## 2016-07-11 ENCOUNTER — Other Ambulatory Visit: Payer: Self-pay | Admitting: Family Medicine

## 2016-07-13 ENCOUNTER — Ambulatory Visit (INDEPENDENT_AMBULATORY_CARE_PROVIDER_SITE_OTHER): Payer: PPO

## 2016-07-13 ENCOUNTER — Encounter (INDEPENDENT_AMBULATORY_CARE_PROVIDER_SITE_OTHER): Payer: Self-pay | Admitting: Vascular Surgery

## 2016-07-13 ENCOUNTER — Ambulatory Visit (INDEPENDENT_AMBULATORY_CARE_PROVIDER_SITE_OTHER): Payer: PPO | Admitting: Vascular Surgery

## 2016-07-13 VITALS — BP 184/84 | HR 58 | Resp 16 | Ht 62.0 in | Wt 194.0 lb

## 2016-07-13 DIAGNOSIS — I150 Renovascular hypertension: Secondary | ICD-10-CM | POA: Diagnosis not present

## 2016-07-13 DIAGNOSIS — I701 Atherosclerosis of renal artery: Secondary | ICD-10-CM | POA: Diagnosis not present

## 2016-07-17 ENCOUNTER — Ambulatory Visit (INDEPENDENT_AMBULATORY_CARE_PROVIDER_SITE_OTHER): Payer: PPO

## 2016-07-17 ENCOUNTER — Encounter: Payer: Self-pay | Admitting: Family Medicine

## 2016-07-17 ENCOUNTER — Other Ambulatory Visit: Payer: Self-pay | Admitting: Family Medicine

## 2016-07-17 ENCOUNTER — Telehealth: Payer: Self-pay | Admitting: Family Medicine

## 2016-07-17 ENCOUNTER — Ambulatory Visit (INDEPENDENT_AMBULATORY_CARE_PROVIDER_SITE_OTHER): Payer: PPO | Admitting: Family Medicine

## 2016-07-17 VITALS — BP 160/62 | HR 72 | Temp 98.1°F | Resp 16

## 2016-07-17 DIAGNOSIS — D649 Anemia, unspecified: Secondary | ICD-10-CM

## 2016-07-17 DIAGNOSIS — M546 Pain in thoracic spine: Secondary | ICD-10-CM | POA: Diagnosis not present

## 2016-07-17 DIAGNOSIS — G8929 Other chronic pain: Secondary | ICD-10-CM

## 2016-07-17 DIAGNOSIS — K760 Fatty (change of) liver, not elsewhere classified: Secondary | ICD-10-CM

## 2016-07-17 DIAGNOSIS — R748 Abnormal levels of other serum enzymes: Secondary | ICD-10-CM | POA: Diagnosis not present

## 2016-07-17 MED ORDER — BACLOFEN 10 MG PO TABS: 10.0000 mg | ORAL_TABLET | Freq: Three times a day (TID) | ORAL | 0 refills | Status: DC

## 2016-07-17 NOTE — Progress Notes (Signed)
Tommi Rumps, MD Phone: [phone]  Danielle Park is a 66 y.o. female who presents today for same-day visit.  Patient notes over the last week her left thoracic back just medial to her left scapula has been bothering her. Notes this starts out as a burning sensation then turns into a spasm. Bothers her most if she is doing specific movements or if she is doing the same movement for a prolonged period of time like at work. Notes if she stops doing that movement it will ease off. Notes it is a dull pain most of the time. Notes no radiation. No numbness or weakness. No chest pain or shortness of breath. No fevers. Has taken a pain pill that she found at home for this previously. Has been bothering her more frequently over the last year or so.  Did discuss her fatty liver with her and having her evaluated by GI for further evaluation and workup. She has been avoiding Tylenol. She wants to await seeing them until after her back discomfort is improving. I advised her to place a referral.  PMH: nonsmoker.   ROS see history of present illness  Objective  Physical Exam Vitals:   07/17/16 1656 07/17/16 1657  BP: (!) 160/60 (!) 160/62  Pulse:    Resp:    Temp:      BP Readings from Last 3 Encounters:  07/17/16 (!) 160/62  07/13/16 (!) 184/84  05/03/16 140/66   Wt Readings from Last 3 Encounters:  07/13/16 194 lb (88 kg)  05/03/16 194 lb 12.8 oz (88.4 kg)  04/18/16 196 lb (88.9 kg)    Physical Exam  Constitutional: No distress.  Cardiovascular: Normal rate, regular rhythm and normal heart sounds.   2+ radial pulses.  Pulmonary/Chest: Effort normal and breath sounds normal.  Musculoskeletal:  No midline spine tenderness, no midline spine step-off, there is left possibly rhomboid or paraspinous muscular tenderness just medial to the scapula in a pinpoint area, no other muscular back tenderness, discomfort worsened by specific movements on strength exam  Neurological: She is alert.  Gait normal.  5/5 strength in bilateral biceps, triceps, grip, quads, hamstrings, plantar and dorsiflexion, sensation to light touch intact in bilateral UE and LE  Skin: Skin is warm and dry. She is not diaphoretic.     Assessment/Plan: Please see individual problem list.  Thoracic back pain Patient with acute exacerbation of her chronic thoracic back pain.  symptoms most consistent with musculoskeletal cause. no other symptoms with this. Exam indicates musculoskeletal causes well. We'll treat with muscle relaxer. Advised this could make her drowsy. She can use heat. Rare Tylenol use given her fatty liver disease. Given chronicity we will obtain an x-ray of her thoracic spine. Given return precautions.   Fatty liver Discussed referral to GI given fatty liver seen on ultrasound. Referral will be placed. We will recheck labs as well.   Orders Placed This Encounter  Procedures  . DG Thoracic Spine 4V    Standing Status:   Future    Number of Occurrences:   1    Standing Expiration Date:   09/16/2017    Order Specific Question:   Reason for Exam (SYMPTOM  OR DIAGNOSIS REQUIRED)    Answer:   thoracic back pain medial to left scaupla    Order Specific Question:   Preferred imaging location?    Answer:   [facility] Specific Question:   Radiology Contrast Protocol - do NOT remove file path  Answer:   \\[email or web address].pdf  . Comp Met (CMET)  . Gamma GT  . CBC  . Ambulatory referral to Gastroenterology    Referral Priority:   Routine    Referral Type:   Consultation    Referral Reason:   Specialty Services Required    Number of Visits Requested:   1    Meds ordered this encounter  Medications  . baclofen (LIORESAL) 10 MG tablet    Sig: Take 1 tablet (10 mg total) by mouth 3 (three) times daily.    Dispense:  30 each    Refill:  0    Tommi Rumps, MD [place]

## 2016-07-17 NOTE — Patient Instructions (Signed)
Nice to see you You've likely strained a muscle in your back. We will x-ray your spine and start you on a muscle relaxer. If you develop worsening pain or you develop numbness, weakness, chest pain, shortness breath, fevers, or any new or change in symptoms please seek medical attention immediately.

## 2016-07-17 NOTE — Telephone Encounter (Signed)
Patient came in with spasm type pain with burning sensation under left scapula. Spoke with PCP was advised to add to schedule.

## 2016-07-17 NOTE — Telephone Encounter (Signed)
Last OV 05/03/16 last filled by Dr.Walker 09/24/2015

## 2016-07-17 NOTE — Progress Notes (Signed)
Pre-visit discussion using our clinic review tool. No additional management support is needed unless otherwise documented below in the visit note.  

## 2016-07-17 NOTE — Assessment & Plan Note (Signed)
Patient with acute exacerbation of her chronic thoracic back pain.  symptoms most consistent with musculoskeletal cause. no other symptoms with this. Exam indicates musculoskeletal causes well. We'll treat with muscle relaxer. Advised this could make her drowsy. She can use heat. Rare Tylenol use given her fatty liver disease. Given chronicity we will obtain an x-ray of her thoracic spine. Given return precautions.

## 2016-07-17 NOTE — Assessment & Plan Note (Addendum)
Discussed referral to GI given fatty liver seen on ultrasound. Referral will be placed. We will recheck labs as well.

## 2016-07-18 ENCOUNTER — Other Ambulatory Visit: Payer: Self-pay

## 2016-07-18 LAB — COMPREHENSIVE METABOLIC PANEL
ALBUMIN: 4.4 g/dL (ref 3.5–5.2)
ALK PHOS: 140 U/L — AB (ref 39–117)
ALT: 7 U/L (ref 0–35)
AST: 14 U/L (ref 0–37)
BILIRUBIN TOTAL: 0.5 mg/dL (ref 0.2–1.2)
BUN: 15 mg/dL (ref 6–23)
CO2: 28 mEq/L (ref 19–32)
Calcium: 9.8 mg/dL (ref 8.4–10.5)
Chloride: 101 mEq/L (ref 96–112)
Creatinine, Ser: 1.04 mg/dL (ref 0.40–1.20)
GFR: 56.43 mL/min — AB (ref >60.00)
Glucose, Bld: 253 mg/dL — ABNORMAL HIGH (ref 70–99)
Potassium: 4.4 mEq/L (ref 3.5–5.1)
SODIUM: 136 meq/L (ref 135–145)
TOTAL PROTEIN: 7.8 g/dL (ref 6.0–8.3)

## 2016-07-18 LAB — CBC
HCT: 40.4 % (ref 36.0–46.0)
Hemoglobin: 13.2 g/dL (ref 12.0–15.0)
MCHC: 32.7 g/dL (ref 30.0–36.0)
MCV: 86.4 fl (ref 78.0–100.0)
Platelets: 231 10*3/uL (ref 150.0–400.0)
RBC: 4.68 Mil/uL (ref 3.87–5.11)
RDW: 14.1 % (ref 11.5–15.5)
WBC: 8.4 10*3/uL (ref 4.0–10.5)

## 2016-07-18 LAB — GAMMA GT: GGT: 12 U/L (ref 7–51)

## 2016-07-18 MED ORDER — CLOPIDOGREL BISULFATE 75 MG PO TABS: 75.0000 mg | ORAL_TABLET | Freq: Every day | ORAL | 11 refills | Status: DC

## 2016-07-18 NOTE — Telephone Encounter (Signed)
Last OV 07/17/16 last filled by Dr.Walker 09/24/15 30 11  rf

## 2016-07-24 ENCOUNTER — Telehealth: Payer: Self-pay

## 2016-07-24 DIAGNOSIS — R748 Abnormal levels of other serum enzymes: Secondary | ICD-10-CM

## 2016-07-24 NOTE — Telephone Encounter (Signed)
Pt requested a call at [phone]

## 2016-07-24 NOTE — Telephone Encounter (Signed)
-----   Message from Glori Luis, MD sent at 07/21/2016  6:25 PM EDT ----- Please let the patient know that her alkaline phosphatase is elevated slightly again. We did a lab test this time that may indicate this is not from her liver. I typically refer people to endocrinology for evaluation when this occurs. If she is willing I can place a referral. Her other lab work revealed an elevated blood sugar though was otherwise stable. Thanks.

## 2016-07-24 NOTE — Telephone Encounter (Signed)
Left message to return call 

## 2016-07-24 NOTE — Telephone Encounter (Signed)
-----   Message from Glori Luis, MD sent at 07/22/2016  4:23 PM EDT ----- Please let the patient know that her x-ray did not reveal any acute findings. It did reveal findings consistent with DISH which is a type of issue where the ligaments and tendons can become calcified. This could be contributing to her discomfort. I would like her to see orthopedics or sports medicine for further evaluation of this. Thanks.

## 2016-07-25 NOTE — Telephone Encounter (Signed)
Left voice mail to call back 

## 2016-07-25 NOTE — Telephone Encounter (Signed)
Referral placed.

## 2016-07-25 NOTE — Telephone Encounter (Signed)
fyi

## 2016-07-25 NOTE — Telephone Encounter (Signed)
Patient is aware of Dr.Sonnenberg's statement, she did understand and agreed to have a referral placed for endocrinology.   Pt contact [phone]

## 2016-08-09 ENCOUNTER — Ambulatory Visit (INDEPENDENT_AMBULATORY_CARE_PROVIDER_SITE_OTHER): Payer: PPO | Admitting: Family Medicine

## 2016-08-09 ENCOUNTER — Encounter: Payer: Self-pay | Admitting: Family Medicine

## 2016-08-09 VITALS — BP 150/64 | HR 68 | Temp 98.1°F | Wt 199.2 lb

## 2016-08-09 DIAGNOSIS — N183 Chronic kidney disease, stage 3 unspecified: Secondary | ICD-10-CM

## 2016-08-09 DIAGNOSIS — R748 Abnormal levels of other serum enzymes: Secondary | ICD-10-CM | POA: Diagnosis not present

## 2016-08-09 DIAGNOSIS — Z794 Long term (current) use of insulin: Secondary | ICD-10-CM

## 2016-08-09 DIAGNOSIS — I150 Renovascular hypertension: Secondary | ICD-10-CM

## 2016-08-09 DIAGNOSIS — K760 Fatty (change of) liver, not elsewhere classified: Secondary | ICD-10-CM

## 2016-08-09 DIAGNOSIS — E1322 Other specified diabetes mellitus with diabetic chronic kidney disease: Secondary | ICD-10-CM

## 2016-08-09 NOTE — Assessment & Plan Note (Signed)
Recently well-controlled. Continue current medications. Check A1c today.

## 2016-08-09 NOTE — Assessment & Plan Note (Signed)
Blood pressure fairly well controlled at home compared to prior. No significant lightheadedness. Her diastolics really do limit how much we can treat her systolic blood pressures. She'll continue her current regimen.

## 2016-08-09 NOTE — Patient Instructions (Addendum)
Nice to see you. Please continue to monitor your blood pressure. We'll check lab work and contact you with the results.

## 2016-08-09 NOTE — Assessment & Plan Note (Signed)
Negative GGT. Elevated alkaline phosphatase going back for some time. She reports she has an appointment with endocrinology for further evaluation.

## 2016-08-09 NOTE — Assessment & Plan Note (Signed)
She is due to see GI later this month.

## 2016-08-09 NOTE — Assessment & Plan Note (Signed)
>>  ASSESSMENT AND PLAN FOR CONTROLLED DIABETES MELLITUS TYPE 2 WITH COMPLICATIONS (HCC) WRITTEN ON 08/09/2016  4:06 PM BY SONNENBERG, ERIC G, MD  Recently well-controlled. Continue current medications. Check A1c today.

## 2016-08-09 NOTE — Progress Notes (Signed)
  Danielle Park Danielle Fahrney, MD Phone: [phone]  Danielle SaugerBobbie Park is a 66 y.o. female who presents today for follow-up.  Hypertension: Typically running in the 130-140s/50s-60s at home. Rarely is higher or lower. Taking amlodipine, Lasix, hydralazine, lisinopril, and metoprolol. No chest pain or shortness of breath. Her swelling is improved. No orthopnea or PND. Next  Diabetes: Typically runs between 91 and 167. Taking glipizide and Lantus. No polyuria or polydipsia. One single episode of hypoglycemia that occurred after not eating. She ate and it resolved. She saw ophthalmology in March.  She is going to see GI for fatty liver. On 2 or 3 occasions she had a faint discomfort in the right upper quadrant when she twisted wrong. Resolved when she straightened up. No other symptoms with this. No abdominal pain now. She rarely takes Tylenol. She is additionally going to see endocrinology given a persistently elevated alkaline phosphatase level in the setting of a normal GGT.  PMH: nonsmoker.   ROS see history of present illness  Objective  Physical Exam Vitals:   08/09/16 1530  BP: (!) 150/64  Pulse: 68  Temp: 98.1 F (36.7 C)    BP Readings from Last 3 Encounters:  08/09/16 (!) 150/64  07/17/16 (!) 160/62  07/13/16 (!) 184/84   Wt Readings from Last 3 Encounters:  08/09/16 199 lb 3.2 oz (90.4 kg)  07/13/16 194 lb (88 kg)  05/03/16 194 lb 12.8 oz (88.4 kg)    Physical Exam  Constitutional: No distress.  Cardiovascular: Normal rate, regular rhythm and normal heart sounds.   Pulmonary/Chest: Effort normal and breath sounds normal.  Abdominal: Soft. Bowel sounds are normal. She exhibits no distension. There is no tenderness. There is no rebound and no guarding.  Musculoskeletal: She exhibits no edema.  Neurological: She is alert. Gait normal.  Skin: Skin is warm and dry. She is not diaphoretic.     Assessment/Plan: Please see individual problem list.  Renovascular hypertension Blood  pressure fairly well controlled at home compared to prior. No significant lightheadedness. Her diastolics really do limit how much we can treat her systolic blood pressures. She'll continue her current regimen.  Fatty liver She is due to see GI later this month.  Diabetes (HCC) Recently well-controlled. Continue current medications. Check A1c today.  Elevated alkaline phosphatase level Negative GGT. Elevated alkaline phosphatase going back for some time. She reports she has an appointment with endocrinology for further evaluation.   Orders Placed This Encounter  Procedures  . HgB A1c  . Direct LDL    Danielle Park Danielle Vallin, MD [facility] [place]

## 2016-08-10 LAB — HEMOGLOBIN A1C: HEMOGLOBIN A1C: 8.1 % — AB (ref 4.6–6.5)

## 2016-08-10 LAB — LDL CHOLESTEROL, DIRECT: LDL DIRECT: 64 mg/dL

## 2016-08-17 ENCOUNTER — Encounter: Payer: Self-pay | Admitting: Gastroenterology

## 2016-08-17 ENCOUNTER — Other Ambulatory Visit: Payer: Self-pay

## 2016-08-17 ENCOUNTER — Ambulatory Visit (INDEPENDENT_AMBULATORY_CARE_PROVIDER_SITE_OTHER): Payer: PPO | Admitting: Gastroenterology

## 2016-08-17 VITALS — BP 156/69 | HR 66 | Temp 97.8°F | Ht 62.0 in | Wt 198.5 lb

## 2016-08-17 DIAGNOSIS — K760 Fatty (change of) liver, not elsewhere classified: Secondary | ICD-10-CM

## 2016-08-17 NOTE — Progress Notes (Signed)
Gastroenterology Consultation  Referring Provider:     Leone Haven, MD Primary Care Physician:  Danielle Haven, MD Primary Gastroenterologist:  Dr. Allen Norris     Reason for Consultation:     Fatty liver        HPI:   Danielle Park is a 66 y.o. y/o female referred for consultation & management of Fatty liver by Dr. Caryl Park, Angela Adam, MD.  This patient comes today with a ultrasound showing fatty liver.  The patient has had liver enzymes checked and the only thing that has been elevated is her alkaline phosphatase.  The patient also had a GGT that was normal.  She reports that she had not noted she had fatty liver and so she had switched from Dr. Gilford Park to Dr. Caryl Park.  She has no abdominal pain nausea vomiting fevers or chills.  The patient denies any alcohol abuse but does state that she has diabetes and her A1c has been elevated.  The patient has had increased cholesterol past but is now reporting that her cholesterol is under control.  Past Medical History:  Diagnosis Date  . Chicken pox   . 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 Congenital single kidney   . Diabetes mellitus   . Hypertension   . UTI (urinary tract infection)     Past Surgical History:  Procedure Laterality Date  . CESAREAN SECTION    . PERIPHERAL VASCULAR CATHETERIZATION N/A 01/11/2015   Procedure: Abdominal Aortogram w/Lower Extremity;  Surgeon: Algernon Huxley, MD;  Location: [place] [facility];  Service: Cardiovascular;  Laterality: N/A;  . PERIPHERAL VASCULAR CATHETERIZATION  01/11/2015   Procedure: Lower Extremity Intervention;  Surgeon: Algernon Huxley, MD;  Location: [facility];  Service: Cardiovascular;;  . RENAL ARTERY STENT  2014   Dr. Lucky Park at [facility]  . VAGINAL DELIVERY      Prior to Admission medications   Medication Sig Start Date End Date Taking? Authorizing Provider  amLODipine (NORVASC) 10 MG tablet Take 1 tablet (10 mg total) by mouth  daily. 02/16/16  Yes Danielle Haven, MD  aspirin 81 MG tablet Take 81 mg by mouth daily.   Yes [provider]  baclofen (LIORESAL) 10 MG tablet Take 1 tablet (10 mg total) by mouth 3 (three) times daily. 07/17/16  Yes Danielle Haven, MD  clopidogrel (PLAVIX) 75 MG tablet Take 1 tablet (75 mg total) by mouth daily. 07/18/16  Yes Danielle Haven, MD  furosemide (LASIX) 20 MG tablet Take 1 tablet (20 mg total) by mouth daily. 09/24/15  Yes Jackolyn Confer, MD  glipiZIDE (GLUCOTROL XL) 10 MG 24 hr tablet TAKE ONE TABLET BY MOUTH ONCE DAILY 09/24/15  Yes Jackolyn Confer, MD  hydrALAZINE (APRESOLINE) 25 MG tablet TAKE 1 TABLET (25 MG TOTAL) BY MOUTH 3 (THREE) TIMES DAILY. 07/11/16  Yes Danielle Haven, MD  Insulin Glargine (LANTUS SOLOSTAR) 100 UNIT/ML Solostar Pen Inject 35 Units into the skin daily at 10 pm. Patient taking differently: Inject 37 Units into the skin daily at 10 pm.  03/13/16  Yes Danielle Haven, MD  levothyroxine (SYNTHROID, LEVOTHROID) 75 MCG tablet TAKE ONE TABLET BY MOUTH ONCE DAILY BEFORE BREAKFAST 03/01/16  Yes Danielle Haven, MD  levothyroxine (SYNTHROID, LEVOTHROID) 75 MCG tablet TAKE ONE TABLET BY MOUTH ONCE DAILY BEFORE BREAKFAST 06/01/16  Yes Danielle Haven, MD  lisinopril (PRINIVIL,ZESTRIL) 40 MG tablet Take  1 tablet (40 mg total) by mouth daily. 09/24/15  Yes Jackolyn Confer, MD  metoprolol tartrate (LOPRESSOR) 25 MG tablet TAKE 1 TABLET (25 MG TOTAL) BY MOUTH 2 (TWO) TIMES DAILY. 07/17/16  Yes Danielle Haven, MD  NOVOFINE 32G X 6 MM MISC USE AS DIRECTED WITH LEVEMIR PEN 09/03/15  Yes Jackolyn Confer, MD  rosuvastatin (CRESTOR) 10 MG tablet Take 1 tablet (10 mg total) by mouth daily. 01/17/16  Yes Danielle Haven, MD    Family History  Problem Relation Age of Onset  . Hypertension Mother   . Diabetes Mother   . Heart disease Mother        s/p CABG x 2  . Hypertension Father   . Cancer Father        lung   . Diabetes Sister     . Heart disease Sister   . Heart disease Sister   . Cancer - Lung Brother   . Cancer Sister        lung  . Heart disease Other   . Diabetes Other      Social History  Substance Use Topics  . Smoking status: Never Smoker  . Smokeless tobacco: Never Used  . Alcohol use No    Allergies as of 08/17/2016  . (No Known Allergies)    Review of Systems:    All systems reviewed and negative except where noted in HPI.   Physical Exam:  BP (!) 156/69   Pulse 66   Temp 97.8 F (36.6 C) (Oral)   Ht 5' 2"  (1.575 m)   Wt 198 lb 8 oz (90 kg)   BMI 36.31 kg/m  No LMP recorded. Patient is postmenopausal. Psych:  Alert and cooperative. Normal mood and affect. General:   Alert,  Well-developed, well-nourished, pleasant and cooperative in NAD Head:  Normocephalic and atraumatic. Eyes:  Sclera clear, no icterus.   Conjunctiva pink. Ears:  Normal auditory acuity. Nose:  No deformity, discharge, or lesions. Mouth:  No deformity or lesions,oropharynx pink & moist. Neck:  Supple; no masses or thyromegaly. Lungs:  Respirations even and unlabored.  Clear throughout to auscultation.   No wheezes, crackles, or rhonchi. No acute distress. Heart:  Regular rate and rhythm; no murmurs, clicks, rubs, or gallops. Abdomen:  Normal bowel sounds.  No bruits.  Soft, non-tender and non-distended without masses, hepatosplenomegaly or hernias noted.  No guarding or rebound tenderness.  Negative Carnett sign.   Rectal:  Deferred.  Msk:  Symmetrical without gross deformities.  Good, equal movement & strength bilaterally. Pulses:  Normal pulses noted. Extremities:  No clubbing or edema.  No cyanosis. Neurologic:  Alert and oriented x3;  grossly normal neurologically. Skin:  Intact without significant lesions or rashes.  No jaundice. Lymph Nodes:  No significant cervical adenopathy. Psych:  Alert and cooperative. Normal mood and affect.  Imaging Studies: No results found.  Assessment and Plan:   Danielle Park is a 66 y.o. y/o female with fatty liver and normal enzymes except for alkaline phosphatase. The patient was found to have a normal GGT.  The patient has been told that she should try to control her diabetes better and that she should try to lose weight. Life-style modification to include routine physical activity of at least 20-30 minutes daily as well as dietary modification was discussed. Weight loss of 5-10% may improve nonalcoholic steatohepatisis or even hepatic fibrosis. Both diet and exercise are the foundation of treatment for patients with NAFLD. The patient will  have her liver enzymes checked again and she will have her alkaline phosphatase fractionated to make sure that the majority of the alk phosphatase is from the liver in light of her normal GGT.    Lucilla Lame, MD. [facility]   Note: This dictation was prepared with Dragon dictation along with smaller phrase technology. Any transcriptional errors that result from this process are unintentional.

## 2016-08-21 LAB — ALKALINE PHOSPHATASE, ISOENZYMES
BONE FRACTION: 51 % (ref 14–68)
INTESTINAL FRAC.: 11 % (ref 0–18)
LIVER FRACTION: 38 % (ref 18–85)

## 2016-08-21 LAB — HEPATIC FUNCTION PANEL
ALK PHOS: 144 IU/L — AB (ref 39–117)
ALT: 9 IU/L (ref 0–32)
AST: 18 IU/L (ref 0–40)
Albumin: 4.4 g/dL (ref 3.6–4.8)
BILIRUBIN TOTAL: 0.3 mg/dL (ref 0.0–1.2)
BILIRUBIN, DIRECT: 0.08 mg/dL (ref 0.00–0.40)
TOTAL PROTEIN: 7.7 g/dL (ref 6.0–8.5)

## 2016-09-18 ENCOUNTER — Other Ambulatory Visit: Payer: Self-pay

## 2016-09-18 DIAGNOSIS — E1122 Type 2 diabetes mellitus with diabetic chronic kidney disease: Secondary | ICD-10-CM | POA: Diagnosis not present

## 2016-09-18 DIAGNOSIS — Z794 Long term (current) use of insulin: Secondary | ICD-10-CM | POA: Diagnosis not present

## 2016-09-18 DIAGNOSIS — E669 Obesity, unspecified: Secondary | ICD-10-CM | POA: Diagnosis not present

## 2016-09-18 DIAGNOSIS — E1159 Type 2 diabetes mellitus with other circulatory complications: Secondary | ICD-10-CM | POA: Diagnosis not present

## 2016-09-18 DIAGNOSIS — N183 Chronic kidney disease, stage 3 (moderate): Secondary | ICD-10-CM | POA: Diagnosis not present

## 2016-09-18 MED ORDER — LISINOPRIL 40 MG PO TABS: 40.0000 mg | ORAL_TABLET | Freq: Every day | ORAL | 3 refills | Status: DC

## 2016-09-18 NOTE — Telephone Encounter (Signed)
Last filled by Dr.Walker 09/24/15 90 3rf last OV 08/09/16

## 2016-09-18 NOTE — Progress Notes (Signed)
Subjective:    Patient ID: Danielle SaugerBobbie Panning, female    DOB: March 15, 1951, 66 y.o.   MRN: [id number] Chief Complaint  Patient presents with  . Re-evaluation    Ultrasound follow up    Patient presents for renal artery stenosis 6 month follow-up. The patient is without complaint. Patient states no issues with his hypertension. He is unaware of any change in his renal function status. The patient underwent a bilateral renal artery duplex exam which was notable for > 60% stenosis (low end) of the right renal artery, patent right renal vein and left absent kidney. When compared to the previous exam on 12/31/2015, there has been no significant change. Patient denies any fever, nausea or vomiting.    Review of Systems  Constitutional: Negative.   HENT: Negative.   Eyes: Negative.   Respiratory: Negative.   Cardiovascular: Negative.   Gastrointestinal: Negative.   Endocrine: Negative.   Genitourinary: Negative.   Musculoskeletal: Negative.   Skin: Negative.   Allergic/Immunologic: Negative.   Neurological: Negative.   Hematological: Negative.   Psychiatric/Behavioral: Negative.       Objective:   Physical Exam  Constitutional: She is oriented to person, place, and time. She appears well-developed and well-nourished. No distress.  HENT:  Head: Normocephalic and atraumatic.  Eyes: Conjunctivae are normal. Pupils are equal, round, and reactive to light.  Neck: Normal range of motion.  Cardiovascular: Normal rate, regular rhythm, normal heart sounds and intact distal pulses.   Pulses:      Radial pulses are 2+ on the right side, and 2+ on the left side.  Pulmonary/Chest: Effort normal.  Musculoskeletal: Normal range of motion. She exhibits no edema.  Neurological: She is alert and oriented to person, place, and time.  Skin: Skin is warm and dry. She is not diaphoretic.  Psychiatric: She has a normal mood and affect. Her behavior is normal. Judgment and thought content normal.  Vitals  reviewed.   BP (!) 184/84 (BP Location: Left Arm)   Pulse (!) 58   Resp 16   Ht 5\' 2"  (1.575 m)   Wt 194 lb (88 kg)   BMI 35.48 kg/m   Past Medical History:  Diagnosis Date  . Chicken pox   . 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 Congenital single kidney   . Diabetes mellitus   . Hypertension   . UTI (urinary tract infection)     Social History   Social History  . Marital status: Married    Spouse name: N/A  . Number of children: N/A  . Years of education: N/A   Occupational History  . Not on file.   Social History Main Topics  . Smoking status: Never Smoker  . Smokeless tobacco: Never Used  . Alcohol use No  . Drug use: No  . Sexual activity: Not on file   Other Topics Concern  . Not on file   Social History Narrative   [place]. 1 dog      Work - Administrator, [employer]   Diet - regular diet   Exercise - occasional, very active at work    Past Surgical History:  Procedure Laterality Date  . CESAREAN SECTION    . PERIPHERAL VASCULAR CATHETERIZATION N/A 01/11/2015   Procedure: Abdominal Aortogram w/Lower Extremity;  Surgeon: Annice NeedyJason S Dew, MD;  Location: [facility] INVASIVE CV LAB;  Service: Cardiovascular;  Laterality: N/A;  . PERIPHERAL VASCULAR CATHETERIZATION  01/11/2015   Procedure:  Lower Extremity Intervention;  Surgeon: Annice Needy, MD;  Location: [facility] INVASIVE CV LAB;  Service: Cardiovascular;;  . RENAL ARTERY STENT  2014   Dr. Wyn Quaker at [facility]  . VAGINAL DELIVERY      Family History  Problem Relation Age of Onset  . Hypertension Mother   . Diabetes Mother   . Heart disease Mother        s/p CABG x 2  . Hypertension Father   . Cancer Father        lung   . Diabetes Sister   . Heart disease Sister   . Heart disease Sister   . Cancer - Lung Brother   . Cancer Sister        lung  . Heart disease Other   . Diabetes Other     No Known Allergies     Assessment & Plan:  Patient presents  for renal artery stenosis 6 month follow-up. The patient is without complaint. Patient states no issues with his hypertension. He is unaware of any change in his renal function status. The patient underwent a bilateral renal artery duplex exam which was notable for > 60% stenosis (low end) of the right renal artery, patent right renal vein and left absent kidney. When compared to the previous exam on 12/31/2015, there has been no significant change. Patient denies any fever, nausea or vomiting.  1. Renal artery stenosis (HCC) - stable Patient with elevated blood pressure during this visit however he states that he does not normally run high. The patient will call our office if his blood pressure continues to be elevated or if there is a change in his kidney function We will continue to surveil with a renal duplex and will see the patient back in six months. Patient to remain abstinent of tobacco use. I have discussed with the patient at length the risk factors for and pathogenesis of atherosclerotic disease and encouraged a healthy diet, regular exercise regimen and blood pressure / glucose control.  Patient was instructed to contact our office in the interim with problems such as increasing / uncontrollable hypertension or changes in kidney function. The patient expresses their understanding.  - VAS US RENAL ARTERY DUPLEX; Future  2. Renovascular hypertension - stable Patient's blood pressure is elevated this visit however he is adamant that his blood pressure does not run this high.  Current Outpatient Prescriptions on File Prior to Visit  Medication Sig Dispense Refill  . amLODipine (NORVASC) 10 MG tablet Take 1 tablet (10 mg total) by mouth daily. 90 tablet 1  . aspirin 81 MG tablet Take 81 mg by mouth daily.    . furosemide (LASIX) 20 MG tablet Take 1 tablet (20 mg total) by mouth daily. 30 tablet 11  . glipiZIDE (GLUCOTROL XL) 10 MG 24 hr tablet TAKE ONE TABLET BY MOUTH ONCE DAILY 90  tablet 3  . hydrALAZINE (APRESOLINE) 25 MG tablet TAKE 1 TABLET (25 MG TOTAL) BY MOUTH 3 (THREE) TIMES DAILY. 270 tablet 1  . Insulin Glargine (LANTUS SOLOSTAR) 100 UNIT/ML Solostar Pen Inject 35 Units into the skin daily at 10 pm. (Patient taking differently: Inject 37 Units into the skin daily at 10 pm. ) 5 pen 5  . levothyroxine (SYNTHROID, LEVOTHROID) 75 MCG tablet TAKE ONE TABLET BY MOUTH ONCE DAILY BEFORE BREAKFAST 90 tablet 1  . levothyroxine (SYNTHROID, LEVOTHROID) 75 MCG tablet TAKE ONE TABLET BY MOUTH ONCE DAILY BEFORE BREAKFAST 90 tablet 0  . lisinopril (PRINIVIL,ZESTRIL)  40 MG tablet Take 1 tablet (40 mg total) by mouth daily. 90 tablet 3  . NOVOFINE 32G X 6 MM MISC USE AS DIRECTED WITH LEVEMIR PEN 100 each 3  . rosuvastatin (CRESTOR) 10 MG tablet Take 1 tablet (10 mg total) by mouth daily. 90 tablet 3   No current facility-administered medications on file prior to visit.     There are no Patient Instructions on file for this visit. No Follow-up on file.   Alexi Geibel A Chaka Jefferys, PA-C

## 2016-09-25 ENCOUNTER — Other Ambulatory Visit: Payer: Self-pay

## 2016-09-25 MED ORDER — GLIPIZIDE ER 10 MG PO TB24: ORAL_TABLET | ORAL | 3 refills | Status: DC

## 2016-09-25 NOTE — Telephone Encounter (Signed)
Last OV 08/09/16 last filled by DR.Walker 09/24/15 90 3rf

## 2016-10-06 ENCOUNTER — Other Ambulatory Visit: Payer: Self-pay

## 2016-10-06 MED ORDER — INSULIN PEN NEEDLE 32G X 6 MM MISC: 3 refills | Status: DC

## 2016-10-06 MED ORDER — FUROSEMIDE 20 MG PO TABS: 20.0000 mg | ORAL_TABLET | Freq: Every day | ORAL | 3 refills | Status: DC

## 2016-11-03 ENCOUNTER — Other Ambulatory Visit: Payer: Self-pay | Admitting: Family Medicine

## 2016-11-03 DIAGNOSIS — E1165 Type 2 diabetes mellitus with hyperglycemia: Principal | ICD-10-CM

## 2016-11-03 DIAGNOSIS — E1122 Type 2 diabetes mellitus with diabetic chronic kidney disease: Secondary | ICD-10-CM

## 2016-11-03 DIAGNOSIS — Z794 Long term (current) use of insulin: Principal | ICD-10-CM

## 2016-11-03 DIAGNOSIS — IMO0002 Reserved for concepts with insufficient information to code with codable children: Secondary | ICD-10-CM

## 2016-11-03 DIAGNOSIS — N183 Chronic kidney disease, stage 3 (moderate): Principal | ICD-10-CM

## 2016-11-10 ENCOUNTER — Encounter: Payer: Self-pay | Admitting: Family Medicine

## 2016-11-10 ENCOUNTER — Ambulatory Visit (INDEPENDENT_AMBULATORY_CARE_PROVIDER_SITE_OTHER): Payer: PPO | Admitting: Family Medicine

## 2016-11-10 VITALS — BP 150/78 | HR 63 | Temp 98.4°F | Wt 195.8 lb

## 2016-11-10 DIAGNOSIS — I150 Renovascular hypertension: Secondary | ICD-10-CM

## 2016-11-10 DIAGNOSIS — E1322 Other specified diabetes mellitus with diabetic chronic kidney disease: Secondary | ICD-10-CM | POA: Diagnosis not present

## 2016-11-10 DIAGNOSIS — R748 Abnormal levels of other serum enzymes: Secondary | ICD-10-CM | POA: Diagnosis not present

## 2016-11-10 DIAGNOSIS — IMO0002 Reserved for concepts with insufficient information to code with codable children: Secondary | ICD-10-CM

## 2016-11-10 DIAGNOSIS — E1122 Type 2 diabetes mellitus with diabetic chronic kidney disease: Secondary | ICD-10-CM

## 2016-11-10 DIAGNOSIS — Z794 Long term (current) use of insulin: Secondary | ICD-10-CM | POA: Diagnosis not present

## 2016-11-10 DIAGNOSIS — N183 Chronic kidney disease, stage 3 (moderate): Secondary | ICD-10-CM | POA: Diagnosis not present

## 2016-11-10 DIAGNOSIS — E1165 Type 2 diabetes mellitus with hyperglycemia: Secondary | ICD-10-CM

## 2016-11-10 LAB — COMPREHENSIVE METABOLIC PANEL
ALT: 9 U/L (ref 0–35)
AST: 20 U/L (ref 0–37)
Albumin: 3.9 g/dL (ref 3.5–5.2)
Alkaline Phosphatase: 110 U/L (ref 39–117)
BUN: 15 mg/dL (ref 6–23)
CALCIUM: 9.3 mg/dL (ref 8.4–10.5)
CHLORIDE: 104 meq/L (ref 96–112)
CO2: 26 meq/L (ref 19–32)
Creatinine, Ser: 1.02 mg/dL (ref 0.40–1.20)
GFR: 57.66 mL/min — AB (ref >60.00)
Glucose, Bld: 83 mg/dL (ref 70–99)
POTASSIUM: 4.3 meq/L (ref 3.5–5.1)
Sodium: 137 mEq/L (ref 135–145)
Total Bilirubin: 0.4 mg/dL (ref 0.2–1.2)
Total Protein: 7.4 g/dL (ref 6.0–8.3)

## 2016-11-10 LAB — HEMOGLOBIN A1C: Hgb A1c MFr Bld: 6.7 % — ABNORMAL HIGH (ref 4.6–6.5)

## 2016-11-10 LAB — VITAMIN D 25 HYDROXY (VIT D DEFICIENCY, FRACTURES): VITD: 12.61 ng/mL — ABNORMAL LOW (ref 30.00–100.00)

## 2016-11-10 MED ORDER — INSULIN GLARGINE 100 UNIT/ML SOLOSTAR PEN: 40.0000 [IU] | PEN_INJECTOR | Freq: Every day | SUBCUTANEOUS | 5 refills | Status: DC

## 2016-11-10 NOTE — Assessment & Plan Note (Signed)
Fairly well controlled at home. Her diastolic numbers limit what we can do with her blood pressure medications. She'll continue her current regimen and continue to monitor blood pressure.

## 2016-11-10 NOTE — Patient Instructions (Signed)
Nice to see you. We'll check some lab work today and contact her with the results. I sent in Lantus to your pharmacy.

## 2016-11-10 NOTE — Assessment & Plan Note (Signed)
We referred her to endocrinology to consider workup for this though it does not appear this was discussed. We will proceed with lab work as outlined below to start today. If no obvious cause on the lab work we will have to proceed with nuclear medicine bone scintigraphy. If we have to proceed with that we'll have to make sure that she can do this given that she just has one kidney.

## 2016-11-10 NOTE — Assessment & Plan Note (Signed)
>>  ASSESSMENT AND PLAN FOR CONTROLLED DIABETES MELLITUS TYPE 2 WITH COMPLICATIONS (HCC) WRITTEN ON 11/10/2016  3:52 PM BY SONNENBERG, ERIC G, MD  Check A1c today. Refill Lantus .

## 2016-11-10 NOTE — Assessment & Plan Note (Signed)
Check A1c today. Refill Lantus.

## 2016-11-10 NOTE — Progress Notes (Signed)
  Tommi Rumps, MD Phone: [phone]  Danielle Park is a 66 y.o. female who presents today for follow-up.  Hypertension: Typically in the 130s over 60s at home. Occasionally up into the 226J systolically. Taking metoprolol, lisinopril, hydralazine, Lasix, amlodipine. No chest pain or shortness of breath. Occasional edema though this was also propping her legs. No orthopnea.  Diabetes: Running between 80 and 116 typically. Rarely drops into the 60s when she does not eat. She feels a little off and then eat something and it improves. No polyuria or polydipsia.  Elevated alkaline phosphatase: She's had evaluation for liver source and this was negative. She does have fatty liver and rarely has right upper quadrant discomfort. She has seen GI for her fatty liver and they advised weight loss. She saw endocrinology though they did not end up discussing her alkaline phosphatase being elevated.  PMH: nonsmoker.   ROS see history of present illness  Objective  Physical Exam Vitals:   11/10/16 1432  BP: (!) 150/78  Pulse: 63  Temp: 98.4 F (36.9 C)  SpO2: 97%    BP Readings from Last 3 Encounters:  11/10/16 (!) 150/78  08/17/16 (!) 156/69  08/09/16 (!) 150/64   Wt Readings from Last 3 Encounters:  11/10/16 195 lb 12.8 oz (88.8 kg)  08/17/16 198 lb 8 oz (90 kg)  08/09/16 199 lb 3.2 oz (90.4 kg)    Physical Exam  Constitutional: No distress.  Cardiovascular: Normal rate, regular rhythm and normal heart sounds.   Pulmonary/Chest: Effort normal and breath sounds normal.  Abdominal: Soft. Bowel sounds are normal. She exhibits no distension. There is no tenderness. There is no rebound and no guarding.  Neurological: She is alert. Gait normal.  Skin: Skin is warm and dry. She is not diaphoretic.     Assessment/Plan: Please see individual problem list.  Renovascular hypertension Fairly well controlled at home. Her diastolic numbers limit what we can do with her blood pressure  medications. She'll continue her current regimen and continue to monitor blood pressure.  Elevated alkaline phosphatase level We referred her to endocrinology to consider workup for this though it does not appear this was discussed. We will proceed with lab work as outlined below to start today. If no obvious cause on the lab work we will have to proceed with nuclear medicine bone scintigraphy. If we have to proceed with that we'll have to make sure that she can do this given that she just has one kidney.  Diabetes ([place]) Check A1c today. Refill Lantus.   Orders Placed This Encounter  Procedures  . HgB A1c  . PTH, intact (no Ca)  . Comp Met (CMET)  . Vitamin D (25 hydroxy)    Meds ordered this encounter  Medications  . Insulin Glargine (LANTUS SOLOSTAR) 100 UNIT/ML Solostar Pen    Sig: Inject 40 Units into the skin daily at 10 pm.    Dispense:  15 pen    Refill:  [place], MD [place]

## 2016-11-12 ENCOUNTER — Other Ambulatory Visit: Payer: Self-pay | Admitting: Family Medicine

## 2016-11-13 LAB — PARATHYROID HORMONE, INTACT (NO CA): PTH: 38 pg/mL (ref 14–64)

## 2016-11-17 ENCOUNTER — Telehealth: Payer: Self-pay | Admitting: Family Medicine

## 2016-11-17 NOTE — Telephone Encounter (Signed)
See result note.  

## 2016-11-17 NOTE — Telephone Encounter (Signed)
Pt called back returning your call from yesterday. Please advise, thank you!  Call pt @ [phone]

## 2016-11-18 ENCOUNTER — Other Ambulatory Visit: Payer: Self-pay | Admitting: Family Medicine

## 2016-11-18 DIAGNOSIS — E559 Vitamin D deficiency, unspecified: Secondary | ICD-10-CM

## 2016-11-18 MED ORDER — VITAMIN D (ERGOCALCIFEROL) 1.25 MG (50000 UNIT) PO CAPS: 50000.0000 [IU] | ORAL_CAPSULE | ORAL | 0 refills | Status: DC

## 2016-11-20 ENCOUNTER — Other Ambulatory Visit: Payer: Self-pay | Admitting: Family Medicine

## 2016-12-07 ENCOUNTER — Other Ambulatory Visit: Payer: Self-pay | Admitting: Family Medicine

## 2016-12-07 DIAGNOSIS — E559 Vitamin D deficiency, unspecified: Secondary | ICD-10-CM

## 2017-01-02 ENCOUNTER — Other Ambulatory Visit: Payer: Self-pay | Admitting: Family Medicine

## 2017-01-05 ENCOUNTER — Encounter (INDEPENDENT_AMBULATORY_CARE_PROVIDER_SITE_OTHER): Payer: PPO

## 2017-01-05 ENCOUNTER — Ambulatory Visit (INDEPENDENT_AMBULATORY_CARE_PROVIDER_SITE_OTHER): Payer: Medicare Other | Admitting: Vascular Surgery

## 2017-01-05 ENCOUNTER — Encounter (INDEPENDENT_AMBULATORY_CARE_PROVIDER_SITE_OTHER): Payer: Medicare Other

## 2017-01-07 ENCOUNTER — Other Ambulatory Visit: Payer: Self-pay | Admitting: Family Medicine

## 2017-01-08 NOTE — Telephone Encounter (Signed)
She can switch to over-the-counter vitamin D 2000 international units daily. Needs to have it rechecked. Thanks.

## 2017-01-08 NOTE — Telephone Encounter (Signed)
Please call the patient and make sure that she was informed of this. Thanks.

## 2017-01-08 NOTE — Telephone Encounter (Signed)
Please advise for refill, had labs in August, has upcoming appt for recheck, not sure if you want to continue or switch to OTC, thanks

## 2017-01-09 NOTE — Telephone Encounter (Signed)
Left message to return call 

## 2017-01-11 ENCOUNTER — Other Ambulatory Visit: Payer: Self-pay | Admitting: Family Medicine

## 2017-01-12 ENCOUNTER — Ambulatory Visit (INDEPENDENT_AMBULATORY_CARE_PROVIDER_SITE_OTHER): Payer: PPO | Admitting: Vascular Surgery

## 2017-01-12 ENCOUNTER — Other Ambulatory Visit (INDEPENDENT_AMBULATORY_CARE_PROVIDER_SITE_OTHER): Payer: PPO

## 2017-01-12 ENCOUNTER — Encounter (INDEPENDENT_AMBULATORY_CARE_PROVIDER_SITE_OTHER): Payer: Self-pay | Admitting: Vascular Surgery

## 2017-01-12 VITALS — BP 129/58 | HR 54 | Resp 17 | Ht 62.0 in | Wt 192.0 lb

## 2017-01-12 DIAGNOSIS — I150 Renovascular hypertension: Secondary | ICD-10-CM

## 2017-01-12 DIAGNOSIS — E1322 Other specified diabetes mellitus with diabetic chronic kidney disease: Secondary | ICD-10-CM | POA: Diagnosis not present

## 2017-01-12 DIAGNOSIS — N183 Chronic kidney disease, stage 3 (moderate): Secondary | ICD-10-CM | POA: Diagnosis not present

## 2017-01-12 DIAGNOSIS — I701 Atherosclerosis of renal artery: Secondary | ICD-10-CM

## 2017-01-12 DIAGNOSIS — I739 Peripheral vascular disease, unspecified: Secondary | ICD-10-CM

## 2017-01-12 DIAGNOSIS — Z794 Long term (current) use of insulin: Secondary | ICD-10-CM

## 2017-01-12 NOTE — Assessment & Plan Note (Signed)
>>  ASSESSMENT AND PLAN FOR CONTROLLED DIABETES MELLITUS TYPE 2 WITH COMPLICATIONS (HCC) WRITTEN ON 01/12/2017  9:36 AM BY DEW, JASON S, MD  blood glucose control important in reducing the progression of atherosclerotic disease. Also, involved in wound healing. On appropriate medications.

## 2017-01-12 NOTE — Assessment & Plan Note (Signed)
blood glucose control important in reducing the progression of atherosclerotic disease. Also, involved in wound healing. On appropriate medications.  

## 2017-01-12 NOTE — Assessment & Plan Note (Signed)
Her renal artery duplex shows some mildly elevated velocities in the right renal artery which is likely compensatory secondary to being a solitary kidney.  Her renal aortic ratio would not indicate significant stenosis and it appears as if her intervention is widely patent.  Doing well.  Continue aspirin and Plavix.  Recheck in 6 months with follow-up duplex or sooner if problems develop in the interim.

## 2017-01-12 NOTE — Progress Notes (Signed)
MRN : [id number]  Danielle Park is a 66 y.o. (09-Aug-1950) female who presents with chief complaint of  Chief Complaint  Patient presents with  . Follow-up    58monthrenal ultrasound  .  History of Present Illness: Patient returns today in follow up of multiple vascular issues. She underwent right renal artery stent placement about 6 months ago and that has significantly improved her BP.  Her function is stable as far as she knows.  Her legs are doing well s/p previous LE intervention for claudication.  She currently has only mild pain.  Her noninvasive studies today demonstrate stable, normal ABIs of 1.02 on the right left with normal triphasic waveforms and normal digital pressures.  Her renal artery duplex shows some mildly elevated velocities in the right renal artery which is likely compensatory secondary to being a solitary kidney.  Her renal aortic ratio would not indicate significant stenosis and it appears as if her intervention is widely patent.  Current Outpatient Prescriptions  Medication Sig Dispense Refill  . amLODipine (NORVASC) 10 MG tablet TAKE 1 TABLET BY MOUTH ONCE DAILY 90 tablet 3  . aspirin 81 MG tablet Take 81 mg by mouth daily.    . clopidogrel (PLAVIX) 75 MG tablet Take 1 tablet (75 mg total) by mouth daily. 30 tablet 11  . furosemide (LASIX) 20 MG tablet Take 1 tablet (20 mg total) by mouth daily. 30 tablet 11  . glipiZIDE (GLUCOTROL XL) 10 MG 24 hr tablet TAKE ONE TABLET BY MOUTH ONCE DAILY 90 tablet 3  . hydrALAZINE (APRESOLINE) 25 MG tablet Take 25 mg by mouth 2 (two) times daily as needed.    . Insulin Glargine (LANTUS SOLOSTAR) 100 UNIT/ML Solostar Pen Inject 35 Units into the skin daily at 10 pm. 5 pen 11  . levothyroxine (SYNTHROID, LEVOTHROID) 75 MCG tablet TAKE ONE TABLET BY MOUTH ONCE DAILY BEFORE BREAKFAST 90 tablet 0  . lisinopril (PRINIVIL,ZESTRIL) 40 MG tablet Take 1 tablet (40 mg total) by mouth daily. 90 tablet 3  . metoprolol tartrate  (LOPRESSOR) 25 MG tablet Take 1 tablet (25 mg total) by mouth 2 (two) times daily. 180 tablet 1  . NOVOFINE 32G X 6 MM MISC USE AS DIRECTED WITH LEVEMIR PEN 100 each 3   No current facility-administered medications for this visit.         Past Medical History:  Diagnosis Date  . Chicken pox   . 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 Congenital single kidney   . Diabetes mellitus   . Hypertension   . UTI (urinary tract infection)          Past Surgical History:  Procedure Laterality Date  . CESAREAN SECTION    . PERIPHERAL VASCULAR CATHETERIZATION N/A 01/11/2015   Procedure: Abdominal Aortogram w/Lower Extremity;  Surgeon: JAlgernon Huxley MD;  [facility];  Service: Cardiovascular;  Laterality: N/A;  . PERIPHERAL VASCULAR CATHETERIZATION  01/11/2015   Procedure: Lower Extremity Intervention;  Surgeon: JAlgernon Huxley MD;  [facility];  Service: Cardiovascular;;  . RENAL ARTERY STENT  2014   [facility]  . VAGINAL DELIVERY      Social History     Social History  Substance Use Topics  . Smoking status: Never Smoker  . Smokeless tobacco: Not on file  . Alcohol use No  No IV drug use  Family History  Family History  Problem Relation Age of Onset  . Hypertension Mother   . Diabetes Mother   . Heart disease Mother     s/p CABG x 2  . Hypertension Father   . Cancer Father     lung   . Diabetes Sister   . Heart disease Sister   . Heart disease Sister   . Cancer Sister     colon and ovary?  . Heart disease Other   . Diabetes Other      No Known Allergies   REVIEW OF SYSTEMS (Negative unless checked)  Constitutional: _0 Weight loss  _1 Fever  _2 Chills Cardiac: _3 Chest pain   _4 Chest pressure   _5 Palpitations   _6 Shortness of breath when laying flat   _7 Shortness of breath at rest   _8 Shortness of breath with exertion. Vascular:   _9 Pain in legs with walking   _10 Pain in legs at rest   _11 Pain in legs when laying flat   _12 Claudication   _13 Pain in feet when walking  _14 Pain in feet at rest  _15 Pain in feet when laying flat   _16 History of DVT   _17 Phlebitis   _18 Swelling in legs   _19 Varicose veins   _20 Non-healing ulcers Pulmonary:   _21 Uses home oxygen   _22 Productive cough   _23 Hemoptysis   _24 Wheeze  _25 COPD   _26 Asthma Neurologic:  _27 Dizziness  _28 Blackouts   _29 Seizures   _30 History of stroke   _31 History of TIA  _32 Aphasia   _33 Temporary blindness   _34 Dysphagia   _35 Weakness or numbness in arms   _36 Weakness or numbness in legs Musculoskeletal:  _37 Arthritis   _38 Joint swelling   _39 Joint pain   _40 Low back pain Hematologic:  _41 Easy bruising  _42 Easy bleeding   _43 Hypercoagulable state   _44 Anemic   Gastrointestinal:  _45 Blood in stool   _46 Vomiting blood  _47 Gastroesophageal reflux/heartburn   _48 Abdominal pain Genitourinary:  _49 Chronic kidney disease   _50 Difficult urination  _51 Frequent urination  _52 Burning with urination   _53 Hematuria Skin:  _54 Rashes   _55 Ulcers   _56 Wounds Psychological:  _57 History of anxiety   _58  History of major depression.   Physical Examination  BP (!) 129/58 (BP Location: Right Arm)   Pulse (!) 54   Resp 17   Ht _59  (1.575 m)   Wt 87.1 kg (192 lb)   BMI 35.12 kg/m  Gen:  WD/WN, NAD Head: [place]/AT, No temporalis wasting. Ear/Nose/Throat: Hearing grossly intact, nares w/o erythema or drainage, trachea midline Eyes: Conjunctiva clear. Sclera non-icteric Neck: Supple.  No JVD.  Pulmonary:  Good air movement, no use of accessory muscles.  Cardiac: RRR, normal S1, S2 Vascular:  Vessel Right Left  Radial Palpable Palpable                          PT Palpable Palpable  DP Palpable Palpable    Musculoskeletal: M/S 5/5 throughout.  No deformity or atrophy.  Neurologic: Sensation grossly intact in extremities.  Symmetrical.  Speech is fluent.  Psychiatric: Judgment intact, Mood & affect appropriate for pt's  clinical situation. Dermatologic: No rashes or ulcers noted.  No cellulitis or open wounds.       Labs Recent Results (from the past 2160 hour(s))  HgB A1c     Status: Abnormal   Collection Time: 11/10/16  3:02 PM  Result Value Ref Range   Hgb A1c MFr Bld 6.7 (H) 4.6 - 6.5 %    Comment: Glycemic Control Guidelines for People with Diabetes:Non Diabetic:  <6%Goal of Therapy: <  7%Additional Action Suggested:  >8%   PTH, intact (no Ca)     Status: None   Collection Time: 11/10/16  3:02 PM  Result Value Ref Range   PTH 38 14 - 64 pg/mL    Comment:   Interpretive Guide:                              Intact PTH               Calcium                              ----------               ------- Normal Parathyroid           Normal                   Normal Hypoparathyroidism           Low or Low Normal        Low Hyperparathyroidism      Primary                 Normal or High           High      Secondary               High                     Normal or Low      Tertiary                High                     High Non-Parathyroid   Hypercalcemia              Low or Low Normal        High   Comp Met (CMET)     Status: Abnormal   Collection Time: 11/10/16  3:02 PM  Result Value Ref Range   Sodium 137 135 - 145 mEq/L   Potassium 4.3 3.5 - 5.1 mEq/L   Chloride 104 96 - 112 mEq/L   CO2 26 19 - 32 mEq/L   Glucose, Bld 83 70 - 99 mg/dL   BUN 15 6 - 23 mg/dL   Creatinine, Ser 1.02 0.40 - 1.20 mg/dL   Total Bilirubin 0.4 0.2 - 1.2 mg/dL   Alkaline Phosphatase 110 39 - 117 U/L   AST 20 0 - 37 U/L   ALT 9 0 - 35 U/L   Total Protein 7.4 6.0 - 8.3 g/dL   Albumin 3.9 3.5 - 5.2 g/dL   Calcium 9.3 8.4 - 10.5 mg/dL   GFR 57.66 (L) >60.00 mL/min  Vitamin D (25 hydroxy)     Status: Abnormal   Collection Time: 11/10/16  3:02 PM  Result Value Ref Range   VITD 12.61 (L) 30.00 - 100.00 ng/mL    Radiology No results found.   Assessment/Plan  Renovascular hypertension Blood pressure  control significantly improved after intervention  PVD (peripheral vascular disease) (HCC) Her noninvasive studies today demonstrate stable, normal ABIs of 1.02 on the right left with normal triphasic waveforms and normal digital pressures.  Doing well status post previous intervention.  Continue current medical regimen including aspirin and Plavix.  Recheck in 1 year  Diabetes ([place]) blood  glucose control important in reducing the progression of atherosclerotic disease. Also, involved in wound healing. On appropriate medications.   Renal artery stenosis (HCC)   Her renal artery duplex shows some mildly elevated velocities in the right renal artery which is likely compensatory secondary to being a solitary kidney.  Her renal aortic ratio would not indicate significant stenosis and it appears as if her intervention is widely patent.  Doing well.  Continue aspirin and Plavix.  Recheck in 6 months with follow-up duplex or sooner if problems develop in the interim.    Leotis Pain, MD  01/12/2017 9:37 AM    This note was created with Dragon medical transcription system.  Any errors from dictation are purely unintentional

## 2017-01-12 NOTE — Assessment & Plan Note (Signed)
Blood pressure control significantly improved after intervention

## 2017-01-12 NOTE — Patient Instructions (Signed)

## 2017-01-12 NOTE — Assessment & Plan Note (Signed)
Her noninvasive studies today demonstrate stable, normal ABIs of 1.02 on the right left with normal triphasic waveforms and normal digital pressures.  Doing well status post previous intervention.  Continue current medical regimen including aspirin and Plavix.  Recheck in 1 year

## 2017-01-18 NOTE — Telephone Encounter (Signed)
Patient is scheduled for lab

## 2017-01-22 ENCOUNTER — Other Ambulatory Visit (INDEPENDENT_AMBULATORY_CARE_PROVIDER_SITE_OTHER): Payer: PPO

## 2017-01-22 DIAGNOSIS — E559 Vitamin D deficiency, unspecified: Secondary | ICD-10-CM | POA: Diagnosis not present

## 2017-01-23 LAB — VITAMIN D 25 HYDROXY (VIT D DEFICIENCY, FRACTURES): VITD: 24.22 ng/mL — AB (ref 30.00–100.00)

## 2017-01-24 ENCOUNTER — Other Ambulatory Visit: Payer: Self-pay | Admitting: Family Medicine

## 2017-01-25 ENCOUNTER — Encounter: Payer: Self-pay | Admitting: *Deleted

## 2017-01-28 ENCOUNTER — Other Ambulatory Visit: Payer: Self-pay | Admitting: Family Medicine

## 2017-02-26 ENCOUNTER — Encounter: Payer: Self-pay | Admitting: Family Medicine

## 2017-02-26 ENCOUNTER — Ambulatory Visit: Payer: PPO | Admitting: Family Medicine

## 2017-02-26 ENCOUNTER — Other Ambulatory Visit: Payer: Self-pay

## 2017-02-26 VITALS — BP 154/60 | HR 61 | Temp 97.6°F | Wt 196.0 lb

## 2017-02-26 DIAGNOSIS — Z794 Long term (current) use of insulin: Secondary | ICD-10-CM

## 2017-02-26 DIAGNOSIS — E1322 Other specified diabetes mellitus with diabetic chronic kidney disease: Secondary | ICD-10-CM | POA: Diagnosis not present

## 2017-02-26 DIAGNOSIS — E559 Vitamin D deficiency, unspecified: Secondary | ICD-10-CM | POA: Diagnosis not present

## 2017-02-26 DIAGNOSIS — I150 Renovascular hypertension: Secondary | ICD-10-CM

## 2017-02-26 DIAGNOSIS — N183 Chronic kidney disease, stage 3 (moderate): Secondary | ICD-10-CM

## 2017-02-26 DIAGNOSIS — R748 Abnormal levels of other serum enzymes: Secondary | ICD-10-CM

## 2017-02-26 DIAGNOSIS — I739 Peripheral vascular disease, unspecified: Secondary | ICD-10-CM

## 2017-02-26 NOTE — Assessment & Plan Note (Signed)
Possibly related to low vitamin D.  She has been taking a supplement.  We will recheck that today.  We will recheck an alkaline phosphatase.  She has been hesitant to proceed with any further evaluation.

## 2017-02-26 NOTE — Patient Instructions (Signed)
Nice to see you. Please continue to monitor your blood sugar blood pressure. We will check lab work today and contact you with the results.

## 2017-02-26 NOTE — Assessment & Plan Note (Addendum)
Continue to follow with vascular surgery

## 2017-02-26 NOTE — Assessment & Plan Note (Signed)
Fairly well controlled at home.  She will continue her current regimen.

## 2017-02-26 NOTE — Progress Notes (Signed)
Tommi Rumps, MD Phone: [phone]  Danielle Park is a 66 y.o. female who presents today for f/u.  Hypertension: Typically 130 over 27s.  Taking amlodipine, Lasix, hydralazine, lisinopril, metoprolol.  No chest pain or shortness of breath.  Notes chronic intermittent edema only when she stands up for long periods of time at work.  No worse than previously.  No orthopnea.  Diabetes: Ranging between 63 and 113.  No polyuria or polydipsia.  Does not eat consistently.  Has a piece of toast for breakfast.  Takes Lantus 40 units daily.  Glipizide 10 mg in the morning.  Vitamin D deficiency: Taking vitamin D consistently.  Has elevated phosphatase as well.  Discussed bone scintigraphy and patient has declined this.  Social History   Tobacco Use  Smoking Status Never Smoker  Smokeless Tobacco Never Used     ROS see history of present illness  Objective  Physical Exam Vitals:   02/26/17 1524  BP: (!) 154/60  Pulse: 61  Temp: 97.6 F (36.4 C)  SpO2: 98%    BP Readings from Last 3 Encounters:  02/26/17 (!) 154/60  01/12/17 (!) 129/58  11/10/16 (!) 150/78   Wt Readings from Last 3 Encounters:  02/26/17 196 lb (88.9 kg)  01/12/17 192 lb (87.1 kg)  11/10/16 195 lb 12.8 oz (88.8 kg)    Physical Exam  Constitutional: No distress.  HENT:  Head: Normocephalic and atraumatic.  Cardiovascular: Normal rate, regular rhythm and normal heart sounds.  Pulmonary/Chest: Effort normal and breath sounds normal.  Musculoskeletal: She exhibits no edema.  Neurological: She is alert. Gait normal.  Skin: Skin is warm and dry. She is not diaphoretic.   Diabetic Foot Exam - Simple   Simple Foot Form Diabetic Foot exam was performed with the following findings:  Yes 02/26/2017  3:30 PM  Visual Inspection See comments:  Yes Sensation Testing Intact to touch and monofilament testing bilaterally:  Yes Pulse Check See comments:  Yes Comments Callus over right tibial aspect great toe,  otherwise no deformities noted, no ulcerations or skin breakdown   Difficult to palpate pulses in bilateral feet, they appear warm and well perfused, recent ABIs reviewed and normal   Assessment/Plan: Please see individual problem list.  PVD (peripheral vascular disease) ([place]) Continue to follow with vascular surgery.  Renovascular hypertension Fairly well controlled at home.  She will continue her current regimen.  Diabetes ([place]) Intermittent lows.  Not eating breakfast.  I encouraged her to eat a more substantial meal in the morning.  We will check an A1c.  She will continue her current regimen.  Elevated alkaline phosphatase level Possibly related to low vitamin D.  She has been taking a supplement.  We will recheck that today.  We will recheck an alkaline phosphatase.  She has been hesitant to proceed with any further evaluation.   Danielle Park was seen today for follow-up.  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 Comp Met (CMET)  . HgB A1c  . Vitamin D (25 hydroxy)    No orders of the defined types were placed in this encounter.  Tommi Rumps, MD [place]

## 2017-02-26 NOTE — Assessment & Plan Note (Signed)
Intermittent lows.  Not eating breakfast.  I encouraged her to eat a more substantial meal in the morning.  We will check an A1c.  She will continue her current regimen.

## 2017-02-26 NOTE — Assessment & Plan Note (Signed)
>>  ASSESSMENT AND PLAN FOR CONTROLLED DIABETES MELLITUS TYPE 2 WITH COMPLICATIONS (HCC) WRITTEN ON 02/26/2017  7:21 PM BY SONNENBERG, ERIC G, MD  Intermittent lows.  Not eating breakfast.  I encouraged her to eat a more substantial meal in the morning.  We will check an A1c.  She will continue her current regimen.

## 2017-02-27 ENCOUNTER — Other Ambulatory Visit: Payer: Self-pay | Admitting: Family Medicine

## 2017-02-27 DIAGNOSIS — E559 Vitamin D deficiency, unspecified: Secondary | ICD-10-CM

## 2017-02-27 LAB — COMPREHENSIVE METABOLIC PANEL
ALBUMIN: 4.3 g/dL (ref 3.5–5.2)
ALK PHOS: 127 U/L — AB (ref 39–117)
ALT: 7 U/L (ref 0–35)
AST: 17 U/L (ref 0–37)
BUN: 16 mg/dL (ref 6–23)
CALCIUM: 9.6 mg/dL (ref 8.4–10.5)
CHLORIDE: 100 meq/L (ref 96–112)
CO2: 27 mEq/L (ref 19–32)
Creatinine, Ser: 1.08 mg/dL (ref 0.40–1.20)
GFR: 53.93 mL/min — ABNORMAL LOW (ref >60.00)
Glucose, Bld: 165 mg/dL — ABNORMAL HIGH (ref 70–99)
POTASSIUM: 4 meq/L (ref 3.5–5.1)
SODIUM: 136 meq/L (ref 135–145)
TOTAL PROTEIN: 7.6 g/dL (ref 6.0–8.3)
Total Bilirubin: 0.5 mg/dL (ref 0.2–1.2)

## 2017-02-27 LAB — VITAMIN D 25 HYDROXY (VIT D DEFICIENCY, FRACTURES): VITD: 24.83 ng/mL — AB (ref 30.00–100.00)

## 2017-02-27 LAB — HEMOGLOBIN A1C: HEMOGLOBIN A1C: 6.8 % — AB (ref 4.6–6.5)

## 2017-02-27 MED ORDER — VITAMIN D (ERGOCALCIFEROL) 1.25 MG (50000 UNIT) PO CAPS: 50000.0000 [IU] | ORAL_CAPSULE | ORAL | 0 refills | Status: DC

## 2017-03-03 IMAGING — CR DG LUMBAR SPINE COMPLETE 4+V
5 series · 5 of 5 positions shown · non-contrast
Comparison: None.

CLINICAL DATA: 63-year-old female with chronic pain radiating to
the right hip. No known injury. Initial encounter.

EXAM:
LUMBAR SPINE - COMPLETE 4+ VIEW

[view not recorded (1 of 5)]
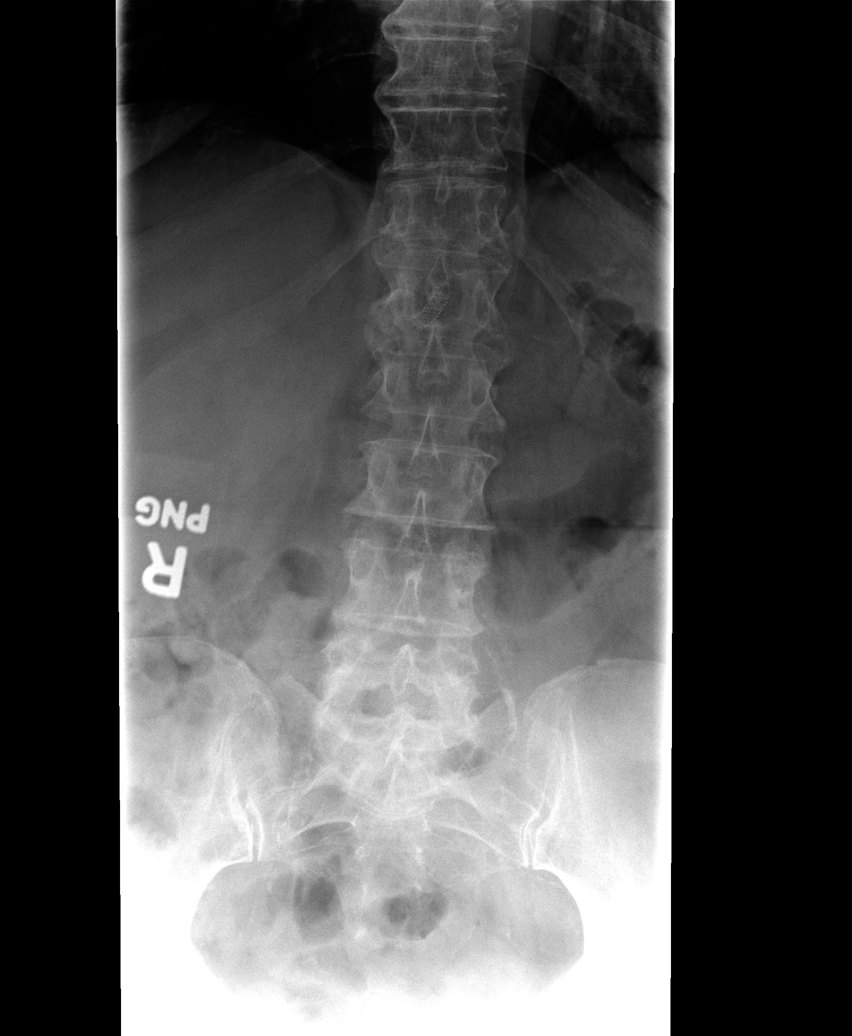

[view not recorded (2 of 5)]
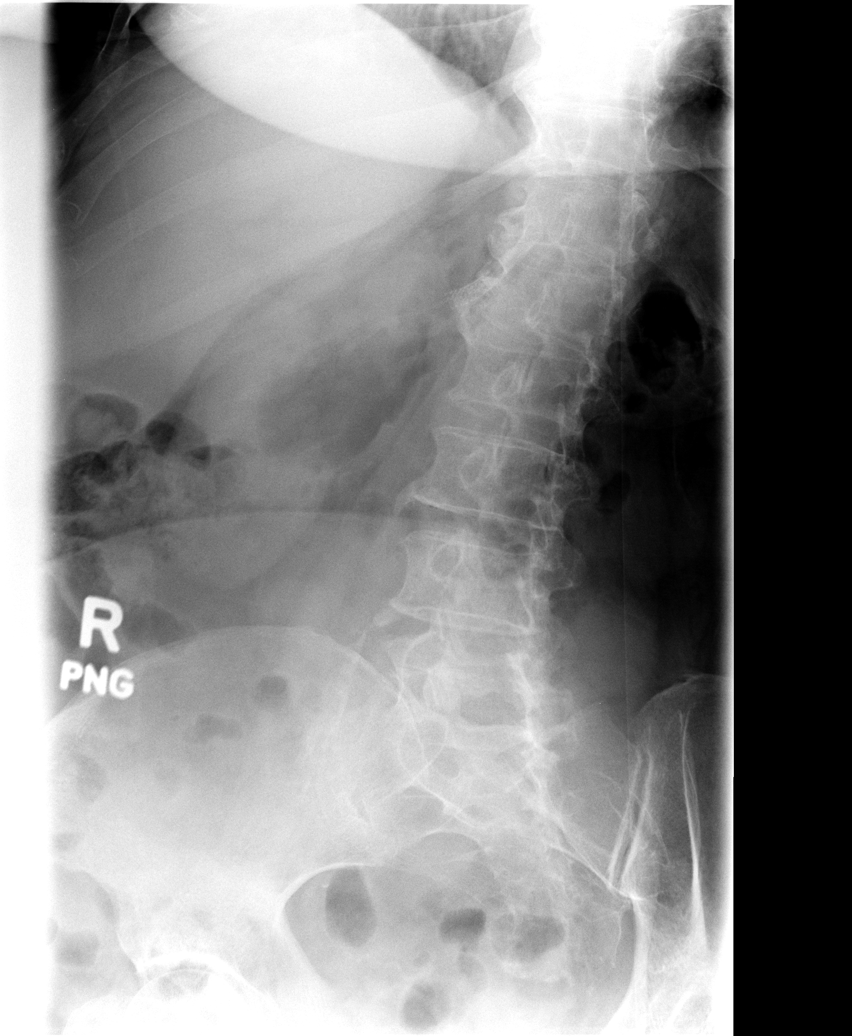

[view not recorded (3 of 5)]
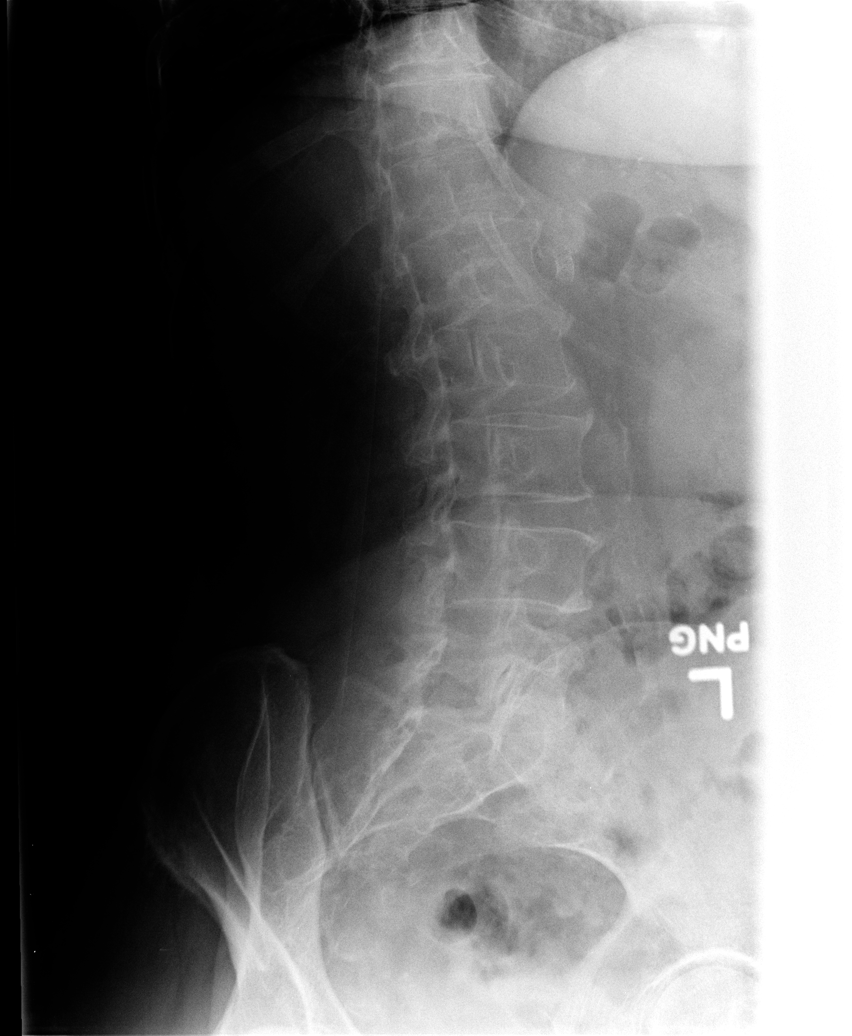

[view not recorded (4 of 5)]
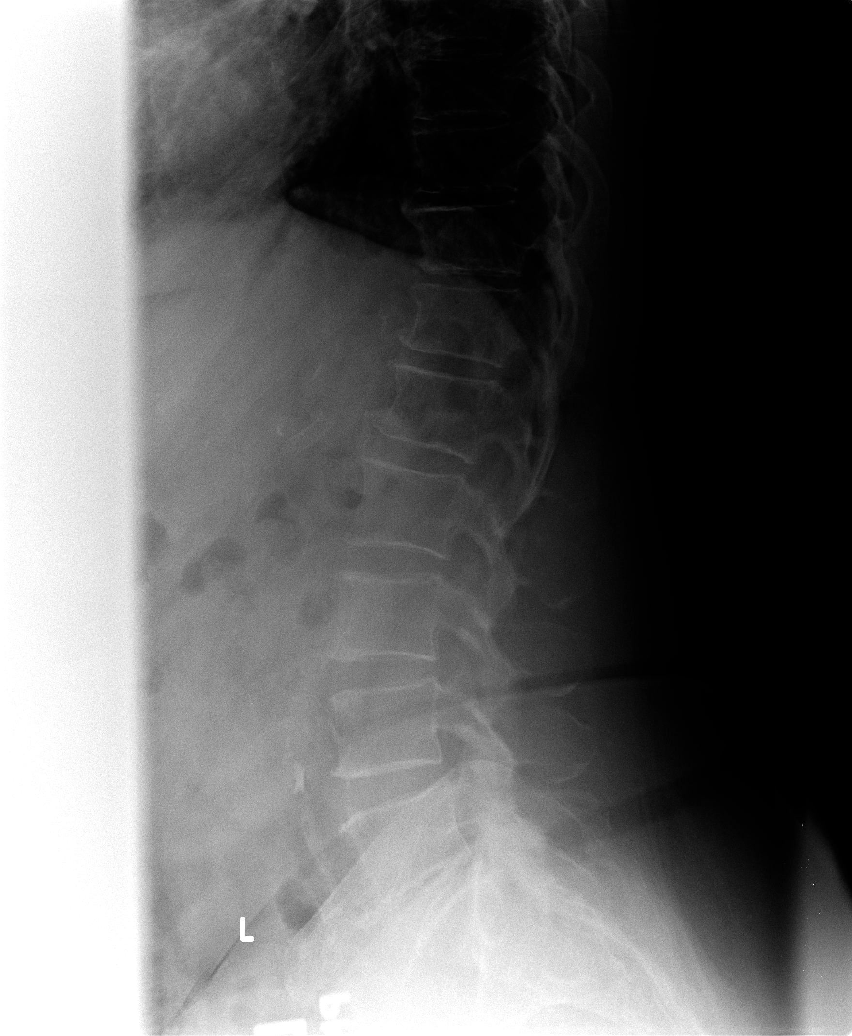

[view not recorded (5 of 5)]
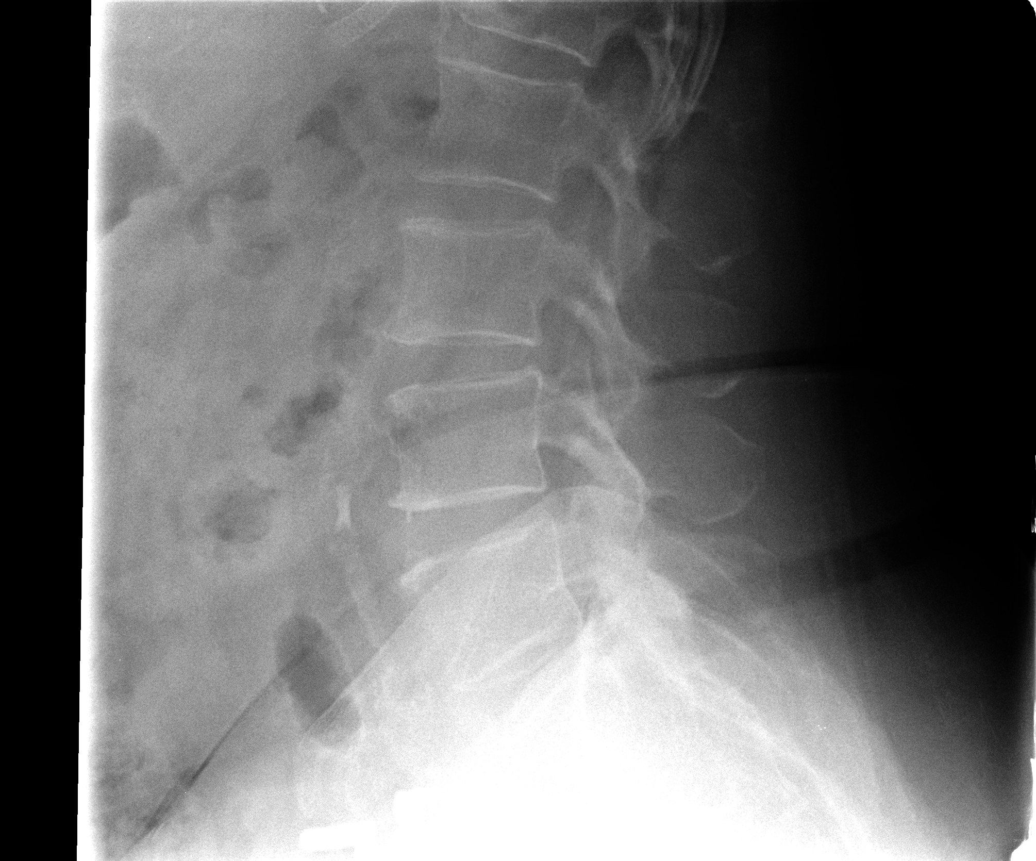

[5 of 5 positions shown; findings below may reference images not displayed]

FINDINGS: Calcified aortic atherosclerosis. Mesenteric artery region vascular
stent projects at L1.

Normal lumbar segmentation. Vertebral height and alignment within
normal limits. No pars fracture. Moderate facet hypertrophy at L5-S1
and L4-L5. Relatively preserved disc spaces. Visible lower thoracic
levels appear intact. Sacral ala and SI joints within normal limits.
IMPRESSION: 1. No acute osseous abnormality identified in the lumbar spine. Up
to moderate lower lumbar facet degeneration.
2. Aortic calcified atherosclerosis with mesenteric artery region
vascular stent.

## 2017-03-12 ENCOUNTER — Encounter: Payer: Self-pay | Admitting: Family Medicine

## 2017-03-12 ENCOUNTER — Ambulatory Visit: Payer: PPO | Admitting: Family Medicine

## 2017-03-12 VITALS — BP 148/78 | HR 70 | Temp 98.1°F | Resp 18 | Ht 62.0 in | Wt 195.5 lb

## 2017-03-12 DIAGNOSIS — I739 Peripheral vascular disease, unspecified: Secondary | ICD-10-CM

## 2017-03-12 DIAGNOSIS — I150 Renovascular hypertension: Secondary | ICD-10-CM | POA: Diagnosis not present

## 2017-03-12 DIAGNOSIS — Z794 Long term (current) use of insulin: Secondary | ICD-10-CM | POA: Diagnosis not present

## 2017-03-12 DIAGNOSIS — E119 Type 2 diabetes mellitus without complications: Secondary | ICD-10-CM | POA: Diagnosis not present

## 2017-03-12 NOTE — Progress Notes (Signed)
Subjective:    Patient ID: Danielle Park, female    DOB: [birth year], 66 y.o.   MRN: [id number]  HPI  [name] is a 66 year old female who presents today with elevated blood pressure readings from home monitoring. She reports readings on 03/11/17 of a systolic average of 166 and diastolic average of 82. She brought her home readings in which indicated a single high of 200/71 and low reading of 139/62 on her home equipment.  She states that she was nervous and took her blood pressure 5 times to monitor for higher readings and wanted to be seen in this office so she can "stop worrying about her BP". She stated that her blood pressure did decrease after relaxing and her anxiety decreased prior to this visit. Prior home readings are noted as systolic average of 130s and diastolic averages of 60s. Patient is taking amlodipine, lasix, hydralazine, lisinopril, and metoprolol. No adverse effects are reported from these medications. She reports that her blood pressure this morning was 153/64 about 30 minutes after taking her medication. She denies chest pain or SOB. Intermittent chronic lower extremity edema with extended periods of standing while working. She denies edema when she is at home and has time to sit down and not stand all day.  History of solitary kidney. She reports that she is due for follow up with her nephrologist She does not follow a particular diet and does not monitor her salt intake.   She is followed by vascular surgery for PVD and reports doing well without leg pain.  Diabetes: Readings range between 68 to 114. She denies polyuria, polydipsia. She denies recent lows and states that she brings source of sugar with her when working  if this occurs. She is taking glipizide 10 mg and lantus 40 U daily.    Review of Systems  Constitutional: Negative for chills, fatigue and fever.  Eyes: Negative for visual disturbance.  Respiratory: Negative for cough, shortness of breath and  wheezing.   Cardiovascular: Negative for chest pain and palpitations.  Gastrointestinal: Negative for abdominal pain, diarrhea, nausea and vomiting.  Endocrine: Negative for polydipsia, polyphagia and polyuria.  Genitourinary: Negative for dysuria.  Neurological: Negative for dizziness, weakness, light-headedness and headaches.  Psychiatric/Behavioral:       Denies depressed or anxious mood   Past Medical History:  Diagnosis Date  . Chicken pox   . 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 Congenital single kidney   . Diabetes mellitus   . Hypertension   . UTI (urinary tract infection)      Social History   Socioeconomic History  . Marital status: Married    Spouse name: Not on file  . Number of children: Not on file  . Years of education: Not on file  . Highest education level: Not on file  Social Needs  . Financial resource strain: Not on file  . Food insecurity - worry: Not on file  . Food insecurity - inability: Not on file  . Transportation needs - medical: Not on file  . Transportation needs - non-medical: Not on file  Occupational History  . Not on file  Tobacco Use  . Smoking status: Never Smoker  . Smokeless tobacco: Never Used  Substance and Sexual Activity  . Alcohol use: No    Alcohol/week: 0.0 oz  . Drug use: No  . Sexual activity: Not on file  Other Topics Concern  . Not on file  Social History Narrative   Lives in [place] with husband. 1 dog      Work - Administrator, [employer]   Diet - regular diet   Exercise - occasional, very active at work    Past Surgical History:  Procedure Laterality Date  . CESAREAN SECTION    . PERIPHERAL VASCULAR CATHETERIZATION N/A 01/11/2015   Procedure: Abdominal Aortogram w/Lower Extremity;  Surgeon: Annice NeedyJason S Dew, MD;  Location: [facility] INVASIVE CV LAB;  Service: Cardiovascular;  Laterality: N/A;  . PERIPHERAL VASCULAR CATHETERIZATION  01/11/2015   Procedure: Lower Extremity Intervention;  Surgeon: Annice NeedyJason S  Dew, MD;  Location: [facility] INVASIVE CV LAB;  Service: Cardiovascular;;  . RENAL ARTERY STENT  2014   Dr. Wyn Quakerew [facility]  . VAGINAL DELIVERY      Family History  Problem Relation Age of Onset  . Hypertension Mother   . Diabetes Mother   . Heart disease Mother        s/p CABG x 2  . Hypertension Father   . Cancer Father        lung   . Diabetes Sister   . Heart disease Sister   . Heart disease Sister   . Cancer - Lung Brother   . Cancer Sister        lung  . Heart disease Other   . Diabetes Other     No Known Allergies  Current Outpatient Medications on File Prior to Visit  Medication Sig Dispense Refill  . amLODipine (NORVASC) 10 MG tablet TAKE 1 TABLET (10 MG TOTAL) BY MOUTH DAILY. 90 tablet 1  . aspirin 81 MG tablet Take 81 mg by mouth daily.    . clopidogrel (PLAVIX) 75 MG tablet Take 1 tablet (75 mg total) by mouth daily. 30 tablet 11  . furosemide (LASIX) 20 MG tablet TAKE 1 TABLET BY MOUTH EVERY DAY 30 tablet 3  . glipiZIDE (GLUCOTROL XL) 10 MG 24 hr tablet TAKE ONE TABLET BY MOUTH ONCE DAILY 90 tablet 3  . hydrALAZINE (APRESOLINE) 25 MG tablet TAKE 1 TABLET (25 MG TOTAL) BY MOUTH 3 (THREE) TIMES DAILY. 270 tablet 1  . Insulin Glargine (LANTUS SOLOSTAR) 100 UNIT/ML Solostar Pen Inject 40 Units into the skin daily at 10 pm. 15 pen 5  . Insulin Pen Needle (NOVOFINE) 32G X 6 MM MISC USE AS DIRECTED WITH LEVEMIR PEN 100 each 3  . levothyroxine (SYNTHROID, LEVOTHROID) 75 MCG tablet TAKE ONE TABLET BY MOUTH ONCE DAILY BEFORE BREAKFAST 90 tablet 1  . lisinopril (PRINIVIL,ZESTRIL) 40 MG tablet Take 1 tablet (40 mg total) by mouth daily. 90 tablet 3  . metoprolol tartrate (LOPRESSOR) 25 MG tablet TAKE 1 TABLET (25 MG TOTAL) BY MOUTH 2 (TWO) TIMES DAILY. 180 tablet 1  . rosuvastatin (CRESTOR) 10 MG tablet TAKE 1 TABLET (10 MG TOTAL) BY MOUTH DAILY. 90 tablet 1  . Vitamin D, Ergocalciferol, (DRISDOL) 50000 units CAPS capsule Take 1 capsule (50,000 Units total) by  mouth every 7 (seven) days. 8 capsule 0   No current facility-administered medications on file prior to visit.     BP (!) 148/78   Pulse 70   Temp 98.1 F (36.7 C) (Oral)   Resp 18   Ht 5\' 2"  (1.575 m)   Wt 195 lb 8 oz (88.7 kg)   SpO2 97%   BMI 35.76 kg/m       Objective:   Physical Exam  Constitutional: She is oriented to person, place, and time. She appears  well-developed and well-nourished.  Eyes: Pupils are equal, round, and reactive to light. No scleral icterus.  Neck: Neck supple.  Cardiovascular: Normal rate, regular rhythm and intact distal pulses.  Pulmonary/Chest: Effort normal and breath sounds normal. She has no wheezes. She has no rales.  Abdominal: Soft. Bowel sounds are normal. There is no tenderness. There is no rebound.  Musculoskeletal: She exhibits no edema.  Lymphadenopathy:    She has no cervical adenopathy.  Neurological: She is alert and oriented to person, place, and time. Coordination normal.  Skin: Skin is warm and dry. No rash noted.  Psychiatric: She has a normal mood and affect. Her behavior is normal. Judgment and thought content normal.      Assessment & Plan:  1. Renovascular hypertension Retake of BP 148/78. Home systolic and diastolic averages demonstrate reasonable control of her BP.  One elevated home reading that was noted to be obtained while patient was anxious and subsequent readings demonstrated a decrease does not support changing medication regimen at this time. Advised patient to monitor blood pressure, document readings, provided her with parameters and also advised her to bring her cuff with her to her next visit to check accuracy of home cuff.  Further advised her to make the follow up appointment with her nephrologist that is due. She stated that she would do this today.  2. PVD (peripheral vascular disease) (HCC) Improved; further advised continued follow up with vascular surgery that was previously recommended by PCP  3.  Type 2 diabetes mellitus without complication, with long-term current use of insulin (HCC) Advised eating consistent meals throughout the day and check blood sugar daily as recommended by PCP. Follow up with PCP as directed in 6 months or sooner if needed.  Advised follow up as recommended by PCP or sooner if BP monitoring indicates BP >140/90. Follow up with nephrologist as discussed above. DASH diet reviewed and written information provided.  Roddie McJulia Azlyn Wingler, FNP-C

## 2017-03-12 NOTE — Patient Instructions (Signed)
Please monitor your blood pressure one time a day, document readings, and bring your readings and your home blood pressure cuff with you to your next visit with your kidney specialist and Dr. Birdie SonsSonnenberg.  Please focus on decreasing your salt intake and see information below. Continue your blood pressure medications as currently prescribed and follow up in 2 weeks with your blood pressure readings and cuff as discussed.   DASH Eating Plan DASH stands for "Dietary Approaches to Stop Hypertension." The DASH eating plan is a healthy eating plan that has been shown to reduce high blood pressure (hypertension). It may also reduce your risk for type 2 diabetes, heart disease, and stroke. The DASH eating plan may also help with weight loss. What are tips for following this plan? General guidelines  Avoid eating more than 2,300 mg (milligrams) of salt (sodium) a day. If you have hypertension, you may need to reduce your sodium intake to 1,500 mg a day.  Limit alcohol intake to no more than 1 drink a day for nonpregnant women and 2 drinks a day for men. One drink equals 12 oz of beer, 5 oz of wine, or 1 oz of hard liquor.  Work with your health care provider to maintain a healthy body weight or to lose weight. Ask what an ideal weight is for you.  Get at least 30 minutes of exercise that causes your heart to beat faster (aerobic exercise) most days of the week. Activities may include walking, swimming, or biking.  Work with your health care provider or diet and nutrition specialist (dietitian) to adjust your eating plan to your individual calorie needs. Reading food labels  Check food labels for the amount of sodium per serving. Choose foods with less than 5 percent of the Daily Value of sodium. Generally, foods with less than 300 mg of sodium per serving fit into this eating plan.  To find whole grains, look for the word "whole" as the first word in the ingredient list. Shopping  Buy products  labeled as "low-sodium" or "no salt added."  Buy fresh foods. Avoid canned foods and premade or frozen meals. Cooking  Avoid adding salt when cooking. Use salt-free seasonings or herbs instead of table salt or sea salt. Check with your health care provider or pharmacist before using salt substitutes.  Do not fry foods. Cook foods using healthy methods such as baking, boiling, grilling, and broiling instead.  Cook with heart-healthy oils, such as olive, canola, soybean, or sunflower oil. Meal planning   Eat a balanced diet that includes: ? 5 or more servings of fruits and vegetables each day. At each meal, try to fill half of your plate with fruits and vegetables. ? Up to 6-8 servings of whole grains each day. ? Less than 6 oz of lean meat, poultry, or fish each day. A 3-oz serving of meat is about the same size as a deck of cards. One egg equals 1 oz. ? 2 servings of low-fat dairy each day. ? A serving of nuts, seeds, or beans 5 times each week. ? Heart-healthy fats. Healthy fats called Omega-3 fatty acids are found in foods such as flaxseeds and coldwater fish, like sardines, salmon, and mackerel.  Limit how much you eat of the following: ? Canned or prepackaged foods. ? Food that is high in trans fat, such as fried foods. ? Food that is high in saturated fat, such as fatty meat. ? Sweets, desserts, sugary drinks, and other foods with added sugar. ? Full-fat  dairy products.  Do not salt foods before eating.  Try to eat at least 2 vegetarian meals each week.  Eat more home-cooked food and less restaurant, buffet, and fast food.  When eating at a restaurant, ask that your food be prepared with less salt or no salt, if possible. What foods are recommended? The items listed may not be a complete list. Talk with your dietitian about what dietary choices are best for you. Grains Whole-grain or whole-wheat bread. Whole-grain or whole-wheat pasta. Brown rice. [place]row. Bulgur.  Whole-grain and low-sodium cereals. Pita bread. Low-fat, low-sodium crackers. Whole-wheat flour tortillas. Vegetables Fresh or frozen vegetables (raw, steamed, roasted, or grilled). Low-sodium or reduced-sodium tomato and vegetable juice. Low-sodium or reduced-sodium tomato sauce and tomato paste. Low-sodium or reduced-sodium canned vegetables. Fruits All fresh, dried, or frozen fruit. Canned fruit in natural juice (without added sugar). Meat and other protein foods Skinless chicken or [place]. Ground chicken or [place]. Pork with fat trimmed off. Fish and seafood. Egg whites. Dried beans, peas, or lentils. Unsalted nuts, nut butters, and seeds. Unsalted canned beans. Lean cuts of beef with fat trimmed off. Low-sodium, lean deli meat. Dairy Low-fat (1%) or fat-free (skim) milk. Fat-free, low-fat, or reduced-fat cheeses. Nonfat, low-sodium ricotta or cottage cheese. Low-fat or nonfat yogurt. Low-fat, low-sodium cheese. Fats and oils Soft margarine without trans fats. Vegetable oil. Low-fat, reduced-fat, or light mayonnaise and salad dressings (reduced-sodium). Canola, safflower, olive, soybean, and sunflower oils. Avocado. Seasoning and other foods Herbs. Spices. Seasoning mixes without salt. Unsalted popcorn and pretzels. Fat-free sweets. What foods are not recommended? The items listed may not be a complete list. Talk with your dietitian about what dietary choices are best for you. Grains Baked goods made with fat, such as croissants, muffins, or some breads. Dry pasta or rice meal packs. Vegetables Creamed or fried vegetables. Vegetables in a cheese sauce. Regular canned vegetables (not low-sodium or reduced-sodium). Regular canned tomato sauce and paste (not low-sodium or reduced-sodium). Regular tomato and vegetable juice (not low-sodium or reduced-sodium). Angie Fava. Olives. Fruits Canned fruit in a light or heavy syrup. Fried fruit. Fruit in cream or butter sauce. Meat and other protein  foods Fatty cuts of meat. Ribs. Fried meat. Berniece Salines. Sausage. Bologna and other processed lunch meats. Salami. Fatback. Hotdogs. Bratwurst. Salted nuts and seeds. Canned beans with added salt. Canned or smoked fish. Whole eggs or egg yolks. Chicken or [place] with skin. Dairy Whole or 2% milk, cream, and half-and-half. Whole or full-fat cream cheese. Whole-fat or sweetened yogurt. Full-fat cheese. Nondairy creamers. Whipped toppings. Processed cheese and cheese spreads. Fats and oils Butter. Stick margarine. Lard. Shortening. Ghee. Bacon fat. Tropical oils, such as coconut, palm kernel, or palm oil. Seasoning and other foods Salted popcorn and pretzels. Onion salt, garlic salt, seasoned salt, table salt, and sea salt. Worcestershire sauce. Tartar sauce. Barbecue sauce. Teriyaki sauce. Soy sauce, including reduced-sodium. Steak sauce. Canned and packaged gravies. Fish sauce. Oyster sauce. Cocktail sauce. Horseradish that you find on the shelf. Ketchup. Mustard. Meat flavorings and tenderizers. Bouillon cubes. Hot sauce and Tabasco sauce. Premade or packaged marinades. Premade or packaged taco seasonings. Relishes. Regular salad dressings. Where to find more information:  National Heart, Lung, and [place]: https://wilson-eaton.com/  American Heart Association: www.heart.org Summary  The DASH eating plan is a healthy eating plan that has been shown to reduce high blood pressure (hypertension). It may also reduce your risk for type 2 diabetes, heart disease, and stroke.  With the DASH eating plan, you should  limit salt (sodium) intake to 2,300 mg a day. If you have hypertension, you may need to reduce your sodium intake to 1,500 mg a day.  When on the DASH eating plan, aim to eat more fresh fruits and vegetables, whole grains, lean proteins, low-fat dairy, and heart-healthy fats.  Work with your health care provider or diet and nutrition specialist (dietitian) to adjust your eating plan to your  individual calorie needs. This information is not intended to replace advice given to you by your health care provider. Make sure you discuss any questions you have with your health care provider. Document Released: 03/02/2011 Document Revised: 03/06/2016 Document Reviewed: 03/06/2016 Elsevier Interactive Patient Education  2017 [employer]

## 2017-03-26 ENCOUNTER — Other Ambulatory Visit: Payer: Self-pay

## 2017-03-26 MED ORDER — FUROSEMIDE 20 MG PO TABS: 20.0000 mg | ORAL_TABLET | Freq: Every day | ORAL | 0 refills | Status: DC

## 2017-04-21 ENCOUNTER — Other Ambulatory Visit: Payer: Self-pay | Admitting: Family Medicine

## 2017-05-07 ENCOUNTER — Other Ambulatory Visit: Payer: Self-pay

## 2017-05-07 DIAGNOSIS — N183 Chronic kidney disease, stage 3 (moderate): Secondary | ICD-10-CM | POA: Diagnosis not present

## 2017-05-07 DIAGNOSIS — R809 Proteinuria, unspecified: Secondary | ICD-10-CM | POA: Diagnosis not present

## 2017-05-07 DIAGNOSIS — I129 Hypertensive chronic kidney disease with stage 1 through stage 4 chronic kidney disease, or unspecified chronic kidney disease: Secondary | ICD-10-CM | POA: Diagnosis not present

## 2017-05-07 DIAGNOSIS — E1122 Type 2 diabetes mellitus with diabetic chronic kidney disease: Secondary | ICD-10-CM | POA: Diagnosis not present

## 2017-05-07 MED ORDER — AMLODIPINE BESYLATE 10 MG PO TABS: 10.0000 mg | ORAL_TABLET | Freq: Every day | ORAL | 1 refills | Status: DC

## 2017-06-19 ENCOUNTER — Other Ambulatory Visit: Payer: Self-pay | Admitting: Family Medicine

## 2017-06-21 ENCOUNTER — Other Ambulatory Visit (INDEPENDENT_AMBULATORY_CARE_PROVIDER_SITE_OTHER): Payer: PPO

## 2017-06-21 DIAGNOSIS — E559 Vitamin D deficiency, unspecified: Secondary | ICD-10-CM

## 2017-06-22 LAB — VITAMIN D 25 HYDROXY (VIT D DEFICIENCY, FRACTURES): VITD: 28.51 ng/mL — AB (ref 30.00–100.00)

## 2017-06-23 ENCOUNTER — Other Ambulatory Visit: Payer: Self-pay | Admitting: Family Medicine

## 2017-07-03 ENCOUNTER — Other Ambulatory Visit: Payer: Self-pay | Admitting: Family Medicine

## 2017-07-05 ENCOUNTER — Other Ambulatory Visit: Payer: Self-pay

## 2017-07-05 MED ORDER — METOPROLOL TARTRATE 25 MG PO TABS: 25.0000 mg | ORAL_TABLET | Freq: Two times a day (BID) | ORAL | 1 refills | Status: DC

## 2017-07-05 MED ORDER — HYDRALAZINE HCL 25 MG PO TABS: 25.0000 mg | ORAL_TABLET | Freq: Three times a day (TID) | ORAL | 1 refills | Status: DC

## 2017-07-06 ENCOUNTER — Other Ambulatory Visit: Payer: Self-pay | Admitting: Family Medicine

## 2017-07-10 ENCOUNTER — Other Ambulatory Visit: Payer: Self-pay | Admitting: Family Medicine

## 2017-07-13 ENCOUNTER — Encounter (INDEPENDENT_AMBULATORY_CARE_PROVIDER_SITE_OTHER): Payer: PPO

## 2017-07-13 ENCOUNTER — Ambulatory Visit (INDEPENDENT_AMBULATORY_CARE_PROVIDER_SITE_OTHER): Payer: PPO | Admitting: Vascular Surgery

## 2017-07-14 ENCOUNTER — Other Ambulatory Visit: Payer: Self-pay | Admitting: Family Medicine

## 2017-08-10 ENCOUNTER — Ambulatory Visit (INDEPENDENT_AMBULATORY_CARE_PROVIDER_SITE_OTHER): Payer: PPO | Admitting: Vascular Surgery

## 2017-08-10 ENCOUNTER — Encounter (INDEPENDENT_AMBULATORY_CARE_PROVIDER_SITE_OTHER): Payer: PPO

## 2017-08-13 ENCOUNTER — Other Ambulatory Visit: Payer: Self-pay | Admitting: Family Medicine

## 2017-08-21 ENCOUNTER — Emergency Department
Admission: EM | Admit: 2017-08-21 | Discharge: 2017-08-21 | Disposition: A | Discharge status: Home / Self Care | Payer: PPO | Attending: Emergency Medicine | Admitting: Emergency Medicine

## 2017-08-21 ENCOUNTER — Encounter: Payer: Self-pay | Admitting: Emergency Medicine

## 2017-08-21 ENCOUNTER — Emergency Department: Payer: PPO

## 2017-08-21 ENCOUNTER — Other Ambulatory Visit: Payer: Self-pay

## 2017-08-21 DIAGNOSIS — Z794 Long term (current) use of insulin: Secondary | ICD-10-CM | POA: Diagnosis not present

## 2017-08-21 DIAGNOSIS — Y929 Unspecified place or not applicable: Secondary | ICD-10-CM | POA: Insufficient documentation

## 2017-08-21 DIAGNOSIS — E119 Type 2 diabetes mellitus without complications: Secondary | ICD-10-CM | POA: Diagnosis not present

## 2017-08-21 DIAGNOSIS — X509XXA Other and unspecified overexertion or strenuous movements or postures, initial encounter: Secondary | ICD-10-CM | POA: Diagnosis not present

## 2017-08-21 DIAGNOSIS — S39012A Strain of muscle, fascia and tendon of lower back, initial encounter: Secondary | ICD-10-CM | POA: Insufficient documentation

## 2017-08-21 DIAGNOSIS — Z79899 Other long term (current) drug therapy: Secondary | ICD-10-CM | POA: Diagnosis not present

## 2017-08-21 DIAGNOSIS — I10 Essential (primary) hypertension: Secondary | ICD-10-CM | POA: Diagnosis not present

## 2017-08-21 DIAGNOSIS — Z7982 Long term (current) use of aspirin: Secondary | ICD-10-CM | POA: Diagnosis not present

## 2017-08-21 DIAGNOSIS — Y939 Activity, unspecified: Secondary | ICD-10-CM | POA: Diagnosis not present

## 2017-08-21 DIAGNOSIS — S3992XA Unspecified injury of lower back, initial encounter: Secondary | ICD-10-CM | POA: Diagnosis present

## 2017-08-21 DIAGNOSIS — Z7901 Long term (current) use of anticoagulants: Secondary | ICD-10-CM | POA: Insufficient documentation

## 2017-08-21 DIAGNOSIS — M545 Low back pain: Secondary | ICD-10-CM | POA: Diagnosis not present

## 2017-08-21 DIAGNOSIS — Y999 Unspecified external cause status: Secondary | ICD-10-CM | POA: Diagnosis not present

## 2017-08-21 MED ORDER — METHOCARBAMOL 500 MG PO TABS: 500.0000 mg | ORAL_TABLET | Freq: Four times a day (QID) | ORAL | 0 refills | Status: DC

## 2017-08-21 MED ORDER — PREDNISONE 50 MG PO TABS: 50.0000 mg | ORAL_TABLET | Freq: Every day | ORAL | 0 refills | Status: DC

## 2017-08-21 NOTE — ED Notes (Signed)
See triage note  States pain increased with movement

## 2017-08-21 NOTE — ED Provider Notes (Signed)
[facility] Emergency Department Provider Note  ____________________________________________  Time seen: Approximately 4:51 PM  I have reviewed the triage vital signs and the nursing notes.   HISTORY  Chief Complaint Back Pain    HPI Danielle Park is a 67 y.o. female who presents the emergency department complaining of lower back pain.  Patient reports that she was moving heavy furniture about a month ago and has had ongoing lower back pain since.  Patient reports that she does not have any radicular symptoms, no loss of bowel or bladder dysfunction, saddle anesthesia or paresthesias.  Patient does not take any medication for this complaint prior to arrival.  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  Patient's not had any medication for this complaint prior to arrival.  No other complaints at this time.  Patient reports that symptoms are worsened with movement.  She reports that if she leans forward slightly symptoms to improve.  Patient has a history of renal artery stenosis, with one nephrectomy.  Patient reports that remaining kidney is functioning properly.  Patient also has a history of fatty liver disease, ongoing thoracic back pain, hypertension, atherosclerosis, diabetes.  No complaints with chronic medical problems.  Past Medical History:  Diagnosis Date  . Chicken pox   . 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 Congenital single kidney   . Diabetes mellitus   . Hypertension   . UTI (urinary tract infection)     Patient Active Problem List   Diagnosis Date Noted  . Elevated alkaline phosphatase level 08/09/2016  . Fatty liver 07/17/2016  . Thoracic back pain 04/18/2016  . Right ankle pain 04/18/2016  . PVD (peripheral vascular disease) (HCC) 12/31/2015  . 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 Atherosclerosis of native arteries of the extremities with  ulceration (HCC) 12/08/2015  . Pain in limb 12/08/2015  . Hematuria 10/17/2012  . 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 Renal artery stenosis (HCC) 05/22/2012  . Diabetes (HCC) 09/13/2011    Past Surgical History:  Procedure Laterality Date  . CESAREAN SECTION    . PERIPHERAL VASCULAR CATHETERIZATION N/A 01/11/2015   Procedure: Abdominal Aortogram w/Lower Extremity;  Surgeon: Annice Needy, MD;  Location: [facility] INVASIVE CV LAB;  Service: Cardiovascular;  Laterality: N/A;  . PERIPHERAL VASCULAR CATHETERIZATION  01/11/2015   Procedure: Lower Extremity Intervention;  Surgeon: Annice Needy, MD;  Location: [facility] INVASIVE CV LAB;  Service: Cardiovascular;;  . RENAL ARTERY STENT  2014   Dr. Wyn Quaker at [facility]  . VAGINAL DELIVERY      Prior to Admission medications   Medication Sig Start Date End Date Taking? Authorizing Provider  amLODipine (NORVASC) 10 MG tablet Take 1 tablet (10 mg total) by mouth daily. 05/07/17   Glori Luis, MD  aspirin 81 MG tablet Take 81 mg by mouth daily.    [provider]  clopidogrel (PLAVIX) 75 MG tablet TAKE 1 TABLET (75 MG TOTAL) BY MOUTH DAILY. 07/16/17   Glori Luis, MD  furosemide (LASIX) 20 MG tablet TAKE 1 TABLET BY MOUTH EVERY DAY 06/27/17   Glori Luis, MD  glipiZIDE (GLUCOTROL XL) 10 MG 24 hr tablet TAKE ONE TABLET BY MOUTH ONCE DAILY 09/25/16   Glori Luis, MD  hydrALAZINE (APRESOLINE) 25 MG tablet Take 1 tablet (25 mg total) by mouth 3 (three) times daily. 07/05/17   Glori Luis,  MD  Insulin Glargine (LANTUS SOLOSTAR) 100 UNIT/ML Solostar Pen Inject 40 Units into the skin daily at 10 pm. 11/10/16   Glori Luis, MD  Insulin Pen Needle (NOVOFINE) 32G X 6 MM MISC USE AS DIRECTED WITH LEVEMIR PEN 10/06/16   Glori Luis, MD  levothyroxine (SYNTHROID, LEVOTHROID) 75 MCG tablet TAKE ONE TABLET BY MOUTH ONCE DAILY BEFORE BREAKFAST 11/20/16   Glori Luis, MD  levothyroxine (SYNTHROID, LEVOTHROID) 75 MCG  tablet TAKE ONE TABLET BY MOUTH ONCE DAILY BEFORE BREAKFAST 08/13/17   Glori Luis, MD  lisinopril (PRINIVIL,ZESTRIL) 40 MG tablet Take 1 tablet (40 mg total) by mouth daily. 09/18/16   Glori Luis, MD  methocarbamol (ROBAXIN) 500 MG tablet Take 1 tablet (500 mg total) by mouth 4 (four) times daily. 08/21/17   Tayshon Winker, Delorise Royals, PA-C  metoprolol tartrate (LOPRESSOR) 25 MG tablet Take 1 tablet (25 mg total) by mouth 2 (two) times daily. 07/05/17   Glori Luis, MD  predniSONE (DELTASONE) 50 MG tablet Take 1 tablet (50 mg total) by mouth daily with breakfast. 08/21/17   Gaurav Baldree, Delorise Royals, PA-C  rosuvastatin (CRESTOR) 10 MG tablet TAKE 1 TABLET (10 MG TOTAL) BY MOUTH DAILY. 07/16/17   Glori Luis, MD  Vitamin D, Ergocalciferol, (DRISDOL) 50000 units CAPS capsule TAKE 1 CAPSULE (50,000 UNITS TOTAL) BY MOUTH EVERY 7 (SEVEN) DAYS. 07/16/17   Glori Luis, MD    Allergies Patient has no known allergies.  Family History  Problem Relation Age of Onset  . Hypertension Mother   . Diabetes Mother   . Heart disease Mother        s/p CABG x 2  . Hypertension Father   . Cancer Father        lung   . Diabetes Sister   . Heart disease Sister   . Heart disease Sister   . Cancer - Lung Brother   . Cancer Sister        lung  . Heart disease Other   . Diabetes Other     Social History Social History   Tobacco Use  . Smoking status: Never Smoker  . Smokeless tobacco: Never Used  Substance Use Topics  . Alcohol use: No    Alcohol/week: 0.0 oz  . Drug use: No     Review of Systems  Constitutional: No fever/chills Eyes: No visual changes.  Cardiovascular: no chest pain. Respiratory: no cough. No SOB. Gastrointestinal: No abdominal pain.  No nausea, no vomiting.  No diarrhea.  No constipation. Genitourinary: Negative for dysuria. No hematuria Musculoskeletal: Positive for lower back pain Skin: Negative for rash, abrasions, lacerations,  ecchymosis. Neurological: Negative for headaches, focal weakness or numbness. 10-point ROS otherwise negative.  ____________________________________________   PHYSICAL EXAM:  VITAL SIGNS: ED Triage Vitals  Enc Vitals Group     BP 08/21/17 1641 (!) 201/61     Pulse Rate 08/21/17 1641 69     Resp 08/21/17 1641 18     Temp 08/21/17 1641 98 F (36.7 C)     Temp Source 08/21/17 1641 Oral     SpO2 08/21/17 1641 97 %     Weight 08/21/17 1645 200 lb (90.7 kg)     Height 08/21/17 1645  (1.6 m)     Head Circumference --      Peak Flow --      Pain Score 08/21/17 1645 8     Pain Loc --      Pain Edu? --  Excl. in GC? --      Constitutional: Alert and oriented. Well appearing and in no acute distress. Eyes: Conjunctivae are normal. PERRL. EOMI. Head: Atraumatic. Neck: No stridor.    Cardiovascular: Normal rate, regular rhythm. Normal S1 and S2.  Good peripheral circulation. Respiratory: Normal respiratory effort without tachypnea or retractions. Lungs CTAB. Good air entry to the bases with no decreased or absent breath sounds. Gastrointestinal: Bowel sounds 4 quadrants. Soft and nontender to palpation. No guarding or rigidity. No palpable masses. No distention. No CVA tenderness. Musculoskeletal: Full range of motion to all extremities. No gross deformities appreciated. Neurologic:  Normal speech and language. No gross focal neurologic deficits are appreciated.  No tenderness to palpation of the lumbar spine or paraspinal muscle region.  No palpable abnormality or step-off.  No tenderness to palpation bilateral sciatic notches.  Negative straight leg raise bilaterally.  Dorsalis pedis pulse intact bilateral lower extremities.  Sensation intact and equal bilateral lower extremities. Skin:  Skin is warm, dry and intact. No rash noted. Psychiatric: Mood and affect are normal. Speech and behavior are normal. Patient exhibits appropriate insight and  judgement.   ____________________________________________   LABS (all labs ordered are listed, but only abnormal results are displayed)  Labs Reviewed - No data to display ____________________________________________  EKG   ____________________________________________  RADIOLOGY Festus Barren Sakina Briones, personally viewed and evaluated these images (plain radiographs) as part of my medical decision making, as well as reviewing the written report by the radiologist.  Concur with radiologist finding of no acute osseous abnormality to the lumbar spine.  Dg Lumbar Spine Complete  Result Date: 08/21/2017 CLINICAL DATA:  Low back pain EXAM: LUMBAR SPINE - COMPLETE 4+ VIEW COMPARISON:  12/16/2014 FINDINGS: Negative for fracture. Normal alignment. Disc spaces well maintained. No pars defect Atherosclerotic disease. Right iliac artery stent. Right renal artery stent. IMPRESSION: No acute abnormality. Atherosclerotic disease. Electronically Signed   By: Marlan Palau M.D.   On: 08/21/2017 18:35    ____________________________________________    PROCEDURES  Procedure(s) performed:    Procedures    Medications - No data to display   ____________________________________________   INITIAL IMPRESSION / ASSESSMENT AND PLAN / ED COURSE  Pertinent labs & imaging results that were available during my care of the patient were reviewed by me and considered in my medical decision making (see chart for details).  Review of the [place] CSRS was performed in accordance of the [facility] prior to dispensing any controlled drugs.     Patient's diagnosis is consistent with lumbosacral strain.  Patient presents the emergency department with ongoing lower back pain after heavy lifting approximately a month ago.  Exam was reassuring but due to patient's symptoms and history, imaging was obtained.  No evidence of acute osseous abnormality on x-ray.  No further work-up deemed necessary at this time.. Patient  will be discharged home with prescriptions for short course of steroids and muscle relaxer. Patient is to follow up with primary care as needed or otherwise directed. Patient is given ED precautions to return to the ED for any worsening or new symptoms.     ____________________________________________  FINAL CLINICAL IMPRESSION(S) / ED DIAGNOSES  Final diagnoses:  Strain of lumbar region, initial encounter      NEW MEDICATIONS STARTED DURING THIS VISIT:  ED Discharge Orders        Ordered    predniSONE (DELTASONE) 50 MG tablet  Daily with breakfast     08/21/17 1901    methocarbamol (ROBAXIN)  500 MG tablet  4 times daily     08/21/17 1901          This chart was dictated using voice recognition software/Dragon. Despite best efforts to proofread, errors can occur which can change the meaning. Any change was purely unintentional.    Racheal Patches, PA-C 08/21/17 1904    Rockne Menghini, MD 08/22/17 [place] Manners

## 2017-08-21 NOTE — ED Triage Notes (Signed)
Presents with mid to lower back pain  States pain started after litinf something heavy about 1 month ago  Ambulates slowly to room d/t pain

## 2017-08-22 ENCOUNTER — Encounter (INDEPENDENT_AMBULATORY_CARE_PROVIDER_SITE_OTHER): Payer: PPO

## 2017-08-22 ENCOUNTER — Ambulatory Visit (INDEPENDENT_AMBULATORY_CARE_PROVIDER_SITE_OTHER): Payer: PPO | Admitting: Vascular Surgery

## 2017-08-27 ENCOUNTER — Ambulatory Visit (INDEPENDENT_AMBULATORY_CARE_PROVIDER_SITE_OTHER): Payer: PPO | Admitting: Family Medicine

## 2017-08-27 ENCOUNTER — Encounter: Payer: Self-pay | Admitting: Family Medicine

## 2017-08-27 VITALS — BP 130/66 | HR 62 | Temp 98.0°F | Ht 62.0 in | Wt 205.0 lb

## 2017-08-27 DIAGNOSIS — R748 Abnormal levels of other serum enzymes: Secondary | ICD-10-CM

## 2017-08-27 DIAGNOSIS — Z794 Long term (current) use of insulin: Secondary | ICD-10-CM

## 2017-08-27 DIAGNOSIS — E1322 Other specified diabetes mellitus with diabetic chronic kidney disease: Secondary | ICD-10-CM

## 2017-08-27 DIAGNOSIS — I150 Renovascular hypertension: Secondary | ICD-10-CM | POA: Diagnosis not present

## 2017-08-27 DIAGNOSIS — M545 Low back pain, unspecified: Secondary | ICD-10-CM | POA: Insufficient documentation

## 2017-08-27 DIAGNOSIS — E785 Hyperlipidemia, unspecified: Secondary | ICD-10-CM

## 2017-08-27 DIAGNOSIS — N183 Chronic kidney disease, stage 3 (moderate): Secondary | ICD-10-CM

## 2017-08-27 NOTE — Assessment & Plan Note (Signed)
Adequately controlled.  Continue current regimen. 

## 2017-08-27 NOTE — Assessment & Plan Note (Signed)
Recheck this and vitamin D.

## 2017-08-27 NOTE — Patient Instructions (Addendum)
Nice to see you. Please do the exercises for your back.  If is not starting to improve over the next 3 to 4 weeks please let us know. We will check lab work today and contact you with the results.   Back Exercises If you have pain in your back, do these exercises 2-3 times each day or as told by your doctor. When the pain goes away, do the exercises once each day, but repeat the steps more times for each exercise (do more repetitions). If you do not have pain in your back, do these exercises once each day or as told by your doctor. Exercises Single Knee to Chest  Do these steps 3-5 times in a row for each leg: 1. Lie on your back on a firm bed or the floor with your legs stretched out. 2. Bring one knee to your chest. 3. Hold your knee to your chest by grabbing your knee or thigh. 4. Pull on your knee until you feel a gentle stretch in your lower back. 5. Keep doing the stretch for 10-30 seconds. 6. Slowly let go of your leg and straighten it.  Pelvic Tilt  Do these steps 5-10 times in a row: 1. Lie on your back on a firm bed or the floor with your legs stretched out. 2. Bend your knees so they point up to the ceiling. Your feet should be flat on the floor. 3. Tighten your lower belly (abdomen) muscles to press your lower back against the floor. This will make your tailbone point up to the ceiling instead of pointing down to your feet or the floor. 4. Stay in this position for 5-10 seconds while you gently tighten your muscles and breathe evenly.  Cat-Cow  Do these steps until your lower back bends more easily: 1. Get on your hands and knees on a firm surface. Keep your hands under your shoulders, and keep your knees under your hips. You may put padding under your knees. 2. Let your head hang down, and make your tailbone point down to the floor so your lower back is round like the back of a cat. 3. Stay in this position for 5 seconds. 4. Slowly lift your head and make your tailbone  point up to the ceiling so your back hangs low (sags) like the back of a cow. 5. Stay in this position for 5 seconds.  Press-Ups  Do these steps 5-10 times in a row: 1. Lie on your belly (face-down) on the floor. 2. Place your hands near your head, about shoulder-width apart. 3. While you keep your back relaxed and keep your hips on the floor, slowly straighten your arms to raise the top half of your body and lift your shoulders. Do not use your back muscles. To make yourself more comfortable, you may change where you place your hands. 4. Stay in this position for 5 seconds. 5. Slowly return to lying flat on the floor.  Bridges  Do these steps 10 times in a row: 1. Lie on your back on a firm surface. 2. Bend your knees so they point up to the ceiling. Your feet should be flat on the floor. 3. Tighten your butt muscles and lift your butt off of the floor until your waist is almost as high as your knees. If you do not feel the muscles working in your butt and the back of your thighs, slide your feet 1-2 inches farther away from your butt. 4. Stay in this position  for 3-5 seconds. 5. Slowly lower your butt to the floor, and let your butt muscles relax.  If this exercise is too easy, try doing it with your arms crossed over your chest. Belly Crunches  Do these steps 5-10 times in a row: 1. Lie on your back on a firm bed or the floor with your legs stretched out. 2. Bend your knees so they point up to the ceiling. Your feet should be flat on the floor. 3. Cross your arms over your chest. 4. Tip your chin a little bit toward your chest but do not bend your neck. 5. Tighten your belly muscles and slowly raise your chest just enough to lift your shoulder blades a tiny bit off of the floor. 6. Slowly lower your chest and your head to the floor.  Back Lifts Do these steps 5-10 times in a row: 1. Lie on your belly (face-down) with your arms at your sides, and rest your forehead on the  floor. 2. Tighten the muscles in your legs and your butt. 3. Slowly lift your chest off of the floor while you keep your hips on the floor. Keep the back of your head in line with the curve in your back. Look at the floor while you do this. 4. Stay in this position for 3-5 seconds. 5. Slowly lower your chest and your face to the floor.  Contact a doctor if:  Your back pain gets a lot worse when you do an exercise.  Your back pain does not lessen 2 hours after you exercise. If you have any of these problems, stop doing the exercises. Do not do them again unless your doctor says it is okay. Get help right away if:  You have sudden, very bad back pain. If this happens, stop doing the exercises. Do not do them again unless your doctor says it is okay. This information is not intended to replace advice given to you by your health care provider. Make sure you discuss any questions you have with your health care provider. Document Released: 04/15/2010 Document Revised: 08/19/2015 Document Reviewed: 05/07/2014 Elsevier Interactive Patient Education  [employer].

## 2017-08-27 NOTE — Assessment & Plan Note (Signed)
Suspect muscular strain.  NSAIDs are not an option given her renal disease.  She will do exercises at home.  I did offer PT though she deferred.  If not improving consider MRI.

## 2017-08-27 NOTE — Assessment & Plan Note (Signed)
>>  ASSESSMENT AND PLAN FOR CONTROLLED DIABETES MELLITUS TYPE 2 WITH COMPLICATIONS (HCC) WRITTEN ON 08/27/2017  5:14 PM BY SONNENBERG, ERIC G, MD  Seems to be well controlled based on CBGs.  We will check lab work today.  Continue current regimen.  She will set up an appointment with her eye doctor.

## 2017-08-27 NOTE — Assessment & Plan Note (Signed)
Seems to be well controlled based on CBGs.  We will check lab work today.  Continue current regimen.  She will set up an appointment with her eye doctor.

## 2017-08-27 NOTE — Progress Notes (Signed)
  Tommi Rumps, MD Phone: [phone]  Danielle Park is a 67 y.o. female who presents today for f/u.  CC: htn, dm, back pain  HYPERTENSION  Disease Monitoring  Home BP Monitoring 140s/60s Chest pain- no    Exertional dyspnea- no Medications  Compliance-  Taking amlodipine, drowsing, lisinopril, metoprolol  Edema-previously had some though that has improved.  No orthopnea or PND.  DIABETES Disease Monitoring: Blood Sugar ranges-320 on steroids, otherwise 80-137 Polyuria/phagia/dipsia-no      Optho- UTD Medications: Compliance-taking Lantus 40 units, glipizide hypoglycemic symptoms-only if she does not eat very well at work.  She will feel little short of breath and dizzy and her sugar will be around 60.  She will eat and this will improve.  Low back pain: Notes about 6 weeks ago her back started to hurt.  She ended up in the emergency room about a week ago.  X-ray was unremarkable for cause.  Felt to be muscular strain.  Prescribed prednisone and Robaxin.  She did not take the Robaxin.  The prednisone was beneficial.  She notes no numbness, weakness, radiation, incontinence, or saddle anesthesia.  She notes the pain has gotten a little bit worse since coming off the prednisone.   Social History   Tobacco Use  Smoking Status Never Smoker  Smokeless Tobacco Never Used     ROS see history of present illness  Objective  Physical Exam Vitals:   08/27/17 1606  BP: 130/66  Pulse: 62  Temp: 98 F (36.7 C)  SpO2: 99%    BP Readings from Last 3 Encounters:  08/27/17 130/66  08/21/17 (!) 150/63  03/12/17 (!) 148/78   Wt Readings from Last 3 Encounters:  08/27/17 205 lb (93 kg)  08/21/17 200 lb (90.7 kg)  03/12/17 195 lb 8 oz (88.7 kg)    Physical Exam  Constitutional: No distress.  Cardiovascular: Normal rate, regular rhythm and normal heart sounds.  Pulmonary/Chest: Effort normal and breath sounds normal.  Musculoskeletal: She exhibits no edema.  No midline spine  tenderness, no midline spine step-off, mild muscular back tenderness in her lumbar spine, no overlying skin changes, 5/5 strength bilateral quads, hamstrings, plantar flexion, dorsiflexion, sensation light touch intact bilateral lower extremities  Neurological: She is alert.  Skin: Skin is warm and dry. She is not diaphoretic.     Assessment/Plan: Please see individual problem list.  Renovascular hypertension Adequately controlled.  Continue current regimen.  Diabetes ([place]) Seems to be well controlled based on CBGs.  We will check lab work today.  Continue current regimen.  She will set up an appointment with her eye doctor.  Elevated alkaline phosphatase level Recheck this and vitamin D.  Low back pain Suspect muscular strain.  NSAIDs are not an option given her renal disease.  She will do exercises at home.  I did offer PT though she deferred.  If not improving consider MRI.   Orders Placed This Encounter  Procedures  . HgB A1c  . Comp Met (CMET)  . Vitamin D (25 hydroxy)  . Direct LDL    No orders of the defined types were placed in this encounter.    Tommi Rumps, MD [place]

## 2017-08-28 LAB — COMPREHENSIVE METABOLIC PANEL
ALBUMIN: 4 g/dL (ref 3.5–5.2)
ALK PHOS: 114 U/L (ref 39–117)
ALT: 10 U/L (ref 0–35)
AST: 17 U/L (ref 0–37)
BUN: 32 mg/dL — AB (ref 6–23)
CO2: 27 mEq/L (ref 19–32)
CREATININE: 1.35 mg/dL — AB (ref 0.40–1.20)
Calcium: 9.4 mg/dL (ref 8.4–10.5)
Chloride: 97 mEq/L (ref 96–112)
GFR: 41.62 mL/min — ABNORMAL LOW (ref >60.00)
GLUCOSE: 177 mg/dL — AB (ref 70–99)
Potassium: 4.5 mEq/L (ref 3.5–5.1)
SODIUM: 134 meq/L — AB (ref 135–145)
Total Bilirubin: 0.3 mg/dL (ref 0.2–1.2)
Total Protein: 7.4 g/dL (ref 6.0–8.3)

## 2017-08-28 LAB — HEMOGLOBIN A1C: HEMOGLOBIN A1C: 8.3 % — AB (ref 4.6–6.5)

## 2017-08-28 LAB — VITAMIN D 25 HYDROXY (VIT D DEFICIENCY, FRACTURES): VITD: 24.82 ng/mL — AB (ref 30.00–100.00)

## 2017-08-28 LAB — LDL CHOLESTEROL, DIRECT: Direct LDL: 69 mg/dL

## 2017-08-29 ENCOUNTER — Other Ambulatory Visit: Payer: Self-pay | Admitting: Family Medicine

## 2017-08-29 ENCOUNTER — Telehealth: Payer: Self-pay

## 2017-08-29 DIAGNOSIS — N179 Acute kidney failure, unspecified: Secondary | ICD-10-CM

## 2017-08-29 NOTE — Telephone Encounter (Signed)
-----   Message from Glori LuisEric G Sonnenberg, MD sent at 08/29/2017  1:14 PM EDT ----- Please let the patient know that her kidney function is minimally worse than prior.  It appears she might be a little dehydrated.  I would suggest adequate hydration and rechecking later this week or early next week.  Please place an order for a BMP for AKI.  Her cholesterol is well controlled.  Her vitamin D is low.  Please confirm how much vitamin D she is currently taking.  Her A1c is worse at 8.3.  I would like to add an additional medication.  Please see if she has taken metformin in the past and if so why she had to come off of it.  Thanks.

## 2017-08-30 ENCOUNTER — Telehealth: Payer: Self-pay | Admitting: Family Medicine

## 2017-08-30 NOTE — Telephone Encounter (Signed)
Reviewed results and physician's note with patient. She will increase Vit D to 2000 iu daily. She stated she did not want to take additional diabetes medication at this time. I did review Tradjenta adverse reactions with patient which did not include kidney failure (her concern stated she has only one kidney) Stated she would attempt to get her diet and exercise in order to help lower A1c. She has appointment for repeat BMP on 09/03/17.

## 2017-09-03 ENCOUNTER — Other Ambulatory Visit (INDEPENDENT_AMBULATORY_CARE_PROVIDER_SITE_OTHER): Payer: PPO

## 2017-09-03 DIAGNOSIS — N179 Acute kidney failure, unspecified: Secondary | ICD-10-CM | POA: Diagnosis not present

## 2017-09-04 LAB — BASIC METABOLIC PANEL
BUN: 13 mg/dL (ref 6–23)
CHLORIDE: 97 meq/L (ref 96–112)
CO2: 28 mEq/L (ref 19–32)
CREATININE: 1.46 mg/dL — AB (ref 0.40–1.20)
Calcium: 8.7 mg/dL (ref 8.4–10.5)
GFR: 38.02 mL/min — ABNORMAL LOW (ref >60.00)
GLUCOSE: 209 mg/dL — AB (ref 70–99)
POTASSIUM: 3.4 meq/L — AB (ref 3.5–5.1)
Sodium: 134 mEq/L — ABNORMAL LOW (ref 135–145)

## 2017-09-06 ENCOUNTER — Telehealth: Payer: Self-pay

## 2017-09-06 NOTE — Telephone Encounter (Signed)
Patient states it is in her right lower back and hurts when she moves.she states it is the same pain as she had spoken with you before. She has tried tylenol

## 2017-09-06 NOTE — Telephone Encounter (Signed)
Patient notified and states she does not believe physical therapy is what she needs and she states she has a job and no time for physical therapy. She states she just needs something to feel better.

## 2017-09-06 NOTE — Telephone Encounter (Signed)
Unfortunately with an acute muscular back injury there is nothing that I can give her that we will just make it better.  She needs to treat the cause which is a muscle strain likely related to inadequately strengthened core and back muscles and this would be addressed by physical therapy.

## 2017-09-06 NOTE — Telephone Encounter (Signed)
I would suggest we refer her for physical therapy given that it has not been improving.  If she is willing I can place the order.

## 2017-09-06 NOTE — Telephone Encounter (Signed)
fyi

## 2017-09-06 NOTE — Telephone Encounter (Signed)
Copied from CRM [id number]. Topic: General - Other >> Sep 06, 2017  8:54 AM Percival SpanishKennedy, Cheryl W wrote:  Pt call to say Dr Birdie SonsSonnenberg told her to call and let him know if her back does not get any better.She said every time she move she gets a very sharpe pain in her back   [phone]

## 2017-09-07 ENCOUNTER — Other Ambulatory Visit: Payer: Self-pay | Admitting: Family Medicine

## 2017-09-07 NOTE — Telephone Encounter (Signed)
Left message to return call to office PEC may advise patient of MD advice.

## 2017-09-11 ENCOUNTER — Other Ambulatory Visit: Payer: Self-pay | Admitting: Family Medicine

## 2017-09-19 ENCOUNTER — Other Ambulatory Visit: Payer: Self-pay | Admitting: Family Medicine

## 2017-09-24 ENCOUNTER — Other Ambulatory Visit: Payer: Self-pay | Admitting: Family Medicine

## 2017-09-26 ENCOUNTER — Other Ambulatory Visit: Payer: Self-pay | Admitting: Family Medicine

## 2017-10-10 ENCOUNTER — Ambulatory Visit (INDEPENDENT_AMBULATORY_CARE_PROVIDER_SITE_OTHER): Payer: PPO

## 2017-10-10 ENCOUNTER — Ambulatory Visit (INDEPENDENT_AMBULATORY_CARE_PROVIDER_SITE_OTHER): Payer: PPO | Admitting: Vascular Surgery

## 2017-10-10 ENCOUNTER — Encounter (INDEPENDENT_AMBULATORY_CARE_PROVIDER_SITE_OTHER): Payer: Self-pay | Admitting: Vascular Surgery

## 2017-10-10 VITALS — BP 153/66 | HR 56 | Resp 16 | Ht 62.0 in | Wt 199.0 lb

## 2017-10-10 DIAGNOSIS — I739 Peripheral vascular disease, unspecified: Secondary | ICD-10-CM | POA: Diagnosis not present

## 2017-10-10 DIAGNOSIS — N183 Chronic kidney disease, stage 3 (moderate): Secondary | ICD-10-CM | POA: Diagnosis not present

## 2017-10-10 DIAGNOSIS — I701 Atherosclerosis of renal artery: Secondary | ICD-10-CM | POA: Diagnosis not present

## 2017-10-10 DIAGNOSIS — E1322 Other specified diabetes mellitus with diabetic chronic kidney disease: Secondary | ICD-10-CM | POA: Diagnosis not present

## 2017-10-10 DIAGNOSIS — Z794 Long term (current) use of insulin: Secondary | ICD-10-CM | POA: Diagnosis not present

## 2017-10-10 NOTE — Progress Notes (Signed)
Subjective:    Patient ID: Danielle Park, female    DOB: 1950/07/26, 67 y.o.   MRN: [id number] Chief Complaint  Patient presents with  . Follow-up    42month Renal    HPI  [name] is a 67 year old female who is returning for her six month follow up concerning her renal artery stenosis.  Her last renal artery duplex was on 01/12/2017.  Her duplex reveals a patent renal artery stent.  There are elevated velocities however it is likely due to some compensatory flow because the patient has an absent left kidney.  These results are consistent with the duplex done 6 months ago.  Her renal aortic ratio does not suggest any hemodynamically significant renal artery stenosis.  The patient regularly measured her blood pressure, she does not endorse any significant elevations in the previous 6 months.  She has not noticed any reduced function.  She denies any nausea, vomiting, diarrhea, or fever.  Review of Systems  Constitutional: Negative.   HENT: Negative.   Eyes: Negative.   Respiratory: Negative.   Cardiovascular: Negative.   Gastrointestinal: Negative.   Endocrine: Negative.   Genitourinary: Negative.   Musculoskeletal: Negative.   Skin: Negative.   Allergic/Immunologic: Negative.   Neurological: Negative.   Hematological: Negative.   Psychiatric/Behavioral: Negative.         Objective:   Physical Exam  Constitutional: She is oriented to person, place, and time. She appears well-developed and well-nourished.  HENT:  Head: Normocephalic and atraumatic.  Eyes: Conjunctivae and EOM are normal.  Neck: Normal range of motion.  Cardiovascular: Normal rate, regular rhythm and intact distal pulses.  Pulmonary/Chest: Effort normal and breath sounds normal.  Abdominal: Soft. Bowel sounds are normal.  Musculoskeletal: Normal range of motion.  Neurological: She is alert and oriented to person, place, and time.  Skin: Skin is warm and dry.  Psychiatric: She has a normal mood and  affect. Her behavior is normal. Judgment and thought content normal.      BP (!) 153/66 (BP Location: Right Arm)   Pulse (!) 56   Resp 16   Ht 5\' 2"  (1.575 m)   Wt 199 lb (90.3 kg)   BMI 36.40 kg/m   Past Medical History:  Diagnosis Date  . Chicken pox   . 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 Congenital single kidney   . Diabetes mellitus   . Hypertension   . UTI (urinary tract infection)     Social History   Socioeconomic History  . Marital status: Married    Spouse name: Not on file  . Number of children: Not on file  . Years of education: Not on file  . Highest education level: Not on file  Occupational History  . Not on file  Social Needs  . Financial resource strain: Not on file  . Food insecurity:    Worry: Not on file    Inability: Not on file  . Transportation needs:    Medical: Not on file    Non-medical: Not on file  Tobacco Use  . Smoking status: Never Smoker  . Smokeless tobacco: Never Used  Substance and Sexual Activity  . Alcohol use: No    Alcohol/week: 0.0 oz  . Drug use: No  . Sexual activity: Not on file  Lifestyle  . Physical activity:    Days per week: Not on file    Minutes per session: Not on file  . Stress:  Not on file  Relationships  . Social connections:    Talks on phone: Not on file    Gets together: Not on file    Attends religious service: Not on file    Active member of club or organization: Not on file    Attends meetings of clubs or organizations: Not on file    Relationship status: Not on file  . Intimate partner violence:    Fear of current or ex partner: Not on file    Emotionally abused: Not on file    Physically abused: Not on file    Forced sexual activity: Not on file  Other Topics Concern  . Not on file  Social History Narrative   Lives in [place] with husband. 1 dog      Work - Administrator, [employer]   Diet - regular diet   Exercise - occasional, very active at work    Past Surgical  History:  Procedure Laterality Date  . CESAREAN SECTION    . PERIPHERAL VASCULAR CATHETERIZATION N/A 01/11/2015   Procedure: Abdominal Aortogram w/Lower Extremity;  Surgeon: Annice NeedyJason S Dew, MD;  Location: [facility] INVASIVE CV LAB;  Service: Cardiovascular;  Laterality: N/A;  . PERIPHERAL VASCULAR CATHETERIZATION  01/11/2015   Procedure: Lower Extremity Intervention;  Surgeon: Annice NeedyJason S Dew, MD;  Location: [facility] INVASIVE CV LAB;  Service: Cardiovascular;;  . RENAL ARTERY STENT  2014   Dr. Wyn Quakerew [facility]  . VAGINAL DELIVERY      Family History  Problem Relation Age of Onset  . Hypertension Mother   . Diabetes Mother   . Heart disease Mother        s/p CABG x 2  . Hypertension Father   . Cancer Father        lung   . Diabetes Sister   . Heart disease Sister   . Heart disease Sister   . Cancer - Lung Brother   . Cancer Sister        lung  . Heart disease Other   . Diabetes Other     No Known Allergies     Assessment & Plan:   1. Renal artery stenosis (HCC)-Stable Ms.  Goedken's duplex is not greatly changed from her previous duplex 6 months ago.  Her blood pressures have been consistent and stable on current medical regimen.  Her creatinine is slightly elevated however this is being followed by her current PCP Dr. Birdie SonsSonnenberg.  She also endorsed getting ready to go back to see her nephrologist.  At this time we will plan to see Ms. Gantt back in the office in 6 months again to follow-up with her renal artery stenosis.  She is advised to come into the office sooner if she feels that her blood pressures are becoming consistently elevated despite continued medical therapy as well as if she feels that her urine output seems to be decreasing or changing in any way.  We will continue aspirin and Plavix.  - VAS US RENAL ARTERY DUPLEX; Future  2. PVD (peripheral vascular disease) (HCC)-Stable Mr. Earlene PlaterDavis does not endorse any issues with walking or leg pain.  We will continue with  aspirin and Plavix.   3. Diabetes (HCC)-Stable Continue hypoglycemic medications as already ordered, these medications have been reviewed and there are no changes at this time.  Hgb A1C to be monitored as already arranged by primary service    Current Outpatient Medications on File Prior to Visit  Medication Sig Dispense Refill  .  amLODipine (NORVASC) 10 MG tablet Take 1 tablet (10 mg total) by mouth daily. 90 tablet 1  . aspirin 81 MG tablet Take 81 mg by mouth daily.    . cholecalciferol (VITAMIN D) 1000 units tablet Take 1,000 Units by mouth daily.    . clopidogrel (PLAVIX) 75 MG tablet TAKE 1 TABLET (75 MG TOTAL) BY MOUTH DAILY. 60 tablet 0  . furosemide (LASIX) 20 MG tablet TAKE 1 TABLET BY MOUTH EVERY DAY 90 tablet 0  . glipiZIDE (GLUCOTROL XL) 10 MG 24 hr tablet TAKE 1 TABLET BY MOUTH EVERY DAY 90 tablet 3  . hydrALAZINE (APRESOLINE) 25 MG tablet Take 1 tablet (25 mg total) by mouth 3 (three) times daily. 270 tablet 1  . Insulin Glargine (LANTUS SOLOSTAR) 100 UNIT/ML Solostar Pen Inject 40 Units into the skin daily at 10 pm. 15 pen 5  . Insulin Pen Needle (NOVOFINE) 32G X 6 MM MISC USE AS DIRECTED WITH LEVEMIR PEN 100 each 3  . levothyroxine (SYNTHROID, LEVOTHROID) 75 MCG tablet TAKE ONE TABLET BY MOUTH ONCE DAILY BEFORE BREAKFAST 90 tablet 1  . lisinopril (PRINIVIL,ZESTRIL) 40 MG tablet TAKE 1 TABLET BY MOUTH EVERY DAY 90 tablet 1  . metoprolol tartrate (LOPRESSOR) 25 MG tablet Take 1 tablet (25 mg total) by mouth 2 (two) times daily. 180 tablet 1  . rosuvastatin (CRESTOR) 10 MG tablet TAKE 1 TABLET (10 MG TOTAL) BY MOUTH DAILY. 90 tablet 1  . methocarbamol (ROBAXIN) 500 MG tablet Take 1 tablet (500 mg total) by mouth 4 (four) times daily. (Patient not taking: Reported on 08/27/2017) 16 tablet 0   No current facility-administered medications on file prior to visit.       Georgiana Spinner, NP

## 2017-11-05 ENCOUNTER — Other Ambulatory Visit: Payer: Self-pay | Admitting: Family Medicine

## 2017-11-10 ENCOUNTER — Other Ambulatory Visit: Payer: Self-pay | Admitting: Family Medicine

## 2017-11-12 DIAGNOSIS — E1122 Type 2 diabetes mellitus with diabetic chronic kidney disease: Secondary | ICD-10-CM | POA: Diagnosis not present

## 2017-11-12 DIAGNOSIS — R809 Proteinuria, unspecified: Secondary | ICD-10-CM | POA: Diagnosis not present

## 2017-11-12 DIAGNOSIS — I701 Atherosclerosis of renal artery: Secondary | ICD-10-CM | POA: Diagnosis not present

## 2017-11-12 DIAGNOSIS — I129 Hypertensive chronic kidney disease with stage 1 through stage 4 chronic kidney disease, or unspecified chronic kidney disease: Secondary | ICD-10-CM | POA: Diagnosis not present

## 2017-11-12 DIAGNOSIS — N183 Chronic kidney disease, stage 3 (moderate): Secondary | ICD-10-CM | POA: Diagnosis not present

## 2017-11-14 ENCOUNTER — Other Ambulatory Visit: Payer: Self-pay | Admitting: Family Medicine

## 2017-12-04 ENCOUNTER — Other Ambulatory Visit: Payer: Self-pay | Admitting: Family Medicine

## 2017-12-04 DIAGNOSIS — N183 Chronic kidney disease, stage 3 (moderate): Principal | ICD-10-CM

## 2017-12-04 DIAGNOSIS — E1122 Type 2 diabetes mellitus with diabetic chronic kidney disease: Secondary | ICD-10-CM

## 2017-12-04 DIAGNOSIS — E1165 Type 2 diabetes mellitus with hyperglycemia: Principal | ICD-10-CM

## 2017-12-04 DIAGNOSIS — Z794 Long term (current) use of insulin: Principal | ICD-10-CM

## 2017-12-04 DIAGNOSIS — IMO0002 Reserved for concepts with insufficient information to code with codable children: Secondary | ICD-10-CM

## 2017-12-05 ENCOUNTER — Other Ambulatory Visit: Payer: Self-pay | Admitting: Family Medicine

## 2017-12-05 DIAGNOSIS — E1165 Type 2 diabetes mellitus with hyperglycemia: Principal | ICD-10-CM

## 2017-12-05 DIAGNOSIS — IMO0002 Reserved for concepts with insufficient information to code with codable children: Secondary | ICD-10-CM

## 2017-12-05 DIAGNOSIS — Z794 Long term (current) use of insulin: Principal | ICD-10-CM

## 2017-12-05 DIAGNOSIS — E1122 Type 2 diabetes mellitus with diabetic chronic kidney disease: Secondary | ICD-10-CM

## 2017-12-05 DIAGNOSIS — N183 Chronic kidney disease, stage 3 (moderate): Principal | ICD-10-CM

## 2017-12-17 ENCOUNTER — Ambulatory Visit (INDEPENDENT_AMBULATORY_CARE_PROVIDER_SITE_OTHER): Payer: PPO | Admitting: Family Medicine

## 2017-12-17 ENCOUNTER — Telehealth: Payer: Self-pay

## 2017-12-17 ENCOUNTER — Encounter: Payer: Self-pay | Admitting: Family Medicine

## 2017-12-17 VITALS — BP 140/66 | HR 61 | Temp 98.5°F | Ht 62.0 in | Wt 206.4 lb

## 2017-12-17 DIAGNOSIS — E785 Hyperlipidemia, unspecified: Secondary | ICD-10-CM | POA: Diagnosis not present

## 2017-12-17 DIAGNOSIS — E1322 Other specified diabetes mellitus with diabetic chronic kidney disease: Secondary | ICD-10-CM | POA: Diagnosis not present

## 2017-12-17 DIAGNOSIS — N183 Chronic kidney disease, stage 3 unspecified: Secondary | ICD-10-CM

## 2017-12-17 DIAGNOSIS — E1169 Type 2 diabetes mellitus with other specified complication: Secondary | ICD-10-CM | POA: Insufficient documentation

## 2017-12-17 DIAGNOSIS — I150 Renovascular hypertension: Secondary | ICD-10-CM | POA: Diagnosis not present

## 2017-12-17 DIAGNOSIS — Z794 Long term (current) use of insulin: Secondary | ICD-10-CM

## 2017-12-17 DIAGNOSIS — E559 Vitamin D deficiency, unspecified: Secondary | ICD-10-CM | POA: Diagnosis not present

## 2017-12-17 DIAGNOSIS — N1832 Chronic kidney disease, stage 3b: Secondary | ICD-10-CM | POA: Insufficient documentation

## 2017-12-17 MED ORDER — TETANUS-DIPHTH-ACELL PERTUSSIS 5-2.5-18.5 LF-MCG/0.5 IM SUSP: 0.5000 mL | Freq: Once | INTRAMUSCULAR | 0 refills | Status: AC

## 2017-12-17 MED ORDER — PNEUMOCOCCAL 13-VAL CONJ VACC IM SUSP: 0.5000 mL | Freq: Once | INTRAMUSCULAR | 0 refills | Status: AC

## 2017-12-17 NOTE — Assessment & Plan Note (Signed)
>>  ASSESSMENT AND PLAN FOR CONTROLLED DIABETES MELLITUS TYPE 2 WITH COMPLICATIONS (HCC) WRITTEN ON 12/17/2017  6:41 PM BY SONNENBERG, ERIC G, MD  Check A1c.  Continue current regimen.

## 2017-12-17 NOTE — Telephone Encounter (Signed)
Pt is requesting a letter to be excused form jury duty sent to PCP

## 2017-12-17 NOTE — Progress Notes (Signed)
  Danielle AlarEric Sonnenberg, MD Phone: [phone]  Danielle Park is a 67 y.o. female who presents today for f/u.  CC: htn, dm, hld  HYPERTENSION Disease Monitoring: Blood pressure range-133-141/55-66 Chest pain- no      Dyspnea- no Medications: Compliance-amlodipine, hydralazine, lisinopril, metoprolol, Lasix    Edema-chronic, stable, decreases overnight, no orthopnea or PND  DIABETES Disease Monitoring: Blood Sugar ranges-75-114 Polyuria/phagia/dipsia- no      Medications: Compliance-taking glipizide, Lantus 40 units hypoglycemic symptoms- only if she does not eat dinner  HYPERLIPIDEMIA Disease Monitoring: See symptoms for Hypertension Medications: Compliance-taking Crestor right upper quadrant pain- no  Muscle aches- no  CKD stage III: Patient has seen nephrology.  She does have protein in her urine.  Kidney function improved.   Social History   Tobacco Use  Smoking Status Never Smoker  Smokeless Tobacco Never Used     ROS see history of present illness  Objective  Physical Exam Vitals:   12/17/17 1611  BP: 140/66  Pulse: 61  Temp: 98.5 F (36.9 C)  SpO2: 97%    BP Readings from Last 3 Encounters:  12/17/17 140/66  10/10/17 (!) 153/66  08/27/17 130/66   Wt Readings from Last 3 Encounters:  12/17/17 206 lb 6.4 oz (93.6 kg)  10/10/17 199 lb (90.3 kg)  08/27/17 205 lb (93 kg)    Physical Exam  Constitutional: No distress.  Cardiovascular: Normal rate, regular rhythm and normal heart sounds.  Pulmonary/Chest: Effort normal and breath sounds normal.  Musculoskeletal: She exhibits no edema.  Neurological: She is alert.  Skin: Skin is warm and dry. She is not diaphoretic.     Assessment/Plan: Please see individual problem list.  Renovascular hypertension Adequately controlled.  Continue current regimen.  She will continue to follow with vascular surgery for her renal artery stenosis.  Diabetes (HCC) Check A1c.  Continue current  regimen.  Hyperlipidemia Check lipid panel.  Continue Crestor.  CKD (chronic kidney disease), stage III [facility](HCC) She will continue to see nephrology.   Health Maintenance: Tdap and Pneumovax to be gotten at the pharmacy.  She declines getting a mammogram.  Orders Placed This Encounter  Procedures  . Vitamin D (25 hydroxy)  . Lipid panel  . HgB A1c    Meds ordered this encounter  Medications  . Tdap (BOOSTRIX) 5-2.5-18.5 LF-MCG/0.5 injection    Sig: Inject 0.5 mLs into the muscle once for 1 dose.    Dispense:  0.5 mL    Refill:  0  . pneumococcal 13-valent conjugate vaccine (PREVNAR 13) SUSP injection    Sig: Inject 0.5 mLs into the muscle once for 1 dose.    Dispense:  0.5 mL    Refill:  0     Danielle AlarEric Sonnenberg, MD [facility] [place]

## 2017-12-17 NOTE — Patient Instructions (Signed)
Nice to see you. We will check lab work today. Please go to the pharmacy get your tetanus vaccination and pneumonia vaccination.

## 2017-12-17 NOTE — Assessment & Plan Note (Signed)
Adequately controlled.  Continue current regimen.  She will continue to follow with vascular surgery for her renal artery stenosis.

## 2017-12-17 NOTE — Assessment & Plan Note (Signed)
Check lipid panel.  Continue Crestor. 

## 2017-12-17 NOTE — Assessment & Plan Note (Signed)
She will continue to see nephrology.  

## 2017-12-17 NOTE — Telephone Encounter (Signed)
For patient's husband NOT her.

## 2017-12-17 NOTE — Assessment & Plan Note (Signed)
Check A1c.  Continue current regimen. 

## 2017-12-18 LAB — LIPID PANEL
CHOLESTEROL: 139 mg/dL (ref 0–200)
HDL: 50.7 mg/dL (ref >39.00)
LDL Cholesterol: 60 mg/dL (ref 0–99)
NonHDL: 88.24
TRIGLYCERIDES: 142 mg/dL (ref 0.0–149.0)
Total CHOL/HDL Ratio: 3
VLDL: 28.4 mg/dL (ref 0.0–40.0)

## 2017-12-18 LAB — VITAMIN D 25 HYDROXY (VIT D DEFICIENCY, FRACTURES): VITD: 28.48 ng/mL — AB (ref 30.00–100.00)

## 2017-12-18 LAB — HEMOGLOBIN A1C: HEMOGLOBIN A1C: 7.4 % — AB (ref 4.6–6.5)

## 2017-12-26 ENCOUNTER — Other Ambulatory Visit: Payer: Self-pay | Admitting: Family Medicine

## 2017-12-27 NOTE — Telephone Encounter (Signed)
Sent to PCP for approval.  

## 2017-12-29 ENCOUNTER — Other Ambulatory Visit: Payer: Self-pay | Admitting: Family Medicine

## 2018-01-05 ENCOUNTER — Other Ambulatory Visit: Payer: Self-pay | Admitting: Family Medicine

## 2018-01-18 ENCOUNTER — Encounter (INDEPENDENT_AMBULATORY_CARE_PROVIDER_SITE_OTHER): Payer: PPO

## 2018-01-18 ENCOUNTER — Ambulatory Visit (INDEPENDENT_AMBULATORY_CARE_PROVIDER_SITE_OTHER): Payer: PPO | Admitting: Vascular Surgery

## 2018-02-26 ENCOUNTER — Other Ambulatory Visit: Payer: Self-pay | Admitting: Family Medicine

## 2018-03-03 ENCOUNTER — Other Ambulatory Visit: Payer: Self-pay | Admitting: Family Medicine

## 2018-03-11 ENCOUNTER — Other Ambulatory Visit: Payer: Self-pay

## 2018-03-11 MED ORDER — LEVOTHYROXINE SODIUM 75 MCG PO TABS: ORAL_TABLET | ORAL | 1 refills | Status: DC

## 2018-03-11 MED ORDER — CLOPIDOGREL BISULFATE 75 MG PO TABS: 75.0000 mg | ORAL_TABLET | Freq: Every day | ORAL | 0 refills | Status: DC

## 2018-03-26 ENCOUNTER — Ambulatory Visit: Payer: Self-pay

## 2018-03-26 NOTE — Telephone Encounter (Signed)
Call placed to patient who states that her symptoms started December 21st with nasal congestion and drainage. Pt states today she is calling because of her cough.  She states she is wheezing but this is normal for her? She denies lung disease. She states that her mucus is clear white.  She feel that the original cough was caused by sinus drainage. She rates the cough as moderate. She denies fever, headache. She is requesting advice for home care.  Care advice read to patient. Per triage no appointment needed for these symptoms. Pt was advised to call back if symptoms become worse or fever develops.  Reason for Disposition . Cough with cold symptoms (e.g., runny nose, postnasal drip, throat clearing)  Answer Assessment - Initial Assessment Questions 1. ONSET: "When did the cough begin?"      21 december 2. SEVERITY: "How bad is the cough today?"      moderate 3. RESPIRATORY DISTRESS: "Describe your breathing."      wheezing 4. FEVER: "Do you have a fever?" If so, ask: "What is your temperature, how was it measured, and when did it start?"     no 5. SPUTUM: "Describe the color of your sputum" (clear, white, yellow, green)     Clear white 6. HEMOPTYSIS: "Are you coughing up any blood?" If so ask: "How much?" (flecks, streaks, tablespoons, etc.)     no 7. CARDIAC HISTORY: "Do you have any history of heart disease?" (e.g., heart attack, congestive heart failure)      no 8. LUNG HISTORY: "Do you have any history of lung disease?"  (e.g., pulmonary embolus, asthma, emphysema)     no 9. PE RISK FACTORS: "Do you have a history of blood clots?" (or: recent major surgery, recent prolonged travel, bedridden)     no 10. OTHER SYMPTOMS: "Do you have any other symptoms?" (e.g., runny nose, wheezing, chest pain)       Runny nose initally 11. PREGNANCY: "Is there any chance you are pregnant?" "When was your last menstrual period?"       N/A 12. TRAVEL: "Have you traveled out of the country in the last  month?" (e.g., travel history, exposures)       No  Protocols used: COUGH - ACUTE PRODUCTIVE-A-AH

## 2018-04-05 ENCOUNTER — Encounter: Payer: Self-pay | Admitting: Family Medicine

## 2018-04-05 ENCOUNTER — Ambulatory Visit (INDEPENDENT_AMBULATORY_CARE_PROVIDER_SITE_OTHER): Payer: PPO | Admitting: Family Medicine

## 2018-04-05 VITALS — BP 150/68 | HR 66 | Temp 97.7°F | Ht 62.0 in | Wt 206.2 lb

## 2018-04-05 DIAGNOSIS — Z1159 Encounter for screening for other viral diseases: Secondary | ICD-10-CM

## 2018-04-05 DIAGNOSIS — I739 Peripheral vascular disease, unspecified: Secondary | ICD-10-CM | POA: Diagnosis not present

## 2018-04-05 DIAGNOSIS — N183 Chronic kidney disease, stage 3 (moderate): Secondary | ICD-10-CM

## 2018-04-05 DIAGNOSIS — J069 Acute upper respiratory infection, unspecified: Secondary | ICD-10-CM | POA: Diagnosis not present

## 2018-04-05 DIAGNOSIS — I150 Renovascular hypertension: Secondary | ICD-10-CM

## 2018-04-05 DIAGNOSIS — Z794 Long term (current) use of insulin: Secondary | ICD-10-CM

## 2018-04-05 DIAGNOSIS — E1322 Other specified diabetes mellitus with diabetic chronic kidney disease: Secondary | ICD-10-CM

## 2018-04-05 MED ORDER — CLOPIDOGREL BISULFATE 75 MG PO TABS: 75.0000 mg | ORAL_TABLET | Freq: Every day | ORAL | 2 refills | Status: DC

## 2018-04-05 NOTE — Patient Instructions (Signed)
Nice to see you. We will check lab work today and contact you with the results. 

## 2018-04-05 NOTE — Assessment & Plan Note (Signed)
She will see vascular surgery later this month.  Continue Plavix.

## 2018-04-05 NOTE — Assessment & Plan Note (Signed)
>>  ASSESSMENT AND PLAN FOR CONTROLLED DIABETES MELLITUS TYPE 2 WITH COMPLICATIONS (HCC) WRITTEN ON 04/05/2018  5:42 PM BY SONNENBERG, ERIC G, MD  Seems well controlled.  Check lab work.

## 2018-04-05 NOTE — Assessment & Plan Note (Signed)
Well-controlled at home.  Continue current regimen.  She will continue to follow with nephrology.

## 2018-04-05 NOTE — Assessment & Plan Note (Signed)
Improved.  Monitor for recurrence. 

## 2018-04-05 NOTE — Assessment & Plan Note (Signed)
Seems well controlled.  Check lab work.

## 2018-04-05 NOTE — Progress Notes (Signed)
  Tommi Rumps, MD Phone: [phone]  Danielle Park is a 68 y.o. female who presents today for follow-up.  CC: Hypertension, diabetes, PAD, URI  Hypertension: Typically 120s-140s/50s-60s.  Taking amlodipine, Lasix, hydralazine, lisinopril, and metoprolol.  No chest pain, shortness of breath, or edema.  Diabetes: Typically 80-133.  Taking Lantus and glipizide.  No polyuria or polydipsia.  She gets some hypoglycemic symptoms when she does not eat.  She needs and these resolved.  Patient reports she is due for an ophthalmology exam.  PAD: Currently on Plavix.  He does have stents.  She follows with vascular surgery in March.  URI: Patient notes she had sinus congestion and postnasal drip several weeks ago.  No fevers.  She had some wheezing.  Notes this progressively improved and she notes no symptoms currently.  Social History   Tobacco Use  Smoking Status Never Smoker  Smokeless Tobacco Never Used     ROS see history of present illness  Objective  Physical Exam Vitals:   04/05/18 1604  BP: (!) 150/68  Pulse: 66  Temp: 97.7 F (36.5 C)  SpO2: 95%    BP Readings from Last 3 Encounters:  04/05/18 (!) 150/68  12/17/17 140/66  10/10/17 (!) 153/66   Wt Readings from Last 3 Encounters:  04/05/18 206 lb 3.2 oz (93.5 kg)  12/17/17 206 lb 6.4 oz (93.6 kg)  10/10/17 199 lb (90.3 kg)    Physical Exam Constitutional:      General: She is not in acute distress.    Appearance: She is not diaphoretic.  Cardiovascular:     Rate and Rhythm: Normal rate and regular rhythm.     Heart sounds: Normal heart sounds.  Pulmonary:     Effort: Pulmonary effort is normal.     Breath sounds: Normal breath sounds.  Skin:    General: Skin is warm and dry.  Neurological:     Mental Status: She is alert.    Diabetic Foot Exam - Simple   Simple Foot Form Diabetic Foot exam was performed with the following findings:  Yes 04/05/2018  4:21 PM  Visual Inspection No deformities, no  ulcerations, no other skin breakdown bilaterally:  Yes Sensation Testing Intact to touch and monofilament testing bilaterally:  Yes Pulse Check Posterior Tibialis and Dorsalis pulse intact bilaterally:  Yes Comments       Assessment/Plan: Please see individual problem list.  PVD (peripheral vascular disease) ([place]) She will see vascular surgery later this month.  Continue Plavix.  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  Continue current regimen.  She will continue to follow with nephrology.  Diabetes ([place]) Seems well controlled.  Check lab work.  URI (upper respiratory infection) Improved.  Monitor for recurrence.    Orders Placed This Encounter  Procedures  . HgB A1c  . Comp Met (CMET)  . Hepatitis C Antibody    Meds ordered this encounter  Medications  . clopidogrel (PLAVIX) 75 MG tablet    Sig: Take 1 tablet (75 mg total) by mouth daily.    Dispense:  90 tablet    Refill:  2     Tommi Rumps, MD [place]

## 2018-04-06 LAB — COMPREHENSIVE METABOLIC PANEL
AG Ratio: 1.2 (calc) (ref 1.0–2.5)
ALT: 7 U/L (ref 6–29)
AST: 16 U/L (ref 10–35)
Albumin: 4.1 g/dL (ref 3.6–5.1)
Alkaline phosphatase (APISO): 86 U/L (ref 33–130)
BUN/Creatinine Ratio: 17 (calc) (ref 6–22)
BUN: 18 mg/dL (ref 7–25)
CO2: 27 mmol/L (ref 20–32)
Calcium: 9.5 mg/dL (ref 8.6–10.4)
Chloride: 103 mmol/L (ref 98–110)
Creat: 1.06 mg/dL — ABNORMAL HIGH (ref 0.50–0.99)
GLUCOSE: 72 mg/dL (ref 65–99)
Globulin: 3.3 g/dL (calc) (ref 1.9–3.7)
Potassium: 4.5 mmol/L (ref 3.5–5.3)
Sodium: 139 mmol/L (ref 135–146)
Total Bilirubin: 0.4 mg/dL (ref 0.2–1.2)
Total Protein: 7.4 g/dL (ref 6.1–8.1)

## 2018-04-06 LAB — HEMOGLOBIN A1C
Hgb A1c MFr Bld: 7.3 % of total Hgb — ABNORMAL HIGH (ref <5.7)
MEAN PLASMA GLUCOSE: 163 (calc)
eAG (mmol/L): 9 (calc)

## 2018-04-06 LAB — HEPATITIS C ANTIBODY
Hepatitis C Ab: NONREACTIVE
SIGNAL TO CUT-OFF: 0.03 (ref <1.00)

## 2018-04-16 ENCOUNTER — Other Ambulatory Visit (INDEPENDENT_AMBULATORY_CARE_PROVIDER_SITE_OTHER): Payer: Self-pay | Admitting: Vascular Surgery

## 2018-04-16 DIAGNOSIS — Z95820 Peripheral vascular angioplasty status with implants and grafts: Secondary | ICD-10-CM

## 2018-04-16 DIAGNOSIS — I739 Peripheral vascular disease, unspecified: Secondary | ICD-10-CM

## 2018-04-19 ENCOUNTER — Ambulatory Visit (INDEPENDENT_AMBULATORY_CARE_PROVIDER_SITE_OTHER): Payer: PPO

## 2018-04-19 ENCOUNTER — Encounter (INDEPENDENT_AMBULATORY_CARE_PROVIDER_SITE_OTHER): Payer: Self-pay | Admitting: Vascular Surgery

## 2018-04-19 ENCOUNTER — Ambulatory Visit (INDEPENDENT_AMBULATORY_CARE_PROVIDER_SITE_OTHER): Payer: PPO | Admitting: Vascular Surgery

## 2018-04-19 VITALS — BP 136/62 | HR 56 | Resp 16 | Ht 62.0 in | Wt 201.4 lb

## 2018-04-19 DIAGNOSIS — Z95820 Peripheral vascular angioplasty status with implants and grafts: Secondary | ICD-10-CM

## 2018-04-19 DIAGNOSIS — I739 Peripheral vascular disease, unspecified: Secondary | ICD-10-CM

## 2018-04-19 DIAGNOSIS — I701 Atherosclerosis of renal artery: Secondary | ICD-10-CM

## 2018-04-19 DIAGNOSIS — Z794 Long term (current) use of insulin: Secondary | ICD-10-CM | POA: Diagnosis not present

## 2018-04-19 DIAGNOSIS — E119 Type 2 diabetes mellitus without complications: Secondary | ICD-10-CM

## 2018-04-19 DIAGNOSIS — E1322 Other specified diabetes mellitus with diabetic chronic kidney disease: Secondary | ICD-10-CM

## 2018-04-19 DIAGNOSIS — I150 Renovascular hypertension: Secondary | ICD-10-CM | POA: Diagnosis not present

## 2018-04-19 DIAGNOSIS — N183 Chronic kidney disease, stage 3 (moderate): Secondary | ICD-10-CM

## 2018-04-19 NOTE — Progress Notes (Signed)
MRN : [id number]  Danielle Park is a 68 y.o. (1950-04-15) female who presents with chief complaint of  Chief Complaint  Patient presents with  . Follow-up  . Medication Management    pt has ran out of her plavix  .  History of Present Illness: Patient returns today in follow up of multiple vascular issues.  She is doing well today.  She denies any limb threatening claudication, ischemic rest pain, or ulceration. ABIs today are 1.12 on the right and 1.09 on the left with triphasic waveforms.  She has had bilateral intervention in the past and these appear to be patent.  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  Her duplex today shows a widely patent right renal artery and stent with no elevated velocities and a stable kidney size and a known left renal artery occlusion.  Current Outpatient Medications  Medication Sig Dispense Refill  . amLODipine (NORVASC) 10 MG tablet TAKE 1 TABLET BY MOUTH EVERY DAY 90 tablet 1  . aspirin 81 MG tablet Take 81 mg by mouth daily.    . cholecalciferol (VITAMIN D) 1000 units tablet Take 2,000 Units by mouth daily.     . clopidogrel (PLAVIX) 75 MG tablet Take 1 tablet (75 mg total) by mouth daily. 90 tablet 2  . furosemide (LASIX) 20 MG tablet TAKE 1 TABLET BY MOUTH EVERY DAY 90 tablet 1  . glipiZIDE (GLUCOTROL XL) 10 MG 24 hr tablet TAKE 1 TABLET BY MOUTH EVERY DAY 90 tablet 3  . hydrALAZINE (APRESOLINE) 25 MG tablet TAKE 1 TABLET BY MOUTH THREE TIMES A DAY 270 tablet 1  . Insulin Pen Needle (NOVOFINE) 32G X 6 MM MISC USE AS DIRECTED WITH LEVEMIR PEN 100 each 3  . LANTUS SOLOSTAR 100 UNIT/ML Solostar Pen INJECT 40 UNITS INTO THE SKIN DAILY AT 10 PM. 15 pen 5  . levothyroxine (SYNTHROID, LEVOTHROID) 75 MCG tablet TAKE ONE TABLET BY MOUTH ONCE DAILY BEFORE BREAKFAST 90 tablet 1  . lisinopril (PRINIVIL,ZESTRIL) 40 MG tablet TAKE 1  TABLET BY MOUTH EVERY DAY 90 tablet 1  . metoprolol tartrate (LOPRESSOR) 25 MG tablet TAKE 1 TABLET BY MOUTH TWICE A DAY 180 tablet 1  . rosuvastatin (CRESTOR) 10 MG tablet TAKE 1 TABLET (10 MG TOTAL) BY MOUTH DAILY. 90 tablet 1   No current facility-administered medications for this visit.     Past Medical History:  Diagnosis Date  . Chicken pox   . 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 Congenital single kidney   . Diabetes mellitus   . Hypertension   . UTI (urinary tract infection)     Past Surgical History:  Procedure Laterality Date  . CESAREAN SECTION    . PERIPHERAL VASCULAR CATHETERIZATION N/A 01/11/2015   Procedure: Abdominal Aortogram w/Lower Extremity;  Surgeon: Algernon Huxley, MD;  Location: [facility];  Service: Cardiovascular;  Laterality: N/A;  . PERIPHERAL VASCULAR CATHETERIZATION  01/11/2015   Procedure: Lower Extremity Intervention;  Surgeon: Algernon Huxley, MD;  Location: [facility];  Service: Cardiovascular;;  . RENAL ARTERY STENT  2014   Dr. Lucky Cowboy at [facility]  . VAGINAL DELIVERY     Social History  Substance Use Topics  . Smoking status: Never Smoker  . Smokeless tobacco: Not on file  . Alcohol use No  No  IV drug use  Family History       Family History  Problem Relation Age of Onset  . Hypertension Mother   . Diabetes Mother   . Heart disease Mother     s/p CABG x 2  . Hypertension Father   . Cancer Father     lung   . Diabetes Sister   . Heart disease Sister   . Heart disease Sister   . Cancer Sister     colon and ovary?  . Heart disease Other   . Diabetes Other      No Known Allergies   REVIEW OF SYSTEMS(Negative unless checked)  Constitutional: [] ?Weight loss[] ?Fever[] ?Chills Cardiac:[] ?Chest pain[] ?Chest pressure[] ?Palpitations [] ?Shortness of breath when laying flat [] ?Shortness of breath at rest [] ?Shortness of breath with  exertion. Vascular: [x] ?Pain in legs with walking[] ?Pain in legsat rest[] ?Pain in legs when laying flat [x] ?Claudication [] ?Pain in feet when walking [] ?Pain in feet at rest [] ?Pain in feet when laying flat [] ?History of DVT [] ?Phlebitis [] ?Swelling in legs [] ?Varicose veins [] ?Non-healing ulcers Pulmonary: [] ?Uses home oxygen [] ?Productive cough[] ?Hemoptysis [] ?Wheeze [] ?COPD [] ?Asthma Neurologic: [] ?Dizziness [] ?Blackouts [] ?Seizures [] ?History of stroke [] ?History of TIA[] ?Aphasia [] ?Temporary blindness[] ?Dysphagia [] ?Weaknessor numbness in arms [] ?Weakness or numbnessin legs Musculoskeletal: [x] ?Arthritis [] ?Joint swelling [] ?Joint pain [] ?Low back pain Hematologic:[] ?Easy bruising[] ?Easy bleeding [] ?Hypercoagulable state [] ?Anemic  Gastrointestinal:[] ?Blood in stool[] ?Vomiting blood[] ?Gastroesophageal reflux/heartburn[] ?Abdominal pain Genitourinary: [] ?Chronic kidney disease [] ?Difficulturination [] ?Frequenturination [] ?Burning with urination[x] ?Hematuria Skin: [] ?Rashes [] ?Ulcers [] ?Wounds Psychological: [] ?History of anxiety[] ?History of major depression.    Physical Examination  BP 136/62 (BP Location: Right Arm, Patient Position: Sitting, Cuff Size: Normal)   Pulse (!) 56   Resp 16   Ht 5' 2"  (1.575 m)   Wt 201 lb 6.4 oz (91.4 kg)   BMI 36.84 kg/m  Gen:  WD/WN, NAD Head: [name]/AT, No temporalis wasting. Ear/Nose/Throat: Hearing grossly intact, nares w/o erythema or drainage Eyes: Conjunctiva clear. Sclera non-icteric Neck: Supple.  Trachea midline Pulmonary:  Good air movement, no use of accessory muscles.  Cardiac: RRR, no JVD Vascular:  Vessel Right Left  Radial Palpable Palpable                          PT Palpable Palpable  DP Palpable Palpable    Musculoskeletal: M/S 5/5 throughout.  No deformity or atrophy. No edema. Neurologic: Sensation grossly intact in  extremities.  Symmetrical.  Speech is fluent.  Psychiatric: Judgment intact, Mood & affect appropriate for pt's clinical situation. Dermatologic: No rashes or ulcers noted.  No cellulitis or open wounds.       Labs Recent Results (from the past 2160 hour(s))  HgB A1c     Status: Abnormal   Collection Time: 04/05/18  4:35 PM  Result Value Ref Range   Hgb A1c MFr Bld 7.3 (H) <5.7 % of total Hgb    Comment: For someone without known diabetes, a hemoglobin A1c value of 6.5% or greater indicates that they may have  diabetes and this should be confirmed with a follow-up  test. . For someone with known diabetes, a value <7% indicates  that their diabetes is well controlled and a value  greater than or equal to 7% indicates suboptimal  control. A1c targets should be individualized based on  duration of diabetes, age, comorbid conditions, and  other considerations. . Currently, no consensus exists regarding use of hemoglobin A1c for diagnosis of diabetes for children. .    Mean Plasma Glucose 163 (calc)   eAG (mmol/L) 9.0 (calc)  Comp Met (CMET)  Status: Abnormal   Collection Time: 04/05/18  4:35 PM  Result Value Ref Range   Glucose, Bld 72 65 - 99 mg/dL    Comment: .            Fasting reference interval .    BUN 18 7 - 25 mg/dL   Creat 1.06 (H) 0.50 - 0.99 mg/dL    Comment: For patients >45 years of age, the reference limit for Creatinine is approximately 13% higher for people identified as African-American. .    BUN/Creatinine Ratio 17 6 - 22 (calc)   Sodium 139 135 - 146 mmol/L   Potassium 4.5 3.5 - 5.3 mmol/L   Chloride 103 98 - 110 mmol/L   CO2 27 20 - 32 mmol/L   Calcium 9.5 8.6 - 10.4 mg/dL   Total Protein 7.4 6.1 - 8.1 g/dL   Albumin 4.1 3.6 - 5.1 g/dL   Globulin 3.3 1.9 - 3.7 g/dL (calc)   AG Ratio 1.2 1.0 - 2.5 (calc)   Total Bilirubin 0.4 0.2 - 1.2 mg/dL   Alkaline phosphatase (APISO) 86 33 - 130 U/L   AST 16 10 - 35 U/L   ALT 7 6 - 29 U/L    Hepatitis C Antibody     Status: None   Collection Time: 04/05/18  4:39 PM  Result Value Ref Range   Hepatitis C Ab NON-REACTIVE NON-REACTI   SIGNAL TO CUT-OFF 0.03 <1.00    Comment: . HCV antibody was non-reactive. There is no laboratory  evidence of HCV infection. . In most cases, no further action is required. However, if recent HCV exposure is suspected, a test for HCV RNA (test code [id number]) is suggested. . For additional information please refer to http://education.questdiagn[facility].com/faq/FAQ22v1 (This link is being provided for informational/ educational purposes only.) .     Radiology No results found.  Assessment/Plan Renovascular hypertension Blood pressure control significantly improved after intervention  PVD (peripheral vascular disease) (HCC) ABIs today are 1.12 on the right and 1.09 on the left with triphasic waveforms.  She has had bilateral intervention in the past and these appear to be patent.  Continue current medical regimen.  She was recently refilled Plavix by her primary care physician and if we need to call in a refill at any point we are happy to.  Recheck in 1 year  Diabetes ([place]) blood glucose control important in reducing the progression of atherosclerotic disease. Also, involved in wound healing. On appropriate medications.  Renal artery stenosis (HCC) Her duplex today shows a widely patent right renal artery and stent with no elevated velocities and a stable kidney size and a known left renal artery occlusion. She remains a very high risk patient with previous intervention and a solitary kidney.  For this reason, I will keep her on 35-monthinterval follow-ups.  She will continue her Plavix.  I will see her back sooner if problems develop in the interim.    JLeotis Pain MD  04/19/2018 8:50 AM    This note was created with Dragon medical transcription system.  Any errors from dictation are purely unintentional

## 2018-04-19 NOTE — Assessment & Plan Note (Signed)
Her duplex today shows a widely patent right renal artery and stent with no elevated velocities and a stable kidney size and a known left renal artery occlusion. She remains a very high risk patient with previous intervention and a solitary kidney.  For this reason, I will keep her on 7113-month interval follow-ups.  She will continue her Plavix.  I will see her back sooner if problems develop in the interim.

## 2018-04-29 DIAGNOSIS — I701 Atherosclerosis of renal artery: Secondary | ICD-10-CM | POA: Diagnosis not present

## 2018-04-29 DIAGNOSIS — E1122 Type 2 diabetes mellitus with diabetic chronic kidney disease: Secondary | ICD-10-CM | POA: Diagnosis not present

## 2018-04-29 DIAGNOSIS — I129 Hypertensive chronic kidney disease with stage 1 through stage 4 chronic kidney disease, or unspecified chronic kidney disease: Secondary | ICD-10-CM | POA: Diagnosis not present

## 2018-04-29 DIAGNOSIS — R809 Proteinuria, unspecified: Secondary | ICD-10-CM | POA: Diagnosis not present

## 2018-04-29 DIAGNOSIS — N183 Chronic kidney disease, stage 3 (moderate): Secondary | ICD-10-CM | POA: Diagnosis not present

## 2018-04-30 ENCOUNTER — Other Ambulatory Visit: Payer: Self-pay | Admitting: Family Medicine

## 2018-06-18 ENCOUNTER — Other Ambulatory Visit: Payer: Self-pay | Admitting: Family Medicine

## 2018-06-23 ENCOUNTER — Other Ambulatory Visit: Payer: Self-pay | Admitting: Family Medicine

## 2018-06-26 ENCOUNTER — Other Ambulatory Visit: Payer: Self-pay | Admitting: Family Medicine

## 2018-06-30 ENCOUNTER — Other Ambulatory Visit: Payer: Self-pay | Admitting: Family Medicine

## 2018-08-09 ENCOUNTER — Ambulatory Visit: Payer: PPO | Admitting: Family Medicine

## 2018-08-28 ENCOUNTER — Other Ambulatory Visit: Payer: Self-pay | Admitting: Family Medicine

## 2018-09-10 ENCOUNTER — Other Ambulatory Visit: Payer: Self-pay | Admitting: Family Medicine

## 2018-09-19 ENCOUNTER — Other Ambulatory Visit: Payer: Self-pay | Admitting: Family Medicine

## 2018-09-26 ENCOUNTER — Ambulatory Visit (INDEPENDENT_AMBULATORY_CARE_PROVIDER_SITE_OTHER): Payer: PPO

## 2018-09-26 ENCOUNTER — Other Ambulatory Visit: Payer: Self-pay

## 2018-09-26 DIAGNOSIS — Z0000 Encounter for general adult medical examination without abnormal findings: Secondary | ICD-10-CM

## 2018-09-26 DIAGNOSIS — E559 Vitamin D deficiency, unspecified: Secondary | ICD-10-CM

## 2018-09-26 DIAGNOSIS — N183 Chronic kidney disease, stage 3 (moderate): Secondary | ICD-10-CM | POA: Diagnosis not present

## 2018-09-26 DIAGNOSIS — Z794 Long term (current) use of insulin: Secondary | ICD-10-CM

## 2018-09-26 DIAGNOSIS — I150 Renovascular hypertension: Secondary | ICD-10-CM | POA: Diagnosis not present

## 2018-09-26 DIAGNOSIS — E1322 Other specified diabetes mellitus with diabetic chronic kidney disease: Secondary | ICD-10-CM | POA: Diagnosis not present

## 2018-09-26 NOTE — Progress Notes (Signed)
Subjective:   Danielle BeckmannBobbie J Park is a 68 y.o. female who presents for an Initial Medicare Annual Wellness Visit.  Review of Systems    No ROS.  Medicare Wellness Virtual Visit.  Visual/audio telehealth visit, UTA vital signs.   See social history for additional risk factors.    Cardiac Risk Factors include: advanced age (>2855men, 57>65 women);diabetes mellitus     Objective:    Today's Vitals   There is no height or weight on file to calculate BMI.  Advanced Directives 09/26/2018 08/21/2017 01/12/2017 07/13/2016 12/31/2015 01/11/2015  Does Patient Have a Medical Advance Directive? No No No Yes No No  Type of Advance Directive - - - Healthcare Power of Attorney - -  Would patient like information on creating a medical advance directive? No - Patient declined No - Patient declined - - - No - patient declined information    Current Medications (verified) Outpatient Encounter Medications as of 09/26/2018  Medication Sig  . amLODipine (NORVASC) 10 MG tablet TAKE 1 TABLET BY MOUTH EVERY DAY  . aspirin 81 MG tablet Take 81 mg by mouth daily.  . cholecalciferol (VITAMIN D) 1000 units tablet Take 2,000 Units by mouth daily.   . clopidogrel (PLAVIX) 75 MG tablet Take 1 tablet (75 mg total) by mouth daily.  . furosemide (LASIX) 20 MG tablet TAKE 1 TABLET BY MOUTH EVERY DAY  . glipiZIDE (GLUCOTROL XL) 10 MG 24 hr tablet TAKE 1 TABLET BY MOUTH EVERY DAY  . hydrALAZINE (APRESOLINE) 25 MG tablet TAKE 1 TABLET BY MOUTH THREE TIMES A DAY  . Insulin Pen Needle (NOVOFINE) 32G X 6 MM MISC USE AS DIRECTED WITH LEVEMIR PEN. Please reschedule missed appt with PCP soon.  Marland Kitchen. LANTUS SOLOSTAR 100 UNIT/ML Solostar Pen INJECT 40 UNITS INTO THE SKIN DAILY AT 10 PM.  . levothyroxine (SYNTHROID, LEVOTHROID) 75 MCG tablet TAKE ONE TABLET BY MOUTH ONCE DAILY BEFORE BREAKFAST  . lisinopril (ZESTRIL) 40 MG tablet TAKE 1 TABLET BY MOUTH EVERY DAY  . metoprolol tartrate (LOPRESSOR) 25 MG tablet TAKE 1 TABLET BY MOUTH TWICE A  DAY  . rosuvastatin (CRESTOR) 10 MG tablet TAKE 1 TABLET (10 MG TOTAL) BY MOUTH DAILY.   No facility-administered encounter medications on file as of 09/26/2018.     Allergies (verified) Patient has no known allergies.   History: Past Medical History:  Diagnosis Date  . Chicken pox   . 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 Congenital single kidney   . Diabetes mellitus   . Hypertension   . UTI (urinary tract infection)    Past Surgical History:  Procedure Laterality Date  . CESAREAN SECTION    . PERIPHERAL VASCULAR CATHETERIZATION N/A 01/11/2015   Procedure: Abdominal Aortogram w/Lower Extremity;  Surgeon: Annice NeedyJason S Dew, MD;  Location: [facility];  Service: Cardiovascular;  Laterality: N/A;  . PERIPHERAL VASCULAR CATHETERIZATION  01/11/2015   Procedure: Lower Extremity Intervention;  Surgeon: Annice NeedyJason S Dew, MD;  Location: [facility];  Service: Cardiovascular;;  . RENAL ARTERY STENT  2014   Dr. Wyn Quakerew [facility]  . VAGINAL DELIVERY     Family History  Problem Relation Age of Onset  . Hypertension Mother   . Diabetes Mother   . Heart disease Mother        s/p CABG x 2  . Hypertension Father   . Cancer Father        lung   .  Diabetes Sister   . Heart disease Sister   . Heart disease Sister   . Cancer - Lung Brother   . Cancer Sister        lung  . Heart disease Other   . Diabetes Other    Social History   Socioeconomic History  . Marital status: Married    Spouse name: Not on file  . Number of children: Not on file  . Years of education: Not on file  . Highest education level: Not on file  Occupational History  . Not on file  Social Needs  . Financial resource strain: Not hard at all  . Food insecurity    Worry: Never true    Inability: Never true  . Transportation needs    Medical: No    Non-medical: No  Tobacco Use  . Smoking status: Never Smoker  . Smokeless tobacco: Never Used  Substance and  Sexual Activity  . Alcohol use: No    Alcohol/week: 0.0 standard drinks  . Drug use: No  . Sexual activity: Not on file  Lifestyle  . Physical activity    Days per week: 0 days    Minutes per session: Not on file  . Stress: Not at all  Relationships  . Social Musicianconnections    Talks on phone: Not on file    Gets together: Not on file    Attends religious service: Not on file    Active member of club or organization: Not on file    Attends meetings of clubs or organizations: Not on file    Relationship status: Not on file  Other Topics Concern  . Not on file  Social History Narrative   Lives in [place] with husband. 1 dog      Work - Administrator, [employer]   Diet - regular diet   Exercise - occasional, very active at work    Tobacco Counseling Counseling given: Not Answered   Clinical Intake:  Pre-visit preparation completed: Yes        Diabetes: Yes(Followed by pcp)  How often do you need to have someone help you when you read instructions, pamphlets, or other written materials from your doctor or pharmacy?: 1 - Never  Interpreter Needed?: No      Activities of Daily Living In your present state of health, do you have any difficulty performing the following activities: 09/26/2018  Hearing? N  Vision? N  Difficulty concentrating or making decisions? N  Walking or climbing stairs? N  Dressing or bathing? N  Doing errands, shopping? N  Preparing Food and eating ? N  Using the Toilet? N  In the past six months, have you accidently leaked urine? N  Do you have problems with loss of bowel control? N  Managing your Medications? N  Managing your Finances? N  Housekeeping or managing your Housekeeping? N  Some recent data might be hidden     Immunizations and Health Maintenance Immunization History  Administered Date(s) Administered  . Influenza, High Dose Seasonal PF 01/29/2017, 12/09/2017  . Influenza-Unspecified 12/08/2015  . Pneumococcal Conjugate-13 12/30/2017  .  Tdap 12/30/2017   Health Maintenance Due  Topic Date Due  . DEXA SCAN  01/08/2016  . OPHTHALMOLOGY EXAM  02/20/2017    Patient Care Team: Glori LuisSonnenberg, Eric G, MD as PCP - General (Family Medicine)  Indicate any recent Medical Services you may have received from other than [facility] providers in the past year (date may be approximate).     Assessment:  This is a routine wellness examination for Danielle Park.  I connected with patient 09/26/18 at 10:30 AM EDT by an audio enabled telemedicine application and verified that I am speaking with the correct person using two identifiers. Patient stated full name and DOB. Patient gave permission to continue with virtual visit. Patient's location was at home and Nurse's location was at [place].   Health Screenings  Mammogram - 08/2011 Colonoscopy - 01/2014 Glaucoma -none Hearing -demonstrates normal hearing during visit. Hemoglobin A1C - 03/2018 Cholesterol - 11/2017 Dental- UTD Vision- visits within the last 12 months.  Social  Alcohol intake - no       Smoking history- never  Smokers in home? none Illicit drug use? none Exercise - none Diet - regular Sexually Active -not currently BMI- discussed the importance of a healthy diet, water intake and the benefits of aerobic exercise.  Educational material provided.   Safety  Patient feels safe at home- yes Patient does have smoke detectors at home- yes Patient does wear sunscreen or protective clothing when in direct sunlight -yes Patient does wear seat belt when in a moving vehicle -yes Patient drives- yes  Covid-19 precautions and sickness symptoms discussed.   Activities of Daily Living Patient denies needing assistance with: driving, household chores, feeding themselves, getting from bed to chair, getting to the toilet, bathing/showering, dressing, managing money, or preparing meals.  No new identified risk were noted.    Depression Screen Patient denies losing interest in daily  life, feeling hopeless, or crying easily over simple problems.   Medication-taking as directed and without issues.   Fall Screen Patient denies being afraid of falling or falling in the last year.   Memory Screen Patient is alert.  Patient denies difficulty focusing, concentrating or misplacing items. Correctly identified the president of the [place], season and recall. Patient likes to play solitaire for brain stimulation.  Immunizations The following Immunizations were discussed: Influenza, shingles, pneumonia, and tetanus.   Other Providers Patient Care Team: Glori LuisSonnenberg, Eric G, MD as PCP - General (Family Medicine)  Hearing/Vision screen  Hearing Screening   125Hz  250Hz  500Hz  1000Hz  2000Hz  3000Hz  4000Hz  6000Hz  8000Hz   Right ear:           Left ear:           Comments: Patient is able to hear conversational tones without difficulty.  No issues reported.  Vision Screening Comments: Visual acuity not assessed, virtual visit.  They have seen their ophthalmologist in the last 12 months.   Dietary issues and exercise activities discussed: Current Exercise Habits: The patient does not participate in regular exercise at present  Goals      Patient Stated   . Healthy Lifestyle (pt-stated)     Use treadmill more for exercise Eat a healthier diet Monitor blood sugar daily      Depression Screen PHQ 2/9 Scores 09/26/2018 04/05/2018 11/10/2016 09/24/2015 11/11/2012  PHQ - 2 Score 0 0 0 0 0    Fall Risk Fall Risk  09/26/2018 04/05/2018 11/10/2016 09/24/2015 11/11/2012  Falls in the past year? 0 0 No No No  Number falls in past yr: - 0 - - -  Injury with Fall? - 0 - - -   Cognitive Function:     6CIT Screen 09/26/2018  What Year? 0 points  What month? 0 points  What time? 0 points  Count back from 20 0 points  Months in reverse 0 points  Repeat phrase 0 points  Total Score 0  Screening Tests Health Maintenance  Topic Date Due  . DEXA SCAN  01/08/2016  . OPHTHALMOLOGY EXAM   02/20/2017  . MAMMOGRAM  04/06/2019 (Originally 09/12/2013)  . HEMOGLOBIN A1C  10/04/2018  . INFLUENZA VACCINE  10/26/2018  . PNA vac Low Risk Adult (2 of 2 - PPSV23) 12/31/2018  . COLONOSCOPY  02/07/2019  . FOOT EXAM  04/06/2019  . TETANUS/TDAP  12/31/2027  . Hepatitis C Screening  Completed     Plan:    End of life planning; Advanced aging; Advanced directives discussed.  No HCPOA/Living Will.  Additional information declined at this time.  I have personally reviewed and noted the following in the patient's chart:   . Medical and social history . Use of alcohol, tobacco or illicit drugs  . Current medications and supplements . Functional ability and status . Nutritional status . Physical activity . Advanced directives . List of other physicians . Hospitalizations, surgeries, and ER visits in previous 12 months . Vitals . Screenings to include cognitive, depression, and falls . Referrals and appointments  In addition, I have reviewed and discussed with patient certain preventive protocols, quality metrics, and best practice recommendations. A written personalized care plan for preventive services as well as general preventive health recommendations were provided to patient.     Varney Biles, LPN   0/06/5407

## 2018-09-26 NOTE — Patient Instructions (Addendum)
  Ms. Lippert , Thank you for taking time to come for your Medicare Wellness Visit. I appreciate your ongoing commitment to your health goals. Please review the following plan we discussed and let me know if I can assist you in the future.   These are the goals we discussed: Goals      Patient Stated   . Healthy Lifestyle (pt-stated)     Use treadmill more for exercise Eat a healthier diet Monitor blood sugar daily       This is a list of the screening recommended for you and due dates:  Health Maintenance  Topic Date Due  . DEXA scan (bone density measurement)  01/08/2016  . Eye exam for diabetics  02/20/2017  . Mammogram  04/06/2019*  . Hemoglobin A1C  10/04/2018  . Flu Shot  10/26/2018  . Pneumonia vaccines (2 of 2 - PPSV23) 12/31/2018  . Colon Cancer Screening  02/07/2019  . Complete foot exam   04/06/2019  . Tetanus Vaccine  12/31/2027  .  Hepatitis C: One time screening is recommended by Center for Disease Control  (CDC) for  adults born from [birth year] through 1965.   Completed  *Topic was postponed. The date shown is not the original due date.

## 2018-09-27 DIAGNOSIS — E559 Vitamin D deficiency, unspecified: Secondary | ICD-10-CM | POA: Insufficient documentation

## 2018-09-29 ENCOUNTER — Other Ambulatory Visit: Payer: Self-pay | Admitting: Family Medicine

## 2018-09-29 DIAGNOSIS — IMO0002 Reserved for concepts with insufficient information to code with codable children: Secondary | ICD-10-CM

## 2018-09-29 DIAGNOSIS — E1122 Type 2 diabetes mellitus with diabetic chronic kidney disease: Secondary | ICD-10-CM

## 2018-10-02 ENCOUNTER — Other Ambulatory Visit (INDEPENDENT_AMBULATORY_CARE_PROVIDER_SITE_OTHER): Payer: PPO

## 2018-10-02 ENCOUNTER — Other Ambulatory Visit: Payer: Self-pay

## 2018-10-02 DIAGNOSIS — N183 Chronic kidney disease, stage 3 unspecified: Secondary | ICD-10-CM

## 2018-10-02 DIAGNOSIS — I150 Renovascular hypertension: Secondary | ICD-10-CM

## 2018-10-02 DIAGNOSIS — E1322 Other specified diabetes mellitus with diabetic chronic kidney disease: Secondary | ICD-10-CM | POA: Diagnosis not present

## 2018-10-02 DIAGNOSIS — E559 Vitamin D deficiency, unspecified: Secondary | ICD-10-CM | POA: Diagnosis not present

## 2018-10-02 DIAGNOSIS — Z794 Long term (current) use of insulin: Secondary | ICD-10-CM | POA: Diagnosis not present

## 2018-10-02 LAB — VITAMIN D 25 HYDROXY (VIT D DEFICIENCY, FRACTURES): VITD: 24.34 ng/mL — ABNORMAL LOW (ref 30.00–100.00)

## 2018-10-02 LAB — COMPREHENSIVE METABOLIC PANEL
ALT: 7 U/L (ref 0–35)
AST: 15 U/L (ref 0–37)
Albumin: 3.7 g/dL (ref 3.5–5.2)
Alkaline Phosphatase: 89 U/L (ref 39–117)
BUN: 20 mg/dL (ref 6–23)
CO2: 25 mEq/L (ref 19–32)
Calcium: 9.1 mg/dL (ref 8.4–10.5)
Chloride: 104 mEq/L (ref 96–112)
Creatinine, Ser: 1.43 mg/dL — ABNORMAL HIGH (ref 0.40–1.20)
GFR: 36.52 mL/min — ABNORMAL LOW (ref >60.00)
Glucose, Bld: 160 mg/dL — ABNORMAL HIGH (ref 70–99)
Potassium: 4.5 mEq/L (ref 3.5–5.1)
Sodium: 138 mEq/L (ref 135–145)
Total Bilirubin: 0.4 mg/dL (ref 0.2–1.2)
Total Protein: 6.8 g/dL (ref 6.0–8.3)

## 2018-10-02 LAB — HEMOGLOBIN A1C: Hgb A1c MFr Bld: 10.3 % — ABNORMAL HIGH (ref 4.6–6.5)

## 2018-10-02 LAB — LDL CHOLESTEROL, DIRECT: Direct LDL: 59 mg/dL

## 2018-10-04 ENCOUNTER — Encounter: Payer: Self-pay | Admitting: Family Medicine

## 2018-10-04 ENCOUNTER — Ambulatory Visit (INDEPENDENT_AMBULATORY_CARE_PROVIDER_SITE_OTHER): Payer: PPO | Admitting: Family Medicine

## 2018-10-04 ENCOUNTER — Other Ambulatory Visit: Payer: Self-pay

## 2018-10-04 DIAGNOSIS — N183 Chronic kidney disease, stage 3 unspecified: Secondary | ICD-10-CM

## 2018-10-04 DIAGNOSIS — F439 Reaction to severe stress, unspecified: Secondary | ICD-10-CM | POA: Diagnosis not present

## 2018-10-04 DIAGNOSIS — E559 Vitamin D deficiency, unspecified: Secondary | ICD-10-CM

## 2018-10-04 DIAGNOSIS — I150 Renovascular hypertension: Secondary | ICD-10-CM | POA: Diagnosis not present

## 2018-10-04 DIAGNOSIS — E1322 Other specified diabetes mellitus with diabetic chronic kidney disease: Secondary | ICD-10-CM | POA: Diagnosis not present

## 2018-10-04 DIAGNOSIS — F4321 Adjustment disorder with depressed mood: Secondary | ICD-10-CM | POA: Insufficient documentation

## 2018-10-04 DIAGNOSIS — Z794 Long term (current) use of insulin: Secondary | ICD-10-CM | POA: Diagnosis not present

## 2018-10-04 MED ORDER — TRULICITY 0.75 MG/0.5ML ~~LOC~~ SOAJ: 0.7500 mg | SUBCUTANEOUS | 1 refills | Status: DC

## 2018-10-04 MED ORDER — VITAMIN D (ERGOCALCIFEROL) 1.25 MG (50000 UNIT) PO CAPS: 50000.0000 [IU] | ORAL_CAPSULE | ORAL | 0 refills | Status: DC

## 2018-10-04 NOTE — Assessment & Plan Note (Signed)
We will treat with prescription vitamin D supplementation and recheck in 8 weeks.

## 2018-10-04 NOTE — Assessment & Plan Note (Signed)
>>  ASSESSMENT AND PLAN FOR CONTROLLED DIABETES MELLITUS TYPE 2 WITH COMPLICATIONS (HCC) WRITTEN ON 10/04/2018  2:22 PM BY SONNENBERG, ERIC G, MD  Uncontrolled.  We will add Trulicity .  She will continue Lantus  and glipizide .  She denies personal or family history of thyroid  cancer, parathyroid  cancer, and adrenal gland cancer.  She will monitor for signs of thyroid  enlargement once starting this medication.

## 2018-10-04 NOTE — Progress Notes (Signed)
Virtual Visit via video Note  This visit type was conducted due to national recommendations for restrictions regarding the COVID-19 pandemic (e.g. social distancing).  This format is felt to be most appropriate for this patient at this time.  All issues noted in this document were discussed and addressed.  No physical exam was performed (except for noted visual exam findings with Video Visits).   I connected with Danielle Park today at  1:45 PM EDT by a video enabled telemedicine application and verified that I am speaking with the correct person using two identifiers. Location patient: home Location provider: work  Persons participating in the virtual visit: patient, provider, Analisse Randle (husband)  I discussed the limitations, risks, security and privacy concerns of performing an evaluation and management service by telephone and the availability of in person appointments. I also discussed with the patient that there may be a patient responsible charge related to this service. The patient expressed understanding and agreed to proceed.   Reason for visit: Follow-up.  HPI: Diabetes: Most recent A1c uncontrolled.  She is taking Lantus 40 units daily and glipizide.  No polyuria or polydipsia.  No hypoglycemia.  She notes she has not been eating any differently though she is not as active since she has been out of work with the COVID-19 pandemic.  Hypertension: Typically in the 120s-130s/60s.  Taking amlodipine, Lasix, hydralazine, and lisinopril.  No chest pain or shortness of breath.  She has chronic lower extremity edema that goes down overnight.  Vitamin D deficiency: She is taking vitamin D 2000 international units daily.  Stress: She has been stressed with the COVID-19 pandemic.  It has been hard for her to get used to not having things to do.  She does feel mildly depressed related to this.  She is not sleeping all that well.  No SI.  She tries to stay busy playing solitaire.   ROS:  See pertinent positives and negatives per HPI.  Past Medical History:  Diagnosis Date  . Chicken pox   . 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 Congenital single kidney   . Diabetes mellitus   . Hypertension   . UTI (urinary tract infection)     Past Surgical History:  Procedure Laterality Date  . CESAREAN SECTION    . PERIPHERAL VASCULAR CATHETERIZATION N/A 01/11/2015   Procedure: Abdominal Aortogram w/Lower Extremity;  Surgeon: Algernon Huxley, MD;  Location: [facility];  Service: Cardiovascular;  Laterality: N/A;  . PERIPHERAL VASCULAR CATHETERIZATION  01/11/2015   Procedure: Lower Extremity Intervention;  Surgeon: Algernon Huxley, MD;  Location: [facility];  Service: Cardiovascular;;  . RENAL ARTERY STENT  2014   Dr. Lucky Cowboy at [facility]  . VAGINAL DELIVERY      Family History  Problem Relation Age of Onset  . Hypertension Mother   . Diabetes Mother   . Heart disease Mother        s/p CABG x 2  . Hypertension Father   . Cancer Father        lung   . Diabetes Sister   . Heart disease Sister   . Heart disease Sister   . Cancer - Lung Brother   . Cancer Sister        lung  . Heart disease Other   . Diabetes Other     SOCIAL HX: nonsmoker   Current Outpatient Medications:  .  amLODipine (NORVASC)  10 MG tablet, TAKE 1 TABLET BY MOUTH EVERY DAY, Disp: 90 tablet, Rfl: 1 .  aspirin 81 MG tablet, Take 81 mg by mouth daily., Disp: , Rfl:  .  cholecalciferol (VITAMIN D) 1000 units tablet, Take 2,000 Units by mouth daily. , Disp: , Rfl:  .  clopidogrel (PLAVIX) 75 MG tablet, Take 1 tablet (75 mg total) by mouth daily., Disp: 90 tablet, Rfl: 2 .  furosemide (LASIX) 20 MG tablet, TAKE 1 TABLET BY MOUTH EVERY DAY, Disp: 90 tablet, Rfl: 1 .  glipiZIDE (GLUCOTROL XL) 10 MG 24 hr tablet, TAKE 1 TABLET BY MOUTH EVERY DAY, Disp: 90 tablet, Rfl: 1 .  hydrALAZINE (APRESOLINE) 25 MG tablet, TAKE 1 TABLET BY MOUTH THREE TIMES A DAY,  Disp: 270 tablet, Rfl: 1 .  Insulin Pen Needle (NOVOFINE) 32G X 6 MM MISC, USE AS DIRECTED WITH LEVEMIR PEN. Please reschedule missed appt with PCP soon., Disp: 100 each, Rfl: 3 .  LANTUS SOLOSTAR 100 UNIT/ML Solostar Pen, INJECT 40 UNITS INTO THE SKIN DAILY AT 10 PM., Disp: 15 mL, Rfl: 1 .  levothyroxine (SYNTHROID, LEVOTHROID) 75 MCG tablet, TAKE ONE TABLET BY MOUTH ONCE DAILY BEFORE BREAKFAST, Disp: 90 tablet, Rfl: 1 .  lisinopril (ZESTRIL) 40 MG tablet, TAKE 1 TABLET BY MOUTH EVERY DAY, Disp: 90 tablet, Rfl: 1 .  metoprolol tartrate (LOPRESSOR) 25 MG tablet, TAKE 1 TABLET BY MOUTH TWICE A DAY, Disp: 180 tablet, Rfl: 1 .  rosuvastatin (CRESTOR) 10 MG tablet, TAKE 1 TABLET (10 MG TOTAL) BY MOUTH DAILY., Disp: 90 tablet, Rfl: 1 .  Dulaglutide (TRULICITY) 0.75 MG/0.5ML SOPN, Inject 0.75 mg into the skin once a week., Disp: 12 pen, Rfl: 1 .  Vitamin D, Ergocalciferol, (DRISDOL) 1.25 MG (50000 UT) CAPS capsule, Take 1 capsule (50,000 Units total) by mouth every 7 (seven) days., Disp: 8 capsule, Rfl: 0  EXAM:  VITALS per patient if applicable: none  GENERAL: alert, oriented, appears well and in no acute distress  HEENT: atraumatic, conjunttiva clear, no obvious abnormalities on inspection of external nose and ears  NECK: normal movements of the head and neck  LUNGS: on inspection no signs of respiratory distress, breathing rate appears normal, no obvious gross SOB, gasping or wheezing  CV: no obvious cyanosis  MS: moves all visible extremities without noticeable abnormality  PSYCH/NEURO: pleasant and cooperative, speech and thought processing grossly intact  ASSESSMENT AND PLAN:  Discussed the following assessment and plan:  Renovascular hypertension Well-controlled.  Continue current medications.  Diabetes (HCC) Uncontrolled.  We will add Trulicity.  She will continue Lantus and glipizide.  She denies personal or family history of thyroid cancer, parathyroid cancer, and adrenal  gland cancer.  She will monitor for signs of thyroid enlargement once starting this medication.  CKD (chronic kidney disease), stage III (HCC) Relatively stable from her values a year ago.  She has an appointment with nephrology coming up and she will keep this.  Vitamin D deficiency We will treat with prescription vitamin D supplementation and recheck in 8 weeks.  Stress Patient with stress and mild depression.  We discussed that we could treat with therapy or medication though she deferred these.  She will monitor and if worsening she will let us know.   Social distancing precautions and sick precautions given regarding COVID-19.   I discussed the assessment and treatment plan with the patient. The patient was provided an opportunity to ask questions and all were answered. The patient agreed with the plan and demonstrated an  understanding of the instructions.   The patient was advised to call back or seek an in-person evaluation if the symptoms worsen or if the condition fails to improve as anticipated.    Marikay AlarEric Kiyan Burmester, MD

## 2018-10-04 NOTE — Assessment & Plan Note (Signed)
Well controlled. Continue current medications  

## 2018-10-04 NOTE — Assessment & Plan Note (Signed)
Uncontrolled.  We will add Trulicity.  She will continue Lantus and glipizide.  She denies personal or family history of thyroid cancer, parathyroid cancer, and adrenal gland cancer.  She will monitor for signs of thyroid enlargement once starting this medication.

## 2018-10-04 NOTE — Assessment & Plan Note (Signed)
Relatively stable from her values a year ago.  She has an appointment with nephrology coming up and she will keep this.

## 2018-10-04 NOTE — Assessment & Plan Note (Signed)
>>  ASSESSMENT AND PLAN FOR STRESS WRITTEN ON 10/04/2018  2:24 PM BY SONNENBERG, ERIC G, MD  Patient with stress and mild depression.  We discussed that we could treat with therapy or medication though she deferred these.  She will monitor and if worsening she will let us  know.

## 2018-10-04 NOTE — Assessment & Plan Note (Signed)
Patient with stress and mild depression.  We discussed that we could treat with therapy or medication though she deferred these.  She will monitor and if worsening she will let us know.

## 2018-10-16 DIAGNOSIS — I129 Hypertensive chronic kidney disease with stage 1 through stage 4 chronic kidney disease, or unspecified chronic kidney disease: Secondary | ICD-10-CM | POA: Diagnosis not present

## 2018-10-16 DIAGNOSIS — E1122 Type 2 diabetes mellitus with diabetic chronic kidney disease: Secondary | ICD-10-CM | POA: Diagnosis not present

## 2018-10-16 DIAGNOSIS — N183 Chronic kidney disease, stage 3 (moderate): Secondary | ICD-10-CM | POA: Diagnosis not present

## 2018-10-16 DIAGNOSIS — I701 Atherosclerosis of renal artery: Secondary | ICD-10-CM | POA: Diagnosis not present

## 2018-10-16 DIAGNOSIS — R809 Proteinuria, unspecified: Secondary | ICD-10-CM | POA: Diagnosis not present

## 2018-10-18 ENCOUNTER — Encounter (INDEPENDENT_AMBULATORY_CARE_PROVIDER_SITE_OTHER): Payer: Self-pay | Admitting: Vascular Surgery

## 2018-10-18 ENCOUNTER — Other Ambulatory Visit: Payer: Self-pay

## 2018-10-18 ENCOUNTER — Ambulatory Visit (INDEPENDENT_AMBULATORY_CARE_PROVIDER_SITE_OTHER): Payer: PPO | Admitting: Vascular Surgery

## 2018-10-18 ENCOUNTER — Ambulatory Visit (INDEPENDENT_AMBULATORY_CARE_PROVIDER_SITE_OTHER): Payer: PPO

## 2018-10-18 VITALS — BP 130/74 | HR 60 | Resp 16 | Wt 206.0 lb

## 2018-10-18 DIAGNOSIS — I150 Renovascular hypertension: Secondary | ICD-10-CM

## 2018-10-18 DIAGNOSIS — Z794 Long term (current) use of insulin: Secondary | ICD-10-CM | POA: Diagnosis not present

## 2018-10-18 DIAGNOSIS — N183 Chronic kidney disease, stage 3 (moderate): Secondary | ICD-10-CM | POA: Diagnosis not present

## 2018-10-18 DIAGNOSIS — E1322 Other specified diabetes mellitus with diabetic chronic kidney disease: Secondary | ICD-10-CM

## 2018-10-18 DIAGNOSIS — I701 Atherosclerosis of renal artery: Secondary | ICD-10-CM

## 2018-10-18 DIAGNOSIS — I10 Essential (primary) hypertension: Secondary | ICD-10-CM

## 2018-10-18 DIAGNOSIS — I739 Peripheral vascular disease, unspecified: Secondary | ICD-10-CM

## 2018-10-18 NOTE — Progress Notes (Signed)
MRN : [id number]  Danielle Park is a 68 y.o. (1950-10-21) female who presents with chief complaint of  Chief Complaint  Patient presents with  . Follow-up    ultrasound follow up  .  History of Present Illness: Patient returns today in follow up of her renal artery stenosis.  She is status post right renal artery intervention for a solitary kidney on 2 occasions.  She is overall doing well.  Her blood pressure control and function have been pretty good.  She has no complaints today.  Duplex shows a widely patent right renal artery stent with no elevated velocities in a stable kidney size with a known left renal artery occlusion.  Current Outpatient Medications  Medication Sig Dispense Refill  . amLODipine (NORVASC) 10 MG tablet TAKE 1 TABLET BY MOUTH EVERY DAY 90 tablet 1  . aspirin 81 MG tablet Take 81 mg by mouth daily.    . clopidogrel (PLAVIX) 75 MG tablet Take 1 tablet (75 mg total) by mouth daily. 90 tablet 2  . Dulaglutide (TRULICITY) 9.67 EL/3.8BO SOPN Inject 0.75 mg into the skin once a week. 12 pen 1  . furosemide (LASIX) 20 MG tablet TAKE 1 TABLET BY MOUTH EVERY DAY 90 tablet 1  . glipiZIDE (GLUCOTROL XL) 10 MG 24 hr tablet TAKE 1 TABLET BY MOUTH EVERY DAY 90 tablet 1  . hydrALAZINE (APRESOLINE) 25 MG tablet TAKE 1 TABLET BY MOUTH THREE TIMES A DAY 270 tablet 1  . Insulin Pen Needle (NOVOFINE) 32G X 6 MM MISC USE AS DIRECTED WITH LEVEMIR PEN. Please reschedule missed appt with PCP soon. 100 each 3  . LANTUS SOLOSTAR 100 UNIT/ML Solostar Pen INJECT 40 UNITS INTO THE SKIN DAILY AT 10 PM. 15 mL 1  . levothyroxine (SYNTHROID, LEVOTHROID) 75 MCG tablet TAKE ONE TABLET BY MOUTH ONCE DAILY BEFORE BREAKFAST 90 tablet 1  . lisinopril (ZESTRIL) 40 MG tablet TAKE 1 TABLET BY MOUTH EVERY DAY 90 tablet 1  . metoprolol tartrate (LOPRESSOR) 25 MG tablet TAKE 1 TABLET BY MOUTH TWICE A DAY 180 tablet 1  . rosuvastatin (CRESTOR) 10 MG tablet TAKE 1 TABLET (10 MG TOTAL) BY MOUTH DAILY. 90  tablet 1  . Vitamin D, Ergocalciferol, (DRISDOL) 1.25 MG (50000 UT) CAPS capsule Take 1 capsule (50,000 Units total) by mouth every 7 (seven) days. 8 capsule 0  . cholecalciferol (VITAMIN D) 1000 units tablet Take 2,000 Units by mouth daily.      No current facility-administered medications for this visit.     Past Medical History:  Diagnosis Date  . Chicken pox   . 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 Congenital single kidney   . Diabetes mellitus   . Hypertension   . UTI (urinary tract infection)     Past Surgical History:  Procedure Laterality Date  . CESAREAN SECTION    . PERIPHERAL VASCULAR CATHETERIZATION N/A 01/11/2015   Procedure: Abdominal Aortogram w/Lower Extremity;  Surgeon: Algernon Huxley, MD;  Location: [facility];  Service: Cardiovascular;  Laterality: N/A;  . PERIPHERAL VASCULAR CATHETERIZATION  01/11/2015   Procedure: Lower Extremity Intervention;  Surgeon: Algernon Huxley, MD;  Location: [facility];  Service: Cardiovascular;;  . RENAL ARTERY STENT  2014   Dr. Lucky Cowboy at [facility]  . VAGINAL DELIVERY      Social History Social History   Tobacco Use  . Smoking status: Never Smoker  . Smokeless  tobacco: Never Used  Substance Use Topics  . Alcohol use: No    Alcohol/week: 0.0 standard drinks  . Drug use: No     Family History Family History  Problem Relation Age of Onset  . Hypertension Mother   . Diabetes Mother   . Heart disease Mother        s/p CABG x 2  . Hypertension Father   . Cancer Father        lung   . Diabetes Sister   . Heart disease Sister   . Heart disease Sister   . Cancer - Lung Brother   . Cancer Sister        lung  . Heart disease Other   . Diabetes Other      No Known Allergies   REVIEW OF SYSTEMS(Negative unless checked)  Constitutional: _0 ??Weight loss_1 ??Fever_2 ??Chills Cardiac:_3 ??Chest pain_4 ??Chest pressure_5 ??Palpitations _6 ??Shortness of  breath when laying flat _7 ??Shortness of breath at rest _8 ??Shortness of breath with exertion. Vascular: _9 ??Pain in legs with walking_10 ??Pain in legsat rest_11 ??Pain in legs when laying flat _12 ??Claudication _13 ??Pain in feet when walking _14 ??Pain in feet at rest _15 ??Pain in feet when laying flat _16 ??History of DVT _17 ??Phlebitis _18 ??Swelling in legs _19 ??Varicose veins _20 ??Non-healing ulcers Pulmonary: _21 ??Uses home oxygen _22 ??Productive cough_23 ??Hemoptysis _24 ??Wheeze _25 ??COPD _26 ??Asthma Neurologic: _27 ??Dizziness _28 ??Blackouts _29 ??Seizures _30 ??History of stroke _31 ??History of TIA_32 ??Aphasia _33 ??Temporary blindness_34 ??Dysphagia _35 ??Weaknessor numbness in arms _36 ??Weakness or numbnessin legs Musculoskeletal: _37 ??Arthritis _38 ??Joint swelling _39 ??Joint pain _40 ??Low back pain Hematologic:_41 ??Easy bruising_42 ??Easy bleeding _43 ??Hypercoagulable state _44 ??Anemic  Gastrointestinal:_45 ??Blood in stool_46 ??Vomiting blood_47 ??Gastroesophageal reflux/heartburn_48 ??Abdominal pain Genitourinary: _49 ??Chronic kidney disease _50 ??Difficulturination _51 ??Frequenturination _52 ??Burning with urination_53 ??Hematuria Skin: _54 ??Rashes _55 ??Ulcers _56 ??Wounds Psychological: _57 ??History of anxiety_58 ??History of major depression.   Physical Examination  BP 130/74 (BP Location: Right Arm)   Pulse 60   Resp 16   Wt 206 lb (93.4 kg)   BMI 37.68 kg/m  Gen:  WD/WN, NAD Head: [place]/AT, No temporalis wasting. Ear/Nose/Throat: Hearing grossly intact, nares w/o erythema or drainage Eyes: Conjunctiva clear. Sclera non-icteric Neck: Supple.  Trachea midline Pulmonary:  Good air movement, no use of accessory muscles.  Cardiac: RRR, no JVD Vascular:  Vessel Right Left  Radial Palpable Palpable                   Musculoskeletal: M/S 5/5 throughout.  No deformity or atrophy. No edema. Neurologic: Sensation grossly intact in  extremities.  Symmetrical.  Speech is fluent.  Psychiatric: Judgment intact, Mood & affect appropriate for pt's clinical situation. Dermatologic: No rashes or ulcers noted.  No cellulitis or open wounds.       Labs Recent Results (from the past 2160 hour(s))  Vitamin D (25 hydroxy)     Status: Abnormal   Collection Time: 10/02/18  9:09 AM  Result Value Ref Range   VITD 24.34 (L) 30.00 - 100.00 ng/mL  Direct LDL     Status: None   Collection Time: 10/02/18  9:09 AM  Result Value Ref Range   Direct LDL 59.0 mg/dL    Comment: Optimal:  <100 mg/dLNear or Above Optimal:  100-129 mg/dLBorderline High:  130-159 mg/dLHigh:  160-189 mg/dLVery High:  >190 mg/dL  Hemoglobin A1c     Status: Abnormal   Collection Time: 10/02/18  9:09 AM  Result Value Ref Range   Hgb A1c MFr Bld 10.3 (H) 4.6 - 6.5 %    Comment: Glycemic Control Guidelines for People with Diabetes:Non Diabetic:  <6%Goal of Therapy: <7%Additional Action Suggested:  >8%   Comp Met (CMET)     Status: Abnormal   Collection Time:  10/02/18  9:09 AM  Result Value Ref Range   Sodium 138 135 - 145 mEq/L   Potassium 4.5 3.5 - 5.1 mEq/L   Chloride 104 96 - 112 mEq/L   CO2 25 19 - 32 mEq/L   Glucose, Bld 160 (H) 70 - 99 mg/dL   BUN 20 6 - 23 mg/dL   Creatinine, Ser 1.43 (H) 0.40 - 1.20 mg/dL   Total Bilirubin 0.4 0.2 - 1.2 mg/dL   Alkaline Phosphatase 89 39 - 117 U/L   AST 15 0 - 37 U/L   ALT 7 0 - 35 U/L   Total Protein 6.8 6.0 - 8.3 g/dL   Albumin 3.7 3.5 - 5.2 g/dL   Calcium 9.1 8.4 - 10.5 mg/dL   GFR 36.52 (L) >60.00 mL/min    Radiology No results found.  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 Also, involved in wound healing. On appropriate medications.  Renal artery stenosis (HCC) Her duplex today shows a widely patent right renal artery and  stent with no elevated velocities and a stable kidney size and a known left renal artery occlusion. She remains a very high risk patient with previous intervention and a solitary kidney.  For this reason, I will keep her on 51-monthinterval follow-ups.  She will continue her Plavix.  I will see her back sooner if problems develop in the interim.    JLeotis Pain MD  10/18/2018 12:26 PM    This note was created with Dragon medical transcription system.  Any errors from dictation are purely unintentional

## 2018-10-22 ENCOUNTER — Encounter: Payer: Self-pay | Admitting: Family Medicine

## 2018-10-22 DIAGNOSIS — E1122 Type 2 diabetes mellitus with diabetic chronic kidney disease: Secondary | ICD-10-CM

## 2018-10-28 ENCOUNTER — Other Ambulatory Visit: Payer: Self-pay | Admitting: Family Medicine

## 2018-10-28 ENCOUNTER — Ambulatory Visit: Payer: PPO | Admitting: Pharmacist

## 2018-10-28 DIAGNOSIS — E1122 Type 2 diabetes mellitus with diabetic chronic kidney disease: Secondary | ICD-10-CM

## 2018-10-28 DIAGNOSIS — Z794 Long term (current) use of insulin: Secondary | ICD-10-CM

## 2018-10-28 MED ORDER — TRULICITY 1.5 MG/0.5ML ~~LOC~~ SOAJ: 1.5000 mg | SUBCUTANEOUS | 2 refills | Status: DC

## 2018-10-28 MED ORDER — BASAGLAR KWIKPEN 100 UNIT/ML ~~LOC~~ SOPN: 30.0000 [IU] | PEN_INJECTOR | Freq: Every day | SUBCUTANEOUS | 2 refills | Status: DC

## 2018-10-28 NOTE — Chronic Care Management (AMB) (Signed)
Chronic Care Management   Note  10/28/2018 Name: Danielle Park MRN: [id number] DOB: [birth year]   Subjective:  Danielle Park is a 68 y.o. year old female who is a primary care patient of Caryl Bis, Angela Adam, MD. The CCM team was consulted for assistance with chronic disease management and care coordination needs.    Contacted patient telephonically today to discuss medication management.   [name] was given information about Chronic Care Management services today including:  1. CCM service includes personalized support from designated clinical staff supervised by her physician, including individualized plan of care and coordination with other care providers 2. 24/7 contact phone numbers for assistance for urgent and routine care needs. 3. Service will only be billed when office clinical staff spend 20 minutes or more in a month to coordinate care. 4. Only one practitioner may furnish and bill the service in a calendar month. 5. The patient may stop CCM services at any time (effective at the end of the month) by phone call to the office staff. 6. The patient will be responsible for cost sharing (co-pay) of up to 20% of the service fee (after annual deductible is met).  Patient agreed to services and verbal consent obtained.   Review of patient status, including review of consultants reports, laboratory and other test data, was performed as part of comprehensive evaluation and provision of chronic care management services.   Objective:  Lab Results  Component Value Date   CREATININE 1.43 (H) 10/02/2018   CREATININE 1.06 (H) 04/05/2018   CREATININE 1.46 (H) 09/03/2017    Lab Results  Component Value Date   HGBA1C 10.3 (H) 10/02/2018       Component Value Date/Time   CHOL 139 12/17/2017 1632   TRIG 142.0 12/17/2017 1632   HDL 50.70 12/17/2017 1632   CHOLHDL 3 12/17/2017 1632   VLDL 28.4 12/17/2017 1632   LDLCALC 60 12/17/2017 1632   LDLDIRECT 59.0 10/02/2018 0909     Clinical ASCVD: No  The 10-year ASCVD risk score Mikey Bussing DC Jr., et al., 2013) is: 15.5%   Values used to calculate the score:     Age: 7 years     Sex: Female     Is Non-Hispanic African American: No     Diabetic: Yes     Tobacco smoker: No     Systolic Blood Pressure: 122 mmHg     Is BP treated: Yes     HDL Cholesterol: 50.7 mg/dL     Total Cholesterol: 139 mg/dL    BP Readings from Last 3 Encounters:  10/18/18 130/74  04/19/18 136/62  04/05/18 (!) 150/68    No Known Allergies  Medications Reviewed Today    Reviewed by De Hollingshead, [place] (Pharmacist) on 10/28/18 at 1344  Med List Status: <None>  Medication Order Taking? Sig Documenting Provider Last Dose Status Informant  amLODipine (NORVASC) 10 MG tablet [id number] Yes TAKE 1 TABLET BY MOUTH EVERY DAY Leone Haven, MD Taking Active            Med Note Mayo Ao Oct 28, 2018  1:42 PM) QAM  aspirin 81 MG tablet [id number] Yes Take 81 mg by mouth daily. [provider] Taking Active         Discontinued 10/28/18 1343 (Change in therapy)   clopidogrel (PLAVIX) 75 MG tablet [id number] Yes Take 1 tablet (75 mg total) by mouth daily. Leone Haven, MD Taking Active   Dulaglutide (TRULICITY) 0.03 KJ/1.7HX  SOPN [id number] Yes Inject 0.75 mg into the skin once a week. Leone Haven, MD Taking Active   furosemide (LASIX) 20 MG tablet [id number] Yes TAKE 1 TABLET BY MOUTH EVERY DAY Leone Haven, MD Taking Active   glipiZIDE (GLUCOTROL XL) 10 MG 24 hr tablet [id number] Yes TAKE 1 TABLET BY MOUTH EVERY DAY Leone Haven, MD Taking Active   hydrALAZINE (APRESOLINE) 25 MG tablet [id number] Yes TAKE 1 TABLET BY MOUTH THREE TIMES A DAY Leone Haven, MD Taking Active            Med Note Mayo Ao Oct 28, 2018  1:43 PM) Taking twice daily   Insulin Pen Needle (NOVOFINE) 32G X 6 MM MISC [id number] Yes USE AS DIRECTED WITH LEVEMIR PEN. Please reschedule missed appt with PCP soon.  Leone Haven, MD Taking Active   LANTUS SOLOSTAR 100 UNIT/ML Solostar Pen [id number] Yes INJECT 40 UNITS INTO THE SKIN DAILY AT 10 PM. Leone Haven, MD Taking Active   levothyroxine (SYNTHROID, LEVOTHROID) 75 MCG tablet [id number] Yes TAKE ONE TABLET BY MOUTH ONCE DAILY BEFORE BREAKFAST Leone Haven, MD Taking Active   lisinopril (ZESTRIL) 40 MG tablet [id number] Yes TAKE 1 TABLET BY MOUTH EVERY DAY Leone Haven, MD Taking Active   metoprolol tartrate (LOPRESSOR) 25 MG tablet [id number] Yes TAKE 1 TABLET BY MOUTH TWICE A DAY Leone Haven, MD Taking Active   rosuvastatin (CRESTOR) 10 MG tablet [id number] Yes TAKE 1 TABLET (10 MG TOTAL) BY MOUTH DAILY. Leone Haven, MD Taking Active   Vitamin D, Ergocalciferol, (DRISDOL) 1.25 MG (50000 UT) CAPS capsule [id number] Yes Take 1 capsule (50,000 Units total) by mouth every 7 (seven) days. Leone Haven, MD Taking Active            Assessment:   Goals Addressed            This Visit's Progress     Patient Stated   . "I want to get my diabetes better controlled" (pt-stated)       Current Barriers:  . Diabetes: uncontrolled; most recent A1c 56.2% o Started Trulicity 5.63 mg ~4 weeks ago; notes a little GI upset when first starting, but this has resolved since o Notes that she has hit the Medicare Coverage Gap and will not be able to afford her copayments for Trulicity and Lantus o Per notes from previous PCP, avoiding metformin use d/t congential single kidney . Current antihyperglycemic regimen: Trulicity 8.93 mg once weekly, Lantus 40 units once daily, glipizide XL 10 mg QAM . Current blood glucose readings: Fasting: 80-110 since starting Trulicity . Cardiovascular risk reduction: o Current hypertensive regimen: lisinopril 40 mg daily, amlodipine 10 mg daily, furosemide 20 mg QAM; BP at goal <140/90 at last office visit o Current hyperlipidemia regimen: rosuvastatin 10 mg daily, LDL well controlled <70 on  last check  Pharmacist Clinical Goal(s):  Marland Kitchen Over the next 90 days, patient with work with PharmD and primary care provider to address optimized medication management . Over the next 30 days, patient will work with PharmD on medication access concerns  Interventions: . Comprehensive medication review performed . Discussed increasing Trulicity to 1.5 mg once weekly to allow for reduced insulin burden. Patient in agreement. Recommend to decrease insulin glargine to 30 units once daily.  . Reviewed income information - patient will qualify for assistance through [employer]. Will apply for Trulicity, and switch Lantus to Basaglar to allow for 1 application  to 1 company. Patient will provide signature and proof of income for application; I will collaborate with primary care provider Dr. Caryl Bis for his signature.   Patient Self Care Activities:  . Patient will check blood glucose daily, document, and provide at future appointments. Will discuss BID checks moving forward . Patient will take medications as prescribed . Patient will contact provider with any episodes of hypoglycemia . Patient will report any questions or concerns to provider   Initial goal documentation        Plan: - Will collaborate with Lyna Poser, CMA to prepare sample Trulicity 1.5 mg for patient - Once all portions of assistance application have been received, will submit and pass along to Susy Frizzle, CPhT for follow up  [place], PharmD, [place] Pharmacist [place] [phone]

## 2018-10-28 NOTE — Patient Instructions (Signed)
Visit Information  Goals Addressed            This Visit's Progress     Patient Stated   . "I want to get my diabetes better controlled" (pt-stated)       Current Barriers:  . 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 Current antihyperglycemic regimen: Trulicity 1.44 mg once weekly, Lantus 40 units once daily, glipizide XL 10 mg QAM . Current blood glucose readings: Fasting: 80-110 since starting Trulicity . Cardiovascular risk reduction: o Current hypertensive regimen: lisinopril 40 mg daily, amlodipine 10 mg daily, furosemide 20 mg QAM; BP at goal <140/90 at last office visit o Current hyperlipidemia regimen: rosuvastatin 10 mg daily, LDL well controlled <70 on last check  Pharmacist Clinical Goal(s):  Marland Kitchen Over the next 90 days, patient with work with PharmD and primary care provider to address optimized medication management . Over the next 30 days, patient will work with PharmD on medication access concerns  Interventions: . Comprehensive medication review performed . Discussed increasing Trulicity to 1.5 mg once weekly to allow for reduced insulin burden. Patient in agreement. Recommend to decrease insulin glargine to 30 units once daily.  . Reviewed income information - patient will qualify for assistance through [employer]. Will apply for Trulicity, and switch Lantus to Basaglar to allow for 1 application to 1 company. Patient will provide signature and proof of income for application; I will collaborate with primary care provider Dr. Caryl Bis for his signature.   Patient Self Care Activities:  . Patient will check blood glucose daily, document, and provide  at future appointments. Will discuss BID checks moving forward . Patient will take medications as prescribed . Patient will contact provider with any episodes of hypoglycemia . Patient will report any questions or concerns to provider   Initial goal documentation        Ms. Rivers was given information about Chronic Care Management services today including:  1. CCM service includes personalized support from designated clinical staff supervised by her physician, including individualized plan of care and coordination with other care providers 2. 24/7 contact phone numbers for assistance for urgent and routine care needs. 3. Service will only be billed when office clinical staff spend 20 minutes or more in a month to coordinate care. 4. Only one practitioner may furnish and bill the service in a calendar month. 5. The patient may stop CCM services at any time (effective at the end of the month) by phone call to the office staff. 6. The patient will be responsible for cost sharing (co-pay) of up to 20% of the service fee (after annual deductible is met).  Patient agreed to services and verbal consent obtained.   The patient verbalized understanding of instructions provided today and declined a print copy of patient instruction materials.   Plan: - Will collaborate with Lyna Poser, CMA to prepare sample Trulicity 1.5 mg for patient - Once all portions of assistance application have been received, will submit and pass along to Susy Frizzle, CPhT for follow up  [place], PharmD, [place] Pharmacist [place] [phone]

## 2018-10-29 ENCOUNTER — Telehealth: Payer: Self-pay

## 2018-10-29 NOTE — Telephone Encounter (Signed)
Labeled 2 boxes of Trulicity for the patient to pick up, I called and left a voicemail informing the patient to pick up medications and gave phone number and hours for pickup.  Nina,cma

## 2018-10-29 NOTE — Telephone Encounter (Signed)
-----   Message from De Hollingshead, [facility] sent at 10/28/2018  3:12 PM EDT ----- Gae Bon - could you pull and label 2 boxes (4 syringes) of Trulicity 1.5 mg once weekly for this patient? Thanks!

## 2018-11-05 ENCOUNTER — Other Ambulatory Visit: Payer: Self-pay | Admitting: Family Medicine

## 2018-11-05 NOTE — Telephone Encounter (Signed)
Based on lab history, pt hasn't had a TSH checked since 2018. Okay to refill?

## 2018-11-07 ENCOUNTER — Other Ambulatory Visit: Payer: Self-pay | Admitting: Family Medicine

## 2018-11-07 ENCOUNTER — Encounter: Payer: Self-pay | Admitting: Family Medicine

## 2018-11-07 DIAGNOSIS — E1122 Type 2 diabetes mellitus with diabetic chronic kidney disease: Secondary | ICD-10-CM

## 2018-11-07 DIAGNOSIS — Z794 Long term (current) use of insulin: Secondary | ICD-10-CM

## 2018-11-19 ENCOUNTER — Ambulatory Visit: Payer: Self-pay | Admitting: Pharmacist

## 2018-11-19 DIAGNOSIS — E1122 Type 2 diabetes mellitus with diabetic chronic kidney disease: Secondary | ICD-10-CM

## 2018-11-19 DIAGNOSIS — Z794 Long term (current) use of insulin: Secondary | ICD-10-CM

## 2018-11-19 NOTE — Chronic Care Management (AMB) (Signed)
Chronic Care Management   Follow Up Note   11/19/2018 Name: Danielle Park MRN: [id number] DOB: [birth year]  Referred by: Glori LuisSonnenberg, Eric G, MD Reason for referral : Chronic Care Management (Medication Management)   Danielle BeckmannBobbie J Tworek is a 68 y.o. year old female who is a primary care patient of Birdie SonsSonnenberg, Yehuda MaoEric G, MD. The CCM team was consulted for assistance with chronic disease management and care coordination needs.   Contacted patient to discuss medication access concerns.    Review of patient status, including review of consultants reports, relevant laboratory and other test results, and collaboration with appropriate care team members and the patient's provider was performed as part of comprehensive patient evaluation and provision of chronic care management services.    SDOH (Social Determinants of Health) screening performed today: Financial Strain . See Care Plan for related entries.   Outpatient Encounter Medications as of 11/19/2018  Medication Sig Note  . amLODipine (NORVASC) 10 MG tablet TAKE 1 TABLET BY MOUTH EVERY DAY   . aspirin 81 MG tablet Take 81 mg by mouth daily.   . clopidogrel (PLAVIX) 75 MG tablet Take 1 tablet (75 mg total) by mouth daily.   . Dulaglutide (TRULICITY) 1.5 MG/0.5ML SOPN Inject 1.5 mg into the skin once a week.   . furosemide (LASIX) 20 MG tablet TAKE 1 TABLET BY MOUTH EVERY DAY   . glipiZIDE (GLUCOTROL XL) 10 MG 24 hr tablet TAKE 1 TABLET BY MOUTH EVERY DAY   . hydrALAZINE (APRESOLINE) 25 MG tablet TAKE 1 TABLET BY MOUTH THREE TIMES A DAY 10/28/2018: Taking twice daily   . Insulin Pen Needle (NOVOFINE) 32G X 6 MM MISC USE AS DIRECTED WITH LEVEMIR PEN. Please reschedule missed appt with PCP soon.   Marland Kitchen. LANTUS SOLOSTAR 100 UNIT/ML Solostar Pen INJECT 40 UNITS INTO THE SKIN DAILY AT 10 PM.   . levothyroxine (SYNTHROID) 75 MCG tablet TAKE ONE TABLET BY MOUTH ONCE DAILY BEFORE BREAKFAST   . lisinopril (ZESTRIL) 40 MG tablet TAKE 1 TABLET BY MOUTH EVERY DAY    . metoprolol tartrate (LOPRESSOR) 25 MG tablet TAKE 1 TABLET BY MOUTH TWICE A DAY   . rosuvastatin (CRESTOR) 10 MG tablet TAKE 1 TABLET (10 MG TOTAL) BY MOUTH DAILY.   Marland Kitchen. Vitamin D, Ergocalciferol, (DRISDOL) 1.25 MG (50000 UT) CAPS capsule Take 1 capsule (50,000 Units total) by mouth every 7 (seven) days.    No facility-administered encounter medications on file as of 11/19/2018.      Goals Addressed            This Visit's Progress     Patient Stated   . "I want to get my diabetes better controlled" (pt-stated)       Current Barriers:  . Diabetes: uncontrolled; most recent A1c 10.3% o Received patient income information. 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 Current antihyperglycemic regimen: Trulicity 0.75 mg once weekly, Lantus 40 units once daily, glipizide XL 10 mg QAM . Current blood glucose readings: Fasting: 80-110 since starting Trulicity . Cardiovascular risk reduction: o Current hypertensive regimen: lisinopril 40 mg daily, amlodipine 10 mg daily, furosemide 20 mg QAM; BP at goal <140/90 at last office visit o Current hyperlipidemia regimen: rosuvastatin  10 mg daily, LDL well controlled <70 on last check  Pharmacist Clinical Goal(s):  Marland Kitchen Over the next 90 days, patient with work with PharmD and primary care provider to address optimized medication management . Over the next 30 days, patient will work with PharmD on medication access concerns  Interventions: . Contacted patient. Noted that I still need her signature on patient portion of application for Trulicity and [employer], [employer]. She will come by clinic tomorrow to sign. Once received, will collaborate with Susy Frizzle, CPhT to submit and follow up  with [place]   Patient Self Care Activities:  . Patient will check blood glucose daily, document, and provide at future appointments. Will discuss BID checks moving forward . Patient will take medications as prescribed . Patient will contact provider with any episodes of hypoglycemia . Patient will report any questions or concerns to provider   Please see past updates related to this goal by clicking on the "Past Updates" button in the selected goal          Plan:  - Will await patient portion of applications  [name], PharmD, [place] Pharmacist [place] [phone]

## 2018-11-19 NOTE — Patient Instructions (Signed)
Visit Information  Goals Addressed            This Visit's Progress     Patient Stated   . "I want to get my diabetes better controlled" (pt-stated)       Current Barriers:  . Diabetes: uncontrolled; most recent A1c 10.3% o Received patient income information. 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 Current antihyperglycemic regimen: Trulicity 6.22 mg once weekly, Lantus 40 units once daily, glipizide XL 10 mg QAM . Current blood glucose readings: Fasting: 80-110 since starting Trulicity . Cardiovascular risk reduction: o Current hypertensive regimen: lisinopril 40 mg daily, amlodipine 10 mg daily, furosemide 20 mg QAM; BP at goal <140/90 at last office visit o Current hyperlipidemia regimen: rosuvastatin 10 mg daily, LDL well controlled <70 on last check  Pharmacist Clinical Goal(s):  Marland Kitchen Over the next 90 days, patient with work with PharmD and primary care provider to address optimized medication management . Over the next 30 days, patient will work with PharmD on medication access concerns  Interventions: . Contacted patient. Noted that I still need her signature on patient portion of application for Trulicity and [employer], [employer]. She will come by clinic tomorrow to sign. Once received, will collaborate with Susy Frizzle, CPhT to submit and follow up with [place]   Patient Self Care Activities:  . Patient will check blood glucose daily, document, and provide at future appointments. Will discuss BID checks moving forward . Patient will take medications as prescribed . Patient will contact provider with any episodes of hypoglycemia . Patient will report any questions or concerns to provider   Please see past  updates related to this goal by clicking on the "Past Updates" button in the selected goal         The patient verbalized understanding of instructions provided today and declined a print copy of patient instruction materials.    Plan:  - Will await patient portion of applications  [name], PharmD, [place] Pharmacist [facility] [place] [phone]

## 2018-11-20 ENCOUNTER — Telehealth: Payer: Self-pay | Admitting: Pharmacist

## 2018-11-20 NOTE — Telephone Encounter (Signed)
Pt was in and signed patient certification form. Form is in [employer].

## 2018-11-21 ENCOUNTER — Other Ambulatory Visit: Payer: Self-pay | Admitting: Pharmacy Technician

## 2018-11-21 ENCOUNTER — Ambulatory Visit: Payer: Self-pay | Admitting: Pharmacist

## 2018-11-21 DIAGNOSIS — E1122 Type 2 diabetes mellitus with diabetic chronic kidney disease: Secondary | ICD-10-CM

## 2018-11-21 NOTE — Patient Instructions (Signed)
Visit Information  Goals Addressed            This Visit's Progress     Patient Stated   . "I want to get my diabetes better controlled" (pt-stated)       Current Barriers:  . Diabetes: uncontrolled; most recent A1c 10.3% o Notes that she has hit the Medicare Coverage Gap and will not be able to afford her copayments for Trulicity and Lantus o Received signed patient portion of application . 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 Current blood glucose readings: Fasting: 80-110 since starting Trulicity . Cardiovascular risk reduction: o Current hypertensive regimen: lisinopril 40 mg daily, amlodipine 10 mg daily, furosemide 20 mg QAM; BP at goal <140/90 at last office visit o Current hyperlipidemia regimen: rosuvastatin 10 mg daily, LDL well controlled <70 on last check  Pharmacist Clinical Goal(s):  Marland Kitchen Over the next 90 days, patient with work with PharmD and primary care provider to address optimized medication management . Over the next 30 days, patient will work with PharmD on medication access concerns  Interventions: . Received all portions of application for Trulicity and Basaglar. Passed materials along to [employer], CPhT for [employer].   Patient Self Care Activities:  . Patient will check blood glucose daily, document, and provide at future appointments. Will discuss BID checks moving forward . Patient will take medications as prescribed . Patient will contact provider with any episodes of hypoglycemia . Patient will report any questions or concerns to provider   Please see past updates related to this goal by clicking on the "Past Updates" button in the selected goal         The patient verbalized understanding of instructions provided today and declined a print copy of patient instruction  materials.  Plan:  - Will collaborate with Susy Frizzle, CPhT for submission and follow up of patient assistance application  [name], PharmD, [place] Pharmacist [place] [phone]

## 2018-11-21 NOTE — Chronic Care Management (AMB) (Signed)
Chronic Care Management   Follow Up Note   11/21/2018 Name: Danielle Park MRN: [id number] DOB: 08/15/1950  Referred by: Leone Haven, MD Reason for referral : Chronic Care Management (Medication Management)   Danielle Park is a 68 y.o. year old female who is a primary care patient of Caryl Bis, Angela Adam, MD. The CCM team was consulted for assistance with chronic disease management and care coordination needs.    Care coordination completed today.  Review of patient status, including review of consultants reports, relevant laboratory and other test results, and collaboration with appropriate care team members and the patient's provider was performed as part of comprehensive patient evaluation and provision of chronic care management services.    SDOH (Social Determinants of Health) screening performed today: Financial Strain . See Care Plan for related entries.   Outpatient Encounter Medications as of 11/21/2018  Medication Sig Note  . amLODipine (NORVASC) 10 MG tablet TAKE 1 TABLET BY MOUTH EVERY DAY   . aspirin 81 MG tablet Take 81 mg by mouth daily.   . clopidogrel (PLAVIX) 75 MG tablet Take 1 tablet (75 mg total) by mouth daily.   . Dulaglutide (TRULICITY) 1.5 LA/4.5XM SOPN Inject 1.5 mg into the skin once a week.   . furosemide (LASIX) 20 MG tablet TAKE 1 TABLET BY MOUTH EVERY DAY   . glipiZIDE (GLUCOTROL XL) 10 MG 24 hr tablet TAKE 1 TABLET BY MOUTH EVERY DAY   . hydrALAZINE (APRESOLINE) 25 MG tablet TAKE 1 TABLET BY MOUTH THREE TIMES A DAY 10/28/2018: Taking twice daily   . Insulin Pen Needle (NOVOFINE) 32G X 6 MM MISC USE AS DIRECTED WITH LEVEMIR PEN. Please reschedule missed appt with PCP soon.   Marland Kitchen LANTUS SOLOSTAR 100 UNIT/ML Solostar Pen INJECT 40 UNITS INTO THE SKIN DAILY AT 10 PM.   . levothyroxine (SYNTHROID) 75 MCG tablet TAKE ONE TABLET BY MOUTH ONCE DAILY BEFORE BREAKFAST   . lisinopril (ZESTRIL) 40 MG tablet TAKE 1 TABLET BY MOUTH EVERY DAY   . metoprolol tartrate  (LOPRESSOR) 25 MG tablet TAKE 1 TABLET BY MOUTH TWICE A DAY   . rosuvastatin (CRESTOR) 10 MG tablet TAKE 1 TABLET (10 MG TOTAL) BY MOUTH DAILY.   Marland Kitchen Vitamin D, Ergocalciferol, (DRISDOL) 1.25 MG (50000 UT) CAPS capsule Take 1 capsule (50,000 Units total) by mouth every 7 (seven) days.    No facility-administered encounter medications on file as of 11/21/2018.      Goals Addressed            This Visit's Progress     Patient Stated   . "I want to get my diabetes better controlled" (pt-stated)       Current Barriers:  . Diabetes: uncontrolled; most recent A1c 10.3% o Notes that she has hit the Medicare Coverage Gap and will not be able to afford her copayments for Trulicity and Lantus o Received signed patient portion of application . 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 Current blood glucose readings: Fasting: 80-110 since starting Trulicity . Cardiovascular risk reduction: o Current hypertensive regimen: lisinopril 40 mg daily, amlodipine 10 mg daily, furosemide 20 mg QAM; BP at goal <140/90 at last office visit o Current hyperlipidemia regimen: rosuvastatin 10 mg daily, LDL well controlled <70 on last check  Pharmacist Clinical Goal(s):  Marland Kitchen Over the next 90 days,  patient with work with PharmD and primary care provider to address optimized medication management . Over the next 30 days, patient will work with PharmD on medication access concerns  Interventions: . Received all portions of application for Trulicity and Basaglar. Passed materials along to [employer]Jill Simcox, CPhT for [employer].   Patient Self Care Activities:  . Patient will check blood glucose daily, document, and provide at future appointments. Will discuss BID checks moving forward . Patient will take medications as prescribed . Patient will contact  provider with any episodes of hypoglycemia . Patient will report any questions or concerns to provider   Please see past updates related to this goal by clicking on the "Past Updates" button in the selected goal          Plan:  - Will collaborate with Pattricia BossJill Simcox, CPhT for submission and follow up of patient assistance application  Catie Feliz Beamravis, PharmD, CPP Clinical Pharmacist [facility] [place] [facility] [phone]

## 2018-11-21 NOTE — Patient Outreach (Signed)
[facility]) Care Management  11/21/2018  Danielle Park February 09, 1951 [id number]    Received all necessary paperwork and signatures from both patient and provider from [employer] [place] for [place] patient assistance for [employer] and [employer].  Submitted completed application to [employer].  Will followup with [name] in 5-7 business days.  Daveion Robar P. Earlyn Sylvan, [place] Management [phone]

## 2018-11-24 ENCOUNTER — Telehealth: Payer: Self-pay | Admitting: Family Medicine

## 2018-11-25 ENCOUNTER — Ambulatory Visit: Payer: Self-pay | Admitting: Pharmacist

## 2018-11-25 DIAGNOSIS — Z794 Long term (current) use of insulin: Secondary | ICD-10-CM

## 2018-11-25 DIAGNOSIS — E1122 Type 2 diabetes mellitus with diabetic chronic kidney disease: Secondary | ICD-10-CM

## 2018-11-25 NOTE — Chronic Care Management (AMB) (Signed)
Chronic Care Management   Follow Up Note   11/25/2018 Name: Danielle Park MRN: [id number] DOB: 1950/09/22  Referred by: Leone Haven, MD Reason for referral : Chronic Care Management (Medication Management)   Danielle Park is a 68 y.o. year old female who is a primary care patient of Caryl Bis, Angela Adam, MD. The CCM team was consulted for assistance with chronic disease management and care coordination needs.    Care coordination completed today.   Review of patient status, including review of consultants reports, relevant laboratory and other test results, and collaboration with appropriate care team members and the patient's provider was performed as part of comprehensive patient evaluation and provision of chronic care management services.    SDOH (Social Determinants of Health) screening performed today: Financial Strain . See Care Plan for related entries.   Outpatient Encounter Medications as of 11/25/2018  Medication Sig Note  . amLODipine (NORVASC) 10 MG tablet TAKE 1 TABLET BY MOUTH EVERY DAY   . aspirin 81 MG tablet Take 81 mg by mouth daily.   . clopidogrel (PLAVIX) 75 MG tablet Take 1 tablet (75 mg total) by mouth daily.   . Dulaglutide (TRULICITY) 1.5 IO/9.6EX SOPN Inject 1.5 mg into the skin once a week.   . furosemide (LASIX) 20 MG tablet TAKE 1 TABLET BY MOUTH EVERY DAY   . glipiZIDE (GLUCOTROL XL) 10 MG 24 hr tablet TAKE 1 TABLET BY MOUTH EVERY DAY   . hydrALAZINE (APRESOLINE) 25 MG tablet TAKE 1 TABLET BY MOUTH THREE TIMES A DAY 10/28/2018: Taking twice daily   . Insulin Pen Needle (NOVOFINE) 32G X 6 MM MISC USE AS DIRECTED WITH LEVEMIR PEN. Please reschedule missed appt with PCP soon.   Marland Kitchen LANTUS SOLOSTAR 100 UNIT/ML Solostar Pen INJECT 40 UNITS INTO THE SKIN DAILY AT 10 PM.   . levothyroxine (SYNTHROID) 75 MCG tablet TAKE ONE TABLET BY MOUTH ONCE DAILY BEFORE BREAKFAST   . lisinopril (ZESTRIL) 40 MG tablet TAKE 1 TABLET BY MOUTH EVERY DAY   . metoprolol  tartrate (LOPRESSOR) 25 MG tablet TAKE 1 TABLET BY MOUTH TWICE A DAY   . rosuvastatin (CRESTOR) 10 MG tablet TAKE 1 TABLET (10 MG TOTAL) BY MOUTH DAILY.   Marland Kitchen Vitamin D, Ergocalciferol, (DRISDOL) 1.25 MG (50000 UT) CAPS capsule Take 1 capsule (50,000 Units total) by mouth every 7 (seven) days.    No facility-administered encounter medications on file as of 11/25/2018.      Goals Addressed            This Visit's Progress     Patient Stated   . "I want to get my diabetes better controlled" (pt-stated)       Current Barriers:  . Diabetes: uncontrolled; most recent A1c 10.3% o Applied for [employer] for [employer] (switch from Lantus to reduce paperwork need) . 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 Current blood glucose readings: Fasting: 80-110 since starting Trulicity . Cardiovascular risk reduction: o Current hypertensive regimen: lisinopril 40 mg daily, amlodipine 10 mg daily, furosemide 20 mg QAM; BP at goal <140/90 at last office visit o Current hyperlipidemia regimen: rosuvastatin 10 mg daily, LDL well controlled <70 on last check  Pharmacist Clinical Goal(s):  Marland Kitchen Over the next 90 days, patient with work with PharmD and primary care provider to address optimized medication  management . Over the next 30 days, patient will work with PharmD on medication access concerns  Interventions: . [facility] from [facility] - patient has been APPROVED for [employer] assistance for Trulicity/Basaglar through 03/27/2019 . Contacted patient; informed her that we would be following up with [employer] later this week to ascertain shipping information. Informed her that [facility] may call her to set up shipping. She verbalized understanding.  . Will update medication list to reflect Lantus to Basaglar switch  when patient receives the medication  Patient Self Care Activities:  . Patient will check blood glucose daily, document, and provide at future appointments. Will discuss BID checks moving forward . Patient will take medications as prescribed . Patient will contact provider with any episodes of hypoglycemia . Patient will report any questions or concerns to provider   Please see past updates related to this goal by clicking on the "Past Updates" button in the selected goal          Plan:  - Will continue to collaborate with patient and Pattricia BossJill Simcox, CPhT for medication acquisition.   Catie Feliz Beamravis, PharmD, CPP Clinical Pharmacist [facility] [place] [facility] [phone]

## 2018-11-25 NOTE — Patient Instructions (Signed)
Visit Information  Goals Addressed            This Visit's Progress     Patient Stated   . "I want to get my diabetes better controlled" (pt-stated)       Current Barriers:  . Diabetes: uncontrolled; most recent A1c 10.3% o Applied for [employer] for [employer] (switch from Lantus to reduce paperwork need) . 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 Current blood glucose readings: Fasting: 80-110 since starting Trulicity . Cardiovascular risk reduction: o Current hypertensive regimen: lisinopril 40 mg daily, amlodipine 10 mg daily, furosemide 20 mg QAM; BP at goal <140/90 at last office visit o Current hyperlipidemia regimen: rosuvastatin 10 mg daily, LDL well controlled <70 on last check  Pharmacist Clinical Goal(s):  Marland Kitchen Over the next 90 days, patient with work with PharmD and primary care provider to address optimized medication management . Over the next 30 days, patient will work with PharmD on medication access concerns  Interventions: . Received fax from [facility] - patient has been APPROVED for [employer] assistance for Trulicity/Basaglar through 03/27/2019 . Contacted patient; informed her that we would be following up with [employer] later this week to ascertain shipping information. Informed her that [facility] may call her to set up shipping. She verbalized understanding.  . Will update medication list to reflect Lantus to [place] switch when patient receives the medication  Patient Self Care Activities:  . Patient will check blood glucose daily, document, and provide at future appointments. Will discuss BID checks moving forward . Patient will take medications as prescribed . Patient will contact provider with any episodes of hypoglycemia . Patient will report any questions or  concerns to provider   Please see past updates related to this goal by clicking on the "Past Updates" button in the selected goal         The patient verbalized understanding of instructions provided today and declined a print copy of patient instruction materials.    Plan:  - Will continue to collaborate with patient and Susy Frizzle, CPhT for medication acquisition.   [name], PharmD, [place] Pharmacist [facility] [place] [phone]

## 2018-11-26 NOTE — Telephone Encounter (Signed)
Patient has a lab scheduled for 12/09/2018.  Daneli Butkiewicz,cma

## 2018-11-26 NOTE — Telephone Encounter (Signed)
She needs to have her vitamin D rechecked prior to having this refilled again. Please contact her to get labs scheduled. Order already in place. Thanks.

## 2018-11-28 ENCOUNTER — Other Ambulatory Visit: Payer: Self-pay | Admitting: Pharmacy Technician

## 2018-11-28 NOTE — Patient Outreach (Signed)
[facility]) Care Management  11/28/2018  JENELLE DRENNON 1950-07-27 [id number]    Care coordination call placed to [place] in regards to patient's application for [place] and Trulicity.  Spoke to [facility] who informed patient had been APPROVED 11/21/2018-03/27/2019. She informed an order was placed with [facility] on 11/22/2018.  Care coordination call placed to [place] in regards to [place] manager for [employer].  Spoke to [place] who informed the order was previously ready for shipment. She was unable to provide me with an exact date of delivery but informed the medication could be delivered any time between now and 12/04/2018. She also informed a signature would be required.  Will outreach patient with this information.  Taisia Fantini P. Daelynn Blower, [place] Management [phone]

## 2018-11-28 NOTE — Patient Outreach (Signed)
[facility]) Care Management  11/28/2018  ROSS HEFFERAN 05-10-1950 [id number]    Successful outreach call placed to patient in regards to [employer] application for [employer].  Spoke to patient, HIPAA identifiers verified.  Informed patient that her medications would be delivered between now and 12/04/2018. Informed patient they would not provide me with an exact date. Patient informed she would try and have someone home who could sign for the medication and she appreciated our help.  Will followup with patient in 5-7 business days to confirm receipt of medication.  Evanne Matsunaga P. [place], [place] Management [phone]

## 2018-12-04 ENCOUNTER — Other Ambulatory Visit: Payer: Self-pay | Admitting: Pharmacy Technician

## 2018-12-04 NOTE — Patient Outreach (Signed)
[facility]) Care Management  12/04/2018  HANNELORE BOVA [birth year] [id number]  Care coordination call placed to [place] in regards to patient's [name] application for [employer] and Trulicity.  Spoke to [place] who informed the medication was not going to be delivered today due to warehouse issues. After much discussion was able to get Roger Kill to her manager to expedite the shipment. Roger Kill informed the shipment would be delivered on Saturday 12/07/2018.  Will outreach patient with this information.  Cervando Durnin P. Rana Hochstein, [place] Management [phone]

## 2018-12-04 NOTE — Patient Outreach (Signed)
[facility]) Care Management  12/04/2018  Danielle Park [birth year] [id number]  ADDENDUM  Successful outreach call placed to patient in regards to [employer] application for Trulicity and [employer].  Spoke to patient, HIPAA identifiers verified.  Informed patient that due to a warehouse issue at [employer], the medication has not shipped and would not be delivered today. Informed patient I called [facility] and was able to get them to agree to deliver the medication on Saturday 12/07/2018. Patient verbalized understanding.  Patient informed she was due for a Trulicity injection on Monday but did not take it due to not having the medication.   I reached out to embedded [place] via secure chat in Epic to inquire if the patient would be able to receive a sample and if it was ok if she dosed the medication today and again on Monday 12/09/2018. Per [place], patient would be able to pick up a sample today, administer it today and then resume normal Monday injections.  Relayed this information to the patient and she verbalized understanding.  Will followup with patient in 3-7 business days to inquire if medication was received.  Lindaann Gradilla P. Menno Vanbergen, [place] Management [phone]

## 2018-12-06 ENCOUNTER — Other Ambulatory Visit: Payer: Self-pay | Admitting: Family Medicine

## 2018-12-09 ENCOUNTER — Other Ambulatory Visit (INDEPENDENT_AMBULATORY_CARE_PROVIDER_SITE_OTHER): Payer: PPO

## 2018-12-09 ENCOUNTER — Other Ambulatory Visit: Payer: Self-pay

## 2018-12-09 ENCOUNTER — Other Ambulatory Visit: Payer: Self-pay | Admitting: Pharmacy Technician

## 2018-12-09 DIAGNOSIS — E559 Vitamin D deficiency, unspecified: Secondary | ICD-10-CM

## 2018-12-09 LAB — VITAMIN D 25 HYDROXY (VIT D DEFICIENCY, FRACTURES): VITD: 37.4 ng/mL (ref 30.00–100.00)

## 2018-12-09 NOTE — Patient Outreach (Signed)
[facility]) Care Management  12/09/2018  Danielle Park 30-Nov-1950 [id number]   Successful outreach call placed to patient in regards to [employer] application for [employer].  Spoke to patient, HIPAA identifiers verified.  Patient informed she received 3 boxes of Basaglar and 4 boxes of Trulicity. Discussed refill procedure with patient and she verbalized understanding. Patient informed she had no other questions or concerns and confirmed having my contact information.  Will route note to embedded [facility] RPh [name] that patient assistance has been completed and will remove myself from care team.  Luiz Ochoa. Brylen Wagar, [place] Management [phone]

## 2018-12-12 ENCOUNTER — Other Ambulatory Visit: Payer: Self-pay

## 2018-12-12 ENCOUNTER — Encounter: Payer: Self-pay | Admitting: *Deleted

## 2018-12-15 ENCOUNTER — Other Ambulatory Visit: Payer: Self-pay | Admitting: Family Medicine

## 2018-12-16 ENCOUNTER — Encounter: Payer: Self-pay | Admitting: Family Medicine

## 2018-12-16 ENCOUNTER — Other Ambulatory Visit: Payer: Self-pay

## 2018-12-16 ENCOUNTER — Ambulatory Visit (INDEPENDENT_AMBULATORY_CARE_PROVIDER_SITE_OTHER): Payer: PPO | Admitting: Family Medicine

## 2018-12-16 DIAGNOSIS — N183 Chronic kidney disease, stage 3 unspecified: Secondary | ICD-10-CM

## 2018-12-16 DIAGNOSIS — E785 Hyperlipidemia, unspecified: Secondary | ICD-10-CM

## 2018-12-16 DIAGNOSIS — E559 Vitamin D deficiency, unspecified: Secondary | ICD-10-CM

## 2018-12-16 DIAGNOSIS — E039 Hypothyroidism, unspecified: Secondary | ICD-10-CM

## 2018-12-16 DIAGNOSIS — Z794 Long term (current) use of insulin: Secondary | ICD-10-CM

## 2018-12-16 DIAGNOSIS — R12 Heartburn: Secondary | ICD-10-CM | POA: Insufficient documentation

## 2018-12-16 DIAGNOSIS — I150 Renovascular hypertension: Secondary | ICD-10-CM | POA: Diagnosis not present

## 2018-12-16 DIAGNOSIS — E1322 Other specified diabetes mellitus with diabetic chronic kidney disease: Secondary | ICD-10-CM | POA: Diagnosis not present

## 2018-12-16 NOTE — Assessment & Plan Note (Signed)
Much improved control.  She will continue on the Trulicity, glipizide, and Lantus.  If she starts to notice more CBGs running less than 70 she will contact us and we will discontinue glipizide.  We will check with our clinical pharmacist regarding the reflux and the Trulicity.

## 2018-12-16 NOTE — Assessment & Plan Note (Signed)
Vitamin D was normal recently.  She will continue 2000 international units daily.

## 2018-12-16 NOTE — Assessment & Plan Note (Signed)
Likely related to the Trulicity.  Discussed taking her nighttime medications at an appropriate time earlier in the evening.  We will check with our clinical pharmacist regarding this.

## 2018-12-16 NOTE — Patient Instructions (Signed)
Nice to see you. Please continue to monitor your glucose.  If you start to run in the 60s more frequently please let us know right away. We will have you return in about a month for a nurse visit for an A1c to be checked. I will see you back in about 4 months. Please continue with your current vitamin D supplement over-the-counter.

## 2018-12-16 NOTE — Assessment & Plan Note (Signed)
Continue Crestor 

## 2018-12-16 NOTE — Assessment & Plan Note (Signed)
>>  ASSESSMENT AND PLAN FOR CONTROLLED DIABETES MELLITUS TYPE 2 WITH COMPLICATIONS (HCC) WRITTEN ON 12/16/2018  1:38 PM BY SONNENBERG, ERIC G, MD  Much improved control.  She will continue on the Trulicity , glipizide , and Lantus .  If she starts to notice more CBGs running less than 70 she will contact us  and we will discontinue glipizide .  We will check with our clinical pharmacist regarding the reflux and the Trulicity .

## 2018-12-16 NOTE — Assessment & Plan Note (Signed)
Well-controlled at home.  She will continue her current regimen. 

## 2018-12-16 NOTE — Assessment & Plan Note (Signed)
Continue Synthroid °

## 2018-12-16 NOTE — Telephone Encounter (Signed)
Discussed in the office

## 2018-12-16 NOTE — Progress Notes (Signed)
Marikay Alar, MD Phone: [phone]  Danielle Park is a 68 y.o. female who presents today for follow-up.  Hypertension: Typically running 104-127/60-70.  Taking amlodipine, Lasix, hydralazine, metoprolol, and lisinopril.  No chest pain or shortness of breath.  Notes her bilateral ankle edema has improved.  Diabetes: Typically running 75-1 41.  On one occasion has been less than 70.  She ate something.  She did not have any symptoms.  She is on Trulicity, glipizide, and Lantus 30 units daily.  No polyuria or polydipsia.  She is due to see an ophthalmologist.  She does note some decreased appetite on the Trulicity.  Heartburn: Patient notes since starting on the Trulicity she has had intermittent heartburn about twice a week.  Typically occurs if she does not take her nighttime medicine at an appropriate time.  If she waits to take it around bedtime she will have reflux symptoms with heartburn in her throat.  No abdominal pain.  No blood in her stool.  Hypothyroidism: Taking Synthroid.  No skin changes, heat intolerance, or cold intolerance.  Hyperlipidemia: Taking Crestor.  She reports she broke a tooth off and is going to have a root canal.  Vitamin D deficiency: She reports she is taking 2000 international units daily.  Social History   Tobacco Use  Smoking Status Never Smoker  Smokeless Tobacco Never Used     ROS see history of present illness  Objective  Physical Exam Vitals:   12/16/18 1313 12/16/18 1347  BP: (!) 150/70 120/78  Pulse: 71   Temp: (!) 96.7 F (35.9 C)   SpO2: 97%     BP Readings from Last 3 Encounters:  12/16/18 120/78  10/18/18 130/74  04/19/18 136/62   Wt Readings from Last 3 Encounters:  12/16/18 204 lb 6.4 oz (92.7 kg)  10/18/18 206 lb (93.4 kg)  04/19/18 201 lb 6.4 oz (91.4 kg)    Physical Exam Constitutional:      General: She is not in acute distress.    Appearance: She is not diaphoretic.  Cardiovascular:     Rate and Rhythm:  Normal rate and regular rhythm.     Heart sounds: Normal heart sounds.  Pulmonary:     Effort: Pulmonary effort is normal.     Breath sounds: Normal breath sounds.  Abdominal:     General: Bowel sounds are normal. There is no distension.     Palpations: Abdomen is soft.     Tenderness: There is no abdominal tenderness. There is no guarding or rebound.  Skin:    General: Skin is warm and dry.  Neurological:     Mental Status: She is alert.      Assessment/Plan: Please see individual problem list.  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  She will continue her current regimen.  Diabetes (HCC) Much improved control.  She will continue on the Trulicity, glipizide, and Lantus.  If she starts to notice more CBGs running less than 70 she will contact us and we will discontinue glipizide.  We will check with our clinical pharmacist regarding the reflux and the Trulicity.  Hypothyroidism Continue Synthroid.  Hyperlipidemia Continue Crestor.  Heartburn Likely related to the Trulicity.  Discussed taking her nighttime medications at an appropriate time earlier in the evening.  We will check with our clinical pharmacist regarding this.  Vitamin D deficiency Vitamin D was normal recently.  She will continue 2000 international units daily.  Broken tooth: She will see her dentist as planned.   No orders  of the defined types were placed in this encounter.   No orders of the defined types were placed in this encounter.    Tommi Rumps, MD [place]

## 2018-12-17 ENCOUNTER — Telehealth: Payer: Self-pay | Admitting: Family Medicine

## 2018-12-17 NOTE — Telephone Encounter (Signed)
Please let the patient know I heard back from the clinical pharmacist.  She noted that adjusting the time she is taking her evening medications to earlier to limit the reflux would be the best thing to do.  She will discuss this further with her when she meets with her next Monday.

## 2018-12-17 NOTE — Telephone Encounter (Signed)
-----   Message from De Hollingshead, [facility] sent at 12/16/2018  5:28 PM EDT ----- Danielle Park  Since it seems to be infrequently, and only when taking her medications late, I definitely agree w/ adjusting administration time rather than changing Trulicity, since she's done so well.   I don't anticipate that an alternative GLP1 would be any different (heartburn generally isn't a side effect), but we could always do that moving forward if needed.   I'll f/u about this next Monday when we talk.  Catie ----- Message ----- From: Leone Haven, MD Sent: 12/16/2018   1:43 PM EDT To: De Hollingshead, [facility]  Hey Catie,   This patient has been on trulicity for a couple of months. She noted onset of heart burn after starting this that only occurs 2x/week if she takes her night time meds late in the evening. I asked her to take her night time meds closer to 6 pm and monitor. She does not want to take any meds for the heartburn other than prn rolaids. Do you have any other suggestions other than changing the trulicity? Thanks.   Randall Hiss

## 2018-12-18 NOTE — Telephone Encounter (Signed)
I called and spoke with the patient and informed her that the pharmacist stated that she should take her evening medications a little earlier to prevent reflux and they will discuss this on Monday when they have their appointment.  Pt understood.  Leighton Luster,cma

## 2018-12-23 ENCOUNTER — Ambulatory Visit: Payer: PPO | Admitting: Pharmacist

## 2018-12-23 ENCOUNTER — Other Ambulatory Visit: Payer: Self-pay | Admitting: Family Medicine

## 2018-12-23 DIAGNOSIS — E1122 Type 2 diabetes mellitus with diabetic chronic kidney disease: Secondary | ICD-10-CM

## 2018-12-23 DIAGNOSIS — I150 Renovascular hypertension: Secondary | ICD-10-CM

## 2018-12-23 NOTE — Chronic Care Management (AMB) (Signed)
Chronic Care Management   Follow Up Note   12/23/2018 Name: Danielle Park MRN: [id number] DOB: April 17, 1950  Referred by: Leone Haven, MD Reason for referral : Chronic Care Management (Medication Management)   Danielle Park is a 68 y.o. year old female who is a primary care patient of Caryl Bis, Angela Adam, MD. The CCM team was consulted for assistance with chronic disease management and care coordination needs.    Contacted patient for medication management f/u.   Review of patient status, including review of consultants reports, relevant laboratory and other test results, and collaboration with appropriate care team members and the patient's provider was performed as part of comprehensive patient evaluation and provision of chronic care management services.    SDOH (Social Determinants of Health) screening performed today: Physical Activity. See Care Plan for related entries.   Advanced Directives Status: N See Care Plan and [employer] application for related entries.  Outpatient Encounter Medications as of 12/23/2018  Medication Sig Note  . amLODipine (NORVASC) 10 MG tablet TAKE 1 TABLET BY MOUTH EVERY DAY 12/23/2018: AM  . aspirin 81 MG tablet Take 81 mg by mouth daily.   . cholecalciferol (VITAMIN D3) 25 MCG (1000 UT) tablet Take 2,000 Units by mouth daily.   . clopidogrel (PLAVIX) 75 MG tablet Take 1 tablet (75 mg total) by mouth daily.   . Dulaglutide (TRULICITY) 1.5 PI/9.5JO SOPN Inject 1.5 mg into the skin once a week.   . furosemide (LASIX) 20 MG tablet TAKE 1 TABLET BY MOUTH EVERY DAY   . glipiZIDE (GLUCOTROL XL) 10 MG 24 hr tablet TAKE 1 TABLET BY MOUTH EVERY DAY   . hydrALAZINE (APRESOLINE) 25 MG tablet TAKE 1 TABLET BY MOUTH THREE TIMES A DAY 12/23/2018: Taking BID (QAM, after supper)  . Insulin Glargine (BASAGLAR KWIKPEN) 100 UNIT/ML SOPN Inject 30 Units into the skin daily.   . Insulin Pen Needle (NOVOFINE) 32G X 6 MM MISC USE AS DIRECTED WITH LEVEMIR PEN. Please  reschedule missed appt with PCP soon.   Marland Kitchen levothyroxine (SYNTHROID) 75 MCG tablet TAKE ONE TABLET BY MOUTH ONCE DAILY BEFORE BREAKFAST   . lisinopril (ZESTRIL) 40 MG tablet TAKE 1 TABLET BY MOUTH EVERY DAY 12/23/2018: QAM  . metoprolol tartrate (LOPRESSOR) 25 MG tablet TAKE 1 TABLET BY MOUTH TWICE A DAY   . rosuvastatin (CRESTOR) 10 MG tablet TAKE 1 TABLET BY MOUTH EVERY DAY   . [DISCONTINUED] LANTUS SOLOSTAR 100 UNIT/ML Solostar Pen INJECT 40 UNITS INTO THE SKIN DAILY AT 10 PM.   . [DISCONTINUED] Vitamin D, Ergocalciferol, (DRISDOL) 1.25 MG (50000 UT) CAPS capsule TAKE 1 CAPSULE (50,000 UNITS TOTAL) BY MOUTH EVERY 7 (SEVEN) DAYS. (Patient not taking: Reported on 12/23/2018)    No facility-administered encounter medications on file as of 12/23/2018.      Goals Addressed            This Visit's Progress     Patient Stated   . "I want to get my diabetes better controlled" (pt-stated)       Current Barriers:  . Diabetes: uncontrolled though improved per SMBG results; most recent A1c 10.3% o Noted to Dr. Caryl Bis that she was having acid reflux if she took her night medications before bed ever since starting Trulicity. Notes that she has been more regimented in taking night medications appropriately, and her heart burn is better controlled  . 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 Current blood glucose readings: Fasting: ~100s since starting Trulicity . No episodes of hypoglycemia, or s/sx of hypoglycemia . Typical meals:  o Breakfast: coffee + piece of toast w/ butter o Lunch: Chicken; vegetables o Snacks: Has cut back on high carb snacks: apples, bananas;  o Drinks: 1 cup of coffee + water  . Cardiovascular risk reduction: o Current  hypertensive regimen: lisinopril 40 mg QAM, amlodipine 10 mg QAM, furosemide 20 mg QAM; hydralazine 25 mg BID, metoprolol tartrate 25 mg BID BP at home 120s/60s o Current hyperlipidemia regimen: rosuvastatin 10 mg daily, LDL well controlled <70 on last check o Antiplatelet therapy (PVD, renal artery stenting in 2014): clopidogrel 75 mg daily, ASA 81 mg daily  - both agents recommended by vascular  Pharmacist Clinical Goal(s):  Marland Kitchen Over the next 90 days, patient with work with PharmD and primary care provider to address optimized medication management  Interventions: . Congratulated patient on improvement in BG. Anticipate significant improvement in A1c next month; lab visit is scheduled . Patient notes she has a root canal scheduled soon. I encouraged to hold glipizide on days she is not eating regularly to prevent hypoglycemia. She verbalized understanding.  . Discussed non-pharmacologic measures to reduce acid reflux.  . Moving forward, will more further discuss physical activity and weight loss.  Patient Self Care Activities:  . Patient will check blood glucose daily, document, and provide at future appointments. Will discuss BID checks moving forward . Patient will take medications as prescribed . Patient will contact provider with any episodes of hypoglycemia . Patient will report any questions or concerns to provider   Please see past updates related to this goal by clicking on the "Past Updates" button in the selected goal         Plan:  - Will outreach patient in ~4 weeks as scheduled for continued medication management support  [name], PharmD, CPP Clinical Pharmacist [facility] [employer] [phone]

## 2018-12-23 NOTE — Patient Instructions (Signed)
Visit Information  Goals Addressed            This Visit's Progress     Patient Stated   . "I want to get my diabetes better controlled" (pt-stated)       Current Barriers:  . Diabetes: uncontrolled though improved per SMBG results; most recent A1c 10.3% o Noted to Dr. Caryl Bis that she was having acid reflux if she took her night medications before bed ever since starting Trulicity. Notes that she has been more regimented in taking night medications appropriately, and her heart burn is better controlled  . 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 Current blood glucose readings: Fasting: ~100s since starting Trulicity . No episodes of hypoglycemia, or s/sx of hypoglycemia . Typical meals:  o Breakfast: coffee + piece of toast w/ butter o Lunch: Chicken; vegetables o Snacks: Has cut back on high carb snacks: apples, bananas;  o Drinks: 1 cup of coffee + water  . Cardiovascular risk reduction: o Current hypertensive regimen: lisinopril 40 mg QAM, amlodipine 10 mg QAM, furosemide 20 mg QAM; hydralazine 25 mg BID, metoprolol tartrate 25 mg BID BP at home 120s/60s o Current hyperlipidemia regimen: rosuvastatin 10 mg daily, LDL well controlled <70 on last check o Antiplatelet therapy (PVD, renal artery stenting in 2014): clopidogrel 75 mg daily, ASA 81 mg daily  - both agents recommended by vascular  Pharmacist Clinical Goal(s):  Marland Kitchen Over the next 90 days, patient with work with PharmD and primary care provider to address optimized medication management  Interventions: . Congratulated patient on improvement in BG. Anticipate significant improvement in A1c next month; lab visit is scheduled . Patient notes she has a root canal scheduled soon. I  encouraged to hold glipizide on days she is not eating regularly to prevent hypoglycemia. She verbalized understanding.  . Discussed non-pharmacologic measures to reduce acid reflux.  . Moving forward, will more further discuss physical activity and weight loss.  Patient Self Care Activities:  . Patient will check blood glucose daily, document, and provide at future appointments. Will discuss BID checks moving forward . Patient will take medications as prescribed . Patient will contact provider with any episodes of hypoglycemia . Patient will report any questions or concerns to provider   Please see past updates related to this goal by clicking on the "Past Updates" button in the selected goal         The patient verbalized understanding of instructions provided today and declined a print copy of patient instruction materials.   Plan:  - Will outreach patient in ~4 weeks as scheduled for continued medication management support  [name], PharmD, [place] Pharmacist [place] [phone]

## 2019-01-12 ENCOUNTER — Other Ambulatory Visit: Payer: Self-pay | Admitting: Family Medicine

## 2019-01-14 ENCOUNTER — Ambulatory Visit (INDEPENDENT_AMBULATORY_CARE_PROVIDER_SITE_OTHER): Payer: PPO | Admitting: *Deleted

## 2019-01-14 ENCOUNTER — Other Ambulatory Visit: Payer: Self-pay

## 2019-01-14 DIAGNOSIS — Z794 Long term (current) use of insulin: Secondary | ICD-10-CM | POA: Diagnosis not present

## 2019-01-14 DIAGNOSIS — E1122 Type 2 diabetes mellitus with diabetic chronic kidney disease: Secondary | ICD-10-CM | POA: Diagnosis not present

## 2019-01-14 LAB — POCT GLYCOSYLATED HEMOGLOBIN (HGB A1C)
HbA1c POC (<> result, manual entry): 6.4 % (ref 4.0–5.6)
HbA1c, POC (controlled diabetic range): 6.4 % (ref 0.0–7.0)
HbA1c, POC (prediabetic range): 6.4 % (ref 5.7–6.4)
Hemoglobin A1C: 6.4 % — AB (ref 4.0–5.6)

## 2019-01-14 NOTE — Progress Notes (Signed)
Patient tolerated A1c well .

## 2019-01-16 ENCOUNTER — Other Ambulatory Visit: Payer: Self-pay

## 2019-01-16 ENCOUNTER — Other Ambulatory Visit: Payer: Self-pay | Admitting: Family Medicine

## 2019-01-20 ENCOUNTER — Ambulatory Visit (INDEPENDENT_AMBULATORY_CARE_PROVIDER_SITE_OTHER): Payer: PPO | Admitting: Pharmacist

## 2019-01-20 DIAGNOSIS — E1122 Type 2 diabetes mellitus with diabetic chronic kidney disease: Secondary | ICD-10-CM | POA: Diagnosis not present

## 2019-01-20 DIAGNOSIS — Z794 Long term (current) use of insulin: Secondary | ICD-10-CM | POA: Diagnosis not present

## 2019-01-20 NOTE — Chronic Care Management (AMB) (Addendum)
Chronic Care Management   Follow Up Note   01/20/2019 Name: Danielle Park MRN: [id number] DOB: 02-22-1951  Referred by: Leone Haven, MD Reason for referral : Chronic Care Management (Medication Management)   WILLONA Park is a 68 y.o. year old female who is a primary care patient of Caryl Bis, Angela Adam, MD. The CCM team was consulted for assistance with chronic disease management and care coordination needs.    Review of patient status, including review of consultants reports, relevant laboratory and other test results, and collaboration with appropriate care team members and the patient's provider was performed as part of comprehensive patient evaluation and provision of chronic care management services.    SDOH (Social Determinants of Health) screening performed today: Physical Activity. See Care Plan for related entries.   Advanced Directives Status: N See Care Plan and [employer] application for related entries.  Outpatient Encounter Medications as of 01/20/2019  Medication Sig Note   Dulaglutide (TRULICITY) 1.5 EL/3.8BO SOPN Inject 1.5 mg into the skin once a week.    glipiZIDE (GLUCOTROL XL) 10 MG 24 hr tablet TAKE 1 TABLET BY MOUTH EVERY DAY    Insulin Glargine (BASAGLAR KWIKPEN) 100 UNIT/ML SOPN Inject 30 Units into the skin daily.    amLODipine (NORVASC) 10 MG tablet TAKE 1 TABLET BY MOUTH EVERY DAY 12/23/2018: AM   aspirin 81 MG tablet Take 81 mg by mouth daily.    cholecalciferol (VITAMIN D3) 25 MCG (1000 UT) tablet Take 2,000 Units by mouth daily.    clopidogrel (PLAVIX) 75 MG tablet TAKE 1 TABLET (75 MG TOTAL) BY MOUTH DAILY.    D-5000 125 MCG (5000 UT) TABS Take 1 tablet by mouth once a week.    furosemide (LASIX) 20 MG tablet TAKE 1 TABLET BY MOUTH EVERY DAY    hydrALAZINE (APRESOLINE) 25 MG tablet TAKE 1 TABLET BY MOUTH THREE TIMES A DAY 12/23/2018: Taking BID (QAM, after supper)   Insulin Pen Needle (NOVOFINE) 32G X 6 MM MISC USE AS DIRECTED WITH LEVEMIR  PEN. Please reschedule missed appt with PCP soon.    levothyroxine (SYNTHROID) 75 MCG tablet TAKE ONE TABLET BY MOUTH ONCE DAILY BEFORE BREAKFAST    lisinopril (ZESTRIL) 40 MG tablet TAKE 1 TABLET BY MOUTH EVERY DAY 12/23/2018: QAM   metoprolol tartrate (LOPRESSOR) 25 MG tablet TAKE 1 TABLET BY MOUTH TWICE A DAY    rosuvastatin (CRESTOR) 10 MG tablet TAKE 1 TABLET BY MOUTH EVERY DAY    No facility-administered encounter medications on file as of 01/20/2019.      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 o Approved for Basaglar/Trulicity assistance through [employer] through 03/27/2019  Current blood glucose readings: Fastings remain in 100-120s, not checking post prandial readings  Denies s/sx hypoglycemia  Typical meals:  o Breakfast: coffee + piece of toast w/ butter o Lunch: Smaller meal, leftovers o Supper: Largest meal of the day, generally before 4 pm  o Snacks: Continues to avoid hight carb snacks, chooses fruits o Drinks: 1 cup of coffee + water   Physical Activity:  notes she has a treadmill at home, walks for 10 minutes a couple of times a day; occasionally will go into town   Cardiovascular risk reduction: o Current hypertensive regimen: lisinopril 40 mg QAM, amlodipine 10 mg QAM, furosemide 20 mg QAM; hydralazine 25 mg BID, metoprolol tartrate 25 mg BID; BP at home at goal 120s/60s o Current hyperlipidemia regimen: rosuvastatin 10 mg daily, LDL well controlled <70 on last check o Antiplatelet therapy (PVD, renal artery stenting in 2014): clopidogrel 75 mg daily, ASA 81 mg daily  - both agents recommended by  vascular  Pharmacist Clinical Goal(s):   Over the next 90 days, patient with work with PharmD and primary care provider to address optimized medication management  Interventions:  Congratulated patient on obtaining goal A1c <7%. Patient very pleased with effects of Trulicity  Discussed that glipizide increases risk of hypoglycemia and weight gain. Discussed impact on post-prandial readings; recommended patient hold glipizide for the next few weeks and monitor post-prandial readings to determine if glipizide still needed  Discussed goal fasting glucose and goal post prandial readings. Encouraged patient to contact me if she has problems with significant post-prandial elevations with holding glipizide. If therapy needed, would prefer glipizide IR w/ supper, rather than extended release glipizide.   Discussed increasing physical activity as tolerated (limited by back pain). Discussed benefit of physical activity on glucose results w/ holding glipizide. Patient notes that she will try to increase frequency or duration of treadmill walks.  Over the next 2 months, will discuss plans for reapplying for [employer] for Trulicity/Basaglar for 2021.   Per outside refill history, patient up to date on fills for lisinopril and rosuvastatin  Patient Self Care Activities:   Patient will check blood glucose BID, document, and provide at future appointments.   Patient will take medications as prescribed  Patient will contact provider with any episodes of hypoglycemia  Patient will report any questions or concerns to provider   Please see past updates related to this goal by clicking on the "Past Updates" button in the selected goal         Plan:  - Will outreach patient in the next 4-5 weeks for continued medication management support  [name], PharmD, CPP Clinical Pharmacist [facility] [employer] [phone]

## 2019-01-20 NOTE — Patient Instructions (Addendum)
Visit Information  Goals Addressed            This Visit's Progress     Patient Stated   . "I want to get my diabetes better controlled" (pt-stated)       Current Barriers:  . Diabetes, controlled; most recent A1c 6.4% . 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 Current blood glucose readings: Fastings remain in 100-120s, not checking post prandial readings . Denies s/sx hypoglycemia . Typical meals:  o Breakfast: coffee + piece of toast w/ butter o Lunch: Smaller meal, leftovers o Supper: Largest meal of the day, generally before 4 pm  o Snacks: Continues to avoid hight carb snacks, chooses fruits o Drinks: 1 cup of coffee + water  . Physical Activity: notes she has a treadmill at home, walks for 10 minutes a couple of times a day; occasionally will go into town  . Cardiovascular risk reduction: o Current hypertensive regimen: lisinopril 40 mg QAM, amlodipine 10 mg QAM, furosemide 20 mg QAM; hydralazine 25 mg BID, metoprolol tartrate 25 mg BID; BP at home at goal 120s/60s o Current hyperlipidemia regimen: rosuvastatin 10 mg daily, LDL well controlled <70 on last check o Antiplatelet therapy (PVD, renal artery stenting in 2014): clopidogrel 75 mg daily, ASA 81 mg daily  - both agents recommended by vascular  Pharmacist Clinical Goal(s):  Marland Kitchen Over the next 90 days, patient with work with PharmD and primary care provider to address optimized medication management  Interventions: . Congratulated patient on obtaining goal A1c <7%. Patient very pleased with effects of Trulicity . Discussed that glipizide increases risk of hypoglycemia and weight gain. Discussed impact on post-prandial readings; recommended patient hold glipizide for the next few  weeks and monitor post-prandial readings to determine if glipizide still needed . Discussed goal fasting glucose and goal post prandial readings. Encouraged patient to contact me if she has problems with significant post-prandial elevations with holding glipizide. If therapy needed, would prefer glipizide IR w/ supper, rather than extended release glipizide.  . Discussed increasing physical activity as tolerated (limited by back pain). Discussed benefit of physical activity on glucose results w/ holding glipizide. Patient notes that she will try to increase frequency or duration of treadmill walks. . Over the next 2 months, will discuss plans for reapplying for [facility] for Trulicity/Basaglar for 2130.  Marland Kitchen Per outside refill history, patient up to date on fills for lisinopril and rosuvastatin  Patient Self Care Activities:  . Patient will check blood glucose BID, document, and provide at future appointments.  . Patient will take medications as prescribed . Patient will contact provider with any episodes of hypoglycemia . Patient will report any questions or concerns to provider   Please see past updates related to this goal by clicking on the "Past Updates" button in the selected goal         The patient verbalized understanding of instructions provided today and declined a print copy of patient instruction materials.   Plan:  - Will outreach patient in the next 4-5 weeks for continued medication management support  [name], PharmD, [place] Pharmacist [place] [phone]

## 2019-02-22 ENCOUNTER — Other Ambulatory Visit: Payer: Self-pay | Admitting: Family Medicine

## 2019-03-13 ENCOUNTER — Other Ambulatory Visit: Payer: Self-pay | Admitting: Family Medicine

## 2019-03-18 ENCOUNTER — Telehealth: Payer: Self-pay

## 2019-03-19 ENCOUNTER — Ambulatory Visit: Payer: Self-pay | Admitting: Pharmacist

## 2019-03-19 NOTE — Chronic Care Management (AMB) (Signed)
  Chronic Care Management   Note  03/19/2019 Name: Danielle Park MRN: [id number] DOB: Feb 15, 1951  Danielle Park is a 68 y.o. year old female who is a primary care patient of Caryl Bis, Angela Adam, MD. The CCM team was consulted for assistance with chronic disease management and care coordination needs.    Contacted patient to discuss medication management and plans for [facility] enrollment. Patient is concerned about having elevated post prandial readings after stopping glipizide.   Scheduled phone call appointment on 03/31/2019. Will determine dosing plans to reapply for [facility] assistance at that time.  [name], PharmD, [place], CPP Clinical Pharmacist [place] [phone]

## 2019-03-19 NOTE — Telephone Encounter (Signed)
Error.  Danielle Park,cma  

## 2019-03-31 ENCOUNTER — Ambulatory Visit (INDEPENDENT_AMBULATORY_CARE_PROVIDER_SITE_OTHER): Payer: PPO | Admitting: Pharmacist

## 2019-03-31 DIAGNOSIS — Z794 Long term (current) use of insulin: Secondary | ICD-10-CM | POA: Diagnosis not present

## 2019-03-31 DIAGNOSIS — E1122 Type 2 diabetes mellitus with diabetic chronic kidney disease: Secondary | ICD-10-CM

## 2019-03-31 MED ORDER — TRULICITY 3 MG/0.5ML ~~LOC~~ SOAJ: 3.0000 mg | SUBCUTANEOUS | 3 refills | Status: DC

## 2019-03-31 NOTE — Chronic Care Management (AMB) (Signed)
Chronic Care Management   Follow Up Note   03/31/2019 Name: Danielle Park MRN: [id number] DOB: Jan 12, 1951  Referred by: Leone Haven, MD Reason for referral : Chronic Care Management (Medication Management )   Danielle Park is a 69 y.o. year old female who is a primary care patient of Caryl Bis, Angela Adam, MD. The CCM team was consulted for assistance with chronic disease management and care coordination needs.    Contacted patient for medication management review.   Review of patient status, including review of consultants reports, relevant laboratory and other test results, and collaboration with appropriate care team members and the patient's provider was performed as part of comprehensive patient evaluation and provision of chronic care management services.    SDOH (Social Determinants of Health) screening performed today: Financial Strain . See Care Plan for related entries.   Outpatient Encounter Medications as of 03/31/2019  Medication Sig Note  . amLODipine (NORVASC) 10 MG tablet TAKE 1 TABLET BY MOUTH EVERY DAY 12/23/2018: AM  . aspirin 81 MG tablet Take 81 mg by mouth daily.   . clopidogrel (PLAVIX) 75 MG tablet TAKE 1 TABLET (75 MG TOTAL) BY MOUTH DAILY.   Marland Kitchen D-5000 125 MCG (5000 UT) TABS Take 1 tablet by mouth once a week.   . furosemide (LASIX) 20 MG tablet TAKE 1 TABLET BY MOUTH EVERY DAY   . hydrALAZINE (APRESOLINE) 25 MG tablet TAKE 1 TABLET BY MOUTH THREE TIMES A DAY 12/23/2018: Taking BID (QAM, after supper)  . Insulin Glargine (BASAGLAR KWIKPEN) 100 UNIT/ML SOPN Inject 30 Units into the skin daily. 03/31/2019: 30 units daily   . Insulin Pen Needle (NOVOFINE) 32G X 6 MM MISC USE AS DIRECTED WITH LEVEMIR PEN. Please reschedule missed appt with PCP soon.   Marland Kitchen levothyroxine (SYNTHROID) 75 MCG tablet TAKE ONE TABLET BY MOUTH ONCE DAILY BEFORE BREAKFAST   . lisinopril (ZESTRIL) 40 MG tablet TAKE 1 TABLET BY MOUTH EVERY DAY   . metoprolol tartrate (LOPRESSOR) 25 MG tablet  TAKE 1 TABLET BY MOUTH TWICE A DAY   . rosuvastatin (CRESTOR) 10 MG tablet TAKE 1 TABLET BY MOUTH EVERY DAY   . [DISCONTINUED] Dulaglutide (TRULICITY) 1.5 DT/2.6ZT SOPN Inject 1.5 mg into the skin once a week.   . cholecalciferol (VITAMIN D3) 25 MCG (1000 UT) tablet Take 2,000 Units by mouth daily.   . Dulaglutide (TRULICITY) 3 IW/5.8KD SOPN Inject 3 mg into the skin once a week.   . [DISCONTINUED] glipiZIDE (GLUCOTROL XL) 10 MG 24 hr tablet TAKE 1 TABLET BY MOUTH EVERY DAY (Patient not taking: Reported on 03/31/2019)    No facility-administered encounter medications on file as of 03/31/2019.     Goals Addressed            This Visit's Progress     Patient Stated   . "I want to get my diabetes better controlled" (pt-stated)       Current Barriers:  . Diabetes, controlled; most recent A1c 6.4%, though per SMBG likely less controlled now . 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 Current blood glucose readings:  Fasting 2 hr post supper   146 176  116 169  119 215  140 230  170 205  150 208  179 240  140 154  156 210  179 240  119 185  134 250  154 347  153 193  111 152  134 205  161 204  177    147 211   . Cardiovascular risk reduction: o Current hypertensive regimen: lisinopril 40 mg QAM, amlodipine 10 mg QAM, furosemide 20 mg QAM; hydralazine 25 mg BID, metoprolol tartrate 25 mg BID; o Current hyperlipidemia regimen: rosuvastatin 10 mg daily, LDL well controlled <70 on last check o Antiplatelet therapy (PVD, renal artery stenting in 2014): clopidogrel 75 mg daily, ASA 81 mg daily  - both agents recommended by vascular  Pharmacist Clinical Goal(s):  2015 Over the next 90 days,  patient with work with PharmD and primary care provider to address optimized medication management  Interventions: . Comprehensive medication review performed, medication list updated in electronic medical record.  . Reviewed goal A1c <7%, goal fasting glucose <130 and goal 2 hour post prandial glucose <180.  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  . Increase Trulicity to 3 mg weekly. Patient will use 2 of the pens she currently has per week, while we are securing supply of 3 mg pens . Due to reapply for patient assistance for 2021. Will collaborate w/ CPhT [employer] to mail patient her portion of application. Will collaborate w/ Dr. Noreene Larsson for his portion of application. Once all parts received, will collaborate w/ CPhT for submission and follow up.   Patient Self Care Activities:  . Patient will check blood glucose BID, document, and provide at future appointments.  . Patient will take medications as prescribed . Patient will contact provider with any episodes of hypoglycemia . Patient will report any questions or concerns to provider   Please see past updates related to this goal by clicking on the "Past Updates" button in the selected goal         Plan: - Will collaborate with patient, provider, and CPhT as above - Patient has PCP f/u on 04/21/19. Scheduled phone f/u with pharmacist 05/05/19 @ 9:30 am  Catie 07/03/19, PharmD, [place], CPP Clinical Pharmacist [facility] [phone]

## 2019-03-31 NOTE — Patient Instructions (Signed)
Visit Information  Goals Addressed            This Visit's Progress     Patient Stated   . "I want to get my diabetes better controlled" (pt-stated)       Current Barriers:  . Diabetes, controlled; most recent A1c 6.4%, though per SMBG likely less controlled now . 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 Current blood glucose readings:  Fasting 2 hr post supper   146 176  116 169  119 215  140 230  170 205  150 208  179 240  140 154  156 210  179 240  119 185  134 250  154  347  153 193  111 152  134 205  161 204  177   147 211   . Cardiovascular risk reduction: o Current hypertensive regimen: lisinopril 40 mg QAM, amlodipine 10 mg QAM, furosemide 20 mg QAM; hydralazine 25 mg BID, metoprolol tartrate 25 mg BID; o Current hyperlipidemia regimen: rosuvastatin 10 mg daily, LDL well controlled <70 on last check o Antiplatelet therapy (PVD, renal artery stenting in 2014): clopidogrel 75 mg daily, ASA 81 mg daily  - both agents recommended by vascular  Pharmacist Clinical Goal(s):  2015 Over the next 90 days, patient with work with PharmD and primary care provider to address optimized medication management  Interventions: . Comprehensive medication review performed, medication list updated in electronic medical record.  . Reviewed goal A1c <7%, goal fasting glucose <130 and goal 2 hour post prandial glucose <180.  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  . Increase Trulicity to 3 mg weekly. Patient will use 2 of the pens she currently has per week, while  we are securing supply of 3 mg pens . Due to reapply for patient assistance for 2021. Will collaborate w/ CPhT [employer] to mail patient her portion of application. Will collaborate w/ Dr. Noreene Larsson for his portion of application. Once all parts received, will collaborate w/ CPhT for submission and follow up.   Patient Self Care Activities:  . Patient will check blood glucose BID, document, and provide at future appointments.  . Patient will take medications as prescribed . Patient will contact provider with any episodes of hypoglycemia . Patient will report any questions or concerns to provider   Please see past updates related to this goal by clicking on the "Past Updates" button in the selected goal         The patient verbalized understanding of instructions provided today and declined a print copy of patient instruction materials.   Plan: - Will collaborate with patient, provider, and CPhT as above - Patient has PCP f/u on 04/21/19. Scheduled phone f/u with pharmacist 05/05/19 @ 9:30 am  Catie 07/03/19, PharmD, [place], CPP Clinical Pharmacist [facility] [phone]

## 2019-04-02 ENCOUNTER — Other Ambulatory Visit: Payer: Self-pay | Admitting: Pharmacy Technician

## 2019-04-02 NOTE — Patient Outreach (Addendum)
[facility]) Care Management  04/02/2019  Danielle Park 07-26-1950 [id number]                                      Medication Assistance Referral  Referral From: [facility] Embedded RPh Danielle T.   Medication/Company: Trulicity and Basaglar / [name] Patient application portion:  Mailed 04/03/2019 Provider application portion:  N/A Embedded pharmacist to have signed while in clinic to Dr. Birdie Sons Provider address/fax verified via: Office website    Follow up:  Will follow up with patient in 5-15 business days to confirm application(s) have been received.  Danielle Park, CPhT [facility] Care Management [phone]

## 2019-04-10 DIAGNOSIS — R809 Proteinuria, unspecified: Secondary | ICD-10-CM | POA: Diagnosis not present

## 2019-04-10 DIAGNOSIS — I10 Essential (primary) hypertension: Secondary | ICD-10-CM | POA: Diagnosis not present

## 2019-04-10 DIAGNOSIS — I701 Atherosclerosis of renal artery: Secondary | ICD-10-CM | POA: Diagnosis not present

## 2019-04-10 DIAGNOSIS — I129 Hypertensive chronic kidney disease with stage 1 through stage 4 chronic kidney disease, or unspecified chronic kidney disease: Secondary | ICD-10-CM | POA: Diagnosis not present

## 2019-04-10 DIAGNOSIS — E1122 Type 2 diabetes mellitus with diabetic chronic kidney disease: Secondary | ICD-10-CM | POA: Diagnosis not present

## 2019-04-10 DIAGNOSIS — E119 Type 2 diabetes mellitus without complications: Secondary | ICD-10-CM | POA: Diagnosis not present

## 2019-04-10 DIAGNOSIS — N183 Chronic kidney disease, stage 3 unspecified: Secondary | ICD-10-CM | POA: Diagnosis not present

## 2019-04-16 ENCOUNTER — Other Ambulatory Visit: Payer: Self-pay | Admitting: Pharmacy Technician

## 2019-04-16 NOTE — Patient Outreach (Signed)
[facility]) Care Management  04/16/2019  Danielle Park [birth year] [id number]  Unsuccessful outreach call placed to patient in regards to [employer] application for [employer].  Unfortunately patient did not answer the phone, HIPAA compliant voicemail left.  Was calling patient to inquire if she has received the application that was mailed to her on 04/03/2019.  Will follow up with 2nd outreach call in 5-7 business days if call is not returned.  [name] P. Cleopha Indelicato, CPhT [facility] Care Management [phone]

## 2019-04-17 DIAGNOSIS — N1831 Chronic kidney disease, stage 3a: Secondary | ICD-10-CM | POA: Diagnosis not present

## 2019-04-17 DIAGNOSIS — I129 Hypertensive chronic kidney disease with stage 1 through stage 4 chronic kidney disease, or unspecified chronic kidney disease: Secondary | ICD-10-CM | POA: Diagnosis not present

## 2019-04-17 DIAGNOSIS — E1122 Type 2 diabetes mellitus with diabetic chronic kidney disease: Secondary | ICD-10-CM | POA: Diagnosis not present

## 2019-04-17 DIAGNOSIS — I701 Atherosclerosis of renal artery: Secondary | ICD-10-CM | POA: Diagnosis not present

## 2019-04-17 DIAGNOSIS — R319 Hematuria, unspecified: Secondary | ICD-10-CM | POA: Diagnosis not present

## 2019-04-17 DIAGNOSIS — R809 Proteinuria, unspecified: Secondary | ICD-10-CM | POA: Diagnosis not present

## 2019-04-21 ENCOUNTER — Other Ambulatory Visit: Payer: Self-pay

## 2019-04-21 ENCOUNTER — Ambulatory Visit: Payer: PPO | Admitting: Podiatry

## 2019-04-21 ENCOUNTER — Ambulatory Visit: Payer: PPO | Admitting: Family Medicine

## 2019-04-21 DIAGNOSIS — T25211A Burn of second degree of right ankle, initial encounter: Secondary | ICD-10-CM

## 2019-04-21 DIAGNOSIS — M25571 Pain in right ankle and joints of right foot: Secondary | ICD-10-CM | POA: Diagnosis not present

## 2019-04-22 ENCOUNTER — Ambulatory Visit (INDEPENDENT_AMBULATORY_CARE_PROVIDER_SITE_OTHER): Payer: PPO

## 2019-04-22 ENCOUNTER — Encounter (INDEPENDENT_AMBULATORY_CARE_PROVIDER_SITE_OTHER): Payer: Self-pay | Admitting: Nurse Practitioner

## 2019-04-22 ENCOUNTER — Ambulatory Visit (INDEPENDENT_AMBULATORY_CARE_PROVIDER_SITE_OTHER): Payer: PPO | Admitting: Nurse Practitioner

## 2019-04-22 ENCOUNTER — Encounter: Payer: Self-pay | Admitting: Podiatry

## 2019-04-22 VITALS — BP 149/77 | HR 64 | Resp 12 | Ht 63.0 in | Wt 193.0 lb

## 2019-04-22 DIAGNOSIS — I701 Atherosclerosis of renal artery: Secondary | ICD-10-CM

## 2019-04-22 DIAGNOSIS — I150 Renovascular hypertension: Secondary | ICD-10-CM

## 2019-04-22 DIAGNOSIS — I7025 Atherosclerosis of native arteries of other extremities with ulceration: Secondary | ICD-10-CM | POA: Diagnosis not present
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
## 2019-04-24 ENCOUNTER — Encounter (INDEPENDENT_AMBULATORY_CARE_PROVIDER_SITE_OTHER): Payer: Self-pay | Admitting: Nurse Practitioner

## 2019-04-24 ENCOUNTER — Other Ambulatory Visit: Payer: Self-pay | Admitting: Pharmacy Technician

## 2019-04-24 ENCOUNTER — Other Ambulatory Visit: Payer: Self-pay | Admitting: Family Medicine
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
## 2019-04-24 NOTE — Patient Outreach (Signed)
[facility]) Care Management  04/24/2019  Danielle Park 06/14/1950 [id number]   Received both patient and provider portion(s) of patient assistance application(s) for [employer]. Faxed completed application and required documents into [name].  Will follow up with company(ies) in 7-10 business days to check status of application(s).  [name] P. [name], CPhT [facility] Care Management [phone]

## 2019-04-28 ENCOUNTER — Other Ambulatory Visit: Payer: Self-pay | Admitting: Family Medicine

## 2019-04-29 ENCOUNTER — Ambulatory Visit: Payer: PPO | Admitting: Podiatry

## 2019-04-29 ENCOUNTER — Other Ambulatory Visit: Payer: Self-pay

## 2019-04-29 DIAGNOSIS — T25211A Burn of second degree of right ankle, initial encounter: Secondary | ICD-10-CM

## 2019-04-29 DIAGNOSIS — M25571 Pain in right ankle and joints of right foot: Secondary | ICD-10-CM | POA: Diagnosis not present

## 2019-04-30 ENCOUNTER — Encounter: Payer: Self-pay | Admitting: Podiatry
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
## 2019-05-05 ENCOUNTER — Ambulatory Visit (INDEPENDENT_AMBULATORY_CARE_PROVIDER_SITE_OTHER): Payer: PPO | Admitting: Pharmacist

## 2019-05-05 ENCOUNTER — Other Ambulatory Visit: Payer: Self-pay | Admitting: Pharmacy Technician

## 2019-05-05 DIAGNOSIS — I150 Renovascular hypertension: Secondary | ICD-10-CM | POA: Diagnosis not present

## 2019-05-05 DIAGNOSIS — Z794 Long term (current) use of insulin: Secondary | ICD-10-CM | POA: Diagnosis not present

## 2019-05-05 DIAGNOSIS — E1122 Type 2 diabetes mellitus with diabetic chronic kidney disease: Secondary | ICD-10-CM

## 2019-05-05 MED ORDER — GLIPIZIDE 5 MG PO TABS: 5.0000 mg | ORAL_TABLET | Freq: Two times a day (BID) | ORAL | 1 refills | Status: DC
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
## 2019-05-05 NOTE — Patient Outreach (Signed)
[facility]) Care Management  05/05/2019  MALU PELLEGRINI Feb 26, 1951 [id number]  Care coordination call placed to [name] in regards to Trulicity and Basaglar application.  Spoke to [place] who informed the application is in the processing phase. He suggested to try back in a few days to inquire if a determination has been made.  Will follow up with [name] in 5-7 business days to inquire about application status.  Shivani Barrantes P. Jarah Pember, CPhT Musician Care Management [phone]
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
## 2019-05-08 ENCOUNTER — Other Ambulatory Visit: Payer: Self-pay | Admitting: Pharmacy Technician

## 2019-05-08 NOTE — Patient Outreach (Signed)
[facility]) Care Management  05/08/2019  Danielle Park April 13, 1950 [id number]    Received both patient and provider portion(s) of patient assistance application(s) for Basaglar. Faxed completed application and required documents into [name]. NOTE:Previously faxed Trulicity into [employer] on 1/28/20251  Will follow up with company(ies) in 7-14 business days to check status of application(s).  Danielle Park, CPhT Musician Care Management [phone]

## 2019-05-13 ENCOUNTER — Other Ambulatory Visit: Payer: Self-pay | Admitting: Pharmacy Technician

## 2019-05-13 NOTE — Patient Outreach (Signed)
[facility]) Care Management  05/13/2019  [name] 05-06-1950 [id number]    Care coordination call placed to [name] in regards to patient's application for Basaglar and Trulicity.  Spoke to [facility] who informed patient was APPROVED 05/06/2019-03/26/2020 for Trulicity. Inquired about the Basaglar application. Louis performed a search and was able to locate the prescription for Basaglar as well. He informed the approval dates would be the same. He informed this should be linked to her case in the next few days. He informed to have patient call into [name] if she is in need of refills for either medication.  Will outreach patient with this information.  Shaynna Husby P. Borghild Thaker, CPhT Musician Care Management [phone]

## 2019-05-13 NOTE — Patient Outreach (Signed)
[facility]) Care Management  05/13/2019  Danielle Park 07-13-1950 [id number]  ADDENDUM   Successful call placed to patient regarding patient assistance application(s) for Basaglar and Trulicity with [name] , HIPAA identifiers verified.   Patient informed she received the 1.5mg  Trulicity last week but that embedded [facility] RPh [name] had increased her to the 3mg . She informed she will use up what she has but was inquiring as to if the prescription could be updated with [name]. Informed patient that I would send an in basket message to embedded [facility] R[facility] Catie [facility].  Provided patient phone number to call [name] when she needs a refill of her Basaglar and to schedule shipment. Patient verbalized understanding. Confirmed patient had our name and number.  Follow up:  Will route note to embedded [facility] RPh Catie [facility]  for case closure and will remove myself from care team.   Will also note in my in basket message that an up to date Trulicity rx is needed with [name].  Tylene Quashie P. Lillien Petronio, CPhT [facility] Care Management [phone]

## 2019-05-15 ENCOUNTER — Ambulatory Visit: Payer: PPO | Admitting: Podiatry

## 2019-05-15 ENCOUNTER — Ambulatory Visit: Payer: Self-pay | Admitting: Pharmacist

## 2019-05-15 DIAGNOSIS — M545 Low back pain, unspecified: Secondary | ICD-10-CM

## 2019-05-15 DIAGNOSIS — E1122 Type 2 diabetes mellitus with diabetic chronic kidney disease: Secondary | ICD-10-CM

## 2019-05-15 DIAGNOSIS — Z794 Long term (current) use of insulin: Secondary | ICD-10-CM

## 2019-05-15 NOTE — Chronic Care Management (AMB) (Signed)
Chronic Care Management   Follow Up Note   05/15/2019 Name: Danielle Park MRN: [id number] DOB: December 17, 1950  Referred by: Danielle Luis, MD Reason for referral : Chronic Care Management (Medication Management)   Danielle Park is a 69 y.o. year old female who is a primary care patient of Danielle Park, Danielle Mao, MD. The CCM team was consulted for assistance with chronic disease management and care coordination needs.    Care coordination completed today.   Review of patient status, including review of consultants reports, relevant laboratory and other test results, and collaboration with appropriate care team members and the patient's provider was performed as part of comprehensive patient evaluation and provision of chronic care management services.      Outpatient Encounter Medications as of 05/15/2019  Medication Sig Note  . amLODipine (NORVASC) 10 MG tablet TAKE 1 TABLET BY MOUTH EVERY DAY   . aspirin 81 MG tablet Take 81 mg by mouth daily.   . cholecalciferol (VITAMIN D3) 25 MCG (1000 UT) tablet Take 2,000 Units by mouth daily.   . clopidogrel (PLAVIX) 75 MG tablet TAKE 1 TABLET (75 MG TOTAL) BY MOUTH DAILY.   Marland Kitchen D-5000 125 MCG (5000 UT) TABS Take 1 tablet by mouth once a week.   . Dulaglutide (TRULICITY) 3 MG/0.5ML SOPN Inject 3 mg into the skin once a week.   . furosemide (LASIX) 20 MG tablet TAKE 1 TABLET BY MOUTH EVERY DAY   . glipiZIDE (GLUCOTROL) 5 MG tablet Take 1 tablet (5 mg total) by mouth 2 (two) times daily before a meal.   . hydrALAZINE (APRESOLINE) 25 MG tablet TAKE 1 TABLET BY MOUTH THREE TIMES A DAY 12/23/2018: Taking BID (QAM, after supper)  . Insulin Glargine (BASAGLAR KWIKPEN) 100 UNIT/ML SOPN Inject 30 Units into the skin daily. 03/31/2019: 30 units daily   . Insulin Pen Needle (NOVOFINE) 32G X 6 MM MISC USE AS DIRECTED WITH LEVEMIR PEN. Please reschedule missed appt with PCP soon.   Marland Kitchen levothyroxine (SYNTHROID) 75 MCG tablet TAKE ONE TABLET BY MOUTH ONCE DAILY  BEFORE BREAKFAST   . lisinopril (ZESTRIL) 40 MG tablet TAKE 1 TABLET BY MOUTH EVERY DAY   . metoprolol tartrate (LOPRESSOR) 25 MG tablet TAKE 1 TABLET BY MOUTH TWICE A DAY   . rosuvastatin (CRESTOR) 10 MG tablet TAKE 1 TABLET BY MOUTH EVERY DAY    No facility-administered encounter medications on file as of 05/15/2019.     Objective:   Goals Addressed            This Visit's Progress     Patient Stated   . "I want to get my diabetes better controlled" (pt-stated)       CARE PLAN ENTRY (see longtitudinal plan of care for additional care plan information)  Current Barriers:  . Diabetes, controlled; most recent A1c 6.4%, though per SMBG likely less controlled now o Received message from CPhT that an updated prescription for Trulicity 3 mg is needed by [employer]  . 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 Cardiovascular risk reduction: o Current hypertensive regimen: lisinopril 40 mg QAM, amlodipine 10 mg QAM, furosemide 20 mg QAM; hydralazine 25 mg BID, metoprolol tartrate 25 mg BID; reports home BP remaining well controlled 100-130s/60-70s o Current hyperlipidemia regimen: rosuvastatin 10 mg daily, LDL well controlled <70  on last check o Antiplatelet therapy (PVD, renal artery stenting in 2014): clopidogrel 75 mg daily, ASA 81 mg daily  - both agents recommended by vascular  Pharmacist Clinical Goal(s):  Marland Kitchen Over the next 90 days, patient with work with PharmD and primary care provider to address optimized medication management  Interventions: . Contacted [employer], Chief of Staff. Provided verbal prescription for Trulicity 3 mg once weekly, 4 month supply with 2 refills under Dr. Caryl Bis.   Patient Self Care Activities:  . Patient will check  blood glucose BID, document, and provide at future appointments.  . Patient will take medications as prescribed . Patient will contact provider with any episodes of hypoglycemia . Patient will report any questions or concerns to provider   Please see past updates related to this goal by clicking on the "Past Updates" button in the selected goal          Plan:  - Will outreach patient as previously scheduled  Danielle Park, PharmD, [place] [phone]

## 2019-05-15 NOTE — Patient Instructions (Signed)
Visit Information  Goals Addressed            This Visit's Progress     Patient Stated   . "I want to get my diabetes better controlled" (pt-stated)       CARE PLAN ENTRY (see longtitudinal plan of care for additional care plan information)  Current Barriers:  . Diabetes, controlled; most recent A1c 6.4%, though per SMBG likely less controlled now o Received message from CPhT that an updated prescription for Trulicity 3 mg is needed by [employer]  . 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 Cardiovascular risk reduction: o Current hypertensive regimen: lisinopril 40 mg QAM, amlodipine 10 mg QAM, furosemide 20 mg QAM; hydralazine 25 mg BID, metoprolol tartrate 25 mg BID; reports home BP remaining well controlled 100-130s/60-70s o Current hyperlipidemia regimen: rosuvastatin 10 mg daily, LDL well controlled <70 on last check o Antiplatelet therapy (PVD, renal artery stenting in 2014): clopidogrel 75 mg daily, ASA 81 mg daily  - both agents recommended by vascular  Pharmacist Clinical Goal(s):  Marland Kitchen Over the next 90 days, patient with work with PharmD and primary care provider to address optimized medication management  Interventions: . Contacted [employer], Market researcher. Provided verbal prescription for Trulicity 3 mg once weekly, 4 month supply with 2 refills under Dr. Birdie Sons.   Patient Self Care Activities:  . Patient will check blood glucose BID, document, and provide at future appointments.  . Patient will take medications as prescribed . Patient will contact provider with any episodes of hypoglycemia . Patient will report any questions or concerns to provider   Please see past updates related to this goal by clicking on the "Past Updates"  button in the selected goal         Patient verbalizes understanding of instructions provided today.      Plan:  - Will outreach patient as previously scheduled  [name], PharmD, [facility] Clinical Pharmacist [facility] [phone]

## 2019-05-16 ENCOUNTER — Ambulatory Visit (INDEPENDENT_AMBULATORY_CARE_PROVIDER_SITE_OTHER): Payer: PPO | Admitting: Family Medicine

## 2019-05-16 ENCOUNTER — Encounter: Payer: Self-pay | Admitting: Family Medicine

## 2019-05-16 ENCOUNTER — Other Ambulatory Visit: Payer: Self-pay

## 2019-05-16 DIAGNOSIS — Z794 Long term (current) use of insulin: Secondary | ICD-10-CM

## 2019-05-16 DIAGNOSIS — I150 Renovascular hypertension: Secondary | ICD-10-CM

## 2019-05-16 DIAGNOSIS — E1121 Type 2 diabetes mellitus with diabetic nephropathy: Secondary | ICD-10-CM

## 2019-05-16 DIAGNOSIS — I701 Atherosclerosis of renal artery: Secondary | ICD-10-CM

## 2019-05-16 DIAGNOSIS — N1831 Chronic kidney disease, stage 3a: Secondary | ICD-10-CM | POA: Diagnosis not present

## 2019-05-16 DIAGNOSIS — T25211A Burn of second degree of right ankle, initial encounter: Secondary | ICD-10-CM | POA: Diagnosis not present

## 2019-05-16 NOTE — Assessment & Plan Note (Signed)
>>  ASSESSMENT AND PLAN FOR CONTROLLED DIABETES MELLITUS TYPE 2 WITH COMPLICATIONS (HCC) WRITTEN ON 05/16/2019 10:52 AM BY SONNENBERG, ERIC G, MD  Seems to be fairly well controlled based on home CBGs.  She will continue her current regimen.  We will have her in for an A1c.

## 2019-05-16 NOTE — Assessment & Plan Note (Signed)
Seems to be adequately controlled based on home blood pressures.  She will continue her current regimen.  She will continue to see vascular surgery.

## 2019-05-16 NOTE — Assessment & Plan Note (Signed)
She will continue to see vascular surgery. 
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
## 2019-05-16 NOTE — Assessment & Plan Note (Signed)
Seems to be fairly well controlled based on home CBGs.  She will continue her current regimen.  We will have her in for an A1c.

## 2019-05-16 NOTE — Assessment & Plan Note (Signed)
Progressively improving.  She will continue management through podiatry.

## 2019-05-20 ENCOUNTER — Other Ambulatory Visit: Payer: Self-pay

## 2019-05-20 ENCOUNTER — Ambulatory Visit: Payer: PPO | Admitting: Podiatry

## 2019-05-20 DIAGNOSIS — T25211A Burn of second degree of right ankle, initial encounter: Secondary | ICD-10-CM | POA: Diagnosis not present

## 2019-05-20 DIAGNOSIS — M25571 Pain in right ankle and joints of right foot: Secondary | ICD-10-CM

## 2019-05-21 ENCOUNTER — Encounter: Payer: Self-pay | Admitting: Podiatry
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
## 2019-05-23 ENCOUNTER — Other Ambulatory Visit: Payer: Self-pay

## 2019-05-23 ENCOUNTER — Other Ambulatory Visit (INDEPENDENT_AMBULATORY_CARE_PROVIDER_SITE_OTHER): Payer: PPO

## 2019-05-23 DIAGNOSIS — Z794 Long term (current) use of insulin: Secondary | ICD-10-CM | POA: Diagnosis not present

## 2019-05-23 DIAGNOSIS — E1121 Type 2 diabetes mellitus with diabetic nephropathy: Secondary | ICD-10-CM | POA: Diagnosis not present

## 2019-05-23 DIAGNOSIS — I150 Renovascular hypertension: Secondary | ICD-10-CM

## 2019-05-23 DIAGNOSIS — N1831 Chronic kidney disease, stage 3a: Secondary | ICD-10-CM

## 2019-05-23 LAB — LIPID PANEL
Cholesterol: 123 mg/dL (ref 0–200)
HDL: 41.3 mg/dL (ref >39.00)
LDL Cholesterol: 57 mg/dL (ref 0–99)
NonHDL: 81.72
Total CHOL/HDL Ratio: 3
Triglycerides: 124 mg/dL (ref 0.0–149.0)
VLDL: 24.8 mg/dL (ref 0.0–40.0)

## 2019-05-23 LAB — HEPATIC FUNCTION PANEL
ALT: 7 U/L (ref 0–35)
AST: 16 U/L (ref 0–37)
Albumin: 3.8 g/dL (ref 3.5–5.2)
Alkaline Phosphatase: 82 U/L (ref 39–117)
Bilirubin, Direct: 0.1 mg/dL (ref 0.0–0.3)
Total Bilirubin: 0.4 mg/dL (ref 0.2–1.2)
Total Protein: 7.4 g/dL (ref 6.0–8.3)

## 2019-05-23 LAB — HEMOGLOBIN A1C: Hgb A1c MFr Bld: 7 % — ABNORMAL HIGH (ref 4.6–6.5)

## 2019-06-09 ENCOUNTER — Other Ambulatory Visit: Payer: Self-pay | Admitting: Family Medicine

## 2019-06-16 ENCOUNTER — Ambulatory Visit (INDEPENDENT_AMBULATORY_CARE_PROVIDER_SITE_OTHER): Payer: PPO | Admitting: Pharmacist

## 2019-06-16 ENCOUNTER — Other Ambulatory Visit: Payer: Self-pay | Admitting: Family Medicine

## 2019-06-16 DIAGNOSIS — E1121 Type 2 diabetes mellitus with diabetic nephropathy: Secondary | ICD-10-CM | POA: Diagnosis not present

## 2019-06-16 DIAGNOSIS — Z794 Long term (current) use of insulin: Secondary | ICD-10-CM

## 2019-06-16 DIAGNOSIS — N1831 Chronic kidney disease, stage 3a: Secondary | ICD-10-CM | POA: Diagnosis not present

## 2019-06-16 DIAGNOSIS — N1832 Chronic kidney disease, stage 3b: Secondary | ICD-10-CM

## 2019-06-16 MED ORDER — JARDIANCE 10 MG PO TABS: 10.0000 mg | ORAL_TABLET | Freq: Every day | ORAL | 2 refills | Status: DC

## 2019-06-16 NOTE — Chronic Care Management (AMB) (Signed)
Chronic Care Management   Follow Up Note   06/16/2019 Name: CADEY Park MRN: [id number] DOB: 1950/11/21  Referred by: Danielle Haven, MD Reason for referral : Chronic Care Management (Medication Management)   Danielle Park is a 69 y.o. year old female who is a primary care patient of Danielle Park, Danielle Adam, MD. The CCM team was consulted for assistance with chronic disease management and care coordination needs.    Contacted patient for medication management review.  Review of patient status, including review of consultants reports, relevant laboratory and other test results, and collaboration with appropriate care team members and the patient's provider was performed as part of comprehensive patient evaluation and provision of chronic care management services.    SDOH (Social Determinants of Health) assessments performed: Yes See Care Plan activities for detailed interventions related to [facility])     Outpatient Encounter Medications as of 06/16/2019  Medication Sig Note  . amLODipine (NORVASC) 10 MG tablet TAKE 1 TABLET BY MOUTH EVERY DAY   . aspirin 81 MG tablet Take 81 mg by mouth daily.   . cholecalciferol (VITAMIN D3) 25 MCG (1000 UT) tablet Take 2,000 Units by mouth daily.   . clopidogrel (PLAVIX) 75 MG tablet TAKE 1 TABLET (75 MG TOTAL) BY MOUTH DAILY.   Marland Kitchen D-5000 125 MCG (5000 UT) TABS Take 1 tablet by mouth once a week.   . Dulaglutide (TRULICITY) 3 HY/8.6VH SOPN Inject 3 mg into the skin once a week.   . furosemide (LASIX) 20 MG tablet TAKE 1 TABLET BY MOUTH EVERY DAY   . glipiZIDE (GLUCOTROL) 5 MG tablet Take 1 tablet (5 mg total) by mouth 2 (two) times daily before a meal.   . hydrALAZINE (APRESOLINE) 25 MG tablet TAKE 1 TABLET BY MOUTH THREE TIMES A DAY   . Insulin Glargine (BASAGLAR KWIKPEN) 100 UNIT/ML SOPN Inject 30 Units into the skin daily. 03/31/2019: 30 units daily   . levothyroxine (SYNTHROID) 75 MCG tablet TAKE ONE TABLET BY MOUTH ONCE DAILY BEFORE BREAKFAST   .  lisinopril (ZESTRIL) 40 MG tablet TAKE 1 TABLET BY MOUTH EVERY DAY   . metoprolol tartrate (LOPRESSOR) 25 MG tablet TAKE 1 TABLET BY MOUTH TWICE A DAY   . rosuvastatin (CRESTOR) 10 MG tablet TAKE 1 TABLET BY MOUTH EVERY DAY   . empagliflozin (JARDIANCE) 10 MG TABS tablet Take 10 mg by mouth daily before breakfast.   . Insulin Pen Needle (NOVOFINE) 32G X 6 MM MISC USE AS DIRECTED WITH LEVEMIR PEN. Please reschedule missed appt with PCP soon.    No facility-administered encounter medications on file as of 06/16/2019.     Objective:   Goals Addressed            This Visit's Progress     Patient Stated   . "I want to get my diabetes better controlled" (pt-stated)       [place] (see longtitudinal plan of care for additional care plan information)  Current Barriers:  . Diabetes, controlled; most recent A1c 7% o Previously discussed w/ Dr. Juleen China, he noted that he agreed w/ addition of SGLT2 for renal protection (per phone note, he is OK with it as long as eGFR >30) . Most recent eGFR 43 . Current antihyperglycemic regimen: Trulicity 3 mg once weekly, Basaglar 30 units once daily; glipizide 5 mg BID o Per notes from previous PCP, avoiding metformin use d/t congential single kidney o APPROVED for Trulicity and Engineer, agricultural assistance through [employer] through 03/26/20  .  Current glucose readings:  Fasting Post Supper  _0 210  96   130 186   . Cardiovascular risk reduction: o Current hypertensive regimen: lisinopril 40 mg QAM, amlodipine 10 mg QAM, furosemide 20 mg QAM; hydralazine 25 mg BID, metoprolol tartrate 25 mg BID; o Current hyperlipidemia regimen: rosuvastatin 10 mg daily, LDL well controlled <70 on last check o Antiplatelet therapy (PVD, renal artery stenting in 2014): clopidogrel 75 mg daily, ASA 81 mg daily  - both agents recommended by  vascular  Pharmacist Clinical Goal(s):  Marland Kitchen Over the next 90 days, patient with work with PharmD and primary care provider to address optimized medication management  Interventions: . Comprehensive medication review performed, medication list updated in electronic medical record . Reviewed goal A1c, goal fasting glucose, and goal 2 hour post prandial glucose . Discussed previous conversation w/ Dr. Juleen China and renal benefits of SGLT2. Discussed that patient's average fasting is right at goal, while average 2 hour post prandial is right above goal. Discussed long term effects of sulfonylureas (weight gain, hypoglycemia), and that preferred option would be to use SGLT2 to minimize/eliminate sulfonylurea. Patient verbalized understanding and agreement . Start Jardiance 10 mg daily. Counseled to take in the morning and remain well hydrated. Counseled on risk of genitourinary infections.  . Counseled that if daytime hypoglycemia develops to d/c AM glipizide dose. . Moving forward, will pursue patient assistance for Jardiance, once patient demonstrates efficacy and tolerability   Patient Self Care Activities:  . Patient will check blood glucose BID, document, and provide at future appointments.  . Patient will take medications as prescribed . Patient will contact provider with any episodes of hypoglycemia . Patient will report any questions or concerns to provider   Please see past updates related to this goal by clicking on the "Past Updates" button in the selected goal          Plan:  - Scheduled f/u call 07/24/19  Danielle Park, PharmD, BCACP, [place] Pharmacist [place] [phone]

## 2019-06-16 NOTE — Patient Instructions (Signed)
Visit Information  Goals Addressed            This Visit's Progress     Patient Stated   . "I want to get my diabetes better controlled" (pt-stated)       [place] (see longtitudinal plan of care for additional care plan information)  Current Barriers:  . Diabetes, controlled; most recent A1c 7% o Previously discussed w/ Dr. Juleen China, he noted that he agreed w/ addition of SGLT2 for renal protection (per phone note, he is OK with it as long as eGFR >30) . Most recent eGFR 43 . Current antihyperglycemic regimen: Trulicity 3 mg once weekly, Basaglar 30 units once daily; glipizide 5 mg BID o Per notes from previous PCP, avoiding metformin use d/t congential single kidney o APPROVED for Trulicity and Engineer, agricultural assistance through [employer] through 03/26/20  . Current glucose readings:  Fasting Post Supper  _0 210  96   130 186   . Cardiovascular risk reduction: o Current hypertensive regimen: lisinopril 40 mg QAM, amlodipine 10 mg QAM, furosemide 20 mg QAM; hydralazine 25 mg BID, metoprolol tartrate 25 mg BID; o Current hyperlipidemia regimen: rosuvastatin 10 mg daily, LDL well controlled <70 on last check o Antiplatelet therapy (PVD, renal artery stenting in 2014): clopidogrel 75 mg daily, ASA 81 mg daily  - both agents recommended by vascular  Pharmacist Clinical Goal(s):  Marland Kitchen Over the next 90 days, patient with work with PharmD and primary care provider to address optimized medication management  Interventions: . Comprehensive medication review performed, medication list updated in electronic medical record . Reviewed goal A1c, goal fasting glucose, and goal 2 hour post prandial glucose . Discussed previous conversation w/ Dr. Juleen China and renal benefits of SGLT2. Discussed that patient's average fasting is right at goal, while average 2 hour post prandial is right  above goal. Discussed long term effects of sulfonylureas (weight gain, hypoglycemia), and that preferred option would be to use SGLT2 to minimize/eliminate sulfonylurea. Patient verbalized understanding and agreement . Start Jardiance 10 mg daily. Counseled to take in the morning and remain well hydrated. Counseled on risk of genitourinary infections.  . Counseled that if daytime hypoglycemia develops to d/c AM glipizide dose. . Moving forward, will pursue patient assistance for Jardiance, once patient demonstrates efficacy and tolerability   Patient Self Care Activities:  . Patient will check blood glucose BID, document, and provide at future appointments.  . Patient will take medications as prescribed . Patient will contact provider with any episodes of hypoglycemia . Patient will report any questions or concerns to provider   Please see past updates related to this goal by clicking on the "Past Updates" button in the selected goal         Patient verbalizes understanding of instructions provided today.   Plan:  - Scheduled f/u call 07/24/19  [name], PharmD, BCACP, CPP Clinical Pharmacist [place] [phone]

## 2019-06-17 ENCOUNTER — Other Ambulatory Visit: Payer: Self-pay

## 2019-06-17 ENCOUNTER — Ambulatory Visit (INDEPENDENT_AMBULATORY_CARE_PROVIDER_SITE_OTHER): Payer: PPO | Admitting: Podiatry

## 2019-06-17 DIAGNOSIS — M25571 Pain in right ankle and joints of right foot: Secondary | ICD-10-CM | POA: Diagnosis not present

## 2019-06-17 DIAGNOSIS — T25211A Burn of second degree of right ankle, initial encounter: Secondary | ICD-10-CM | POA: Diagnosis not present

## 2019-06-18 ENCOUNTER — Encounter: Payer: Self-pay | Admitting: Podiatry
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
## 2019-06-26 ENCOUNTER — Ambulatory Visit: Payer: PPO | Attending: Internal Medicine

## 2019-06-26 DIAGNOSIS — Z23 Encounter for immunization: Secondary | ICD-10-CM

## 2019-06-26 NOTE — Progress Notes (Signed)
   Covid-19 Vaccination Clinic  Name:  Danielle Park    MRN: [id number] DOB: 1950-06-27  06/26/2019  Ms. Wake was observed post Covid-19 immunization for 15 minutes without incident. She was provided with Vaccine Information Sheet and instruction to access the V-Safe system.   [name] was instructed to call 911 with any severe reactions post vaccine: Marland Kitchen Difficulty breathing  . Swelling of face and throat  . A fast heartbeat  . A bad rash all over body  . Dizziness and weakness   Immunizations Administered    Name Date Dose VIS Date Route   Pfizer COVID-19 Vaccine 06/26/2019  9:06 AM 0.3 mL 03/07/2019 Intramuscular   Manufacturer: [employer], [employer]   Lot: [id number]   NDC: [address]-7

## 2019-07-16 ENCOUNTER — Other Ambulatory Visit: Payer: Self-pay | Admitting: Family Medicine

## 2019-07-21 ENCOUNTER — Ambulatory Visit: Payer: PPO | Admitting: Podiatry

## 2019-07-22 ENCOUNTER — Ambulatory Visit: Payer: PPO | Admitting: Podiatry

## 2019-07-23 ENCOUNTER — Other Ambulatory Visit: Payer: Self-pay

## 2019-07-23 ENCOUNTER — Ambulatory Visit: Payer: PPO | Attending: Internal Medicine

## 2019-07-23 DIAGNOSIS — Z23 Encounter for immunization: Secondary | ICD-10-CM

## 2019-07-23 NOTE — Progress Notes (Signed)
   Covid-19 Vaccination Clinic  Name:  BAYA LENTZ    MRN: [id number] DOB: 05-19-1950  07/23/2019  Ms. Kozicki was observed post Covid-19 immunization for 15 minutes without incident. She was provided with Vaccine Information Sheet and instruction to access the V-Safe system.   Ms. Lai was instructed to call 911 with any severe reactions post vaccine: Marland Kitchen Difficulty breathing  . Swelling of face and throat  . A fast heartbeat  . A bad rash all over body  . Dizziness and weakness   Immunizations Administered    Name Date Dose VIS Date Route   Pfizer COVID-19 Vaccine 07/23/2019  8:21 AM 0.3 mL 05/21/2018 Intramuscular   Manufacturer: [employer], [employer]   Lot: [id number]   NDC: [address]-9

## 2019-07-24 ENCOUNTER — Ambulatory Visit (INDEPENDENT_AMBULATORY_CARE_PROVIDER_SITE_OTHER): Payer: PPO | Admitting: Pharmacist

## 2019-07-24 DIAGNOSIS — N1831 Chronic kidney disease, stage 3a: Secondary | ICD-10-CM

## 2019-07-24 DIAGNOSIS — Z794 Long term (current) use of insulin: Secondary | ICD-10-CM | POA: Diagnosis not present

## 2019-07-24 DIAGNOSIS — E1121 Type 2 diabetes mellitus with diabetic nephropathy: Secondary | ICD-10-CM

## 2019-07-24 MED ORDER — JARDIANCE 25 MG PO TABS: 25.0000 mg | ORAL_TABLET | Freq: Every day | ORAL | 3 refills | Status: DC

## 2019-07-24 NOTE — Chronic Care Management (AMB) (Signed)
Chronic Care Management   Follow Up Note   07/24/2019 Name: Danielle Park MRN: [id number] DOB: 10-May-1950  Referred by: Leone Haven, MD Reason for referral : Chronic Care Management (Medication Management)   Danielle Park is a 69 y.o. year old female who is a primary care patient of Caryl Bis, Angela Adam, MD. The CCM team was consulted for assistance with chronic disease management and care coordination needs.    Contacted patient for medication management review.   Review of patient status, including review of consultants reports, relevant laboratory and other test results, and collaboration with appropriate care team members and the patient's provider was performed as part of comprehensive patient evaluation and provision of chronic care management services.    SDOH (Social Determinants of Health) assessments performed: No See Care Plan activities for detailed interventions related to [facility])     Outpatient Encounter Medications as of 07/24/2019  Medication Sig Note  . amLODipine (NORVASC) 10 MG tablet TAKE 1 TABLET BY MOUTH EVERY DAY   . aspirin 81 MG tablet Take 81 mg by mouth daily.   . cholecalciferol (VITAMIN D3) 25 MCG (1000 UT) tablet Take 2,000 Units by mouth daily.   . clopidogrel (PLAVIX) 75 MG tablet TAKE 1 TABLET (75 MG TOTAL) BY MOUTH DAILY.   . Dulaglutide (TRULICITY) 3 LG/9.2JJ SOPN Inject 3 mg into the skin once a week.   . furosemide (LASIX) 20 MG tablet TAKE 1 TABLET BY MOUTH EVERY DAY   . glipiZIDE (GLUCOTROL) 5 MG tablet Take 1 tablet (5 mg total) by mouth 2 (two) times daily before a meal.   . hydrALAZINE (APRESOLINE) 25 MG tablet TAKE 1 TABLET BY MOUTH THREE TIMES A DAY 07/24/2019: BID  . Insulin Glargine (BASAGLAR KWIKPEN) 100 UNIT/ML SOPN Inject 30 Units into the skin daily. 03/31/2019: 30 units daily   . Insulin Pen Needle (NOVOFINE) 32G X 6 MM MISC USE AS DIRECTED WITH LEVEMIR PEN. Please reschedule missed appt with PCP soon.   Marland Kitchen levothyroxine (SYNTHROID)  75 MCG tablet TAKE ONE TABLET BY MOUTH ONCE DAILY BEFORE BREAKFAST   . lisinopril (ZESTRIL) 40 MG tablet TAKE 1 TABLET BY MOUTH EVERY DAY   . metoprolol tartrate (LOPRESSOR) 25 MG tablet TAKE 1 TABLET BY MOUTH TWICE A DAY   . rosuvastatin (CRESTOR) 10 MG tablet TAKE 1 TABLET BY MOUTH EVERY DAY   . [DISCONTINUED] empagliflozin (JARDIANCE) 10 MG TABS tablet Take 10 mg by mouth daily before breakfast.   . empagliflozin (JARDIANCE) 25 MG TABS tablet Take 25 mg by mouth daily before breakfast.   . [DISCONTINUED] D-5000 125 MCG (5000 UT) TABS Take 1 tablet by mouth once a week.    No facility-administered encounter medications on file as of 07/24/2019.     Objective:   Goals Addressed            This Visit's Progress     Patient Stated   . "I want to get my diabetes better controlled" (pt-stated)       CARE PLAN ENTRY (see longtitudinal plan of care for additional care plan information)  Current Barriers:  . Diabetes, controlled; most recent A1c 7% o Started Jardiance at last visit. Patient denies GU side effects.  . Most recent eGFR 43 . Current antihyperglycemic regimen: Trulicity 3 mg once weekly, Basaglar 30 units once daily; glipizide 5 mg BID, Jardiance 10 mg   o Per notes from previous PCP, avoiding metformin use d/t congential single kidney o APPROVED for Trulicity and [employer]  assistance through [employer] through 03/26/20  . Denies episodes of hypoglycemia . Current glucose readings:   Fasting After Supper   10-Apr 80 187   11-Apr 80 198   12-Apr 99 121   13-Apr 99 189   14-Apr 95 153   15-Apr 83 187   16-Apr 93 180   17-Apr 95 160   18-Apr 98 230 high carb day  19-Apr 97 221   20-Apr 100 148   21-Apr 89 187   22-Apr 87 175   23-Apr 76 165   24-Apr 90 190   25-Apr 92 119   26-Apr 90 160   27-Apr 93    AVG 91 174  .  Cardiovascular risk reduction: o Current hypertensive regimen: lisinopril 40 mg QAM, amlodipine 10 mg QAM, furosemide 20 mg QAM; hydralazine 25  mg BID, metoprolol tartrate 25 mg BID; - Home BP: generally 120s-60s o Current hyperlipidemia regimen: rosuvastatin 10 mg daily, LDL well controlled <70 on last check o Antiplatelet therapy (PVD, renal artery stenting in 2014): clopidogrel 75 mg daily, ASA 81 mg daily  - both agents recommended by vascular . Hypothyroidism: levothyroxine 75 mcg daily   Pharmacist Clinical Goal(s):  Marland Kitchen Over the next 90 days, patient with work with PharmD and primary care provider to address optimized medication management  Interventions: . Comprehensive medication review performed, medication list updated in electronic medical record . Inter-disciplinary care team collaboration (see longitudinal plan of care) . Reviewed BG readings. For future ability to decrease insulin/sulfonylurea, will increase Jardiance to 25 mg daily today. Patient informed she can take 2 10 mg tabs daily to use current supply. She denies needing to pursue patient assistance at this time.  . Patient counseled to call me if she develops hypoglycemia. If fasting hypoglycemia, recommend decreasing Basaglar. If post-prandial, recommend decreasing glipizide. . Reviewed goal A1c, goal fasting glucose, and goal 2 hour post prandial glucose.  . Reviewed goal BP. Patient at goal, continue current regimen. Patient up to date on RAAS fill . Reviewed goal LDL. Patient at goal, continue current regimen. Patient up to date on statin fill   Patient Self Care Activities:  . Patient will check blood glucose BID, document, and provide at future appointments.  . Patient will take medications as prescribed . Patient will contact provider with any episodes of hypoglycemia . Patient will report any questions or concerns to provider   Please see past updates related to this goal by clicking on the "Past Updates" button in the selected goal          Plan:  - Patient has PCP f/u in ~ 4 weeks. Scheduled f/u with me in ~8 weeks after that.   [name],  PharmD, [place], CPP Clinical Pharmacist [place] [phone]

## 2019-07-24 NOTE — Patient Instructions (Signed)
Visit Information  Goals Addressed            This Visit's Progress     Patient Stated   . "I want to get my diabetes better controlled" (pt-stated)       CARE PLAN ENTRY (see longtitudinal plan of care for additional care plan information)  Current Barriers:  . Diabetes, controlled; most recent A1c 7% o Started Jardiance at last visit. Patient denies GU side effects.  . Most recent eGFR 43 . Current antihyperglycemic regimen: Trulicity 3 mg once weekly, Basaglar 30 units once daily; glipizide 5 mg BID, Jardiance 10 mg   o Per notes from previous PCP, avoiding metformin use d/t congential single kidney o APPROVED for Trulicity and [employer], [employer] assistance through [employer] through 03/26/20  . Denies episodes of hypoglycemia . Current glucose readings:   Fasting After Supper   10-Apr 80 187   11-Apr 80 198   12-Apr 99 121   13-Apr 99 189   14-Apr 95 153   15-Apr 83 187   16-Apr 93 180   17-Apr 95 160   18-Apr 98 230 high carb day  19-Apr 97 221   20-Apr 100 148   21-Apr 89 187   22-Apr 87 175   23-Apr 76 165   24-Apr 90 190   25-Apr 92 119   26-Apr 90 160   27-Apr 93    AVG 91 174  .  Cardiovascular risk reduction: o Current hypertensive regimen: lisinopril 40 mg QAM, amlodipine 10 mg QAM, furosemide 20 mg QAM; hydralazine 25 mg BID, metoprolol tartrate 25 mg BID; - Home BP: generally 120s-60s o Current hyperlipidemia regimen: rosuvastatin 10 mg daily, LDL well controlled <70 on last check o Antiplatelet therapy (PVD, renal artery stenting in 2014): clopidogrel 75 mg daily, ASA 81 mg daily  - both agents recommended by vascular . Hypothyroidism: levothyroxine 75 mcg daily   Pharmacist Clinical Goal(s):  Marland Kitchen Over the next 90 days, patient with work with PharmD and primary care provider to address optimized medication management  Interventions: . Comprehensive medication review performed, medication list updated in electronic medical record . Inter-disciplinary care  team collaboration (see longitudinal plan of care) . Reviewed BG readings. For future ability to decrease insulin/sulfonylurea, will increase Jardiance to 25 mg daily today. Patient informed she can take 2 10 mg tabs daily to use current supply. She denies needing to pursue patient assistance at this time.  . Patient counseled to call me if she develops hypoglycemia. If fasting hypoglycemia, recommend decreasing Basaglar. If post-prandial, recommend decreasing glipizide. . Reviewed goal A1c, goal fasting glucose, and goal 2 hour post prandial glucose.  . Reviewed goal BP. Patient at goal, continue current regimen. Patient up to date on RAAS fill . Reviewed goal LDL. Patient at goal, continue current regimen. Patient up to date on statin fill   Patient Self Care Activities:  . Patient will check blood glucose BID, document, and provide at future appointments.  . Patient will take medications as prescribed . Patient will contact provider with any episodes of hypoglycemia . Patient will report any questions or concerns to provider   Please see past updates related to this goal by clicking on the "Past Updates" button in the selected goal         Patient verbalizes understanding of instructions provided today.   Plan:  - Patient has PCP f/u in ~ 4 weeks. Scheduled f/u with me in ~8 weeks after that.   [name], PharmD,  BCACP, CPP Clinical Pharmacist [place] [phone]

## 2019-08-15 ENCOUNTER — Encounter: Payer: Self-pay | Admitting: Family Medicine

## 2019-08-15 ENCOUNTER — Other Ambulatory Visit: Payer: Self-pay

## 2019-08-15 ENCOUNTER — Ambulatory Visit (INDEPENDENT_AMBULATORY_CARE_PROVIDER_SITE_OTHER): Payer: PPO | Admitting: Family Medicine

## 2019-08-15 DIAGNOSIS — F5101 Primary insomnia: Secondary | ICD-10-CM | POA: Diagnosis not present

## 2019-08-15 DIAGNOSIS — N1831 Chronic kidney disease, stage 3a: Secondary | ICD-10-CM

## 2019-08-15 DIAGNOSIS — E1121 Type 2 diabetes mellitus with diabetic nephropathy: Secondary | ICD-10-CM

## 2019-08-15 DIAGNOSIS — Z794 Long term (current) use of insulin: Secondary | ICD-10-CM | POA: Diagnosis not present

## 2019-08-15 DIAGNOSIS — I150 Renovascular hypertension: Secondary | ICD-10-CM | POA: Diagnosis not present

## 2019-08-15 DIAGNOSIS — G4700 Insomnia, unspecified: Secondary | ICD-10-CM | POA: Insufficient documentation

## 2019-08-15 DIAGNOSIS — E1122 Type 2 diabetes mellitus with diabetic chronic kidney disease: Secondary | ICD-10-CM

## 2019-08-15 MED ORDER — TRAZODONE HCL 50 MG PO TABS: 25.0000 mg | ORAL_TABLET | Freq: Every evening | ORAL | 3 refills | Status: DC | PRN
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
## 2019-08-15 NOTE — Assessment & Plan Note (Signed)
Well-controlled at home.  Continue current medication. 

## 2019-08-15 NOTE — Patient Instructions (Addendum)
Nice to see you. We will try trazodone for your sleep. Please cut out screen time the hour before getting in bed. Please return for labs.

## 2019-08-15 NOTE — Assessment & Plan Note (Signed)
>>  ASSESSMENT AND PLAN FOR CONTROLLED DIABETES MELLITUS TYPE 2 WITH COMPLICATIONS (HCC) WRITTEN ON 08/15/2019 11:07 AM BY SONNENBERG, ERIC G, MD  Well-controlled fastings.  She will continue her current regimen.  We will have her return for a A1c.  Encouraged her to see ophthalmology.

## 2019-08-15 NOTE — Assessment & Plan Note (Signed)
Well-controlled fastings.  She will continue her current regimen.  We will have her return for a A1c.  Encouraged her to see ophthalmology.

## 2019-08-15 NOTE — Assessment & Plan Note (Signed)
Chronic issue.  Discussed sleep hygiene measures.  Encouraged to cut screen time out.  We will trial trazodone for sleep.

## 2019-08-20 ENCOUNTER — Other Ambulatory Visit: Payer: Self-pay | Admitting: Family Medicine

## 2019-08-22 ENCOUNTER — Other Ambulatory Visit (INDEPENDENT_AMBULATORY_CARE_PROVIDER_SITE_OTHER): Payer: PPO

## 2019-08-22 ENCOUNTER — Other Ambulatory Visit: Payer: Self-pay

## 2019-08-22 DIAGNOSIS — Z794 Long term (current) use of insulin: Secondary | ICD-10-CM | POA: Diagnosis not present

## 2019-08-22 DIAGNOSIS — E1121 Type 2 diabetes mellitus with diabetic nephropathy: Secondary | ICD-10-CM

## 2019-08-22 DIAGNOSIS — N1831 Chronic kidney disease, stage 3a: Secondary | ICD-10-CM

## 2019-08-22 DIAGNOSIS — I150 Renovascular hypertension: Secondary | ICD-10-CM

## 2019-08-22 DIAGNOSIS — E1122 Type 2 diabetes mellitus with diabetic chronic kidney disease: Secondary | ICD-10-CM

## 2019-08-22 LAB — BASIC METABOLIC PANEL
BUN: 23 mg/dL (ref 6–23)
CO2: 26 mEq/L (ref 19–32)
Calcium: 9.4 mg/dL (ref 8.4–10.5)
Chloride: 103 mEq/L (ref 96–112)
Creatinine, Ser: 1.31 mg/dL — ABNORMAL HIGH (ref 0.40–1.20)
GFR: 40.3 mL/min — ABNORMAL LOW (ref >60.00)
Glucose, Bld: 153 mg/dL — ABNORMAL HIGH (ref 70–99)
Potassium: 4.7 mEq/L (ref 3.5–5.1)
Sodium: 136 mEq/L (ref 135–145)

## 2019-08-22 LAB — HEMOGLOBIN A1C: Hgb A1c MFr Bld: 6.1 % (ref 4.6–6.5)

## 2019-08-29 ENCOUNTER — Ambulatory Visit
Admission: EM | Admit: 2019-08-29 | Discharge: 2019-08-29 | Disposition: A | Discharge status: Home / Self Care | Payer: PPO

## 2019-08-29 DIAGNOSIS — I10 Essential (primary) hypertension: Secondary | ICD-10-CM | POA: Diagnosis not present

## 2019-08-29 DIAGNOSIS — R590 Localized enlarged lymph nodes: Secondary | ICD-10-CM

## 2019-08-29 NOTE — ED Triage Notes (Signed)
Pt c/o soreness to left side of neck, states turning head side to side does not change pain. States, "When I touch it, it feels like the skin is sunburned"
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
## 2019-08-29 NOTE — Discharge Instructions (Addendum)
Take Tylenol as needed for discomfort.  Follow up with your primary care provider in 1 week if your symptoms persist.  Follow up sooner if you develop fever, ear pain, sore throat, cough, or other concerning symptoms.    Your blood pressure is elevated today at 177/75.  Please have this rechecked by your primary care provider in 2-4 weeks.

## 2019-09-06 ENCOUNTER — Other Ambulatory Visit: Payer: Self-pay | Admitting: Family Medicine

## 2019-09-15 ENCOUNTER — Other Ambulatory Visit: Payer: Self-pay | Admitting: Family Medicine

## 2019-09-30 ENCOUNTER — Ambulatory Visit: Payer: PPO

## 2019-10-09 ENCOUNTER — Other Ambulatory Visit: Payer: Self-pay | Admitting: Family Medicine

## 2019-10-17 DIAGNOSIS — I129 Hypertensive chronic kidney disease with stage 1 through stage 4 chronic kidney disease, or unspecified chronic kidney disease: Secondary | ICD-10-CM | POA: Diagnosis not present

## 2019-10-17 DIAGNOSIS — R319 Hematuria, unspecified: Secondary | ICD-10-CM | POA: Diagnosis not present

## 2019-10-17 DIAGNOSIS — R809 Proteinuria, unspecified: Secondary | ICD-10-CM | POA: Diagnosis not present

## 2019-10-17 DIAGNOSIS — E1122 Type 2 diabetes mellitus with diabetic chronic kidney disease: Secondary | ICD-10-CM | POA: Diagnosis not present

## 2019-10-17 DIAGNOSIS — N1831 Chronic kidney disease, stage 3a: Secondary | ICD-10-CM | POA: Diagnosis not present

## 2019-10-17 DIAGNOSIS — I701 Atherosclerosis of renal artery: Secondary | ICD-10-CM | POA: Diagnosis not present

## 2019-10-20 ENCOUNTER — Other Ambulatory Visit (INDEPENDENT_AMBULATORY_CARE_PROVIDER_SITE_OTHER): Payer: Self-pay | Admitting: Nurse Practitioner

## 2019-10-20 ENCOUNTER — Ambulatory Visit (INDEPENDENT_AMBULATORY_CARE_PROVIDER_SITE_OTHER): Payer: PPO | Admitting: Pharmacist

## 2019-10-20 ENCOUNTER — Other Ambulatory Visit (INDEPENDENT_AMBULATORY_CARE_PROVIDER_SITE_OTHER): Payer: Self-pay | Admitting: Urology

## 2019-10-20 DIAGNOSIS — I7025 Atherosclerosis of native arteries of other extremities with ulceration: Secondary | ICD-10-CM

## 2019-10-20 DIAGNOSIS — I701 Atherosclerosis of renal artery: Secondary | ICD-10-CM | POA: Diagnosis not present

## 2019-10-20 DIAGNOSIS — R319 Hematuria, unspecified: Secondary | ICD-10-CM | POA: Diagnosis not present

## 2019-10-20 DIAGNOSIS — N1831 Chronic kidney disease, stage 3a: Secondary | ICD-10-CM | POA: Diagnosis not present

## 2019-10-20 DIAGNOSIS — Z794 Long term (current) use of insulin: Secondary | ICD-10-CM

## 2019-10-20 DIAGNOSIS — R809 Proteinuria, unspecified: Secondary | ICD-10-CM | POA: Diagnosis not present

## 2019-10-20 DIAGNOSIS — E1121 Type 2 diabetes mellitus with diabetic nephropathy: Secondary | ICD-10-CM

## 2019-10-20 DIAGNOSIS — I150 Renovascular hypertension: Secondary | ICD-10-CM | POA: Diagnosis not present

## 2019-10-20 DIAGNOSIS — N10 Acute tubulo-interstitial nephritis: Secondary | ICD-10-CM | POA: Diagnosis not present

## 2019-10-20 DIAGNOSIS — E1122 Type 2 diabetes mellitus with diabetic chronic kidney disease: Secondary | ICD-10-CM

## 2019-10-20 DIAGNOSIS — I739 Peripheral vascular disease, unspecified: Secondary | ICD-10-CM

## 2019-10-20 DIAGNOSIS — I129 Hypertensive chronic kidney disease with stage 1 through stage 4 chronic kidney disease, or unspecified chronic kidney disease: Secondary | ICD-10-CM | POA: Diagnosis not present

## 2019-10-20 MED ORDER — BASAGLAR KWIKPEN 100 UNIT/ML ~~LOC~~ SOPN: 26.0000 [IU] | PEN_INJECTOR | Freq: Every day | SUBCUTANEOUS | 1 refills | Status: DC

## 2019-10-20 NOTE — Addendum Note (Signed)
Addended by: Lourena Simmonds on: 10/20/2019 12:16 PM   Modules accepted: Orders

## 2019-10-20 NOTE — Chronic Care Management (AMB) (Signed)
Chronic Care Management   Follow Up Note   10/20/2019 Name: Danielle Park MRN: [id number] DOB: 1950/12/31  Referred by: Leone Haven, MD Reason for referral : Chronic Care Management (Medication Management)   Danielle Park is a 69 y.o. year old female who is a primary care patient of Caryl Bis, Angela Adam, MD. The CCM team was consulted for assistance with chronic disease management and care coordination needs.    Contacted patient for medication management review.   Review of patient status, including review of consultants reports, relevant laboratory and other test results, and collaboration with appropriate care team members and the patient's provider was performed as part of comprehensive patient evaluation and provision of chronic care management services.    SDOH (Social Determinants of Health) assessments performed: Yes See Care Plan activities for detailed interventions related to SDOH)  SDOH Interventions     Most Recent Value  SDOH Interventions  Financial Strain Interventions Other (Comment)  [continued support from patient assistance program]  Physical Activity Interventions Intervention Not Indicated       Outpatient Encounter Medications as of 10/20/2019  Medication Sig Note   amLODipine (NORVASC) 10 MG tablet TAKE 1 TABLET BY MOUTH EVERY DAY 10/20/2019: AM   aspirin 81 MG tablet Take 81 mg by mouth daily.    cholecalciferol (VITAMIN D3) 25 MCG (1000 UT) tablet Take 2,000 Units by mouth daily.    clopidogrel (PLAVIX) 75 MG tablet TAKE 1 TABLET (75 MG TOTAL) BY MOUTH DAILY.    Dulaglutide (TRULICITY) 3 HY/0.7PX SOPN Inject 3 mg into the skin once a week.    empagliflozin (JARDIANCE) 25 MG TABS tablet Take 25 mg by mouth daily before breakfast.    furosemide (LASIX) 20 MG tablet TAKE 1 TABLET BY MOUTH EVERY DAY    glipiZIDE (GLUCOTROL) 5 MG tablet Take 1 tablet (5 mg total) by mouth 2 (two) times daily before a meal.    hydrALAZINE (APRESOLINE) 25 MG  tablet TAKE 1 TABLET BY MOUTH THREE TIMES A DAY 07/24/2019: BID   Insulin Glargine (BASAGLAR KWIKPEN) 100 UNIT/ML SOPN Inject 30 Units into the skin daily. 03/31/2019: 30 units daily    levothyroxine (SYNTHROID) 75 MCG tablet TAKE ONE TABLET BY MOUTH ONCE DAILY BEFORE BREAKFAST    lisinopril (ZESTRIL) 40 MG tablet TAKE 1 TABLET BY MOUTH EVERY DAY 10/20/2019: QAM   metoprolol tartrate (LOPRESSOR) 25 MG tablet TAKE 1 TABLET BY MOUTH TWICE A DAY    NOVOFINE 32G X 6 MM MISC USE AS DIRECTED WITH LEVEMIR PEN. PLEASE RESCHEDULE MISSED APPT WITH PCP SOON.    rosuvastatin (CRESTOR) 10 MG tablet TAKE 1 TABLET BY MOUTH EVERY DAY    traZODone (DESYREL) 50 MG tablet TAKE 1/2 - 1 TABLETS (25-50 MG TOTAL) BY MOUTH AT BEDTIME AS NEEDED FOR SLEEP.    No facility-administered encounter medications on file as of 10/20/2019.     Objective:   Goals Addressed              This Visit's Progress     Patient Stated     "I want to get my diabetes better controlled" (pt-stated)        CARE PLAN ENTRY (see longtitudinal plan of care for additional care plan information)  Current Barriers:   Social, financial, or community needs:  o Patient denies financial concerns at this time. Denies depression, but notes that she has difficulty sleeping, and "really doesn't do much" during the day. See Insomnia below o Appt later today w/  nephrology. Notes she saw him last week for a UTI, but that she believes she wasn't focusing on remaining well hydrated. Symptoms resolved s/p course of ciprofloxacin  Diabetes, controlled; most recent A1c 6.1%  Most recent eGFR 40 mL/min  Current antihyperglycemic regimen: Trulicity 3 mg once weekly, Basaglar 30 units once daily; glipizide 5 mg with the biggest meal of her day (only eating 1 meal a day), Jardiance 25 mg QAM o Per notes from previous PCP, avoiding metformin use d/t congential single kidney o APPROVED for Trulicity and Engineer, agricultural assistance through [employer] through  03/26/20  o Patient denied needing assistance w/ Jardiance at this time  Denies episodes of hypoglycemia  Current meal patterns:  o Drinks: 1 cup of coffee, water o Keeps fruit on hand for snacks throughout the day o Supper: tomato sandwich; sometimes chicken  o Overnight snack when she can't sleep: tootsie roll, milk   Current exercise: o Walks every day on the treadmill; ~3x daily, 10 minutes at a time   Current glucose readings:   Fasting 2 hours after meal  1-Jul 91 178  2-Jul 76 185  3-Jul 75 123  4-Jul 74 180  5-Jul 80 152  6-Jul 85 209  7-Jul 92 172  8-Jul 88 161  9-Jul 95 90  10-Jul 103 203  11-Jul 70 190  12-Jul 83 191  13-Jul 95 188  14-Jul 93 187  15-Jul 104 200  16-Jul 107 226  17-Jul 85 141  18-Jul 78 176  19-Jul 76 185  20-Jul 82 165  21-Jul 83 149  22-Jul 92 205  23-Jul 92 195   87 176    Cardiovascular risk reduction: o Current hypertensive regimen: lisinopril 40 mg QAM, amlodipine 10 mg QAM, furosemide 20 mg QAM; hydralazine 25 mg BID, metoprolol tartrate 25 mg BID; - Home BP: 130s/60s o Current hyperlipidemia regimen: rosuvastatin 10 mg daily, LDL well controlled <70 on last check o Antiplatelet therapy (PVD, renal artery stenting in 2014): clopidogrel 75 mg daily, ASA 81 mg daily  - both agents recommended by vascular  Hypothyroidism: levothyroxine 75 mcg daily   Insomnia: started on trazodone 25-50 mg QPM PRN by PCP at last visit. Patient reports she had been taking 50 mg QPM for a few weeks, but felt little benefit, so is not taking regularly now. Denies caffeine use after noon, has stopped using electronics before bed. Notes that she "just can't turn my brain off". Hx melatonin w/ no benefit  Pharmacist Clinical Goal(s):   Over the next 90 days, patient with work with PharmD and primary care provider to address optimized medication management  Interventions:  Comprehensive medication review performed, medication list updated in  electronic medical record  Inter-disciplinary care team collaboration (see longitudinal plan of care)  Reviewed BG readings. To prevent hypoglycemia, decrease Basaglar to 26 units daily. Patient aware that if fastings trend to >130, she can increase Basaglar to 28 units daily.   Continue Jardiance 25 mg daily. Encouraged adequate hydration. Continue glipizide 5 mg w/ meal. Reviewed tx hypoglycemia  Extensive dietary discussion. Recommended finding other things to do when she cannot sleep, rather than snack, or, focus on low carb snacks. Discussed exercise, praised for performing >150 min weekly. Discussed increasing intensity/time during the day to see if this improves sleep. Warned to avoid intensive exercise close to bedtime. She verbalized understanding.   Reviewed goal BP. Continue current regimen  Reviewed goal LDL. Continue current regimen.   Patient Self Care Activities:   Patient  will check blood glucose BID, document, and provide at future appointments.   Patient will take medications as prescribed  Patient will contact provider with any episodes of hypoglycemia  Patient will report any questions or concerns to provider   Please see past updates related to this goal by clicking on the "Past Updates" button in the selected goal          Plan:  - Scheduled f/u call in ~ 10 weeks  [name], PharmD, [place], [place] Pharmacist [place] [phone]

## 2019-10-20 NOTE — Patient Instructions (Signed)
Danielle Park,   It was GREAT talking with you today! Keep up the great work!  To reduce your risk of episodes of low blood sugar, reduce Basaglar to 26 units daily. If, however, your fasting sugar readings start to be greater than 130, you can increase Basaglar to 28 units daily.   Keep taking Jardiance 25 mg daily (this medication is good for your kidneys) and glipizide 5 mg with your large meal.   Remember what we talked about regarding carbs, both with your large meal if your post meal sugar is >180, and carbs as overnight snacks.   Call me if you need anything prior to our October phone call!  [name], PharmD, [place], CPP Clinical Pharmacist [place] [phone]  Visit Information  Goals Addressed              This Visit's Progress     Patient Stated   .  "I want to get my diabetes better controlled" (pt-stated)        CARE PLAN ENTRY (see longtitudinal plan of care for additional care plan information)  Current Barriers:  . Social, financial, or community needs:  o Patient denies financial concerns at this time. Denies depression, but notes that she has difficulty sleeping, and "really doesn't do much" during the day. See Insomnia below o Appt later today w/ nephrology. Notes she saw him last week for a UTI, but that she believes she wasn't focusing on remaining well hydrated. Symptoms resolved s/p course of ciprofloxacin . Diabetes, controlled; most recent A1c 6.1% . Most recent eGFR 40 mL/min . Current antihyperglycemic regimen: Trulicity 3 mg once weekly, Basaglar 30 units once daily; glipizide 5 mg with the biggest meal of her day (only eating 1 meal a day), Jardiance 25 mg QAM o Per notes from previous PCP, avoiding metformin use d/t congential single kidney o APPROVED for Trulicity and Engineer, agricultural assistance through [employer] through 03/26/20  o Patient denied needing assistance w/ Jardiance at this  time . Denies episodes of hypoglycemia . Current meal patterns:  o Drinks: 1 cup of coffee, water o Keeps fruit on hand for snacks throughout the day o Supper: tomato sandwich; sometimes chicken  o Overnight snack when she can't sleep: tootsie roll, milk  . Current exercise: o Walks every day on the treadmill; ~3x daily, 10 minutes at a time  . Current glucose readings:   Fasting 2 hours after meal  1-Jul 91 178  2-Jul 76 185  3-Jul 75 123  4-Jul 74 180  5-Jul 80 152  6-Jul 85 209  7-Jul 92 172  8-Jul 88 161  9-Jul 95 90  10-Jul 103 203  11-Jul 70 190  12-Jul 83 191  13-Jul 95 188  14-Jul 93 187  15-Jul 104 200  16-Jul 107 226  17-Jul 85 141  18-Jul 78 176  19-Jul 76 185  20-Jul 82 165  21-Jul 83 149  22-Jul 92 205  23-Jul 92 195   87 176   . Cardiovascular risk reduction: o Current hypertensive regimen: lisinopril 40 mg QAM, amlodipine 10 mg QAM, furosemide 20 mg QAM; hydralazine 25 mg BID, metoprolol tartrate 25 mg BID; - Home BP: 130s/60s o Current hyperlipidemia regimen: rosuvastatin 10 mg daily, LDL well controlled <70 on last check o Antiplatelet therapy (PVD, renal artery stenting in 2014): clopidogrel 75 mg daily, ASA 81 mg daily  - both agents recommended by vascular . Hypothyroidism: levothyroxine 75 mcg daily  . Insomnia:  started on trazodone 25-50 mg QPM PRN by PCP at last visit. Patient reports she had been taking 50 mg QPM for a few weeks, but felt little benefit, so is not taking regularly now. Denies caffeine use after noon, has stopped using electronics before bed. Notes that she "just can't turn my brain off". Hx melatonin w/ no benefit  Pharmacist Clinical Goal(s):  [place] Over the next 90 days, patient with work with PharmD and primary care provider to address optimized medication management  Interventions: . Comprehensive medication review performed, medication list updated in electronic medical record . Inter-disciplinary care team collaboration (see  longitudinal plan of care) . Reviewed BG readings. To prevent hypoglycemia, decrease Basaglar to 26 units daily. Patient aware that if fastings trend to >130, she can increase Basaglar to 28 units daily.  . Continue Jardiance 25 mg daily. Encouraged adequate hydration. Continue glipizide 5 mg w/ meal. Reviewed tx hypoglycemia . Extensive dietary discussion. Recommended finding other things to do when she cannot sleep, rather than snack, or, focus on low carb snacks. Discussed exercise, praised for performing >150 min weekly. Discussed increasing intensity/time during the day to see if this improves sleep. Warned to avoid intensive exercise close to bedtime. She verbalized understanding.  . Reviewed goal BP. Continue current regimen . Reviewed goal LDL. Continue current regimen.   Patient Self Care Activities:  . Patient will check blood glucose BID, document, and provide at future appointments.  . Patient will take medications as prescribed . Patient will contact provider with any episodes of hypoglycemia . Patient will report any questions or concerns to provider   Please see past updates related to this goal by clicking on the "Past Updates" button in the selected goal         The patient verbalized understanding of instructions provided today and agreed to receive a mailed copy of patient instruction and/or educational materials.  Plan:  - Scheduled f/u call in ~ 10 weeks  [name], PharmD, [place], [place] Pharmacist [place] [phone]

## 2019-10-21 ENCOUNTER — Encounter (INDEPENDENT_AMBULATORY_CARE_PROVIDER_SITE_OTHER): Payer: PPO

## 2019-10-21 ENCOUNTER — Encounter (INDEPENDENT_AMBULATORY_CARE_PROVIDER_SITE_OTHER): Payer: Self-pay

## 2019-10-21 ENCOUNTER — Ambulatory Visit (INDEPENDENT_AMBULATORY_CARE_PROVIDER_SITE_OTHER): Payer: PPO

## 2019-10-21 ENCOUNTER — Other Ambulatory Visit: Payer: Self-pay | Admitting: Family Medicine

## 2019-10-21 ENCOUNTER — Ambulatory Visit (INDEPENDENT_AMBULATORY_CARE_PROVIDER_SITE_OTHER): Payer: PPO | Admitting: Vascular Surgery

## 2019-10-21 ENCOUNTER — Encounter (INDEPENDENT_AMBULATORY_CARE_PROVIDER_SITE_OTHER): Payer: Self-pay | Admitting: Vascular Surgery

## 2019-10-21 ENCOUNTER — Other Ambulatory Visit: Payer: Self-pay

## 2019-10-21 VITALS — BP 106/65 | HR 62 | Resp 18 | Ht 63.0 in | Wt 193.0 lb

## 2019-10-21 DIAGNOSIS — E1121 Type 2 diabetes mellitus with diabetic nephropathy: Secondary | ICD-10-CM | POA: Diagnosis not present

## 2019-10-21 DIAGNOSIS — I150 Renovascular hypertension: Secondary | ICD-10-CM | POA: Diagnosis not present

## 2019-10-21 DIAGNOSIS — I701 Atherosclerosis of renal artery: Secondary | ICD-10-CM

## 2019-10-21 DIAGNOSIS — N1831 Chronic kidney disease, stage 3a: Secondary | ICD-10-CM

## 2019-10-21 DIAGNOSIS — Z794 Long term (current) use of insulin: Secondary | ICD-10-CM | POA: Diagnosis not present

## 2019-10-21 DIAGNOSIS — I739 Peripheral vascular disease, unspecified: Secondary | ICD-10-CM

## 2019-10-21 DIAGNOSIS — N1832 Chronic kidney disease, stage 3b: Secondary | ICD-10-CM

## 2019-10-21 NOTE — Patient Instructions (Signed)
Renal Artery Stenosis Renal artery stenosis (RAS) is a narrowing of the artery that carries blood to the kidneys. It can affect one or both kidneys. The kidneys filter waste and extra fluid from the blood. Waste and fluid are then removed when a person passes urine. The kidneys also make an important chemical messenger (hormone) called renin. Renin helps regulate blood pressure. The first sign of RAS may be high blood pressure. Other symptoms can develop over time. What are the causes? A common cause of this condition is plaque buildup in your arteries (atherosclerosis). The plaques that cause this are made up of:  Fat.  Cholesterol.  Calcium.  Other substances. As these substances build up in your renal artery, the blood supply to your kidneys slows. The lack of blood and oxygen causes the signs and symptoms of RAS. A much less common cause of RAS is a disease called fibromuscular dysplasia. This disease causes abnormal cell growth that narrows the renal artery. It is not related to atherosclerosis. It occurs mostly in women who are 25-50 years old. It may be passed down through families.  What increases the risk? You are more likely to develop this condition if you:  Are a man who is at least 69 years old.  Are a woman who is at least 69 years old.  Have high blood pressure.  Have high cholesterol.  Are a smoker.  Abuse alcohol.  Have diabetes or prediabetes.  Are overweight or obese.  Have a family history of early heart disease. What are the signs or symptoms? RAS usually develops slowly. You may not have any signs or symptoms at first. Early signs may include:  Development of high blood pressure.  A sudden increase in existing high blood pressure.  No longer responding to medicine that used to control your blood pressure. Later signs and symptoms are due to kidney damage. They may include:  Feeling tired (fatigue).  Shortness of breath.  Swollen legs and  feet.  Dry skin.  Headaches.  Muscle cramps.  Loss of appetite.  Nausea or vomiting. How is this diagnosed? This condition may be diagnosed based on:  Your symptoms and medical history. Your health care provider may suspect RAS based on changes in your blood pressure and your risk factors.  A physical exam. During the exam, your health care provider will use a stethoscope to listen for a whooshing sound (bruit) that can occur where the renal artery is blocking blood flow.  Various tests. These may include: ? Blood and urine tests to check your kidney function. ? Imaging tests of your kidneys, such as:  A test that uses sound waves to create an image of your kidneys and the blood flow to your kidneys (ultrasound).  A test in which dye is injected into one of your blood vessels so images can be taken as the dye flows through your renal arteries (angiogram). This can be done using X-rays, a CT scan (computed tomography angiogram, CTA), or a type of MRI (magnetic resonance angiogram, MRA). How is this treated? Making lifestyle changes to reduce your risk factors is the first treatment option for early RAS. If the blood flow to one of your kidneys is cut by more than half, you may need medicine to:  Lower your blood pressure. This is the main medical treatment for RAS. You may need more than one type of medicine for this. The types that work best for people with RAS are: ? ACE inhibitors. ? Angiotensin receptor   blockers.  Reduce fluid in the body (diuretics).  Lower your cholesterol (statins). If medicine is not enough to control RAS, you may need surgery. This may involve:  Threading a tube with an inflatable balloon into the renal artery to force it to open (angioplasty).  Removing plaque from inside the artery (endarterectomy). Follow these instructions at home:  Lifestyle  Make any lifestyle changes recommended by your health care provider. This may include: ? Working with  a dietitian to maintain a heart-healthy diet. This type of diet is low in saturated fat, salt, and added sugar. ? Starting an exercise program as directed by your health care provider. ? Maintaining a healthy weight. ? Quitting smoking. ? Not abusing alcohol. General instructions  Take over-the-counter and prescription medicines only as told by your health care provider.  Keep all follow-up visits as told by your health care provider. This is important. Contact a health care provider if:  Your symptoms of RAS are not getting better.  Your symptoms are changing or getting worse. Get help right away if you have:  Very bad pain in your back or abdomen.  Blood in your urine. Summary  Renal artery stenosis (RAS) is a narrowing of the artery that carries blood to the kidneys. It can affect one or both kidneys.  RAS usually develops slowly. You may not have any signs or symptoms at first, but high blood pressure that is difficult to control is a key symptom.  Making lifestyle changes to reduce your risk factors is the first treatment option for early RAS. If the blood flow to one of your kidneys is cut by more than half, you may need medicines to help manage your cholesterol and blood pressure. This information is not intended to replace advice given to you by your health care provider. Make sure you discuss any questions you have with your health care provider. Document Revised: 04/09/2017 Document Reviewed: 04/09/2017 Elsevier Patient Education  2020 [employer]vier Inc.  

## 2019-10-21 NOTE — Assessment & Plan Note (Signed)
Duplex today shows a patent right renal artery stent with good flow and a normal right kidney size.  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  Given the solitary nature of her kidney with significant chronic kidney disease and previous severe hypertension, this will continue to be monitored closely on 58-month intervals.  No change in medical regimen.

## 2019-10-21 NOTE — Progress Notes (Signed)
MRN : [id number]  Danielle Park is a 69 y.o. ([birth year]) female who presents with chief complaint of  Chief Complaint  Patient presents with  . Follow-up  .  History of Present Illness: Patient returns today in follow up of multiple vascular issues.  She is about 5 years status post lower extremity intervention for disabling claudication symptoms.  Her legs are currently doing well without any significant pain with activity, ulceration, or infection.  Her ABIs today are 0.98 on the right and 0.89 on the left with multiphasic waveforms distally and normal digital pressures and waveforms. 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  Her creatinine clearance has gone down to the low 30s on her most recent visit with her nephrologist.  Her blood pressure is better after intervention. Duplex today shows a patent right renal artery stent with good flow and a normal right kidney size.  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  Current Outpatient Medications  Medication Sig Dispense Refill  . amLODipine (NORVASC) 10 MG tablet TAKE 1 TABLET BY MOUTH EVERY DAY 90 tablet 1  . aspirin 81 MG tablet Take 81 mg by mouth daily.    . cholecalciferol (VITAMIN D3) 25 MCG (1000 UT) tablet Take 2,000 Units by mouth daily.    . clopidogrel (PLAVIX) 75 MG tablet TAKE 1 TABLET (75 MG TOTAL) BY MOUTH DAILY. 90 tablet 2  . Dulaglutide (TRULICITY) 3 MG/0.5ML SOPN Inject 3 mg into the skin once a week. 12 pen 3  . empagliflozin (JARDIANCE) 25 MG TABS tablet Take 25 mg by mouth daily before breakfast. 90 tablet 3  . furosemide (LASIX) 20 MG tablet TAKE 1 TABLET BY MOUTH EVERY DAY 90 tablet 1  . glipiZIDE (GLUCOTROL) 5 MG tablet Take 1 tablet (5 mg total) by mouth 2 (two) times daily before a meal. 180 tablet 1  . hydrALAZINE (APRESOLINE) 25 MG tablet TAKE 1 TABLET BY MOUTH THREE TIMES A DAY 270  tablet 1  . Insulin Glargine (BASAGLAR KWIKPEN) 100 UNIT/ML Inject 0.26 mLs (26 Units total) into the skin daily. 15 mL 1  . levothyroxine (SYNTHROID) 75 MCG tablet TAKE ONE TABLET BY MOUTH ONCE DAILY BEFORE BREAKFAST 90 tablet 1  . lisinopril (ZESTRIL) 40 MG tablet TAKE 1 TABLET BY MOUTH EVERY DAY 90 tablet 1  . metoprolol tartrate (LOPRESSOR) 25 MG tablet TAKE 1 TABLET BY MOUTH TWICE A DAY 180 tablet 1  . NOVOFINE 32G X 6 MM MISC USE AS DIRECTED WITH LEVEMIR PEN. PLEASE RESCHEDULE MISSED APPT WITH PCP SOON. 100 each 3  . rosuvastatin (CRESTOR) 10 MG tablet TAKE 1 TABLET BY MOUTH EVERY DAY 90 tablet 1  . traZODone (DESYREL) 50 MG tablet TAKE 1/2 - 1 TABLETS (25-50 MG TOTAL) BY MOUTH AT BEDTIME AS NEEDED FOR SLEEP. 90 tablet 2   No current facility-administered medications for this visit.    Past Medical History:  Diagnosis Date  . Chicken pox   . 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 Congenital single kidney   . Diabetes mellitus   . Hypertension   . UTI (urinary tract infection)     Past Surgical History:  Procedure Laterality Date  . CESAREAN SECTION    . PERIPHERAL VASCULAR CATHETERIZATION N/A 01/11/2015   Procedure: Abdominal Aortogram w/Lower Extremity;  Surgeon: Annice Needy, MD;  Location: [facility] INVASIVE  CV LAB;  Service: Cardiovascular;  Laterality: N/A;  . PERIPHERAL VASCULAR CATHETERIZATION  01/11/2015   Procedure: Lower Extremity Intervention;  Surgeon: Annice Needy, MD;  Location: [facility] INVASIVE CV LAB;  Service: Cardiovascular;;  . RENAL ARTERY STENT  2014   Dr. Wyn Quaker at [facility]  . VAGINAL DELIVERY       Social History   Tobacco Use  . Smoking status: Never Smoker  . Smokeless tobacco: Never Used  Substance Use Topics  . Alcohol use: No    Alcohol/week: 0.0 standard drinks  . Drug use: No  Married, husband is a patient of ours as well.    Family History  Problem Relation Age of Onset  . Hypertension Mother   .  Diabetes Mother   . Heart disease Mother        s/p CABG x 2  . Hypertension Father   . Cancer Father        lung   . Diabetes Sister   . Heart disease Sister   . Heart disease Sister   . Cancer - Lung Brother   . Cancer Sister        lung  . Heart disease Other   . Diabetes Other     No Known Allergies  REVIEW OF SYSTEMS(Negative unless checked)  Constitutional: [] ???Weight loss[] ???Fever[] ???Chills Cardiac:[] ???Chest pain[] ???Chest pressure[] ???Palpitations [] ???Shortness of breath when laying flat [] ???Shortness of breath at rest [] ???Shortness of breath with exertion. Vascular: [x] ???Pain in legs with walking[] ???Pain in legsat rest[] ???Pain in legs when laying flat [x] ???Claudication [] ???Pain in feet when walking [] ???Pain in feet at rest [] ???Pain in feet when laying flat [] ???History of DVT [] ???Phlebitis [] ???Swelling in legs [] ???Varicose veins [] ???Non-healing ulcers Pulmonary: [] ???Uses home oxygen [] ???Productive cough[] ???Hemoptysis [] ???Wheeze [] ???COPD [] ???Asthma Neurologic: [] ???Dizziness [] ???Blackouts [] ???Seizures [] ???History of stroke [] ???History of TIA[] ???Aphasia [] ???Temporary blindness[] ???Dysphagia [] ???Weaknessor numbness in arms [] ???Weakness or numbnessin legs Musculoskeletal: [x] ???Arthritis [] ???Joint swelling [] ???Joint pain [] ???Low back pain Hematologic:[] ???Easy bruising[] ???Easy bleeding [] ???Hypercoagulable state [] ???Anemic  Gastrointestinal:[] ???Blood in stool[] ???Vomiting blood[] ???Gastroesophageal reflux/heartburn[] ???Abdominal pain Genitourinary: [x] ???Chronic kidney disease [] ???Difficulturination [] ???Frequenturination [] ???Burning with urination[x] ???Hematuria Skin: [] ???Rashes [] ???Ulcers [] ???Wounds Psychological: [] ???History of anxiety[] ???History of major depression.  Physical Examination  BP 106/65 (BP  Location: Right Arm)   Pulse 62   Resp 18   Ht 5\' 3"  (1.6 m)   Wt 193 lb (87.5 kg)   BMI 34.19 kg/m  Gen:  WD/WN, NAD Head: [place]/AT, No temporalis wasting. Ear/Nose/Throat: Hearing grossly intact, nares w/o erythema or drainage Eyes: Conjunctiva clear. Sclera non-icteric Neck: Supple.  Trachea midline Pulmonary:  Good air movement, no use of accessory muscles.  Cardiac: RRR, no JVD Vascular:  Vessel Right Left  Radial Palpable Palpable                          PT Palpable Palpable  DP Palpable Palpable    Musculoskeletal: M/S 5/5 throughout.  No deformity or atrophy. No edema. Neurologic: Sensation grossly intact in extremities.  Symmetrical.  Speech is fluent.  Psychiatric: Judgment intact, Mood & affect appropriate for pt's clinical situation. Dermatologic: No rashes or ulcers noted.  No cellulitis or open wounds.       Labs Recent Results (from the past 2160 hour(s))  Basic Metabolic Panel (BMET)     Status: Abnormal   Collection Time: 08/22/19  9:00 AM  Result Value Ref Range   Sodium 136 135 - 145 mEq/L   Potassium 4.7 3.5 - 5.1 mEq/L   Chloride 103 96 - 112 mEq/L   CO2  26 19 - 32 mEq/L   Glucose, Bld 153 (H) 70 - 99 mg/dL   BUN 23 6 - 23 mg/dL   Creatinine, Ser 6.16 (H) 0.40 - 1.20 mg/dL   GFR 07.37 (L) >10.62 mL/min   Calcium 9.4 8.4 - 10.5 mg/dL  HgB I9S     Status: None   Collection Time: 08/22/19  9:00 AM  Result Value Ref Range   Hgb A1c MFr Bld 6.1 4.6 - 6.5 %    Comment: Glycemic Control Guidelines for People with Diabetes:Non Diabetic:  <6%Goal of Therapy: <7%Additional Action Suggested:  >8%     Radiology No results found.  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 Also, involved in wound healing. On appropriate medications.  PVD (peripheral vascular disease) (HCC) Her  ABIs today are 0.98 on the right and 0.89 on the left with multiphasic waveforms distally and normal digital pressures and waveforms.  Doing well with good symptom relief 5 years after intervention.  Continue to monitor this on an annual basis.  No change in medical regimen.  CKD (chronic kidney disease), stage III (HCC) Recently saw her nephrologist.  Last creatinine clearance was approximately 33.  Avoid dehydration.  Continue to monitor  Renal artery stenosis (HCC) Duplex today shows a patent right renal artery stent with good flow and a normal right kidney size.  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  Given the solitary nature of her kidney with significant chronic kidney disease and previous severe hypertension, this will continue to be monitored closely on 44-month intervals.  No change in medical regimen.    Festus Barren, MD  10/21/2019 10:06 AM    This note was created with Dragon medical transcription system.  Any errors from dictation are purely unintentional

## 2019-10-21 NOTE — Assessment & Plan Note (Signed)
Recently saw her nephrologist.  Last creatinine clearance was approximately 33.  Avoid dehydration.  Continue to monitor

## 2019-10-21 NOTE — Assessment & Plan Note (Signed)
Her ABIs today are 0.98 on the right and 0.89 on the left with multiphasic waveforms distally and normal digital pressures and waveforms.  Doing well with good symptom relief 5 years after intervention.  Continue to monitor this on an annual basis.  No change in medical regimen.

## 2019-10-24 ENCOUNTER — Other Ambulatory Visit: Payer: Self-pay | Admitting: Family Medicine

## 2019-10-29 ENCOUNTER — Other Ambulatory Visit: Payer: Self-pay | Admitting: Family Medicine

## 2019-10-29 DIAGNOSIS — Z794 Long term (current) use of insulin: Secondary | ICD-10-CM

## 2019-11-04 ENCOUNTER — Encounter: Payer: Self-pay | Admitting: Family Medicine

## 2019-11-04 ENCOUNTER — Ambulatory Visit (INDEPENDENT_AMBULATORY_CARE_PROVIDER_SITE_OTHER): Payer: PPO | Admitting: Family Medicine

## 2019-11-04 ENCOUNTER — Other Ambulatory Visit: Payer: Self-pay

## 2019-11-04 DIAGNOSIS — M545 Low back pain, unspecified: Secondary | ICD-10-CM

## 2019-11-04 MED ORDER — CYCLOBENZAPRINE HCL 5 MG PO TABS
5.0000 mg | ORAL_TABLET | Freq: Three times a day (TID) | ORAL | 0 refills | Status: DC | PRN
Start: 2019-11-04 — End: 2020-03-29

## 2019-11-04 NOTE — Patient Instructions (Signed)
Nice to see you. Please try the Flexeril.  This may make you drowsy so please be careful when taking it.  If it makes you excessively drowsy please discontinue it.  Do not drive while taking this medication. If you are not improving the next 3 to 4 weeks please let us know.

## 2019-11-04 NOTE — Assessment & Plan Note (Signed)
Suspect muscular strain.  Discussed remaining active though not overdoing it.  Discussed potential medication options including a muscle relaxer and/or tramadol.  She notes tramadol does not do anything for her and the muscle relaxer used for a prior back strain (baclofen) was not helpful.  We will trial a low-dose of Flexeril.  Discussed monitoring for drowsiness with this and if drowsiness occurs she needs to discontinue the medicine and let us know.  Advised not to drive while taking this medication.  If not improving she will let us know.  Discussed it could be 4 to 6 weeks before complete improvement.

## 2019-11-04 NOTE — Progress Notes (Signed)
Marikay Alar, MD Phone: [phone]  Danielle Park is a 69 y.o. female who presents today for same-day visit.  Low back pain: Notes this started 2 weeks ago.  No specific injury though she has been doing exercise bands with her legs as well as a treadmill and doing some twisting while carrying cases of water.  Notes the pain and spasm started after that.  Feels as though has a spasm in her muscles in her right low back.  No radiation.  No numbness.  No weakness.  No incontinence.  She has tried some Tylenol.  Notes it typically only hurts when she moves.  She notes it is somewhat better today as she is able to stand up without terrible difficulty.  Social History   Tobacco Use  Smoking Status Never Smoker  Smokeless Tobacco Never Used     ROS see history of present illness  Objective  Physical Exam Vitals:   11/04/19 1126  BP: (!) 150/70  Pulse: 74  Temp: 99.1 F (37.3 C)  SpO2: 97%    BP Readings from Last 3 Encounters:  11/04/19 (!) 150/70  10/21/19 106/65  08/29/19 (!) 177/75   Wt Readings from Last 3 Encounters:  11/04/19 192 lb 12.8 oz (87.5 kg)  10/21/19 193 lb (87.5 kg)  08/15/19 195 lb 3.2 oz (88.5 kg)    Physical Exam Constitutional:      General: She is not in acute distress.    Appearance: She is not diaphoretic.  Cardiovascular:     Rate and Rhythm: Normal rate and regular rhythm.     Heart sounds: Normal heart sounds.  Pulmonary:     Effort: Pulmonary effort is normal.     Breath sounds: Normal breath sounds.  Musculoskeletal:     Comments: No midline spine tenderness, no midline spine step-off, there is right lumbar muscular soreness to palpation though no significant tenderness  Skin:    General: Skin is warm and dry.  Neurological:     Mental Status: She is alert.     Comments: 5/5 strength bilateral quads, hamstrings, plantar flexion, and dorsiflexion, sensation light touch intact bilateral lower extremities      Assessment/Plan:  Please see individual problem list.  Low back pain Suspect muscular strain.  Discussed remaining active though not overdoing it.  Discussed potential medication options including a muscle relaxer and/or tramadol.  She notes tramadol does not do anything for her and the muscle relaxer used for a prior back strain (baclofen) was not helpful.  We will trial a low-dose of Flexeril.  Discussed monitoring for drowsiness with this and if drowsiness occurs she needs to discontinue the medicine and let us know.  Advised not to drive while taking this medication.  If not improving she will let us know.  Discussed it could be 4 to 6 weeks before complete improvement.   No orders of the defined types were placed in this encounter.   Meds ordered this encounter  Medications  . cyclobenzaprine (FLEXERIL) 5 MG tablet    Sig: Take 1 tablet (5 mg total) by mouth 3 (three) times daily as needed for muscle spasms.    Dispense:  20 tablet    Refill:  0    This visit occurred during the SARS-CoV-2 public health emergency.  Safety protocols were in place, including screening questions prior to the visit, additional usage of staff PPE, and extensive cleaning of exam room while observing appropriate contact time as indicated for disinfecting solutions.  Tommi Rumps, MD [place]

## 2019-12-03 ENCOUNTER — Other Ambulatory Visit: Payer: Self-pay | Admitting: Family Medicine

## 2019-12-12 ENCOUNTER — Ambulatory Visit (INDEPENDENT_AMBULATORY_CARE_PROVIDER_SITE_OTHER): Payer: PPO | Admitting: Pharmacist

## 2019-12-12 ENCOUNTER — Other Ambulatory Visit: Payer: Self-pay | Admitting: Family Medicine

## 2019-12-12 DIAGNOSIS — N1831 Chronic kidney disease, stage 3a: Secondary | ICD-10-CM

## 2019-12-12 DIAGNOSIS — Z794 Long term (current) use of insulin: Secondary | ICD-10-CM | POA: Diagnosis not present

## 2019-12-12 DIAGNOSIS — N1832 Chronic kidney disease, stage 3b: Secondary | ICD-10-CM

## 2019-12-12 DIAGNOSIS — E1122 Type 2 diabetes mellitus with diabetic chronic kidney disease: Secondary | ICD-10-CM

## 2019-12-12 DIAGNOSIS — E1121 Type 2 diabetes mellitus with diabetic nephropathy: Secondary | ICD-10-CM

## 2019-12-12 NOTE — Patient Instructions (Signed)
Visit Information  Goals Addressed              This Visit's Progress     Patient Stated   .  "I want to get my diabetes better controlled" (pt-stated)        CARE PLAN ENTRY (see longtitudinal plan of care for additional care plan information)  Current Barriers:  . Social, financial, or community needs:  o Calls today to report that she is in the [employer] Coverage Gap and cannot afford Jardiance anymore. Has been taking old supply of Jardiance 10 mg daily tablets, but will run out next week o Reports that she is struggling with the recent death of her brother.  . Diabetes, controlled; most recent A1c 6.1% . Most recent eGFR 40 mL/min . Current antihyperglycemic regimen: Trulicity 3 mg once weekly, Basaglar 26 units once daily; glipizide 5 mg with the biggest meal of her day (only eating 1 meal a day), Jardiance 25 mg QAM- self decreased to 10 mg daily  o Per notes from previous PCP, avoiding metformin use d/t congential single kidney o APPROVED for Trulicity and Engineer, agricultural assistance through [employer] through 03/26/20  . Cardiovascular risk reduction: o Current hypertensive regimen: lisinopril 40 mg QAM, amlodipine 10 mg QAM, furosemide 20 mg QAM; hydralazine 25 mg BID, metoprolol tartrate 25 mg BID o Current hyperlipidemia regimen: rosuvastatin 10 mg daily, LDL well controlled <70 on last check o Antiplatelet therapy (PVD, renal artery stenting in 2014): clopidogrel 75 mg daily, ASA 81 mg daily  - both agents recommended by vascular . Hypothyroidism: levothyroxine 75 mcg daily  . Insomnia: started on trazodone 25-50 mg QPM PRN by PCP at last visit.   Pharmacist Clinical Goal(s):  Marland Kitchen Over the next 90 days, patient with work with PharmD and primary care provider to address optimized medication management  Interventions: . Comprehensive medication review performed, medication list updated in electronic medical record . Inter-disciplinary care team collaboration (see longitudinal  plan of care) . Appears patient will qualify for assistance for Jardiance through [employer]. Will collaborate w/ CPhT to mail patient portion of the application. Will collaborate w/ PCP for provider signature. Once all parts received, will collaborate w/ CPhT for submission and f/u.  Marland Kitchen Offered LCSW referral for therapy support. Patient declines at this time  Patient Self Care Activities:  . Patient will check blood glucose BID, document, and provide at future appointments.  . Patient will take medications as prescribed . Patient will contact provider with any episodes of hypoglycemia . Patient will report any questions or concerns to provider   Please see past updates related to this goal by clicking on the "Past Updates" button in the selected goal         The patient verbalized understanding of instructions provided today and declined a print copy of patient instruction materials.   Plan:  - F/u call in ~ 5 weeks as previously scheduled  [name], PharmD, [place], [place] Pharmacist [place] [phone]

## 2019-12-12 NOTE — Chronic Care Management (AMB) (Signed)
Chronic Care Management   Follow Up Note   12/12/2019 Name: RAELLE CHAMBERS MRN: [id number] DOB: [birth year]  Referred by: Leone Haven, MD Reason for referral : Chronic Care Management (Medication Management)   PARTHENA FERGESON is a 69 y.o. year old female who is a primary care patient of Caryl Bis, Angela Adam, MD. The CCM team was consulted for assistance with chronic disease management and care coordination needs.    Contacted patient for medication management review.  Review of patient status, including review of consultants reports, relevant laboratory and other test results, and collaboration with appropriate care team members and the patient's provider was performed as part of comprehensive patient evaluation and provision of chronic care management services.    SDOH (Social Determinants of Health) assessments performed: Yes See Care Plan activities for detailed interventions related to SDOH)  SDOH Interventions     Most Recent Value  SDOH Interventions  Financial Strain Interventions Other (Comment)  [manufacturer assistance]       Outpatient Encounter Medications as of 12/12/2019  Medication Sig Note   Dulaglutide (TRULICITY) 3 GD/9.2EQ SOPN Inject 3 mg into the skin once a week.    empagliflozin (JARDIANCE) 25 MG TABS tablet Take 25 mg by mouth daily before breakfast. 12/12/2019: Taking 10 mg    amLODipine (NORVASC) 10 MG tablet TAKE 1 TABLET BY MOUTH EVERY DAY    aspirin 81 MG tablet Take 81 mg by mouth daily.    cholecalciferol (VITAMIN D3) 25 MCG (1000 UT) tablet Take 2,000 Units by mouth daily.    clopidogrel (PLAVIX) 75 MG tablet TAKE 1 TABLET (75 MG TOTAL) BY MOUTH DAILY.    cyclobenzaprine (FLEXERIL) 5 MG tablet Take 1 tablet (5 mg total) by mouth 3 (three) times daily as needed for muscle spasms.    furosemide (LASIX) 20 MG tablet TAKE 1 TABLET BY MOUTH EVERY DAY    glipiZIDE (GLUCOTROL) 5 MG tablet TAKE 1 TABLET (5 MG TOTAL) BY MOUTH 2 (TWO) TIMES DAILY  BEFORE A MEAL.    hydrALAZINE (APRESOLINE) 25 MG tablet TAKE 1 TABLET BY MOUTH THREE TIMES A DAY    Insulin Glargine (BASAGLAR KWIKPEN) 100 UNIT/ML Inject 0.26 mLs (26 Units total) into the skin daily.    levothyroxine (SYNTHROID) 75 MCG tablet TAKE ONE TABLET BY MOUTH ONCE DAILY BEFORE BREAKFAST    lisinopril (ZESTRIL) 40 MG tablet TAKE 1 TABLET BY MOUTH EVERY DAY 10/20/2019: QAM   metoprolol tartrate (LOPRESSOR) 25 MG tablet TAKE 1 TABLET BY MOUTH TWICE A DAY    NOVOFINE 32G X 6 MM MISC USE AS DIRECTED WITH LEVEMIR PEN. PLEASE RESCHEDULE MISSED APPT WITH PCP SOON.    rosuvastatin (CRESTOR) 10 MG tablet TAKE 1 TABLET BY MOUTH EVERY DAY    traZODone (DESYREL) 50 MG tablet TAKE 1/2 - 1 TABLETS (25-50 MG TOTAL) BY MOUTH AT BEDTIME AS NEEDED FOR SLEEP.    No facility-administered encounter medications on file as of 12/12/2019.     Objective:   Goals Addressed              This Visit's Progress     Patient Stated     "I want to get my diabetes better controlled" (pt-stated)        CARE PLAN ENTRY (see longtitudinal plan of care for additional care plan information)  Current Barriers:   Social, financial, or community needs:  o Calls today to report that she is in the [employer] Coverage Gap and cannot afford Jardiance anymore. Has  taking old supply of Jardiance 10 mg daily tablets, but will run out next week °o Reports that she is struggling with the recent death of her brother.  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 °o APPROVED for Trulicity and Basaglar assistance through [name] through 03/26/20  °• Cardiovascular risk  reduction: °o Current hypertensive regimen: lisinopril 40 mg QAM, amlodipine 10 mg QAM, furosemide 20 mg QAM; hydralazine 25 mg BID, metoprolol tartrate 25 mg BID °o Current hyperlipidemia regimen: rosuvastatin 10 mg daily, LDL well controlled <70 on last check °o Antiplatelet therapy (PVD, renal artery stenting in 2014): clopidogrel 75 mg daily, ASA 81 mg daily  - both agents recommended by vascular °• Hypothyroidism: levothyroxine 75 mcg daily  °• Insomnia: started on trazodone 25-50 mg QPM PRN by PCP at last visit.  ° °Pharmacist Clinical Goal(s):  °• Over the next 90 days, patient with work with PharmD and primary care provider to address optimized medication management ° °Interventions: °• Comprehensive medication review performed, medication list updated in electronic medical record °• Inter-disciplinary care team collaboration (see longitudinal plan of care) °• Appears patient will qualify for assistance for Jardiance through [employer]. Will collaborate w/ CPhT to mail patient portion of the application. Will collaborate w/ PCP for provider signature. Once all parts received, will collaborate w/ CPhT for submission and f/u.  °• Offered LCSW referral for therapy support. Patient declines at this time ° °Patient Self Care Activities:  °• Patient will check blood glucose BID, document, and provide at future appointments.  °• Patient will take medications as prescribed °• Patient will contact provider with any episodes of hypoglycemia °• Patient will report any questions or concerns to provider  ° °Please see past updates related to this goal by clicking on the "Past Updates" button in the selected goal  ° °  °  °  ° °Plan:  °- F/u call in ~ 5 weeks as previously scheduled ° °Catie Travis, PharmD, BCACP, CPP °Clinical Pharmacist °[facility] °[phone] ° ° °

## 2019-12-16 ENCOUNTER — Telehealth: Payer: Self-pay | Admitting: Family Medicine

## 2019-12-16 ENCOUNTER — Encounter: Payer: Self-pay | Admitting: Family Medicine

## 2019-12-16 ENCOUNTER — Telehealth (INDEPENDENT_AMBULATORY_CARE_PROVIDER_SITE_OTHER): Payer: PPO | Admitting: Family Medicine

## 2019-12-16 ENCOUNTER — Other Ambulatory Visit: Payer: Self-pay

## 2019-12-16 DIAGNOSIS — F32A Depression, unspecified: Secondary | ICD-10-CM

## 2019-12-16 DIAGNOSIS — N1831 Chronic kidney disease, stage 3a: Secondary | ICD-10-CM | POA: Diagnosis not present

## 2019-12-16 DIAGNOSIS — F329 Major depressive disorder, single episode, unspecified: Secondary | ICD-10-CM | POA: Diagnosis not present

## 2019-12-16 DIAGNOSIS — G4700 Insomnia, unspecified: Secondary | ICD-10-CM | POA: Diagnosis not present

## 2019-12-16 DIAGNOSIS — E039 Hypothyroidism, unspecified: Secondary | ICD-10-CM

## 2019-12-16 DIAGNOSIS — I150 Renovascular hypertension: Secondary | ICD-10-CM

## 2019-12-16 DIAGNOSIS — Z794 Long term (current) use of insulin: Secondary | ICD-10-CM

## 2019-12-16 DIAGNOSIS — F419 Anxiety disorder, unspecified: Secondary | ICD-10-CM

## 2019-12-16 DIAGNOSIS — E1121 Type 2 diabetes mellitus with diabetic nephropathy: Secondary | ICD-10-CM

## 2019-12-16 DIAGNOSIS — E1122 Type 2 diabetes mellitus with diabetic chronic kidney disease: Secondary | ICD-10-CM

## 2019-12-16 MED ORDER — MIRTAZAPINE 7.5 MG PO TABS
7.5000 mg | ORAL_TABLET | Freq: Every day | ORAL | 1 refills | Status: DC
Start: 2019-12-16 — End: 2020-01-01

## 2019-12-16 NOTE — Assessment & Plan Note (Signed)
Undetermined control though patient reports its been well controlled previously.  She will continue amlodipine 10 mg once daily, hydralazine 25 mg 3 times daily, lisinopril 40 mg once daily, and metoprolol 25 mg twice daily.

## 2019-12-16 NOTE — Telephone Encounter (Signed)
Medication Samples have been provided to the patient.  Drug name: Jardiance       Strength: 25 mg        Qty: 4 boxes (28 tabs)  LOT: [id number]  Exp.Date: 10/2021

## 2019-12-16 NOTE — Assessment & Plan Note (Signed)
Continue Synthroid.  Check TSH. 

## 2019-12-16 NOTE — Telephone Encounter (Signed)
Unable to lvm to schedule one week lab and 2 month follow up with PCP-mb is full on cell as well

## 2019-12-16 NOTE — Assessment & Plan Note (Signed)
>>  ASSESSMENT AND PLAN FOR CONTROLLED DIABETES MELLITUS TYPE 2 WITH COMPLICATIONS (HCC) WRITTEN ON 12/16/2019  9:54 AM BY SONNENBERG, ERIC G, MD  Seems to be well controlled.  We will have her in for an A1c.  She will continue Trulicity  3 mg once daily, Jardiance  25 mg once daily, glipizide  1 tablet before big meal, and Basaglar  26 units once daily.  I encouraged her to see the eye doctor for her yearly exam.

## 2019-12-16 NOTE — Assessment & Plan Note (Signed)
Reports she would be willing to start on medication for this.  I suspect this is contributing to her sleeping difficulty.  We will start on Remeron.  Discussed risk of increased appetite and if that occurs she should let us know.  Discussed risk of drowsiness.  If it is excessively drowsy the next day she is to let us know.

## 2019-12-16 NOTE — Assessment & Plan Note (Signed)
Seems to be well controlled.  We will have her in for an A1c.  She will continue Trulicity 3 mg once daily, Jardiance 25 mg once daily, glipizide 1 tablet before big meal, and Basaglar 26 units once daily.  I encouraged her to see the eye doctor for her yearly exam.

## 2019-12-16 NOTE — Addendum Note (Signed)
Addended by: Birdie Sons, Kayron Kalmar G on: 12/16/2019 10:01 AM   Modules accepted: Orders

## 2019-12-16 NOTE — Progress Notes (Signed)
Virtual Visit via video Note  This visit type was conducted due to national recommendations for restrictions regarding the COVID-19 pandemic (e.g. social distancing).  This format is felt to be most appropriate for this patient at this time.  All issues noted in this document were discussed and addressed.  No physical exam was performed (except for noted visual exam findings with Video Visits).   I connected with Danielle Park today at  9:30 AM EDT by a video enabled telemedicine application and verified that I am speaking with the correct person using two identifiers. Location patient: home Location provider: work  Persons participating in the virtual visit: patient, provider  I discussed the limitations, risks, security and privacy concerns of performing an evaluation and management service by telephone and the availability of in person appointments. I also discussed with the patient that there may be a patient responsible charge related to this service. The patient expressed understanding and agreed to proceed.  Reason for visit: f/u.  HPI: HYPERTENSION  Disease Monitoring  Home BP Monitoring not checking consistently though notes it had been well controlled previously chest pain-no    dyspnea-no Medications  Compliance-taking amlodipine 10 mg once daily, hydralazine 25 mg 3 times daily, lisinopril 40 mg once daily, metoprolol 25 mg twice daily.   Edema-no change to chronic swelling  DIABETES Disease Monitoring: Blood Sugar ranges-70s-80s fasting, 103-168 2-hour postprandial  polyuria/phagia/dipsia-no      Optho-due Medications: Compliance-Trulicity 3 mg once weekly, Jardiance 25 mg once daily, lipid Zide 1 tablet before big meal, and Basaglar 26 units once daily hypoglycemic symptoms-no  HYPOTHYROIDISM Disease Monitoring Skin Changes: No Heat/Cold intolerance: No  Medication Monitoring Compliance: Taking Synthroid Last TSH:   Lab Results  Component Value Date   TSH 3.80  04/18/2016   Anxiety/depression/difficulty sleeping: Patient notes this been going on for a while.  Notes its relatively stable.  Her brother passed away recently.  She does have lots of support with her 5 sisters.  He has not been able to sleep much at night.  Trazodone was helpful initially though not as helpful now.  No SI.   ROS: See pertinent positives and negatives per HPI.  Past Medical History:  Diagnosis Date  . Chicken pox   . 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 Congenital single kidney   . Diabetes mellitus   . Hypertension   . UTI (urinary tract infection)     Past Surgical History:  Procedure Laterality Date  . CESAREAN SECTION    . PERIPHERAL VASCULAR CATHETERIZATION N/A 01/11/2015   Procedure: Abdominal Aortogram w/Lower Extremity;  Surgeon: Annice Needy, MD;  Location: [facility] INVASIVE CV LAB;  Service: Cardiovascular;  Laterality: N/A;  . PERIPHERAL VASCULAR CATHETERIZATION  01/11/2015   Procedure: Lower Extremity Intervention;  Surgeon: Annice Needy, MD;  Location: [facility] INVASIVE CV LAB;  Service: Cardiovascular;;  . RENAL ARTERY STENT  2014   Dr. Wyn Quaker at [facility]  . VAGINAL DELIVERY      Family History  Problem Relation Age of Onset  . Hypertension Mother   . Diabetes Mother   . Heart disease Mother        s/p CABG x 2  . Hypertension Father   . Cancer Father        lung   . Diabetes Sister   . Heart disease Sister   . Heart disease Sister   . Cancer - Lung Brother   .  Cancer Sister        lung  . Heart disease Other   . Diabetes Other     SOCIAL HX: Non-smoker   Current Outpatient Medications:  .  amLODipine (NORVASC) 10 MG tablet, TAKE 1 TABLET BY MOUTH EVERY DAY, Disp: 90 tablet, Rfl: 1 .  aspirin 81 MG tablet, Take 81 mg by mouth daily., Disp: , Rfl:  .  cholecalciferol (VITAMIN D3) 25 MCG (1000 UT) tablet, Take 2,000 Units by mouth daily., Disp: , Rfl:  .  clopidogrel (PLAVIX) 75 MG tablet, TAKE 1  TABLET (75 MG TOTAL) BY MOUTH DAILY., Disp: 90 tablet, Rfl: 2 .  cyclobenzaprine (FLEXERIL) 5 MG tablet, Take 1 tablet (5 mg total) by mouth 3 (three) times daily as needed for muscle spasms., Disp: 20 tablet, Rfl: 0 .  Dulaglutide (TRULICITY) 3 MG/0.5ML SOPN, Inject 3 mg into the skin once a week., Disp: 12 pen, Rfl: 3 .  empagliflozin (JARDIANCE) 25 MG TABS tablet, Take 25 mg by mouth daily before breakfast., Disp: 90 tablet, Rfl: 3 .  furosemide (LASIX) 20 MG tablet, TAKE 1 TABLET BY MOUTH EVERY DAY, Disp: 90 tablet, Rfl: 1 .  glipiZIDE (GLUCOTROL) 5 MG tablet, TAKE 1 TABLET (5 MG TOTAL) BY MOUTH 2 (TWO) TIMES DAILY BEFORE A MEAL., Disp: 180 tablet, Rfl: 1 .  hydrALAZINE (APRESOLINE) 25 MG tablet, TAKE 1 TABLET BY MOUTH THREE TIMES A DAY, Disp: 270 tablet, Rfl: 1 .  Insulin Glargine (BASAGLAR KWIKPEN) 100 UNIT/ML, Inject 0.26 mLs (26 Units total) into the skin daily., Disp: 15 mL, Rfl: 1 .  levothyroxine (SYNTHROID) 75 MCG tablet, TAKE ONE TABLET BY MOUTH ONCE DAILY BEFORE BREAKFAST, Disp: 90 tablet, Rfl: 1 .  lisinopril (ZESTRIL) 40 MG tablet, TAKE 1 TABLET BY MOUTH EVERY DAY, Disp: 90 tablet, Rfl: 1 .  metoprolol tartrate (LOPRESSOR) 25 MG tablet, TAKE 1 TABLET BY MOUTH TWICE A DAY, Disp: 180 tablet, Rfl: 1 .  NOVOFINE 32G X 6 MM MISC, USE AS DIRECTED WITH LEVEMIR PEN. PLEASE RESCHEDULE MISSED APPT WITH PCP SOON., Disp: 100 each, Rfl: 3 .  rosuvastatin (CRESTOR) 10 MG tablet, TAKE 1 TABLET BY MOUTH EVERY DAY, Disp: 90 tablet, Rfl: 1 .  mirtazapine (REMERON) 7.5 MG tablet, Take 1 tablet (7.5 mg total) by mouth at bedtime., Disp: 90 tablet, Rfl: 1  EXAM:  VITALS per patient if applicable:  GENERAL: alert, oriented, appears well and in no acute distress  HEENT: atraumatic, conjunttiva clear, no obvious abnormalities on inspection of external nose and ears  NECK: normal movements of the head and neck  LUNGS: on inspection no signs of respiratory distress, breathing rate appears normal, no  obvious gross SOB, gasping or wheezing  CV: no obvious cyanosis  MS: moves all visible extremities without noticeable abnormality  PSYCH/NEURO: pleasant and cooperative, flat affect, speech and thought processing grossly intact  ASSESSMENT AND PLAN:  Discussed the following assessment and plan:  Renovascular hypertension Undetermined control though patient reports its been well controlled previously.  She will continue amlodipine 10 mg once daily, hydralazine 25 mg 3 times daily, lisinopril 40 mg once daily, and metoprolol 25 mg twice daily.  Diabetes (HCC) Seems to be well controlled.  We will have her in for an A1c.  She will continue Trulicity 3 mg once daily, Jardiance 25 mg once daily, glipizide 1 tablet before big meal, and Basaglar 26 units once daily.  I encouraged her to see the eye doctor for her yearly exam.  Hypothyroidism Continue Synthroid.  Check TSH.  Insomnia Ongoing issue.  I suspect anxiety and depression are playing a role.  We will start her on Remeron to see if we can help treat her anxiety and depression as well as see if this will help her sleep.  She will no longer use the trazodone.  Anxiety and depression Reports she would be willing to start on medication for this.  I suspect this is contributing to her sleeping difficulty.  We will start on Remeron.  Discussed risk of increased appetite and if that occurs she should let us know.  Discussed risk of drowsiness.  If it is excessively drowsy the next day she is to let us know.   Orders Placed This Encounter  Procedures  . TSH  . HgB A1c    Meds ordered this encounter  Medications  . mirtazapine (REMERON) 7.5 MG tablet    Sig: Take 1 tablet (7.5 mg total) by mouth at bedtime.    Dispense:  90 tablet    Refill:  1     I discussed the assessment and treatment plan with the patient. The patient was provided an opportunity to ask questions and all were answered. The patient agreed with the plan and  demonstrated an understanding of the instructions.   The patient was advised to call back or seek an in-person evaluation if the symptoms worsen or if the condition fails to improve as anticipated.   Marikay Alar, MD

## 2019-12-16 NOTE — Assessment & Plan Note (Addendum)
Ongoing issue.  I suspect anxiety and depression are playing a role.  We will start her on Remeron to see if we can help treat her anxiety and depression as well as see if this will help her sleep.  She will no longer use the trazodone.

## 2019-12-23 ENCOUNTER — Other Ambulatory Visit: Payer: Self-pay

## 2019-12-23 ENCOUNTER — Other Ambulatory Visit (INDEPENDENT_AMBULATORY_CARE_PROVIDER_SITE_OTHER): Payer: PPO

## 2019-12-23 DIAGNOSIS — E039 Hypothyroidism, unspecified: Secondary | ICD-10-CM

## 2019-12-23 DIAGNOSIS — Z794 Long term (current) use of insulin: Secondary | ICD-10-CM

## 2019-12-23 DIAGNOSIS — N1831 Chronic kidney disease, stage 3a: Secondary | ICD-10-CM

## 2019-12-23 DIAGNOSIS — E1121 Type 2 diabetes mellitus with diabetic nephropathy: Secondary | ICD-10-CM

## 2019-12-23 LAB — HEMOGLOBIN A1C: Hgb A1c MFr Bld: 5.9 % (ref 4.6–6.5)

## 2019-12-23 LAB — TSH: TSH: 2.58 u[IU]/mL (ref 0.35–4.50)

## 2019-12-25 ENCOUNTER — Encounter: Payer: Self-pay | Admitting: Family Medicine

## 2020-01-01 MED ORDER — SERTRALINE HCL 50 MG PO TABS: 50.0000 mg | ORAL_TABLET | Freq: Every day | ORAL | 3 refills | Status: DC

## 2020-01-01 NOTE — Addendum Note (Signed)
Addended by: Birdie Sons, Makilah Dowda G on: 01/01/2020 11:15 AM   Modules accepted: Orders

## 2020-01-06 ENCOUNTER — Ambulatory Visit (INDEPENDENT_AMBULATORY_CARE_PROVIDER_SITE_OTHER): Payer: PPO | Admitting: Pharmacist

## 2020-01-06 DIAGNOSIS — I150 Renovascular hypertension: Secondary | ICD-10-CM

## 2020-01-06 DIAGNOSIS — Z794 Long term (current) use of insulin: Secondary | ICD-10-CM

## 2020-01-06 DIAGNOSIS — F419 Anxiety disorder, unspecified: Secondary | ICD-10-CM

## 2020-01-06 DIAGNOSIS — N1831 Chronic kidney disease, stage 3a: Secondary | ICD-10-CM | POA: Diagnosis not present

## 2020-01-06 DIAGNOSIS — E1122 Type 2 diabetes mellitus with diabetic chronic kidney disease: Secondary | ICD-10-CM | POA: Diagnosis not present

## 2020-01-06 DIAGNOSIS — F32A Depression, unspecified: Secondary | ICD-10-CM

## 2020-01-06 DIAGNOSIS — I7025 Atherosclerosis of native arteries of other extremities with ulceration: Secondary | ICD-10-CM

## 2020-01-06 NOTE — Chronic Care Management (AMB) (Signed)
Chronic Care Management   Follow Up Note   01/06/2020 Name: Danielle Park MRN: [id number] DOB: Aug 16, 1950  Referred by: Leone Haven, MD Reason for referral : Chronic Care Management (Medication Management)   Danielle Park is a 69 y.o. year old female who is a primary care patient of Caryl Bis, Angela Adam, MD. The CCM team was consulted for assistance with chronic disease management and care coordination needs.    Contacted patient for medication management .  Review of patient status, including review of consultants reports, relevant laboratory and other test results, and collaboration with appropriate care team members and the patient's provider was performed as part of comprehensive patient evaluation and provision of chronic care management services.    SDOH (Social Determinants of Health) assessments performed: Yes See Care Plan activities for detailed interventions related to SDOH)  SDOH Interventions     Most Recent Value  SDOH Interventions  SDOH Interventions for the Following Domains Depression  Financial Strain Interventions Other (Comment)  [manufacturer assistance]  Depression Interventions/Treatment  Counseling, Patient refuses Treatment       Outpatient Encounter Medications as of 01/06/2020  Medication Sig Note  . amLODipine (NORVASC) 10 MG tablet TAKE 1 TABLET BY MOUTH EVERY DAY   . aspirin 81 MG tablet Take 81 mg by mouth daily.   . cholecalciferol (VITAMIN D3) 25 MCG (1000 UT) tablet Take 2,000 Units by mouth daily.   . clopidogrel (PLAVIX) 75 MG tablet TAKE 1 TABLET (75 MG TOTAL) BY MOUTH DAILY.   . Dulaglutide (TRULICITY) 3 UY/4.0HK SOPN Inject 3 mg into the skin once a week.   . empagliflozin (JARDIANCE) 25 MG TABS tablet Take 25 mg by mouth daily before breakfast.   . furosemide (LASIX) 20 MG tablet TAKE 1 TABLET BY MOUTH EVERY DAY   . glipiZIDE (GLUCOTROL) 5 MG tablet TAKE 1 TABLET (5 MG TOTAL) BY MOUTH 2 (TWO) TIMES DAILY BEFORE A MEAL. 01/06/2020:  Usually taking ~ 2 pm  . hydrALAZINE (APRESOLINE) 25 MG tablet TAKE 1 TABLET BY MOUTH THREE TIMES A DAY 01/06/2020: Taking BID  . Insulin Glargine (BASAGLAR KWIKPEN) 100 UNIT/ML Inject 0.26 mLs (26 Units total) into the skin daily. 01/06/2020: 22 units  . levothyroxine (SYNTHROID) 75 MCG tablet TAKE ONE TABLET BY MOUTH ONCE DAILY BEFORE BREAKFAST   . lisinopril (ZESTRIL) 40 MG tablet TAKE 1 TABLET BY MOUTH EVERY DAY 10/20/2019: QAM  . metoprolol tartrate (LOPRESSOR) 25 MG tablet TAKE 1 TABLET BY MOUTH TWICE A DAY   . NOVOFINE 32G X 6 MM MISC USE AS DIRECTED WITH LEVEMIR PEN. PLEASE RESCHEDULE MISSED APPT WITH PCP SOON.   . rosuvastatin (CRESTOR) 10 MG tablet TAKE 1 TABLET BY MOUTH EVERY DAY   . cyclobenzaprine (FLEXERIL) 5 MG tablet Take 1 tablet (5 mg total) by mouth 3 (three) times daily as needed for muscle spasms. (Patient not taking: Reported on 01/06/2020)   . [DISCONTINUED] sertraline (ZOLOFT) 50 MG tablet Take 1 tablet (50 mg total) by mouth daily. (Patient not taking: Reported on 01/06/2020)    No facility-administered encounter medications on file as of 01/06/2020.     Objective:   Goals Addressed              This Visit's Progress     Patient Stated   .  "I want to get my diabetes better controlled" (pt-stated)        CARE PLAN ENTRY (see longtitudinal plan of care for additional care plan information)  Current Barriers:  .  Social, financial, or community needs:  o Notes that she took sertraline for about 3 days, but felt "foggy" so she stopped. Is concerned about taking medications "that affect my brain". Notes that she feels like she is feeling better with the passing of her brother.  o Medicare Coverage Gap - working on assistance for [employer] . Diabetes, controlled; most recent A1c 5.9% . Most recent eGFR 40 mL/min . 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 Denies episodes of hypoglycemia  . Current glucose readings:  o Fasting: 80-90s; highest 109 o 2 hour post prandial: 110-150s; yesterday it was 230 (but she ate a chickfila sandwich) . Cardiovascular risk reduction: o Current hypertensive regimen: lisinopril 40 mg QAM, amlodipine 10 mg QAM, furosemide 20 mg QAM; hydralazine 25 mg BID, metoprolol tartrate 25 mg BID - Home BP readings: 120s/60s, denies episodes of hypotension o Current hyperlipidemia regimen: rosuvastatin 10 mg daily, LDL well controlled <70 on last check o Antiplatelet therapy (PVD, renal artery stenting in 2014): clopidogrel 75 mg daily, ASA 81 mg daily  - both agents recommended by vascular . Hypothyroidism: levothyroxine 75 mcg daily  . Insomnia/grief: started on mirtazapine by PCP at last visit, but patient reported significant next-day grogginess. Sertraline 50 mg started, but patient self-d/c. Denies any mental health needs at this time  Pharmacist Clinical Goal(s):  Marland Kitchen Over the next 90 days, patient with work with PharmD and primary care provider to address optimized medication management  Interventions: . Comprehensive medication review performed, medication list updated in electronic medical record . Inter-disciplinary care team collaboration (see longitudinal plan of care) . Continue current regimen- Basaglar 22 units daily, Jardiance 25 mg daily, glipizide 5 mg w/ biggest meal, Trulicity 3 mg weekly . Educated on mechanism of SSRIs, discussed normalcy of mental health diagnoses. Provided reassurance. Patient still elects to d/c sertraline and continue to monitor. Encouraged to contact me or Dr. Caryl Bis if she  changes her mind, or if grief/anxiety related to the loss of her brother starts to negatively impact her ability to perform everyday tasks. She verbalizes understanding. Will communicate w/ PCP to see if f/u next month as previously scheduled is needed, or defer for another month to be 3 months f/u (late December) . Home BP at goal. Continue current regimen.  Marland Kitchen Prepared sample Jardiance for patient to bridge until patient assistance is approved.   Patient Self Care Activities:  . Patient will check blood glucose BID, document, and provide at future appointments.  . Patient will check BP periodically, document, and provide at future appointments . Patient will notify provider if mental health declines . Patient will take medications as prescribed . Patient will contact provider with any episodes of hypoglycemia . Patient will report any questions or concerns to provider   Please see past updates related to this goal by clicking on the "Past Updates" button in the selected goal          Plan:  - Scheduled f/u call in ~ 8 weeks  [name], PharmD, [place], [place] [phone]  Medication Samples  have been provided to the patient.  Drug name: Jardiance       Strength: 25 mg        Qty: 3 boxes (21 tab)  LOT: [id number]  Exp.Date: 10/2021

## 2020-01-06 NOTE — Patient Instructions (Addendum)
Danielle Park,   It was great talking with you today!   Here is 3 weeks of Jardiance to help tide you over until we can get supply from Patient Assistance.   Please call us if you feel you need support with depression or anxiety.  [name], PharmD [phone]  Visit Information  Goals Addressed              This Visit's Progress     Patient Stated   .  "I want to get my diabetes better controlled" (pt-stated)        CARE PLAN ENTRY (see longtitudinal plan of care for additional care plan information)  Current Barriers:  . Social, financial, or community needs:  o Notes that she took sertraline for about 3 days, but felt "foggy" so she stopped. Is concerned about taking medications "that affect my brain". Notes that she feels like she is feeling better with the passing of her brother.  o Medicare Coverage Gap - working on assistance for [employer] . Diabetes, controlled; most recent A1c 5.9% . Most recent eGFR 40 mL/min . 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 Patient mailed applications to CPhT last week . Denies episodes of hypoglycemia  . Current glucose readings:  o Fasting: 80-90s; highest 109 o 2 hour post prandial: 110-150s; yesterday it was 230 (but she ate a chickfila sandwich) . Cardiovascular risk reduction: o Current hypertensive regimen: lisinopril 40 mg QAM, amlodipine 10 mg QAM, furosemide 20 mg QAM; hydralazine 25 mg BID, metoprolol tartrate 25 mg BID - Home BP readings: 120s/60s, denies episodes of hypotension o Current hyperlipidemia regimen: rosuvastatin 10 mg daily, LDL well controlled <70 on last  check o Antiplatelet therapy (PVD, renal artery stenting in 2014): clopidogrel 75 mg daily, ASA 81 mg daily  - both agents recommended by vascular . Hypothyroidism: levothyroxine 75 mcg daily  . Insomnia/grief: started on mirtazapine by PCP at last visit, but patient reported significant next-day grogginess. Sertraline 50 mg started, but patient self-d/c. Denies any mental health needs at this time  Pharmacist Clinical Goal(s):  Marland Kitchen Over the next 90 days, patient with work with PharmD and primary care provider to address optimized medication management  Interventions: . Comprehensive medication review performed, medication list updated in electronic medical record . Inter-disciplinary care team collaboration (see longitudinal plan of care) . Continue current regimen- Basaglar 22 units daily, Jardiance 25 mg daily, glipizide 5 mg w/ biggest meal, Trulicity 3 mg weekly . Educated on mechanism of SSRIs, discussed normalcy of mental health diagnoses. Provided reassurance. Patient still elects to d/c sertraline and continue to monitor. Encouraged to contact me or Dr. Caryl Bis if she changes her mind, or if grief/anxiety related to the loss of her brother starts to negatively impact her ability to perform everyday tasks. She verbalizes understanding. Will communicate w/ PCP to see if f/u next month as previously scheduled is needed, or defer for another month to be 3 months f/u (late December) . Home BP at goal. Continue current regimen.  Marland Kitchen Prepared sample Jardiance for patient to bridge until patient assistance is approved.   Patient Self Care Activities:  . Patient will check  blood glucose BID, document, and provide at future appointments.  . Patient will check BP periodically, document, and provide at future appointments . Patient will notify provider if mental health declines . Patient will take medications as prescribed . Patient will contact provider with any episodes of hypoglycemia . Patient  will report any questions or concerns to provider   Please see past updates related to this goal by clicking on the "Past Updates" button in the selected goal         Print copy of patient instructions provided.  Plan:  - Scheduled f/u call in ~ 8 weeks  [name], PharmD, [place], [place] Pharmacist [place] [phone]

## 2020-01-13 ENCOUNTER — Other Ambulatory Visit: Payer: Self-pay | Admitting: Family Medicine

## 2020-02-05 ENCOUNTER — Other Ambulatory Visit: Payer: Self-pay

## 2020-02-05 DIAGNOSIS — Z794 Long term (current) use of insulin: Secondary | ICD-10-CM

## 2020-02-05 DIAGNOSIS — E1122 Type 2 diabetes mellitus with diabetic chronic kidney disease: Secondary | ICD-10-CM

## 2020-02-05 MED ORDER — TRULICITY 3 MG/0.5ML ~~LOC~~ SOAJ: 3.0000 mg | SUBCUTANEOUS | 0 refills | Status: DC

## 2020-02-12 ENCOUNTER — Other Ambulatory Visit: Payer: Self-pay | Admitting: Family Medicine

## 2020-02-16 ENCOUNTER — Ambulatory Visit: Payer: PPO | Admitting: Family Medicine

## 2020-03-03 ENCOUNTER — Ambulatory Visit: Payer: PPO | Admitting: Pharmacist

## 2020-03-03 DIAGNOSIS — F419 Anxiety disorder, unspecified: Secondary | ICD-10-CM

## 2020-03-03 DIAGNOSIS — I150 Renovascular hypertension: Secondary | ICD-10-CM

## 2020-03-03 DIAGNOSIS — I7025 Atherosclerosis of native arteries of other extremities with ulceration: Secondary | ICD-10-CM

## 2020-03-03 DIAGNOSIS — E039 Hypothyroidism, unspecified: Secondary | ICD-10-CM

## 2020-03-03 DIAGNOSIS — N1831 Chronic kidney disease, stage 3a: Secondary | ICD-10-CM

## 2020-03-03 DIAGNOSIS — F32A Depression, unspecified: Secondary | ICD-10-CM
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
## 2020-03-03 NOTE — Patient Instructions (Addendum)
[name],   It was great talking to you today!  When you finish your supply of Trulicity 3 mg, let's increase to 4.5 mg weekly. I will order that with Basaglar from [facility] for 2022. We will also re-apply for Jardiance 25 mg from [facility].   Be on the lookout for the packet of paperwork to fill out from [place], my Pharmacologist.   Call [phone] (Mon-Fri 7 a.m. to 7 p.m) or go to [email or web address] to sign up for your booster dose.   Call me with any questions or concerns!  [name], PharmD [phone]   Visit Information Patient Care Plan: Medication Management    Problem Identified: Diabetes, Hypertension, Hyperlipidemia     Long-Range Goal: Disease Progression Prevention   This Visit's Progress: On track  Priority: High  Note:   Current Barriers:  . Unable to independently afford treatment regimen . Complex patient with multiple comorbidities   Pharmacist Clinical Goal(s):  Marland Kitchen Over the next 30 days, patient will verbalize ability to afford treatment regimen. . Over the next 90 days, patient will maintain control of diabetes as evidenced by A1c through collaboration with PharmD and provider.   Interventions: . Inter-disciplinary care team collaboration (see longitudinal plan of care) . Comprehensive medication review performed; medication list updated in electronic medical record  Diabetes: . Controlled; current treatment: Trulicity 3 mg weekly, Basaglar 22 units daily, Jardiance 25 mg daily, glipizide 5 mg w/ large evening meal  o Avoiding metformin d/t single kidney per documentation from prior PCP . Reports that she feels her appetite "is back" in comparison to how she felt when first starting/increasing Trulicity . Current glucose readings: fasting glucose: 80-110s, post prandial glucose: 120-210s . Denies hypoglycemic symptoms . Due to increased hunger and elevated post prandials, will increase Trulicity. She will continue Trulicity 3 mg for now  to complete her current supply, but we will go ahead and apply for Trulicity 4.5 mg pen strength. She will increase to 4.5 mg weekly when she completes her current 3 mg weekly supply.  . Continue Basaglar 22 units daily, Jardiance 25 mg daily, glipizide 5 mg w/ largest meal. Discussed that we may be able to reduce Basaglar and glipizide moving forward, pending glycemic benefit of Trulicity dose increase.   Hypertension: . Controlled; current treatment: lisinopril 40 mg QAM, amlodipine 10 mg QAM, furosemide 20 mg QAM; hydralazine 25 mg BID, metoprolol tartrate 25 mg BID . Current home readings: 120/70-80s, notes she is concerned about diastolic readings being higher than she is used to . Denies hypotensive or hypertensive symptoms . Recommended she continue current therapy at this time. Discussed impact of fluid status on systolic BP  Hyperlipidemia and Cardiovascular Risk Reduction: . Controlled; current treatment: rosuvastatin 10 mg daily . Antiplatelet treatment (PVD, hx renal artery stenting): aspirin 81 mg daily, clopidogrel 75 mg daily . Recommended to continue current regimen at this time  Hypothyroidism: . Controlled; current treatment: levothyroxine 75 mg daily   Immunizations: . Discussed COVID booster. Provided phone number and website to sign up for a booster with [facility]. Updated chart to include influenza vaccine received 01/01/2020  Patient Goals/Self-Care Activities . Over the next 90 days, patient will:  - take medications as prescribed check blood glucose BID, document, and provide at future appointments collaborate with provider on medication access solutions  Follow Up Plan: Telephone follow up appointment with care management team member scheduled for:~ 8 weeks       The patient verbalized understanding of  instructions, educational materials, and care plan provided today and agreed to receive a mailed copy of patient instructions, educational materials, and care  plan.   Plan: Telephone follow up appointment with care management team member scheduled for: ~ 8 weeks  [name], PharmD, [place], CPP Clinical Pharmacist [facility] [employer] [phone]

## 2020-03-06 ENCOUNTER — Ambulatory Visit: Payer: PPO | Attending: Internal Medicine

## 2020-03-06 DIAGNOSIS — Z23 Encounter for immunization: Secondary | ICD-10-CM

## 2020-03-06 NOTE — Progress Notes (Signed)
° °  Covid-19 Vaccination Clinic  Name:  RAYVN RICKERSON    MRN: [id number] DOB: Jun 20, 1950  03/06/2020  Ms. Ollinger was observed post Covid-19 immunization for 15 minutes without incident. She was provided with Vaccine Information Sheet and instruction to access the V-Safe system.   Ms. Oland was instructed to call 911 with any severe reactions post vaccine:  Difficulty breathing   Swelling of face and throat   A fast heartbeat   A bad rash all over body   Dizziness and weakness   Immunizations Administered    Name Date Dose VIS Date Route   Pfizer COVID-19 Vaccine 03/06/2020  2:06 PM 0.3 mL 01/14/2020 Intramuscular   Manufacturer: [employer]   Lot: [id number]   NDC: [id number]

## 2020-03-24 ENCOUNTER — Other Ambulatory Visit: Payer: Self-pay

## 2020-03-29 ENCOUNTER — Other Ambulatory Visit: Payer: Self-pay

## 2020-03-29 ENCOUNTER — Encounter: Payer: Self-pay | Admitting: Family Medicine

## 2020-03-29 ENCOUNTER — Ambulatory Visit: Payer: PPO | Admitting: Pharmacist

## 2020-03-29 ENCOUNTER — Ambulatory Visit (INDEPENDENT_AMBULATORY_CARE_PROVIDER_SITE_OTHER): Payer: PPO | Admitting: Family Medicine

## 2020-03-29 DIAGNOSIS — N1831 Chronic kidney disease, stage 3a: Secondary | ICD-10-CM | POA: Diagnosis not present

## 2020-03-29 DIAGNOSIS — E1122 Type 2 diabetes mellitus with diabetic chronic kidney disease: Secondary | ICD-10-CM

## 2020-03-29 DIAGNOSIS — F439 Reaction to severe stress, unspecified: Secondary | ICD-10-CM

## 2020-03-29 DIAGNOSIS — I150 Renovascular hypertension: Secondary | ICD-10-CM | POA: Diagnosis not present

## 2020-03-29 DIAGNOSIS — Z794 Long term (current) use of insulin: Secondary | ICD-10-CM | POA: Diagnosis not present

## 2020-03-29 DIAGNOSIS — I7025 Atherosclerosis of native arteries of other extremities with ulceration: Secondary | ICD-10-CM

## 2020-03-29 LAB — RENAL FUNCTION PANEL
Albumin: 4.5 g/dL (ref 3.5–5.2)
BUN: 16 mg/dL (ref 6–23)
CO2: 25 mEq/L (ref 19–32)
Calcium: 9.3 mg/dL (ref 8.4–10.5)
Chloride: 105 mEq/L (ref 96–112)
Creatinine, Ser: 1.34 mg/dL — ABNORMAL HIGH (ref 0.40–1.20)
GFR: 40.54 mL/min — ABNORMAL LOW (ref >60.00)
Glucose, Bld: 115 mg/dL — ABNORMAL HIGH (ref 70–99)
Phosphorus: 3.5 mg/dL (ref 2.3–4.6)
Potassium: 3.8 mEq/L (ref 3.5–5.1)
Sodium: 137 mEq/L (ref 135–145)

## 2020-03-29 LAB — HEMOGLOBIN A1C: Hgb A1c MFr Bld: 5.9 % (ref 4.6–6.5)

## 2020-03-29 MED ORDER — BASAGLAR KWIKPEN 100 UNIT/ML ~~LOC~~ SOPN: 22.0000 [IU] | PEN_INJECTOR | Freq: Every day | SUBCUTANEOUS | 1 refills | Status: DC

## 2020-03-29 MED ORDER — NOVOFINE PLUS PEN NEEDLE 32G X 4 MM MISC: 1.0000 | 3 refills | Status: DC

## 2020-03-29 NOTE — Patient Instructions (Signed)
Nice to see you. We will let you know what your lab results reveal. Please make sure you see the eye doctor as discussed.

## 2020-03-29 NOTE — Assessment & Plan Note (Signed)
Adequately controlled at home.  She will continue to monitor there.  She will continue amlodipine 10 mg once daily, Lasix 20 mg daily, hydralazine 25 mg 3 times daily, lisinopril 40 mg daily, and metoprolol 25 mg twice daily.  Check renal function panel.

## 2020-03-29 NOTE — Assessment & Plan Note (Signed)
Offered support.  Discussed that if she needs anything from [place] at any point she can let us know.

## 2020-03-29 NOTE — Assessment & Plan Note (Signed)
>>  ASSESSMENT AND PLAN FOR CONTROLLED DIABETES MELLITUS TYPE 2 WITH COMPLICATIONS (HCC) WRITTEN ON 03/29/2020 11:29 AM BY SONNENBERG, ERIC G, MD  Seems to be well controlled.  Check A1c.  Continue Trulicity  3 mg once weekly, Jardiance  25 mg daily, glipizide  5 mg twice daily before meals, and Basaglar  22 units once daily.  I encouraged her to follow-up with her eye doctor.

## 2020-03-29 NOTE — Progress Notes (Signed)
Marikay Alar, MD Phone: [phone]  Danielle Park is a 70 y.o. female who presents today for f/u.  HYPERTENSION  Disease Monitoring  Home BP Monitoring 113/69 at home  Chest pain- no    Dyspnea- no Medications  Compliance-  Taking amlodipine, hydralazine, lisinopril, losartan.  Edema- no change to chronic edema  DIABETES Disease Monitoring: Blood Sugar ranges-80 fasting, 150-180 postprandial Polyuria/phagia/dipsia- no      Optho- due Medications: Compliance- taking trulicity, jardiance, glipizide, basaglar 22 u Hypoglycemic symptoms- no  Stress: Patient's son likely has cancer.  He has a mass in his throat and she is taking him to do later this week for evaluation.  She notes she is dealing with this relatively well.  Denies depression.   Social History   Tobacco Use  Smoking Status Never Smoker  Smokeless Tobacco Never Used     ROS see history of present illness  Objective  Physical Exam Vitals:   03/29/20 1112 03/29/20 1134  BP: (!) 160/70 124/72  Pulse: 74   Temp: 98.3 F (36.8 C)   SpO2: 98%     BP Readings from Last 3 Encounters:  03/29/20 124/72  11/04/19 (!) 150/70  10/21/19 106/65   Wt Readings from Last 3 Encounters:  03/29/20 188 lb 3.2 oz (85.4 kg)  12/16/19 192 lb (87.1 kg)  11/04/19 192 lb 12.8 oz (87.5 kg)    Physical Exam Constitutional:      General: She is not in acute distress.    Appearance: She is not diaphoretic.  Cardiovascular:     Rate and Rhythm: Normal rate and regular rhythm.     Heart sounds: Normal heart sounds.  Pulmonary:     Effort: Pulmonary effort is normal.     Breath sounds: Normal breath sounds.  Musculoskeletal:        General: No edema.     Right lower leg: No edema.     Left lower leg: No edema.  Skin:    General: Skin is warm and dry.  Neurological:     Mental Status: She is alert.      Assessment/Plan: Please see individual problem list.  Problem List Items Addressed This Visit    Diabetes  (HCC) (Chronic)    Seems to be well controlled.  Check A1c.  Continue Trulicity 3 mg once weekly, Jardiance 25 mg daily, glipizide 5 mg twice daily before meals, and Basaglar 22 units once daily.  I encouraged her to follow-up with her eye doctor.      Relevant Medications   Insulin Glargine (BASAGLAR KWIKPEN) 100 UNIT/ML   Other Relevant Orders   HgB A1c   Renovascular hypertension    Adequately controlled at home.  She will continue to monitor there.  She will continue amlodipine 10 mg once daily, Lasix 20 mg daily, hydralazine 25 mg 3 times daily, lisinopril 40 mg daily, and metoprolol 25 mg twice daily.  Check renal function panel.      Relevant Orders   PTH, intact (no Ca)   Renal Function Panel   Stress    Offered support.  Discussed that if she needs anything from [place] at any point she can let us know.         This visit occurred during the SARS-CoV-2 public health emergency.  Safety protocols were in place, including screening questions prior to the visit, additional usage of staff PPE, and extensive cleaning of exam room while observing appropriate contact time as indicated for disinfecting solutions.  Tommi Rumps, MD [place]

## 2020-03-29 NOTE — Patient Instructions (Signed)
Visit Information  Patient Care Plan: Medication Management    Problem Identified: Diabetes, Hypertension, Hyperlipidemia     Long-Range Goal: Disease Progression Prevention   This Visit's Progress: On track  Recent Progress: On track  Priority: High  Note:   Current Barriers:  . Unable to independently afford treatment regimen . Complex patient with multiple comorbidities   Pharmacist Clinical Goal(s):  Marland Kitchen Over the next 30 days, patient will verbalize ability to afford treatment regimen. . Over the next 90 days, patient will maintain control of diabetes as evidenced by A1c through collaboration with PharmD and provider.   Interventions: . Inter-disciplinary care team collaboration (see longitudinal plan of care) . Comprehensive medication review performed; medication list updated in electronic medical record  Diabetes: . Controlled; current treatment: Trulicity 3 mg weekly (plan to increase to 4.5 mg weekly to reduce hypoglycemic prone components of regimen), Basaglar 22 units daily, Jardiance 25 mg daily, glipizide 5 mg w/ large evening meal  o Avoiding metformin d/t single kidney per documentation from prior PCP . Received patient portions of [name] and [employer] applications for assistance. Submitted to [employer] (Trulicity 4.5 mg weekly, Hospital doctor) and [employer] (Jardiance 25 mg daily). Will collaborate w/ CPhT for follow up. A1c pending.   Hypertension: . Controlled; current treatment: lisinopril 40 mg QAM, amlodipine 10 mg QAM, furosemide 20 mg QAM; hydralazine 25 mg BID, metoprolol tartrate 25 mg BID . Recommended she continue current therapy at this time  Hyperlipidemia and Cardiovascular Risk Reduction: . Controlled; current treatment: rosuvastatin 10 mg daily . Antiplatelet treatment (PVD, hx renal artery stenting): aspirin 81 mg daily, clopidogrel 75 mg daily . Recommended to continue current regimen at this time  Hypothyroidism: . Controlled; current treatment: levothyroxine 75 mg  daily   Patient Goals/Self-Care Activities . Over the next 90 days, patient will:  - take medications as prescribed check blood glucose BID, document, and provide at future appointments collaborate with provider on medication access solutions  Follow Up Plan: Telephone follow up appointment with care management team member scheduled for:~ 6 weeks as previously scheduled        The patient verbalized understanding of instructions, educational materials, and care plan provided today and declined offer to receive copy of patient instructions, educational materials, and care plan.   Plan: Telephone follow up appointment with care management team member scheduled for:~ 6 weeks as previously scheduled  [name], PharmD, [place], CPP Clinical Pharmacist [facility] [employer] [phone]

## 2020-03-29 NOTE — Assessment & Plan Note (Signed)
>>  ASSESSMENT AND PLAN FOR STRESS WRITTEN ON 03/29/2020 11:27 AM BY MARIBETH, ERIC G, MD  Offered support.  Discussed that if she needs anything from us  at any point she can let us  know.
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
## 2020-03-29 NOTE — Assessment & Plan Note (Addendum)
Seems to be well controlled.  Check A1c.  Continue Trulicity 3 mg once weekly, Jardiance 25 mg daily, glipizide 5 mg twice daily before meals, and Basaglar 22 units once daily.  I encouraged her to follow-up with her eye doctor.

## 2020-03-31 LAB — EXTRA SPECIMEN

## 2020-03-31 LAB — PARATHYROID HORMONE, INTACT (NO CA)

## 2020-04-09 ENCOUNTER — Encounter (INDEPENDENT_AMBULATORY_CARE_PROVIDER_SITE_OTHER): Payer: PPO

## 2020-04-09 ENCOUNTER — Ambulatory Visit (INDEPENDENT_AMBULATORY_CARE_PROVIDER_SITE_OTHER): Payer: PPO | Admitting: Vascular Surgery

## 2020-04-14 ENCOUNTER — Other Ambulatory Visit: Payer: Self-pay | Admitting: Family Medicine

## 2020-04-17 ENCOUNTER — Other Ambulatory Visit: Payer: Self-pay | Admitting: Family Medicine

## 2020-04-27 ENCOUNTER — Other Ambulatory Visit: Payer: Self-pay | Admitting: Family Medicine

## 2020-04-27 DIAGNOSIS — E1122 Type 2 diabetes mellitus with diabetic chronic kidney disease: Secondary | ICD-10-CM

## 2020-05-10 ENCOUNTER — Telehealth: Payer: Self-pay | Admitting: Family Medicine

## 2020-05-10 NOTE — Telephone Encounter (Signed)
Please find out what day they tested Park. Do they have any symptoms? If so, Danielle did they start? Have they been vaccinated?

## 2020-05-10 NOTE — Telephone Encounter (Signed)
Patient called in to say that she and her husband test positive for covid the weekend wanted to know if it was something she should be doing they feel fine right now

## 2020-05-11 NOTE — Telephone Encounter (Signed)
Noted. Her husband should certainly be seen if he is short of breath. He could complete a virtual visit though he could also go to urgent care for evaluation. They both might qualify for one of the oral medications or infusions. Would they like to be referred for that? She could also complete a virtual visit if she would like. They need to remain quarantined at home until they are at least 10 days from symptom onset and have had at least 24 hours of improved symptoms with no fevers. If they develop any worsening symptoms they should be seen right away.

## 2020-05-11 NOTE — Telephone Encounter (Signed)
I called and spoke with patient and she stated she and husband tested positive on Saturday with a home test.  Her symptoms are runny nose and sneezing and her symptoms started Tuesday, Her husbands symptoms started on Monday and he mainly has SOB.  The daughter checked the oxygen level on yesterday and it was 97 for both.  They both have had all 3 vaccinations for covid. Nina,cma

## 2020-05-12 NOTE — Telephone Encounter (Signed)
I called and spoke with the patients and the wife stated she is better and on her way to recovering and she is taking her husband to urgent care for the SOB they do not want the infusion and I explained the quarantine to them.  Saifullah Jolley,cma

## 2020-05-12 NOTE — Telephone Encounter (Signed)
Noted  

## 2020-05-18 ENCOUNTER — Ambulatory Visit (INDEPENDENT_AMBULATORY_CARE_PROVIDER_SITE_OTHER): Payer: PPO | Admitting: Pharmacist

## 2020-05-18 DIAGNOSIS — F419 Anxiety disorder, unspecified: Secondary | ICD-10-CM

## 2020-05-18 DIAGNOSIS — I701 Atherosclerosis of renal artery: Secondary | ICD-10-CM

## 2020-05-18 DIAGNOSIS — N1831 Chronic kidney disease, stage 3a: Secondary | ICD-10-CM

## 2020-05-18 DIAGNOSIS — Z794 Long term (current) use of insulin: Secondary | ICD-10-CM | POA: Diagnosis not present

## 2020-05-18 DIAGNOSIS — E1122 Type 2 diabetes mellitus with diabetic chronic kidney disease: Secondary | ICD-10-CM | POA: Diagnosis not present

## 2020-05-18 DIAGNOSIS — E039 Hypothyroidism, unspecified: Secondary | ICD-10-CM

## 2020-05-18 DIAGNOSIS — I739 Peripheral vascular disease, unspecified: Secondary | ICD-10-CM

## 2020-05-18 DIAGNOSIS — F32A Depression, unspecified: Secondary | ICD-10-CM | POA: Diagnosis not present

## 2020-05-18 DIAGNOSIS — I150 Renovascular hypertension: Secondary | ICD-10-CM

## 2020-05-18 DIAGNOSIS — N1832 Chronic kidney disease, stage 3b: Secondary | ICD-10-CM

## 2020-05-18 DIAGNOSIS — I7025 Atherosclerosis of native arteries of other extremities with ulceration: Secondary | ICD-10-CM
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
## 2020-05-18 NOTE — Patient Instructions (Signed)
Visit Information  PATIENT GOALS: Goals Addressed              This Visit's Progress     Patient Stated   .  Medication Monitoring (pt-stated)        Patient Goals/Self-Care Activities . Over the next 90 days, patient will:  - take medications as prescribed check blood glucose BID, document, and provide at future appointments collaborate with provider on medication access solutions       Patient verbalizes understanding of instructions provided today and agrees to view in MyChart.   Plan: Telephone follow up appointment with care management team member scheduled for:  ~ 8 weeks  [name], PharmD, [place], CPP Clinical Pharmacist [employer] at [employer] [phone]

## 2020-05-27 ENCOUNTER — Ambulatory Visit (INDEPENDENT_AMBULATORY_CARE_PROVIDER_SITE_OTHER): Payer: PPO

## 2020-05-27 ENCOUNTER — Other Ambulatory Visit: Payer: Self-pay | Admitting: Family Medicine

## 2020-05-27 VITALS — Ht 63.0 in | Wt 188.0 lb

## 2020-05-27 DIAGNOSIS — Z0000 Encounter for general adult medical examination without abnormal findings: Secondary | ICD-10-CM | POA: Diagnosis not present

## 2020-05-27 NOTE — Patient Instructions (Addendum)
[name] , Thank you for taking time to come for your Medicare Wellness Visit. I appreciate your ongoing commitment to your health goals. Please review the following plan we discussed and let me know if I can assist you in the future.   These are the goals we discussed:  Goals: Schedule diabetic eye exam.   This is a list of the screening recommended for you and due dates:  Health Maintenance  Topic Date Due  . Eye exam for diabetics  02/20/2017  . Mammogram  08/14/2020*  . Colon Cancer Screening  08/14/2020*  . Complete foot exam   08/14/2020  . Hemoglobin A1C  09/26/2020  . Tetanus Vaccine  12/31/2027  . Flu Shot  Completed  . COVID-19 Vaccine  Completed  .  Hepatitis C: One time screening is recommended by Center for Disease Control  (CDC) for  adults born from [birth year] through 1965.   Completed  . Pneumonia vaccines  Completed  . HPV Vaccine  Aged Out  . DEXA scan (bone density measurement)  Discontinued  *Topic was postponed. The date shown is not the original due date.    Immunizations Immunization History  Administered Date(s) Administered  . Influenza, High Dose Seasonal PF 01/29/2017, 12/09/2017, 01/01/2020  . Influenza,inj,Quad PF,6+ Mos 01/15/2019  . Influenza-Unspecified 12/08/2015  . PFIZER(Purple Top)SARS-COV-2 Vaccination 06/26/2019, 07/23/2019, 03/06/2020  . Pneumococcal Conjugate-13 12/30/2017  . Pneumococcal Polysaccharide-23 01/15/2019  . Tdap 12/30/2017   Keep all routine maintenance appointments.   CCM 07/12/20 @ 1:30  Follow up 07/27/20 @ 11:00  Advanced directives: not yet completed.  Conditions/risks identified: none new.  Follow up in one year for your annual wellness visit.   Preventive Care 27 Years and Older, Female Preventive care refers to lifestyle choices and visits with your health care provider that can promote health and wellness. What does preventive care include?  A yearly physical exam. This is also called an annual well  check.  Dental exams once or twice a year.  Routine eye exams. Ask your health care provider how often you should have your eyes checked.  Personal lifestyle choices, including:  Daily care of your teeth and gums.  Regular physical activity.  Eating a healthy diet.  Avoiding tobacco and drug use.  Limiting alcohol use.  Practicing safe sex.  Taking low-dose aspirin every day.  Taking vitamin and mineral supplements as recommended by your health care provider. What happens during an annual well check? The services and screenings done by your health care provider during your annual well check will depend on your age, overall health, lifestyle risk factors, and family history of disease. Counseling  Your health care provider may ask you questions about your:  Alcohol use.  Tobacco use.  Drug use.  Emotional well-being.  Home and relationship well-being.  Sexual activity.  Eating habits.  History of falls.  Memory and ability to understand (cognition).  Work and work Astronomer.  Reproductive health. Screening  You may have the following tests or measurements:  Height, weight, and BMI.  Blood pressure.  Lipid and cholesterol levels. These may be checked every 5 years, or more frequently if you are over 48 years old.  Skin check.  Lung cancer screening. You may have this screening every year starting at age 70 if you have a 30-pack-year history of smoking and currently smoke or have quit within the past 15 years.  Fecal occult blood test (FOBT) of the stool. You may have this test every year starting at  age 29.  Flexible sigmoidoscopy or colonoscopy. You may have a sigmoidoscopy every 5 years or a colonoscopy every 10 years starting at age 93.  Hepatitis C blood test.  Hepatitis B blood test.  Sexually transmitted disease (STD) testing.  Diabetes screening. This is done by checking your blood sugar (glucose) after you have not eaten for a while  (fasting). You may have this done every 1-3 years.  Bone density scan. This is done to screen for osteoporosis. You may have this done starting at age 49.  Mammogram. This may be done every 1-2 years. Talk to your health care provider about how often you should have regular mammograms. Talk with your health care provider about your test results, treatment options, and if necessary, the need for more tests. Vaccines  Your health care provider may recommend certain vaccines, such as:  Influenza vaccine. This is recommended every year.  Tetanus, diphtheria, and acellular pertussis (Tdap, Td) vaccine. You may need a Td booster every 10 years.  Zoster vaccine. You may need this after age 40.  Pneumococcal 13-valent conjugate (PCV13) vaccine. One dose is recommended after age 66.  Pneumococcal polysaccharide (PPSV23) vaccine. One dose is recommended after age 19. Talk to your health care provider about which screenings and vaccines you need and how often you need them. This information is not intended to replace advice given to you by your health care provider. Make sure you discuss any questions you have with your health care provider. Document Released: 04/09/2015 Document Revised: 12/01/2015 Document Reviewed: 01/12/2015 Elsevier Interactive Patient Education  2017 [place] Prevention in the Home Falls can cause injuries. They can happen to people of all ages. There are many things you can do to make your home safe and to help prevent falls. What can I do on the outside of my home?  Regularly fix the edges of walkways and driveways and fix any cracks.  Remove anything that might make you trip as you walk through a door, such as a raised step or threshold.  Trim any bushes or trees on the path to your home.  Use bright outdoor lighting.  Clear any walking paths of anything that might make someone trip, such as rocks or tools.  Regularly check to see if handrails are loose  or broken. Make sure that both sides of any steps have handrails.  Any raised decks and porches should have guardrails on the edges.  Have any leaves, snow, or ice cleared regularly.  Use sand or salt on walking paths during winter.  Clean up any spills in your garage right away. This includes oil or grease spills. What can I do in the bathroom?  Use night lights.  Install grab bars by the toilet and in the tub and shower. Do not use towel bars as grab bars.  Use non-skid mats or decals in the tub or shower.  If you need to sit down in the shower, use a plastic, non-slip stool.  Keep the floor dry. Clean up any water that spills on the floor as soon as it happens.  Remove soap buildup in the tub or shower regularly.  Attach bath mats securely with double-sided non-slip rug tape.  Do not have throw rugs and other things on the floor that can make you trip. What can I do in the bedroom?  Use night lights.  Make sure that you have a light by your bed that is easy to reach.  Do not use any  sheets or blankets that are too big for your bed. They should not hang down onto the floor.  Have a firm chair that has side arms. You can use this for support while you get dressed.  Do not have throw rugs and other things on the floor that can make you trip. What can I do in the kitchen?  Clean up any spills right away.  Avoid walking on wet floors.  Keep items that you use a lot in easy-to-reach places.  If you need to reach something above you, use a strong step stool that has a grab bar.  Keep electrical cords out of the way.  Do not use floor polish or wax that makes floors slippery. If you must use wax, use non-skid floor wax.  Do not have throw rugs and other things on the floor that can make you trip. What can I do with my stairs?  Do not leave any items on the stairs.  Make sure that there are handrails on both sides of the stairs and use them. Fix handrails that are  broken or loose. Make sure that handrails are as long as the stairways.  Check any carpeting to make sure that it is firmly attached to the stairs. Fix any carpet that is loose or worn.  Avoid having throw rugs at the top or bottom of the stairs. If you do have throw rugs, attach them to the floor with carpet tape.  Make sure that you have a light switch at the top of the stairs and the bottom of the stairs. If you do not have them, ask someone to add them for you. What else can I do to help prevent falls?  Wear shoes that:  Do not have high heels.  Have rubber bottoms.  Are comfortable and fit you well.  Are closed at the toe. Do not wear sandals.  If you use a stepladder:  Make sure that it is fully opened. Do not climb a closed stepladder.  Make sure that both sides of the stepladder are locked into place.  Ask someone to hold it for you, if possible.  Clearly mark and make sure that you can see:  Any grab bars or handrails.  First and last steps.  Where the edge of each step is.  Use tools that help you move around (mobility aids) if they are needed. These include:  Canes.  Walkers.  Scooters.  Crutches.  Turn on the lights when you go into a dark area. Replace any light bulbs as soon as they burn out.  Set up your furniture so you have a clear path. Avoid moving your furniture around.  If any of your floors are uneven, fix them.  If there are any pets around you, be aware of where they are.  Review your medicines with your doctor. Some medicines can make you feel dizzy. This can increase your chance of falling. Ask your doctor what other things that you can do to help prevent falls. This information is not intended to replace advice given to you by your health care provider. Make sure you discuss any questions you have with your health care provider. Document Released: 01/07/2009 Document Revised: 08/19/2015 Document Reviewed: 04/17/2014 Elsevier  Interactive Patient Education  2017 [employer].

## 2020-05-27 NOTE — Progress Notes (Signed)
Subjective:   Danielle BeckmannBobbie J Park is a 70 y.o. female who presents for Medicare Annual (Subsequent) preventive examination.  Review of Systems    No ROS.  Medicare Wellness Virtual Visit.    Cardiac Risk Factors include: advanced age (>5755men, 21>65 women)     Objective:    Today's Vitals   05/27/20 1302  Weight: 188 lb (85.3 kg)  Height: 5\' 3"  (1.6 m)   Body mass index is 33.3 kg/m.  Advanced Directives 05/27/2020 09/26/2018 08/21/2017 01/12/2017 07/13/2016 12/31/2015 01/11/2015  Does Patient Have a Medical Advance Directive? No No No No Yes No No  Type of Advance Directive - - - - Healthcare Power of Attorney - -  Would patient like information on creating a medical advance directive? No - Patient declined No - Patient declined No - Patient declined - - - No - patient declined information    Current Medications (verified) Outpatient Encounter Medications as of 05/27/2020  Medication Sig  . amLODipine (NORVASC) 10 MG tablet TAKE 1 TABLET BY MOUTH EVERY DAY  . aspirin 81 MG tablet Take 81 mg by mouth daily.  . cholecalciferol (VITAMIN D3) 25 MCG (1000 UT) tablet Take 2,000 Units by mouth daily.  . clopidogrel (PLAVIX) 75 MG tablet TAKE 1 TABLET (75 MG TOTAL) BY MOUTH DAILY.  . Dulaglutide (TRULICITY) 3 MG/0.5ML SOPN Inject 3 mg into the skin once a week.  . empagliflozin (JARDIANCE) 25 MG TABS tablet Take 25 mg by mouth daily before breakfast.  . furosemide (LASIX) 20 MG tablet TAKE 1 TABLET BY MOUTH EVERY DAY  . glipiZIDE (GLUCOTROL) 5 MG tablet TAKE 1 TABLET (5 MG TOTAL) BY MOUTH 2 (TWO) TIMES DAILY BEFORE A MEAL. (Patient not taking: Reported on 05/18/2020)  . hydrALAZINE (APRESOLINE) 25 MG tablet TAKE 1 TABLET BY MOUTH THREE TIMES A DAY  . Insulin Glargine (BASAGLAR KWIKPEN) 100 UNIT/ML Inject 22 Units into the skin daily.  . Insulin Pen Needle (NOVOFINE PLUS PEN NEEDLE) 32G X 4 MM MISC 1 each by Does not apply route as directed.  Marland Kitchen. levothyroxine (SYNTHROID) 75 MCG tablet TAKE ONE  TABLET BY MOUTH ONCE DAILY BEFORE BREAKFAST  . lisinopril (ZESTRIL) 40 MG tablet TAKE 1 TABLET BY MOUTH EVERY DAY  . metoprolol tartrate (LOPRESSOR) 25 MG tablet TAKE 1 TABLET BY MOUTH TWICE A DAY  . rosuvastatin (CRESTOR) 10 MG tablet TAKE 1 TABLET BY MOUTH EVERY DAY   No facility-administered encounter medications on file as of 05/27/2020.    Allergies (verified) Patient has no known allergies.   History: Past Medical History:  Diagnosis Date  . Chicken pox   . 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 Congenital single kidney   . Diabetes mellitus   . Hypertension   . UTI (urinary tract infection)    Past Surgical History:  Procedure Laterality Date  . CESAREAN SECTION    . PERIPHERAL VASCULAR CATHETERIZATION N/A 01/11/2015   Procedure: Abdominal Aortogram w/Lower Extremity;  Surgeon: Annice NeedyJason S Dew, MD;  Location: [facility] INVASIVE CV LAB;  Service: Cardiovascular;  Laterality: N/A;  . PERIPHERAL VASCULAR CATHETERIZATION  01/11/2015   Procedure: Lower Extremity Intervention;  Surgeon: Annice NeedyJason S Dew, MD;  Location: [facility] INVASIVE CV LAB;  Service: Cardiovascular;;  . RENAL ARTERY STENT  2014   [facility] Wyn Quakerew [facility]  . VAGINAL DELIVERY     Family History  Problem Relation Age of Onset  . Hypertension Mother   . Diabetes Mother   .  Heart disease Mother        s/p CABG x 2  . Hypertension Father   . Cancer Father        lung   . Diabetes Sister   . Heart disease Sister   . Heart disease Sister   . Cancer - Lung Brother   . Cancer Sister        lung  . Heart disease Other   . Diabetes Other    Social History   Socioeconomic History  . Marital status: Married    Spouse name: Not on file  . Number of children: Not on file  . Years of education: Not on file  . Highest education level: Not on file  Occupational History  . Not on file  Tobacco Use  . Smoking status: Never Smoker  . Smokeless tobacco: Never Used  Substance and Sexual  Activity  . Alcohol use: No    Alcohol/week: 0.0 standard drinks  . Drug use: No  . Sexual activity: Not on file  Other Topics Concern  . Not on file  Social History Narrative   Lives in [place] with husband. 1 dog      Work - Administrator, Civil Service   Diet - regular diet   Exercise - occasional, very active at work   International aid/development worker of Corporate investment banker Strain: Medium Risk  . Difficulty of Paying Living Expenses: Somewhat hard  Food Insecurity: No Food Insecurity  . Worried About Programme researcher, broadcasting/film/video in the Last Year: Never true  . Ran Out of Food in the Last Year: Never true  Transportation Needs: No Transportation Needs  . Lack of Transportation (Medical): No  . Lack of Transportation (Non-Medical): No  Physical Activity: Sufficiently Active  . Days of Exercise per Week: 7 days  . Minutes of Exercise per Session: 30 min  Stress: Stress Concern Present  . Feeling of Stress : Rather much  Social Connections: Unknown  . Frequency of Communication with Friends and Family: More than three times a week  . Frequency of Social Gatherings with Friends and Family: Not on file  . Attends Religious Services: Not on file  . Active Member of Clubs or Organizations: Not on file  . Attends Banker Meetings: Not on file  . Marital Status: Not on file    Tobacco Counseling Counseling given: Not Answered   Clinical Intake:  Pre-visit preparation completed: Yes        Diabetes: Yes (Followed by pcp)  How often do you need to have someone help you when you read instructions, pamphlets, or other written materials from your doctor or pharmacy?: 1 - Never Nutrition Risk Assessment: Has the patient had any N/V/D within the last 2 weeks?  No  Does the patient have any non-healing wounds?  No  Has the patient had any unintentional weight loss or weight gain?  Yes   Diabetes: If diabetic, was a CBG obtained today?  No  Did the patient bring in their glucometer from  home?  No  How often do you monitor your CBG's? Every other day.  Diabetes Management: Is the patient seen by Chronic Care Management for management of their diabetes?  Yes    Interpreter Needed?: No      Activities of Daily Living In your present state of health, do you have any difficulty performing the following activities: 05/27/2020  Hearing? N  Vision? N  Difficulty concentrating or making decisions? N  Walking or  climbing stairs? N  Dressing or bathing? N  Doing errands, shopping? N  Preparing Food and eating ? N  Using the Toilet? N  In the past six months, have you accidently leaked urine? N  Do you have problems with loss of bowel control? N  Managing your Medications? N  Managing your Finances? N  Housekeeping or managing your Housekeeping? N  Some recent data might be hidden    Patient Care Team: Glori Luis, MD as PCP - General (Family Medicine) Lourena Simmonds, RPH-CPP as Pharmacist (Pharmacist)  Indicate any recent Medical Services you may have received from other than [facility] providers in the past year (date may be approximate).     Assessment:   This is a routine wellness examination for Danielle Park.  I connected with Danielle Park today by telephone and verified that I am speaking with the correct person using two identifiers. Location patient: home Location provider: work Persons participating in the virtual visit: patient, Engineer, civil (consulting).    I discussed the limitations, risks, security and privacy concerns of performing an evaluation and management service by telephone and the availability of in person appointments. The patient expressed understanding and verbally consented to this telephonic visit.    Interactive audio and video telecommunications were attempted between this provider and patient, however failed, due to patient having technical difficulties OR patient did not have access to video capability.  We continued and completed visit with audio  only.  Hearing/Vision screen  Hearing Screening   125Hz  250Hz  500Hz  1000Hz  2000Hz  3000Hz  4000Hz  6000Hz  8000Hz   Right ear:           Left ear:           Comments: Patient is able to hear conversational tones without difficulty.  No issues reported.  Vision Screening Comments: Visual acuity not assessed, virtual visit. They have seen their ophthalmologist in the last 12 months.   Dietary issues and exercise activities discussed: Current Exercise Habits: Home exercise routine, Type of exercise: calisthenics;strength training/weights, Intensity: Mild  Healthy diet Good water intake  Goals: Schedule diabetic eye exam.  Depression Screen PHQ 2/9 Scores 05/27/2020 01/06/2020 11/04/2019 05/16/2019 12/16/2018 09/26/2018 04/05/2018  PHQ - 2 Score 0 0 0 0 0 0 0    Fall Risk Fall Risk  05/27/2020 03/29/2020 11/04/2019 08/15/2019 05/16/2019  Falls in the past year? 0 0 0 0 0  Number falls in past yr: 0 0 0 0 0  Injury with Fall? 0 0 - - -  Follow up Falls evaluation completed Falls evaluation completed Falls evaluation completed Falls evaluation completed Falls evaluation completed    FALL RISK PREVENTION PERTAINING TO THE HOME: Handrails in use when climbing stairs? Yes Home free of loose throw rugs in walkways, pet beds, electrical cords, etc? Yes  Adequate lighting in your home to reduce risk of falls? Yes   ASSISTIVE DEVICES UTILIZED TO PREVENT FALLS: Life alert? No  Use of a cane, walker or w/c? No   TIMED UP AND GO: Was the test performed? No . Virtual visit.   Cognitive Function:  Patient is alert and oriented x3.  Denies difficulty focusing, making decisions, memory loss.  Enjoys crocheting, reading and knitting for brain health.  MMSE/6CIT deferred. Normal by direct communication/observation.    6CIT Screen 09/26/2018  What Year? 0 points  What month? 0 points  What time? 0 points  Count back from 20 0 points  Months in reverse 0 points  Repeat phrase 0 points  Total Score 0  Immunizations Immunization History  Administered Date(s) Administered  . Influenza, High Dose Seasonal PF 01/29/2017, 12/09/2017, 01/01/2020  . Influenza,inj,Quad PF,6+ Mos 01/15/2019  . Influenza-Unspecified 12/08/2015  . PFIZER(Purple Top)SARS-COV-2 Vaccination 06/26/2019, 07/23/2019, 03/06/2020  . Pneumococcal Conjugate-13 12/30/2017  . Pneumococcal Polysaccharide-23 01/15/2019  . Tdap 12/30/2017    Health Maintenance Health Maintenance  Topic Date Due  . OPHTHALMOLOGY EXAM  02/20/2017  . MAMMOGRAM  08/14/2020 (Originally 09/12/2013)  . COLONOSCOPY (Pts 45-37yrs Insurance coverage will need to be confirmed)  08/14/2020 (Originally 02/07/2019)  . FOOT EXAM  08/14/2020  . HEMOGLOBIN A1C  09/26/2020  . TETANUS/TDAP  12/31/2027  . INFLUENZA VACCINE  Completed  . COVID-19 Vaccine  Completed  . Hepatitis C Screening  Completed  . PNA vac Low Risk Adult  Completed  . HPV VACCINES  Aged Out  . DEXA SCAN  Discontinued   Colorectal cancer screening: Type of screening: Colonoscopy. Completed 02/06/14. Repeat every 5 years. Previously postponed for 08/14/20.  Mammogram status: Completed 09/13/11. Repeat every year. Previously postponed for 08/14/20.  Bone density- discontinued per patient preference.   Eye exam- plans to schedule annual exam.   Lung Cancer Screening: (Low Dose CT Chest recommended if Age 66-80 years, 30 pack-year currently smoking OR have quit w/in 15years.) does not qualify.   Dental Screening: Recommended annual dental exams for proper oral hygiene.  Community Resource Referral / Chronic Care Management: CRR required this visit?  No   CCM required this visit?  No      Plan:   Keep all routine maintenance appointments.   CCM 07/12/20 @ 1:30  Follow up 07/27/20 @ 11:00  I have personally reviewed and noted the following in the patient's chart:   . Medical and social history . Use of alcohol, tobacco or illicit drugs  . Current medications and  supplements . Functional ability and status . Nutritional status . Physical activity . Advanced directives . List of other physicians . Hospitalizations, surgeries, and ER visits in previous 12 months . Vitals . Screenings to include cognitive, depression, and falls . Referrals and appointments  In addition, I have reviewed and discussed with patient certain preventive protocols, quality metrics, and best practice recommendations. A written personalized care plan for preventive services as well as general preventive health recommendations were provided to patient via mychart.     Ashok Pall, LPN   11/30/930

## 2020-06-09 ENCOUNTER — Other Ambulatory Visit: Payer: Self-pay | Admitting: Family Medicine

## 2020-06-30 ENCOUNTER — Other Ambulatory Visit: Payer: Self-pay | Admitting: Family Medicine

## 2020-07-07 ENCOUNTER — Other Ambulatory Visit: Payer: Self-pay | Admitting: Family Medicine

## 2020-07-12 ENCOUNTER — Ambulatory Visit (INDEPENDENT_AMBULATORY_CARE_PROVIDER_SITE_OTHER): Payer: PPO | Admitting: Pharmacist

## 2020-07-12 DIAGNOSIS — N1832 Chronic kidney disease, stage 3b: Secondary | ICD-10-CM

## 2020-07-12 DIAGNOSIS — I150 Renovascular hypertension: Secondary | ICD-10-CM | POA: Diagnosis not present

## 2020-07-12 DIAGNOSIS — E785 Hyperlipidemia, unspecified: Secondary | ICD-10-CM

## 2020-07-12 DIAGNOSIS — Z794 Long term (current) use of insulin: Secondary | ICD-10-CM | POA: Diagnosis not present

## 2020-07-12 DIAGNOSIS — E1122 Type 2 diabetes mellitus with diabetic chronic kidney disease: Secondary | ICD-10-CM

## 2020-07-12 DIAGNOSIS — N1831 Chronic kidney disease, stage 3a: Secondary | ICD-10-CM

## 2020-07-12 DIAGNOSIS — I739 Peripheral vascular disease, unspecified: Secondary | ICD-10-CM

## 2020-07-12 MED ORDER — TRULICITY 4.5 MG/0.5ML ~~LOC~~ SOAJ: 4.5000 mg | SUBCUTANEOUS | 2 refills | Status: DC

## 2020-07-12 NOTE — Chronic Care Management (AMB) (Signed)
   07/12/2020  Danielle Park 04-08-1950 [id number]  Updating chart to reflect patient starting Trulicity 4.5 mg weekly today. Order sent to [facility] for refill purposes. Patient to f/u with PCP in 2 weeks.   [name], PharmD, [place], CPP Clinical Pharmacist [employer] at [facility] [facility] [phone]

## 2020-07-12 NOTE — Addendum Note (Signed)
Addended by: Lourena Simmonds on: 07/12/2020 02:54 PM   Modules accepted: Orders
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
## 2020-07-12 NOTE — Patient Instructions (Signed)
  Visit Information  PATIENT GOALS: Goals Addressed              This Visit's Progress   .  Medication Monitoring (pt-stated)        Patient Goals/Self-Care Activities . Over the next 90 days, patient will:  - take medications as prescribed check blood glucose BID, document, and provide at future appointments collaborate with provider on medication access solutions.       Patient verbalizes understanding of instructions provided today and agrees to view in MyChart.   Plan: Telephone follow up appointment with care management team member scheduled for:  ~10 weeks  Fabio Neighbors, PharmD, BCPS PGY2 Ambulatory Care Resident [facility]  Pharmacy

## 2020-07-21 ENCOUNTER — Other Ambulatory Visit: Payer: Self-pay

## 2020-07-27 ENCOUNTER — Other Ambulatory Visit: Payer: Self-pay

## 2020-07-27 ENCOUNTER — Encounter: Payer: Self-pay | Admitting: Family Medicine

## 2020-07-27 ENCOUNTER — Ambulatory Visit (INDEPENDENT_AMBULATORY_CARE_PROVIDER_SITE_OTHER): Payer: PPO | Admitting: Family Medicine

## 2020-07-27 DIAGNOSIS — I150 Renovascular hypertension: Secondary | ICD-10-CM

## 2020-07-27 DIAGNOSIS — E039 Hypothyroidism, unspecified: Secondary | ICD-10-CM

## 2020-07-27 DIAGNOSIS — G8929 Other chronic pain: Secondary | ICD-10-CM

## 2020-07-27 DIAGNOSIS — E785 Hyperlipidemia, unspecified: Secondary | ICD-10-CM

## 2020-07-27 DIAGNOSIS — N1831 Chronic kidney disease, stage 3a: Secondary | ICD-10-CM

## 2020-07-27 DIAGNOSIS — M546 Pain in thoracic spine: Secondary | ICD-10-CM

## 2020-07-27 DIAGNOSIS — E1122 Type 2 diabetes mellitus with diabetic chronic kidney disease: Secondary | ICD-10-CM | POA: Diagnosis not present

## 2020-07-27 DIAGNOSIS — Z794 Long term (current) use of insulin: Secondary | ICD-10-CM | POA: Diagnosis not present

## 2020-07-27 LAB — COMPREHENSIVE METABOLIC PANEL
ALT: 6 U/L (ref 0–35)
AST: 15 U/L (ref 0–37)
Albumin: 4.2 g/dL (ref 3.5–5.2)
Alkaline Phosphatase: 89 U/L (ref 39–117)
BUN: 25 mg/dL — ABNORMAL HIGH (ref 6–23)
CO2: 24 mEq/L (ref 19–32)
Calcium: 9.6 mg/dL (ref 8.4–10.5)
Chloride: 104 mEq/L (ref 96–112)
Creatinine, Ser: 1.41 mg/dL — ABNORMAL HIGH (ref 0.40–1.20)
GFR: 38.05 mL/min — ABNORMAL LOW (ref >60.00)
Glucose, Bld: 116 mg/dL — ABNORMAL HIGH (ref 70–99)
Potassium: 4.4 mEq/L (ref 3.5–5.1)
Sodium: 136 mEq/L (ref 135–145)
Total Bilirubin: 0.5 mg/dL (ref 0.2–1.2)
Total Protein: 7.2 g/dL (ref 6.0–8.3)

## 2020-07-27 LAB — LIPID PANEL
Cholesterol: 129 mg/dL (ref 0–200)
HDL: 51.7 mg/dL (ref >39.00)
LDL Cholesterol: 61 mg/dL (ref 0–99)
NonHDL: 77.59
Total CHOL/HDL Ratio: 3
Triglycerides: 83 mg/dL (ref 0.0–149.0)
VLDL: 16.6 mg/dL (ref 0.0–40.0)

## 2020-07-27 LAB — HEMOGLOBIN A1C: Hgb A1c MFr Bld: 5.9 % (ref 4.6–6.5)

## 2020-07-27 MED ORDER — BACLOFEN 10 MG PO TABS: 5.0000 mg | ORAL_TABLET | Freq: Three times a day (TID) | ORAL | 0 refills | Status: DC | PRN

## 2020-07-27 NOTE — Assessment & Plan Note (Signed)
Well-controlled.  She will continue amlodipine 10 mg once daily, Lasix 20 mg daily, hydralazine 25 mg 3 times daily, lisinopril 40 mg daily, and metoprolol 25 mg twice daily.  Check CMP.

## 2020-07-27 NOTE — Assessment & Plan Note (Signed)
Continue Synthroid 75 mcg daily.  Most recent TSH acceptable.

## 2020-07-27 NOTE — Assessment & Plan Note (Addendum)
Chronic issue.  Suspect musculoskeletal cause.  Discussed the option for physical therapy versus home exercises.  She declines these at this time noting she will let us know when she would like to do physical therapy. Baclofen sent in as a muscle relaxer.  She did report taking an old prescription of narcotics that her husband had on hand for the discomfort.  I advised she should not do that.  I also discussed that she should not mix the muscle relaxer with any other kinds of pain medication.

## 2020-07-27 NOTE — Assessment & Plan Note (Signed)
Check A1c.  Continue Trulicity 4.5 mg weekly, Jardiance 25 mg daily, and Basaglar 22 units daily.

## 2020-07-27 NOTE — Assessment & Plan Note (Signed)
>>  ASSESSMENT AND PLAN FOR CONTROLLED DIABETES MELLITUS TYPE 2 WITH COMPLICATIONS (HCC) WRITTEN ON 07/27/2020 11:20 AM BY SONNENBERG, ERIC G, MD  Check A1c.  Continue Trulicity  4.5 mg weekly, Jardiance  25 mg daily, and Basaglar  22 units daily.

## 2020-07-27 NOTE — Patient Instructions (Signed)
Nice to see you. Please let me know when you would like to do physical therapy. Will contact you with your lab results.

## 2020-07-27 NOTE — Progress Notes (Signed)
Eric Sonnenberg, MD Phone: [phone]  Danielle Park is a 69 y.o. female who presents today for f/u.  HYPERTENSION  Disease Monitoring  Home BP Monitoring 105/50-60 Chest pain- no    Dyspnea- no Medications  Compliance-  Taking amlodipine, hydralazine, lisinopril, metoprolol, Lasix.   Edema- stable  HYPOTHYROIDISM Disease Monitoring Weight changes: yes  Skin Changes: no Heat/Cold intolerance: no  Medication Monitoring Compliance:  Taking synthroid   Last TSH:   Lab Results  Component Value Date   TSH 2.58 12/23/2019   DIABETES Disease Monitoring: Blood Sugar ranges-90s fasting Polyuria/phagia/dipsia- no      Optho- due Medications: Compliance- taking trulicity, jardiance, basaglar Hypoglycemic symptoms- no  HYPERLIPIDEMIA Symptoms Chest pain on exertion:  no   Medications: Compliance- taking crestor Right upper quadrant pain- no  Muscle aches- no  Mid back discomfort: This has been an ongoing issue.  She notes it gets worse if she moves her arms a lot in a certain way.  She notes no radiation, numbness, weakness, or incontinence.  Social History   Tobacco Use  Smoking Status Never Smoker  Smokeless Tobacco Never Used    Current Outpatient Medications on File Prior to Visit  Medication Sig Dispense Refill  . amLODipine (NORVASC) 10 MG tablet TAKE 1 TABLET BY MOUTH EVERY DAY 90 tablet 1  . aspirin 81 MG tablet Take 81 mg by mouth daily.    . cholecalciferol (VITAMIN D3) 25 MCG (1000 UT) tablet Take 2,000 Units by mouth daily.    . clopidogrel (PLAVIX) 75 MG tablet TAKE 1 TABLET (75 MG TOTAL) BY MOUTH DAILY. 90 tablet 2  . Dulaglutide (TRULICITY) 4.5 MG/0.5ML SOPN Inject 4.5 mg as directed once a week. 12 mL 2  . empagliflozin (JARDIANCE) 25 MG TABS tablet Take 25 mg by mouth daily before breakfast. 90 tablet 3  . furosemide (LASIX) 20 MG tablet TAKE 1 TABLET BY MOUTH EVERY DAY 90 tablet 1  . hydrALAZINE (APRESOLINE) 25 MG tablet TAKE 1 TABLET BY MOUTH THREE  TIMES A DAY 270 tablet 1  . Insulin Glargine (BASAGLAR KWIKPEN) 100 UNIT/ML Inject 22 Units into the skin daily. 15 mL 1  . Insulin Pen Needle (NOVOFINE PLUS PEN NEEDLE) 32G X 4 MM MISC 1 each by Does not apply route as directed. 90 each 3  . levothyroxine (SYNTHROID) 75 MCG tablet TAKE ONE TABLET BY MOUTH ONCE DAILY BEFORE BREAKFAST 90 tablet 1  . lisinopril (ZESTRIL) 40 MG tablet TAKE 1 TABLET BY MOUTH EVERY DAY 90 tablet 1  . metoprolol tartrate (LOPRESSOR) 25 MG tablet TAKE 1 TABLET BY MOUTH TWICE A DAY 180 tablet 1  . rosuvastatin (CRESTOR) 10 MG tablet TAKE 1 TABLET BY MOUTH EVERY DAY 90 tablet 1  . glipiZIDE (GLUCOTROL) 5 MG tablet TAKE 1 TABLET (5 MG TOTAL) BY MOUTH 2 (TWO) TIMES DAILY BEFORE A MEAL. (Patient not taking: Reported on 07/27/2020) 180 tablet 1   No current facility-administered medications on file prior to visit.     ROS see history of present illness  Objective  Physical Exam Vitals:   07/27/20 1105  BP: 120/70  Pulse: 64  Temp: 97.9 F (36.6 C)  SpO2: 97%    BP Readings from Last 3 Encounters:  07/27/20 120/70  03/29/20 124/72  11/04/19 (!) 150/70   Wt Readings from Last 3 Encounters:  07/27/20 174 lb 9.6 oz (79.2 kg)  05/27/20 188 lb (85.3 kg)  03/29/20 188 lb 3.2 oz (85.4 kg)    Physical Exam Constitutional:        General: She is not in acute distress.    Appearance: She is not diaphoretic.  Cardiovascular:     Rate and Rhythm: Normal rate and regular rhythm.     Heart sounds: Normal heart sounds.  Pulmonary:     Effort: Pulmonary effort is normal.     Breath sounds: Normal breath sounds.  Musculoskeletal:     Right lower leg: No edema.     Left lower leg: No edema.  Skin:    General: Skin is warm and dry.  Neurological:     Mental Status: She is alert.      Assessment/Plan: Please see individual problem list.  Problem List Items Addressed This Visit    Diabetes (HCC) (Chronic)    Check A1c.  Continue Trulicity 4.5 mg weekly,  Jardiance 25 mg daily, and Basaglar 22 units daily.      Relevant Orders   HgB A1c   Hypothyroidism    Continue Synthroid 75 mcg daily.  Most recent TSH acceptable.      Renovascular hypertension    Well-controlled.  She will continue amlodipine 10 mg once daily, Lasix 20 mg daily, hydralazine 25 mg 3 times daily, lisinopril 40 mg daily, and metoprolol 25 mg twice daily.  Check CMP.      Relevant Orders   Comp Met (CMET)   Lipid panel   Thoracic back pain    Chronic issue.  Suspect musculoskeletal cause.  Discussed the option for physical therapy versus home exercises.  She declines these at this time noting she will let us know when she would like to do physical therapy. Baclofen sent in as a muscle relaxer.  She did report taking an old prescription of narcotics that her husband had on hand for the discomfort.  I advised she should not do that.  I also discussed that she should not mix the muscle relaxer with any other kinds of pain medication.      Relevant Medications   baclofen (LIORESAL) 10 MG tablet   Hyperlipidemia    Check lipid panel.  Continue Crestor 10 mg daily.      Relevant Orders   Lipid panel       Health Maintenance: declines mammogram.  She also defers colonoscopy at this time.  She understands the risk of missing significant lesions with deferring these things.    This visit occurred during the SARS-CoV-2 public health emergency.  Safety protocols were in place, including screening questions prior to the visit, additional usage of staff PPE, and extensive cleaning of exam room while observing appropriate contact time as indicated for disinfecting solutions.    Eric Sonnenberg, MD [facility] [place]  

## 2020-07-27 NOTE — Assessment & Plan Note (Signed)
Check lipid panel.  Continue Crestor 10 mg daily. 

## 2020-08-08 ENCOUNTER — Other Ambulatory Visit: Payer: Self-pay | Admitting: Family Medicine

## 2020-09-20 ENCOUNTER — Ambulatory Visit (INDEPENDENT_AMBULATORY_CARE_PROVIDER_SITE_OTHER): Payer: PPO | Admitting: Pharmacist

## 2020-09-20 DIAGNOSIS — E785 Hyperlipidemia, unspecified: Secondary | ICD-10-CM | POA: Diagnosis not present

## 2020-09-20 DIAGNOSIS — E1122 Type 2 diabetes mellitus with diabetic chronic kidney disease: Secondary | ICD-10-CM

## 2020-09-20 DIAGNOSIS — N1831 Chronic kidney disease, stage 3a: Secondary | ICD-10-CM

## 2020-09-20 DIAGNOSIS — I150 Renovascular hypertension: Secondary | ICD-10-CM

## 2020-09-20 DIAGNOSIS — Z794 Long term (current) use of insulin: Secondary | ICD-10-CM

## 2020-09-20 MED ORDER — BASAGLAR KWIKPEN 100 UNIT/ML ~~LOC~~ SOPN: 20.0000 [IU] | PEN_INJECTOR | Freq: Every day | SUBCUTANEOUS | 1 refills | Status: DC
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
## 2020-09-20 NOTE — Addendum Note (Signed)
Addended by: Vanice Sarah E on: 09/20/2020 02:00 PM   Modules accepted: Orders

## 2020-09-20 NOTE — Patient Instructions (Signed)
Visit Information  PATIENT GOALS:  Goals Addressed               This Visit's Progress     Medication Monitoring (pt-stated)        Patient Goals/Self-Care Activities Over the next 90 days, patient will:  - take medications as prescribed - check blood glucose BID, document, and provide at future appointments - collaborate with provider on medication access solutions.         Patient verbalizes understanding of instructions provided today and agrees to view in MyChart.   Telephone follow up appointment with care management team member scheduled for: ~4 months  Fabio Neighbors, PharmD, BCPS PGY2 Ambulatory Care Resident [facility]  Pharmacy

## 2020-10-13 ENCOUNTER — Other Ambulatory Visit: Payer: Self-pay | Admitting: Family Medicine

## 2020-10-18 ENCOUNTER — Other Ambulatory Visit: Payer: Self-pay | Admitting: Family Medicine

## 2020-10-18 DIAGNOSIS — Z794 Long term (current) use of insulin: Secondary | ICD-10-CM

## 2020-10-18 DIAGNOSIS — E1122 Type 2 diabetes mellitus with diabetic chronic kidney disease: Secondary | ICD-10-CM

## 2020-10-19 ENCOUNTER — Ambulatory Visit (INDEPENDENT_AMBULATORY_CARE_PROVIDER_SITE_OTHER): Payer: PPO

## 2020-10-19 ENCOUNTER — Other Ambulatory Visit: Payer: Self-pay

## 2020-10-19 ENCOUNTER — Ambulatory Visit (INDEPENDENT_AMBULATORY_CARE_PROVIDER_SITE_OTHER): Payer: PPO | Admitting: Vascular Surgery

## 2020-10-19 VITALS — BP 134/65 | HR 60 | Ht 63.0 in | Wt 175.0 lb

## 2020-10-19 DIAGNOSIS — I739 Peripheral vascular disease, unspecified: Secondary | ICD-10-CM

## 2020-10-19 DIAGNOSIS — Z794 Long term (current) use of insulin: Secondary | ICD-10-CM

## 2020-10-19 DIAGNOSIS — N1831 Chronic kidney disease, stage 3a: Secondary | ICD-10-CM

## 2020-10-19 DIAGNOSIS — I701 Atherosclerosis of renal artery: Secondary | ICD-10-CM | POA: Diagnosis not present

## 2020-10-19 DIAGNOSIS — E1122 Type 2 diabetes mellitus with diabetic chronic kidney disease: Secondary | ICD-10-CM | POA: Diagnosis not present

## 2020-10-19 DIAGNOSIS — N1832 Chronic kidney disease, stage 3b: Secondary | ICD-10-CM | POA: Diagnosis not present

## 2020-10-19 DIAGNOSIS — I150 Renovascular hypertension: Secondary | ICD-10-CM | POA: Diagnosis not present

## 2020-10-19 NOTE — Assessment & Plan Note (Signed)
ABIs today were 0.5 bilaterally with pretty good waveforms.  Perfusion is well-maintained at this time.  Recheck in 1 year.  No change in medical regimen.

## 2020-10-19 NOTE — Assessment & Plan Note (Signed)
Has not been checked in about a year.  We will follow-up with a duplex in the near future.

## 2020-10-19 NOTE — Progress Notes (Signed)
MRN : [id number]  Danielle Park is a 70 y.o. ([birth year]) female who presents with chief complaint of  Chief Complaint  Patient presents with   Follow-up    1 yr [place]  .  History of Present Illness: Patient returns today in follow up of her PAD.  Her legs are doing well.  She not currently having lifestyle limiting claudication, ischemic rest pain, or ulceration.  ABIs today are 0.95 bilaterally with pretty good waveforms.  Her blood pressure is still been running pretty good as well.  She has a history of renal artery intervention for a solitary kidney.  She was last checked with duplex about a year ago.  Current Outpatient Medications  Medication Sig Dispense Refill   amLODipine (NORVASC) 10 MG tablet TAKE 1 TABLET BY MOUTH EVERY DAY 90 tablet 1   aspirin 81 MG tablet Take 81 mg by mouth daily.     baclofen (LIORESAL) 10 MG tablet Take 0.5 tablets (5 mg total) by mouth 3 (three) times daily as needed for muscle spasms. 10 each 0   cholecalciferol (VITAMIN D3) 25 MCG (1000 UT) tablet Take 2,000 Units by mouth daily.     clopidogrel (PLAVIX) 75 MG tablet TAKE 1 TABLET (75 MG TOTAL) BY MOUTH DAILY. 90 tablet 2   Dulaglutide (TRULICITY) 4.5 BH/4.1PF SOPN Inject 4.5 mg as directed once a week. 12 mL 2   empagliflozin (JARDIANCE) 25 MG TABS tablet Take 25 mg by mouth daily before breakfast. 90 tablet 3   furosemide (LASIX) 20 MG tablet TAKE 1 TABLET BY MOUTH EVERY DAY 90 tablet 1   hydrALAZINE (APRESOLINE) 25 MG tablet TAKE 1 TABLET BY MOUTH THREE TIMES A DAY 270 tablet 1   Insulin Glargine (BASAGLAR KWIKPEN) 100 UNIT/ML Inject 20 Units into the skin daily. 15 mL 1   Insulin Pen Needle (NOVOFINE PLUS PEN NEEDLE) 32G X 4 MM MISC 1 each by Does not apply route as directed. 90 each 3   levothyroxine (SYNTHROID) 75 MCG tablet TAKE ONE TABLET BY MOUTH ONCE DAILY BEFORE BREAKFAST 90 tablet 1   lisinopril (ZESTRIL) 40 MG tablet TAKE 1 TABLET BY MOUTH EVERY DAY 90 tablet 1   metoprolol tartrate  (LOPRESSOR) 25 MG tablet TAKE 1 TABLET BY MOUTH TWICE A DAY 180 tablet 1   rosuvastatin (CRESTOR) 10 MG tablet TAKE 1 TABLET BY MOUTH EVERY DAY 90 tablet 1   No current facility-administered medications for this visit.    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   Congenital single kidney    Diabetes mellitus    Hypertension    UTI (urinary tract infection)     Past Surgical History:  Procedure Laterality Date   CESAREAN SECTION     PERIPHERAL VASCULAR CATHETERIZATION N/A 01/11/2015   Procedure: Abdominal Aortogram w/Lower Extremity;  Surgeon: Algernon Huxley, MD;  Location: [place] [facility];  Service: Cardiovascular;  Laterality: N/A;   PERIPHERAL VASCULAR CATHETERIZATION  01/11/2015   Procedure: Lower Extremity Intervention;  Surgeon: Algernon Huxley, MD;  Location: [place];  Service: Cardiovascular;;   RENAL ARTERY STENT  2014   Dr. Lucky Cowboy at [facility]   VAGINAL DELIVERY       Social History   Tobacco Use   Smoking status: Never   Smokeless tobacco: Never  Substance Use Topics   Alcohol use: No  Alcohol/week: 0.0 standard drinks   Drug use: No      Family History  Problem Relation Age of Onset   Hypertension Mother    Diabetes Mother    Heart disease Mother        s/p CABG x 2   Hypertension Father    Cancer Father        lung    Diabetes Sister    Heart disease Sister    Heart disease Sister    Cancer - Lung Brother    Cancer Sister        lung   Heart disease Other    Diabetes Other      No Known Allergies    REVIEW OF SYSTEMS (Negative unless checked)   Constitutional: []Weight loss  []Fever  []Chills Cardiac: []Chest pain   []Chest pressure   []Palpitations   []Shortness of breath when laying flat   []Shortness of breath at rest   []Shortness of breath with exertion. Vascular:  [x]Pain in legs with walking   []Pain in legs at rest   []Pain in  legs when laying flat   [x]Claudication   []Pain in feet when walking  []Pain in feet at rest  []Pain in feet when laying flat   []History of DVT   []Phlebitis   []Swelling in legs   []Varicose veins   []Non-healing ulcers Pulmonary:   []Uses home oxygen   []Productive cough   []Hemoptysis   []Wheeze  []COPD   []Asthma Neurologic:  []Dizziness  []Blackouts   []Seizures   []History of stroke   []History of TIA  []Aphasia   []Temporary blindness   []Dysphagia   []Weakness or numbness in arms   []Weakness or numbness in legs Musculoskeletal:  [x]Arthritis   []Joint swelling   []Joint pain   []Low back pain Hematologic:  []Easy bruising  []Easy bleeding   []Hypercoagulable state   []Anemic   Gastrointestinal:  []Blood in stool   []Vomiting blood  []Gastroesophageal reflux/heartburn   []Abdominal pain Genitourinary:  [x]Chronic kidney disease   []Difficult urination  []Frequent urination  []Burning with urination   [x]Hematuria Skin:  []Rashes   []Ulcers   []Wounds Psychological:  []History of anxiety   [] History of major depression.  Physical Examination  BP 134/65   Pulse 60   Ht 5' 3" (1.6 m)   Wt 175 lb (79.4 kg)   BMI 31.00 kg/m  Gen:  WD/WN, NAD Head: [place]/AT, No temporalis wasting. Ear/Nose/Throat: Hearing grossly intact, nares w/o erythema or drainage Eyes: Conjunctiva clear. Sclera non-icteric Neck: Supple.  Trachea midline Pulmonary:  Good air movement, no use of accessory muscles.  Cardiac: RRR, no JVD Vascular:  Vessel Right Left  Radial Palpable Palpable                          PT Palpable Palpable  DP Palpable Palpable   Gastrointestinal: soft, non-tender/non-distended. No guarding/reflex.  Musculoskeletal: M/S 5/5 throughout.  No deformity or atrophy. No edema. Neurologic: Sensation grossly intact in extremities.  Symmetrical.  Speech is fluent.  Psychiatric: Judgment intact, Mood & affect appropriate for pt's clinical situation. Dermatologic: No rashes or ulcers  noted.  No cellulitis or open wounds.      Labs Recent Results (from the past 2160 hour(s))  Comp Met (CMET)     Status: Abnormal   Collection Time: 07/27/20 11:25 AM  Result Value Ref Range   Sodium 136 135 - 145 mEq/L  Potassium 4.4 3.5 - 5.1 mEq/L   Chloride 104 96 - 112 mEq/L   CO2 24 19 - 32 mEq/L   Glucose, Bld 116 (H) 70 - 99 mg/dL   BUN 25 (H) 6 - 23 mg/dL   Creatinine, Ser 1.41 (H) 0.40 - 1.20 mg/dL   Total Bilirubin 0.5 0.2 - 1.2 mg/dL   Alkaline Phosphatase 89 39 - 117 U/L   AST 15 0 - 37 U/L   ALT 6 0 - 35 U/L   Total Protein 7.2 6.0 - 8.3 g/dL   Albumin 4.2 3.5 - 5.2 g/dL   GFR 38.05 (L) >60.00 mL/min    Comment: Calculated using the CKD-EPI Creatinine Equation (2021)   Calcium 9.6 8.4 - 10.5 mg/dL  Lipid panel     Status: None   Collection Time: 07/27/20 11:25 AM  Result Value Ref Range   Cholesterol 129 0 - 200 mg/dL    Comment: ATP III Classification       Desirable:  < 200 mg/dL               Borderline High:  200 - 239 mg/dL          High:  > = 240 mg/dL   Triglycerides 83.0 0.0 - 149.0 mg/dL    Comment: Normal:  <150 mg/dLBorderline High:  150 - 199 mg/dL   HDL 51.70 >39.00 mg/dL   VLDL 16.6 0.0 - 40.0 mg/dL   LDL Cholesterol 61 0 - 99 mg/dL   Total CHOL/HDL Ratio 3     Comment:                Men          Women1/2 Average Risk     3.4          3.3Average Risk          5.0          4.42X Average Risk          9.6          7.13X Average Risk          15.0          11.0                       NonHDL 77.59     Comment: NOTE:  Non-HDL goal should be 30 mg/dL higher than patient's LDL goal (i.e. LDL goal of < 70 mg/dL, would have non-HDL goal of < 100 mg/dL)  HgB A1c     Status: None   Collection Time: 07/27/20 11:25 AM  Result Value Ref Range   Hgb A1c MFr Bld 5.9 4.6 - 6.5 %    Comment: Glycemic Control Guidelines for People with Diabetes:Non Diabetic:  <6%Goal of Therapy: <7%Additional Action Suggested:  >8%     Radiology No results  found.  Assessment/Plan Renovascular hypertension Blood pressure control significantly improved after intervention   Diabetes (HCC) blood glucose control important in reducing the progression of atherosclerotic disease. Also, involved in wound healing. On appropriate medications.     CKD (chronic kidney disease), stage III ([place]) Recently saw her nephrologist.  Last creatinine clearance was approximately 33.  Avoid dehydration.  Continue to monitor  Renal artery stenosis ([place]) Has not been checked in about a year.  We will follow-up with a duplex in the near future.  PVD (peripheral vascular disease) (HCC) ABIs today were 0.5 bilaterally with pretty good waveforms.  Perfusion is well-maintained  at this time.  Recheck in 1 year.  No change in medical regimen.    Leotis Pain, MD  10/19/2020 11:04 AM    This note was created with Dragon medical transcription system.  Any errors from dictation are purely unintentional

## 2020-11-05 LAB — HM DIABETES EYE EXAM

## 2020-11-08 ENCOUNTER — Telehealth: Payer: Self-pay | Admitting: Family Medicine

## 2020-11-08 NOTE — Telephone Encounter (Signed)
Noted.  We can fax the results to them.  I believe Dr. Brooke Dare is at [facility] eye.

## 2020-11-08 NOTE — Telephone Encounter (Signed)
I called and spoke with the patient and informed her of her retina scan results and she stated she would rather call her eye Doctor, Dr. Brooke Dare and see him instead of a referral, she stated she would tell him about her retinal scan results. Janda Cargo,cma

## 2020-11-08 NOTE — Telephone Encounter (Signed)
Received results from [facility] for Retinal Scan performed in office 11/05/20, Placed for your review and patient notification of abnormal scan to left eye. Please abstract once patient notified.

## 2020-11-08 NOTE — Telephone Encounter (Signed)
Please let the patient know that her retinal scan revealed mild diabetic retinopathy and also revealed macular edema in her left eye.  I would like to refer her to a retinal specialist for further evaluation.  I can place this referral after you speak with her.  Please abstract the results.

## 2020-11-10 NOTE — Telephone Encounter (Signed)
Results for diabetic eye exam was faxed to Dr. Brooke Dare of [place] [facility] and fax was confirmed.  Vanice Rappa,cma

## 2020-11-12 ENCOUNTER — Ambulatory Visit (INDEPENDENT_AMBULATORY_CARE_PROVIDER_SITE_OTHER): Payer: PPO | Admitting: Nurse Practitioner

## 2020-11-12 ENCOUNTER — Ambulatory Visit (INDEPENDENT_AMBULATORY_CARE_PROVIDER_SITE_OTHER): Payer: PPO

## 2020-11-12 ENCOUNTER — Other Ambulatory Visit: Payer: Self-pay

## 2020-11-12 VITALS — BP 115/69 | HR 67 | Ht 63.0 in | Wt 174.0 lb

## 2020-11-12 DIAGNOSIS — I701 Atherosclerosis of renal artery: Secondary | ICD-10-CM

## 2020-11-12 DIAGNOSIS — I739 Peripheral vascular disease, unspecified: Secondary | ICD-10-CM

## 2020-11-12 DIAGNOSIS — Z794 Long term (current) use of insulin: Secondary | ICD-10-CM | POA: Diagnosis not present

## 2020-11-12 DIAGNOSIS — E1122 Type 2 diabetes mellitus with diabetic chronic kidney disease: Secondary | ICD-10-CM

## 2020-11-12 DIAGNOSIS — I150 Renovascular hypertension: Secondary | ICD-10-CM | POA: Diagnosis not present

## 2020-11-12 DIAGNOSIS — N1831 Chronic kidney disease, stage 3a: Secondary | ICD-10-CM

## 2020-11-17 ENCOUNTER — Other Ambulatory Visit: Payer: Self-pay | Admitting: Family Medicine

## 2020-11-21 ENCOUNTER — Encounter (INDEPENDENT_AMBULATORY_CARE_PROVIDER_SITE_OTHER): Payer: Self-pay | Admitting: Nurse Practitioner

## 2020-11-21 NOTE — Progress Notes (Signed)
Subjective:    Patient ID: Danielle Park, female    DOB: 1951/03/22, 70 y.o.   MRN: [id number] Chief Complaint  Patient presents with  . Follow-up    Add on per 10/19/20 renal . See JD/FB    Danielle Park is a 70 year old female that returns today for follow-up studies related to her renal artery stenosis.  The patient has had previous intervention due to a solitary right kidney.  The patient denies any issues currently her blood pressure.  It is under good control.  The patient also has some decreased renal function.  She was previously seen for her ABIs but those are currently stable.  She denies any fevers or chills.  Today no evidence of renal artery stenosis noted.  Normal size right kidney.  No left kidney noted.  Review of Systems  Cardiovascular:  Negative for leg swelling.  All other systems reviewed and are negative.     Objective:   Physical Exam Vitals reviewed.  HENT:     Head: Normocephalic.  Cardiovascular:     Rate and Rhythm: Normal rate.     Pulses: Normal pulses.  Pulmonary:     Effort: Pulmonary effort is normal.  Neurological:     Mental Status: She is alert and oriented to person, place, and time.  Psychiatric:        Mood and Affect: Mood normal.        Behavior: Behavior normal.        Thought Content: Thought content normal.        Judgment: Judgment normal.    BP 115/69   Pulse 67   Ht 5\' 3"  (1.6 m)   Wt 174 lb (78.9 kg)   BMI 30.82 kg/m   Past Medical History:  Diagnosis Date  . Chicken pox   . 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 Congenital single kidney   . Diabetes mellitus   . Hypertension   . UTI (urinary tract infection)     Social History   Socioeconomic History  . Marital status: Married    Spouse name: Not on file  . Number of children: Not on file  . Years of education: Not on file  . Highest education level: Not on file  Occupational History  . Not on file  Tobacco Use  . Smoking status:  Never  . Smokeless tobacco: Never  Substance and Sexual Activity  . Alcohol use: No    Alcohol/week: 0.0 standard drinks  . Drug use: No  . Sexual activity: Not on file  Other Topics Concern  . Not on file  Social History Narrative   Lives in [place] with husband. 1 dog      Work - [employer]   Diet - regular diet   Exercise - occasional, very active at work   Administrator, [employer] aid/development worker Strain: Medium Risk  . Difficulty of Paying Living Expenses: Somewhat hard  Food Insecurity: No Food Insecurity  . Worried About Corporate investment banker in the Last Year: Never true  . Ran Out of Food in the Last Year: Never true  Transportation Needs: No Transportation Needs  . Lack of Transportation (Medical): No  . Lack of Transportation (Non-Medical): No  Physical Activity: Not on file  Stress: Stress Concern Present  . Feeling of Stress : Rather much  Social Connections: Unknown  . Frequency of Communication with Friends and Family: More than three  times a week  . Frequency of Social Gatherings with Friends and Family: Not on file  . Attends Religious Services: Not on file  . Active Member of Clubs or Organizations: Not on file  . Attends BankerClub or Organization Meetings: Not on file  . Marital Status: Not on file  Intimate Partner Violence: Not At Risk  . Fear of Current or Ex-Partner: No  . Emotionally Abused: No  . Physically Abused: No  . Sexually Abused: No    Past Surgical History:  Procedure Laterality Date  . CESAREAN SECTION    . PERIPHERAL VASCULAR CATHETERIZATION N/A 01/11/2015   Procedure: Abdominal Aortogram w/Lower Extremity;  Surgeon: Annice NeedyJason S Dew, MD;  Location: [facility] INVASIVE CV [facility];  Service: Cardiovascular;  Laterality: N/A;  . PERIPHERAL VASCULAR CATHETERIZATION  01/11/2015   Procedure: Lower Extremity Intervention;  Surgeon: Annice NeedyJason S Dew, MD;  Location: [facility] INVASIVE CV LAB;  Service: Cardiovascular;;  . RENAL ARTERY STENT  2014   Dr. Wyn Quakerew at  [facility]  . VAGINAL DELIVERY      Family History  Problem Relation Age of Onset  . Hypertension Mother   . Diabetes Mother   . Heart disease Mother        s/p CABG x 2  . Hypertension Father   . Cancer Father        lung   . Diabetes Sister   . Heart disease Sister   . Heart disease Sister   . Cancer - Lung Brother   . Cancer Sister        lung  . Heart disease Other   . Diabetes Other     No Known Allergies  CBC Latest Ref Rng & Units 07/17/2016 04/18/2016 03/06/2015  WBC 4.0 - 10.5 K/uL 8.4 9.0 14.5(H)  Hemoglobin 12.0 - 15.0 g/dL 16.113.2 11.8(L) 13.3  Hematocrit 36.0 - 46.0 % 40.4 35.4(L) 40.6  Platelets 150.0 - 400.0 K/uL 231.0 235.0 205      CMP     Component Value Date/Time   NA 136 07/27/2020 1125   NA 137 07/29/2012 1307   K 4.4 07/27/2020 1125   K 4.6 07/29/2012 1307   CL 104 07/27/2020 1125   CL 104 07/29/2012 1307   CO2 24 07/27/2020 1125   CO2 28 07/29/2012 1307   GLUCOSE 116 (H) 07/27/2020 1125   GLUCOSE 218 (H) 07/29/2012 1307   BUN 25 (H) 07/27/2020 1125   BUN 13 07/29/2012 1307   CREATININE 1.41 (H) 07/27/2020 1125   CREATININE 1.06 (H) 04/05/2018 1635   CALCIUM 9.6 07/27/2020 1125   CALCIUM 9.4 07/29/2012 1307   PROT 7.2 07/27/2020 1125   PROT 7.7 08/17/2016 1542   PROT 8.4 (H) 07/24/2012 2151   ALBUMIN 4.2 07/27/2020 1125   ALBUMIN 4.4 08/17/2016 1542   ALBUMIN 3.8 07/24/2012 2151   AST 15 07/27/2020 1125   AST 21 07/24/2012 2151   ALT 6 07/27/2020 1125   ALT 15 07/24/2012 2151   ALKPHOS 89 07/27/2020 1125   ALKPHOS 166 (H) 07/24/2012 2151   BILITOT 0.5 07/27/2020 1125   BILITOT 0.3 08/17/2016 1542   BILITOT 0.3 07/24/2012 2151   GFRNONAA >60 03/06/2015 1151   GFRNONAA >60 07/29/2012 1307   GFRAA >60 03/06/2015 1151   GFRAA >60 07/29/2012 1307     VAS US ABI WITH/WO TBI  Result Date: 10/19/2020  LOWER EXTREMITY DOPPLER STUDY Patient Name:  Danielle BeckmannBobbie J Park  Date of Exam:   10/19/2020 Medical Rec #: [id number]  Accession #:    [id number] Date of Birth: [birth year]      Patient Gender: F Patient Age:   79Y Exam Location:  [facility] Procedure:      VAS [place] ABI WITH/WO TBI Referring Phys: [id number] Marlow Baars DEW --------------------------------------------------------------------------------  Indications: Peripheral artery disease, and 01/11/2015 Right EIA stent, Left              CFA,SFA,pop PTA.  Comparison Study: 10/21/2019 Performing Technologist: Reece Agar RT (R)(VS)  Examination Guidelines: A complete evaluation includes at minimum, Doppler waveform signals and systolic blood pressure reading at the level of bilateral brachial, anterior tibial, and posterior tibial arteries, when vessel segments are accessible. Bilateral testing is considered an integral part of a complete examination. Photoelectric Plethysmograph (PPG) waveforms and toe systolic pressure readings are included as required and additional duplex testing as needed. Limited examinations for reoccurring indications may be performed as noted.  ABI Findings: +---------+------------------+-----+---------+--------+ Right    Rt Pressure (mmHg)IndexWaveform Comment  +---------+------------------+-----+---------+--------+ Brachial 147                                      +---------+------------------+-----+---------+--------+ ATA      133               0.89 triphasic         +---------+------------------+-----+---------+--------+ PTA      143               0.95 triphasic         +---------+------------------+-----+---------+--------+ Great Toe114               0.76 Normal            +---------+------------------+-----+---------+--------+ +---------+------------------+-----+---------+-------+ Left     Lt Pressure (mmHg)IndexWaveform Comment +---------+------------------+-----+---------+-------+ Brachial 150                                     +---------+------------------+-----+---------+-------+ ATA       129               0.86 triphasic        +---------+------------------+-----+---------+-------+ PTA      143               0.95 triphasic        +---------+------------------+-----+---------+-------+ Great Toe102               0.68 Normal           +---------+------------------+-----+---------+-------+ +-------+-----------+-----------+------------+------------+ ABI/TBIToday's ABIToday's TBIPrevious ABIPrevious TBI +-------+-----------+-----------+------------+------------+ Right  .95        .76        .98         .75          +-------+-----------+-----------+------------+------------+ Left   .95        .68        .89         .77          +-------+-----------+-----------+------------+------------+ Right ABIs appear essentially unchanged compared to prior study on 10/21/2019. Left ABIs appear increased compared to prior study on 10/21/2019.  Summary: Right: Resting right ankle-brachial index is within normal range. No evidence of significant right lower extremity arterial disease. The right toe-brachial index is normal. Right TBI appears essentially unchanged as compared to the previous exm on 10/21/2019. Left: Resting left ankle-brachial index is within normal  range. No evidence of significant left lower extremity arterial disease. The left toe-brachial index is abnormal. Left TBI appears slightly decreased as compared to the previous exam on 10/21/2019.  *See table(s) above for measurements and observations.  Electronically signed by Festus Barren MD on 10/19/2020 at 5:00:59 PM.    Final        Assessment & Plan:   1. Renal artery stenosis (HCC) BP today was acceptable Given patient's atherosclerosis and PAD optimal control of the patient's hypertension is important.  The patient's BP and noninvasive studies support the previous intervention is patent. No further intervention is indicated at this time.  Therefore the patient  will continue the current medications, no changes at  this time.  The primary medical service will continue aggressive antihypertensive therapy as per the AHA guidelines    We will continue with annual follow-ups due to patient's known solitary kidney.  2. PVD (peripheral vascular disease) (HCC) Patient is well-maintained as noted at last noninvasive study.  Patient will continue with annual follow-up.  3. Renovascular hypertension Continue antihypertensive medications as already ordered, these medications have been reviewed and there are no changes at this time.   4. Type 2 diabetes mellitus with stage 3a chronic kidney disease, with long-term current use of insulin (HCC) Continue hypoglycemic medications as already ordered, these medications have been reviewed and there are no changes at this time.  Hgb A1C to be monitored as already arranged by primary service    Current Outpatient Medications on File Prior to Visit  Medication Sig Dispense Refill  . amLODipine (NORVASC) 10 MG tablet TAKE 1 TABLET BY MOUTH EVERY DAY 90 tablet 1  . aspirin 81 MG tablet Take 81 mg by mouth daily.    . baclofen (LIORESAL) 10 MG tablet Take 0.5 tablets (5 mg total) by mouth 3 (three) times daily as needed for muscle spasms. 10 each 0  . cholecalciferol (VITAMIN D3) 25 MCG (1000 UT) tablet Take 2,000 Units by mouth daily.    . clopidogrel (PLAVIX) 75 MG tablet TAKE 1 TABLET (75 MG TOTAL) BY MOUTH DAILY. 90 tablet 2  . Dulaglutide (TRULICITY) 4.5 MG/0.5ML SOPN Inject 4.5 mg as directed once a week. 12 mL 2  . empagliflozin (JARDIANCE) 25 MG TABS tablet Take 25 mg by mouth daily before breakfast. 90 tablet 3  . Insulin Glargine (BASAGLAR KWIKPEN) 100 UNIT/ML Inject 20 Units into the skin daily. 15 mL 1  . Insulin Pen Needle (NOVOFINE PLUS PEN NEEDLE) 32G X 4 MM MISC 1 each by Does not apply route as directed. 90 each 3  . levothyroxine (SYNTHROID) 75 MCG tablet TAKE ONE TABLET BY MOUTH ONCE DAILY BEFORE BREAKFAST 90 tablet 1  . lisinopril (ZESTRIL) 40 MG  tablet TAKE 1 TABLET BY MOUTH EVERY DAY 90 tablet 1  . metoprolol tartrate (LOPRESSOR) 25 MG tablet TAKE 1 TABLET BY MOUTH TWICE A DAY 180 tablet 1  . rosuvastatin (CRESTOR) 10 MG tablet TAKE 1 TABLET BY MOUTH EVERY DAY 90 tablet 1   No current facility-administered medications on file prior to visit.    There are no Patient Instructions on file for this visit. No follow-ups on file.   Georgiana Spinner, NP

## 2020-12-05 ENCOUNTER — Other Ambulatory Visit: Payer: Self-pay | Admitting: Family Medicine

## 2020-12-06 ENCOUNTER — Other Ambulatory Visit: Payer: Self-pay | Admitting: Family Medicine

## 2020-12-28 ENCOUNTER — Other Ambulatory Visit: Payer: Self-pay | Admitting: Family Medicine

## 2021-01-05 DIAGNOSIS — N390 Urinary tract infection, site not specified: Secondary | ICD-10-CM | POA: Diagnosis not present

## 2021-01-20 ENCOUNTER — Telehealth: Payer: PPO

## 2021-01-20 DIAGNOSIS — I129 Hypertensive chronic kidney disease with stage 1 through stage 4 chronic kidney disease, or unspecified chronic kidney disease: Secondary | ICD-10-CM | POA: Diagnosis not present

## 2021-01-20 DIAGNOSIS — E1122 Type 2 diabetes mellitus with diabetic chronic kidney disease: Secondary | ICD-10-CM | POA: Diagnosis not present

## 2021-01-20 DIAGNOSIS — R809 Proteinuria, unspecified: Secondary | ICD-10-CM | POA: Diagnosis not present

## 2021-01-20 DIAGNOSIS — R319 Hematuria, unspecified: Secondary | ICD-10-CM | POA: Diagnosis not present

## 2021-01-20 DIAGNOSIS — I701 Atherosclerosis of renal artery: Secondary | ICD-10-CM | POA: Diagnosis not present

## 2021-01-20 DIAGNOSIS — N1831 Chronic kidney disease, stage 3a: Secondary | ICD-10-CM | POA: Diagnosis not present

## 2021-01-26 DIAGNOSIS — E1122 Type 2 diabetes mellitus with diabetic chronic kidney disease: Secondary | ICD-10-CM | POA: Diagnosis not present

## 2021-01-26 DIAGNOSIS — R809 Proteinuria, unspecified: Secondary | ICD-10-CM | POA: Diagnosis not present

## 2021-01-26 DIAGNOSIS — N1832 Chronic kidney disease, stage 3b: Secondary | ICD-10-CM | POA: Diagnosis not present

## 2021-01-26 DIAGNOSIS — I701 Atherosclerosis of renal artery: Secondary | ICD-10-CM | POA: Diagnosis not present

## 2021-01-26 DIAGNOSIS — I129 Hypertensive chronic kidney disease with stage 1 through stage 4 chronic kidney disease, or unspecified chronic kidney disease: Secondary | ICD-10-CM | POA: Diagnosis not present

## 2021-01-28 ENCOUNTER — Ambulatory Visit: Payer: PPO | Admitting: Family Medicine

## 2021-01-28 ENCOUNTER — Other Ambulatory Visit: Payer: Self-pay | Admitting: Family Medicine

## 2021-01-31 ENCOUNTER — Other Ambulatory Visit: Payer: Self-pay

## 2021-01-31 ENCOUNTER — Encounter: Payer: Self-pay | Admitting: Family Medicine

## 2021-01-31 ENCOUNTER — Ambulatory Visit (INDEPENDENT_AMBULATORY_CARE_PROVIDER_SITE_OTHER): Payer: PPO | Admitting: Family Medicine

## 2021-01-31 VITALS — BP 130/80 | HR 67 | Temp 99.0°F | Ht 63.0 in | Wt 176.2 lb

## 2021-01-31 DIAGNOSIS — R42 Dizziness and giddiness: Secondary | ICD-10-CM | POA: Diagnosis not present

## 2021-01-31 DIAGNOSIS — E1122 Type 2 diabetes mellitus with diabetic chronic kidney disease: Secondary | ICD-10-CM

## 2021-01-31 DIAGNOSIS — N1831 Chronic kidney disease, stage 3a: Secondary | ICD-10-CM | POA: Diagnosis not present

## 2021-01-31 DIAGNOSIS — Z794 Long term (current) use of insulin: Secondary | ICD-10-CM

## 2021-01-31 DIAGNOSIS — I150 Renovascular hypertension: Secondary | ICD-10-CM | POA: Diagnosis not present

## 2021-01-31 DIAGNOSIS — Z23 Encounter for immunization: Secondary | ICD-10-CM | POA: Diagnosis not present

## 2021-01-31 LAB — POCT GLYCOSYLATED HEMOGLOBIN (HGB A1C): Hemoglobin A1C: 5.4 % (ref 4.0–5.6)

## 2021-01-31 MED ORDER — EMPAGLIFLOZIN 25 MG PO TABS: 25.0000 mg | ORAL_TABLET | Freq: Every day | ORAL | 3 refills | Status: DC

## 2021-01-31 MED ORDER — BASAGLAR KWIKPEN 100 UNIT/ML ~~LOC~~ SOPN: 10.0000 [IU] | PEN_INJECTOR | Freq: Every day | SUBCUTANEOUS | 1 refills | Status: DC

## 2021-01-31 MED ORDER — AMLODIPINE BESYLATE 5 MG PO TABS: 5.0000 mg | ORAL_TABLET | Freq: Every day | ORAL | 1 refills | Status: DC

## 2021-01-31 NOTE — Assessment & Plan Note (Signed)
>>  ASSESSMENT AND PLAN FOR CONTROLLED DIABETES MELLITUS TYPE 2 WITH COMPLICATIONS (HCC) WRITTEN ON 01/31/2021 12:01 PM BY SONNENBERG, ERIC G, MD  A1c is very well controlled.  We will reduce her Basaglar  to 10 units once daily and continue on Trulicity  4.5 mg weekly, and Jardiance  25 mg once daily.  Jardiance  refilled for patient.

## 2021-01-31 NOTE — Progress Notes (Signed)
Marikay Alar, MD Phone: [phone]  Danielle Park is a 70 y.o. female who presents today for f/u.  HYPERTENSION Disease Monitoring Home BP Monitoring 106/60 Chest pain- no    Dyspnea- no Medications Compliance-  taking amlodipine, hydralazine, lisinopril, metoprolol, lasix. Lightheadedness-  yes  Edema- no BMET    Component Value Date/Time   NA 136 07/27/2020 1125   NA 137 07/29/2012 1307   K 4.4 07/27/2020 1125   K 4.6 07/29/2012 1307   CL 104 07/27/2020 1125   CL 104 07/29/2012 1307   CO2 24 07/27/2020 1125   CO2 28 07/29/2012 1307   GLUCOSE 116 (H) 07/27/2020 1125   GLUCOSE 218 (H) 07/29/2012 1307   BUN 25 (H) 07/27/2020 1125   BUN 13 07/29/2012 1307   CREATININE 1.41 (H) 07/27/2020 1125   CREATININE 1.06 (H) 04/05/2018 1635   CALCIUM 9.6 07/27/2020 1125   CALCIUM 9.4 07/29/2012 1307   GFRNONAA >60 03/06/2015 1151   GFRNONAA >60 07/29/2012 1307   GFRAA >60 03/06/2015 1151   GFRAA >60 07/29/2012 1307   Lightheadedness: Patient notes this has been going on about a week.  She describes it as a woozy sensation.  It is intermittent.  It does not always occur when she stands up.  She has not fallen.  She notes no dizziness or vertiginous symptoms.  Feels like she has taken too much of a medication though she has not started on any new medications.  She also notes it feels like she might be a little dehydrated.  She notes this coincided with running out of her [place] about a week ago.  No tinnitus, ear fullness, or hearing loss. She had her renal function checked last week with nephrology. The light headedness was going on at that time.   DIABETES Disease Monitoring: Blood Sugar ranges-94 this morning Polyuria/phagia/dipsia- no      Optho- UTD Medications: Compliance- taking basaglar, trulicity, ran out of jardiance one week ago Hypoglycemic symptoms- no   Social History   Tobacco Use  Smoking Status Never  Smokeless Tobacco Never    Current Outpatient  Medications on File Prior to Visit  Medication Sig Dispense Refill   aspirin 81 MG tablet Take 81 mg by mouth daily.     baclofen (LIORESAL) 10 MG tablet TAKE 1/2 TABLET BY MOUTH 3 TIMES DAILY AS NEEDED FOR MUSCLE SPASMS. 10 tablet 0   cholecalciferol (VITAMIN D3) 25 MCG (1000 UT) tablet Take 2,000 Units by mouth daily.     clopidogrel (PLAVIX) 75 MG tablet TAKE 1 TABLET (75 MG TOTAL) BY MOUTH DAILY. 90 tablet 2   Dulaglutide (TRULICITY) 4.5 MG/0.5ML SOPN Inject 4.5 mg as directed once a week. 12 mL 2   furosemide (LASIX) 20 MG tablet TAKE 1 TABLET BY MOUTH EVERY DAY 90 tablet 1   hydrALAZINE (APRESOLINE) 25 MG tablet TAKE 1 TABLET BY MOUTH THREE TIMES A DAY 270 tablet 1   Insulin Pen Needle (NOVOFINE PLUS PEN NEEDLE) 32G X 4 MM MISC 1 each by Does not apply route as directed. 90 each 3   levothyroxine (SYNTHROID) 75 MCG tablet TAKE ONE TABLET BY MOUTH ONCE DAILY BEFORE BREAKFAST 90 tablet 1   lisinopril (ZESTRIL) 40 MG tablet TAKE 1 TABLET BY MOUTH EVERY DAY 90 tablet 1   metoprolol tartrate (LOPRESSOR) 25 MG tablet TAKE 1 TABLET BY MOUTH TWICE A DAY 180 tablet 1   rosuvastatin (CRESTOR) 10 MG tablet TAKE 1 TABLET BY MOUTH EVERY DAY 90 tablet 1  No current facility-administered medications on file prior to visit.     ROS see history of present illness  Objective  Physical Exam Vitals:   01/31/21 1138  BP: 130/80  Pulse: 67  Temp: 99 F (37.2 C)  SpO2: 96%   Lying blood pressure 165/80 pulse 66 Sitting blood pressure 155/84 pulse 66 Standing blood pressure 130/75 pulse 66  BP Readings from Last 3 Encounters:  01/31/21 130/80  11/12/20 115/69  10/19/20 134/65   Wt Readings from Last 3 Encounters:  01/31/21 176 lb 3.2 oz (79.9 kg)  11/12/20 174 lb (78.9 kg)  10/19/20 175 lb (79.4 kg)    Physical Exam Constitutional:      General: She is not in acute distress.    Appearance: She is not diaphoretic.  Cardiovascular:     Rate and Rhythm: Normal rate and regular rhythm.      Heart sounds: Normal heart sounds.  Pulmonary:     Effort: Pulmonary effort is normal.     Breath sounds: Normal breath sounds.  Skin:    General: Skin is warm and dry.  Neurological:     Mental Status: She is alert.     Assessment/Plan: Please see individual problem list.  Problem List Items Addressed This Visit     Diabetes (HCC) (Chronic)    A1c is very well controlled.  We will reduce her Basaglar to 10 units once daily and continue on Trulicity 4.5 mg weekly, and Jardiance 25 mg once daily.  Jardiance refilled for patient.      Relevant Medications   empagliflozin (JARDIANCE) 25 MG TABS tablet   Insulin Glargine (BASAGLAR KWIKPEN) 100 UNIT/ML   Other Relevant Orders   POCT HgB A1C   Lightheadedness    Possibly related to overtreatment of her blood pressure.  Dehydration could be playing a role though her recent kidney function was better than her baseline through nephrology.  I encouraged adequate water intake.  We will reduce her amlodipine dose to 10 mg once daily.  She will follow-up in 1 month.      Renovascular hypertension    Very well controlled at home.  She is getting some lightheadedness which may be orthostasis related or low blood pressure related.  We will reduce her amlodipine dose to 5 mg once daily.  She will continue her hydralazine 25 mg 3 times daily, Lasix 20 mg daily, lisinopril 40 mg daily, and metoprolol 25 mg twice daily.  She will return in 1 month for recheck.  If her symptoms are not improving in the next week or so she will let us know.      Relevant Medications   amLODipine (NORVASC) 5 MG tablet   Other Visit Diagnoses     Need for immunization against influenza    -  Primary   Relevant Orders   Flu Vaccine QUAD High Dose(Fluad) (Completed)        Health Maintenance: The patient declines mammogram.  She notes there is no family history.  She understands the risk of missing a significant lesion without having a mammogram done.  She  will let us know if she changes her mind.  She is considering a colonoscopy though does not want this ordered right now.  She was given a flu vaccine today.  She was advised this was an inactivated vaccine.  Return in about 1 month (around 03/02/2021) for Hypertension/lightheadedness/diabetes.  This visit occurred during the SARS-CoV-2 public health emergency.  Safety protocols were in place, including screening  questions prior to the visit, additional usage of staff PPE, and extensive cleaning of exam room while observing appropriate contact time as indicated for disinfecting solutions.    Tommi Rumps, MD [place]

## 2021-01-31 NOTE — Assessment & Plan Note (Addendum)
A1c is very well controlled.  We will reduce her Basaglar to 10 units once daily and continue on Trulicity 4.5 mg weekly, and Jardiance 25 mg once daily.  Jardiance refilled for patient.

## 2021-01-31 NOTE — Assessment & Plan Note (Signed)
Very well controlled at home.  She is getting some lightheadedness which may be orthostasis related or low blood pressure related.  We will reduce her amlodipine dose to 5 mg once daily.  She will continue her hydralazine 25 mg 3 times daily, Lasix 20 mg daily, lisinopril 40 mg daily, and metoprolol 25 mg twice daily.  She will return in 1 month for recheck.  If her symptoms are not improving in the next week or so she will let us know.

## 2021-01-31 NOTE — Assessment & Plan Note (Signed)
Possibly related to overtreatment of her blood pressure.  Dehydration could be playing a role though her recent kidney function was better than her baseline through nephrology.  I encouraged adequate water intake.  We will reduce her amlodipine dose to 10 mg once daily.  She will follow-up in 1 month.

## 2021-01-31 NOTE — Patient Instructions (Addendum)
Nice to see you. We are going to reduce your amlodipine to 5 mg once daily.  I sent a new prescription to your pharmacy.  Please try to remain adequately hydrated.  If your lightheadedness does not improve over the next week with this change please let us know. Your A1c is very well controlled.  Please reduce your Basaglar to 10 units once daily.  Please keep track of your sugars.  We will discuss again in 1 month.

## 2021-02-02 MED ORDER — EMPAGLIFLOZIN 25 MG PO TABS
25.0000 mg | ORAL_TABLET | Freq: Every day | ORAL | 3 refills | Status: DC
Start: 2021-02-02 — End: 2022-01-17

## 2021-02-02 NOTE — Addendum Note (Signed)
Addended by: Birdie Sons, Ulysees Robarts G on: 02/02/2021 11:12 AM   Modules accepted: Orders

## 2021-02-12 ENCOUNTER — Other Ambulatory Visit: Payer: Self-pay | Admitting: Family Medicine

## 2021-03-11 ENCOUNTER — Encounter: Payer: Self-pay | Admitting: Family Medicine

## 2021-03-11 ENCOUNTER — Other Ambulatory Visit: Payer: Self-pay

## 2021-03-11 ENCOUNTER — Ambulatory Visit (INDEPENDENT_AMBULATORY_CARE_PROVIDER_SITE_OTHER): Payer: PPO | Admitting: Family Medicine

## 2021-03-11 ENCOUNTER — Telehealth: Payer: PPO

## 2021-03-11 DIAGNOSIS — R42 Dizziness and giddiness: Secondary | ICD-10-CM

## 2021-03-11 DIAGNOSIS — H6990 Unspecified Eustachian tube disorder, unspecified ear: Secondary | ICD-10-CM | POA: Insufficient documentation

## 2021-03-11 DIAGNOSIS — H6982 Other specified disorders of Eustachian tube, left ear: Secondary | ICD-10-CM | POA: Diagnosis not present

## 2021-03-11 DIAGNOSIS — I150 Renovascular hypertension: Secondary | ICD-10-CM | POA: Diagnosis not present

## 2021-03-11 DIAGNOSIS — H6980 Other specified disorders of Eustachian tube, unspecified ear: Secondary | ICD-10-CM | POA: Insufficient documentation

## 2021-03-11 NOTE — Assessment & Plan Note (Signed)
Likely BPPV related given her description.  This has been a chronic ongoing issue.  She will do the modified Epley maneuvers at home and monitor for any worsening.

## 2021-03-11 NOTE — Assessment & Plan Note (Signed)
Adequately controlled at home.  Her lightheadedness has improved significantly.  She will continue amlodipine 5 mg once daily, Lasix 20 mg daily, hydralazine 25 mg 3 times daily, lisinopril 40 mg daily, and metoprolol 25 mg twice daily.

## 2021-03-11 NOTE — Assessment & Plan Note (Signed)
Likely related to overtreatment of her blood pressure.  This has resolved at this time.  She will monitor for recurrence.

## 2021-03-11 NOTE — Progress Notes (Signed)
Marikay Alar, MD Phone: [phone]  Danielle Park is a 70 y.o. female who presents today for f/u.  HYPERTENSION Disease Monitoring Home BP Monitoring 110s/60s Chest pain- no    Dyspnea- no Medications Compliance-  taking amlodipine, hydralazine, lisinopril, metoprolol, lasix. Lightheadedness-  no, resolved with dose reduction of amlodipine  Edema- no BMET    Component Value Date/Time   NA 136 07/27/2020 1125   NA 137 07/29/2012 1307   K 4.4 07/27/2020 1125   K 4.6 07/29/2012 1307   CL 104 07/27/2020 1125   CL 104 07/29/2012 1307   CO2 24 07/27/2020 1125   CO2 28 07/29/2012 1307   GLUCOSE 116 (H) 07/27/2020 1125   GLUCOSE 218 (H) 07/29/2012 1307   BUN 25 (H) 07/27/2020 1125   BUN 13 07/29/2012 1307   CREATININE 1.41 (H) 07/27/2020 1125   CREATININE 1.06 (H) 04/05/2018 1635   CALCIUM 9.6 07/27/2020 1125   CALCIUM 9.4 07/29/2012 1307   GFRNONAA >60 03/06/2015 1151   GFRNONAA >60 07/29/2012 1307   GFRAA >60 03/06/2015 1151   GFRAA >60 07/29/2012 1307   Vertigo: This is a intermittent chronic issue.  Occurs with head position change when she lays down at night or when she gets up in the morning.  It last 1 to 2 seconds.  She has done modified Epley maneuvers at home in the past.  Left ear fullness: Patient notes this occurs intermittently and is not terribly bothersome.  She does not take any allergy medications.  Social History   Tobacco Use  Smoking Status Never  Smokeless Tobacco Never    Current Outpatient Medications on File Prior to Visit  Medication Sig Dispense Refill   amLODipine (NORVASC) 5 MG tablet Take 1 tablet (5 mg total) by mouth daily. 90 tablet 1   aspirin 81 MG tablet Take 81 mg by mouth daily.     baclofen (LIORESAL) 10 MG tablet TAKE 1/2 TABLET BY MOUTH 3 TIMES DAILY AS NEEDED FOR MUSCLE SPASMS. 10 tablet 0   BD PEN NEEDLE NANO 2ND GEN 32G X 4 MM MISC USE 1 PEN NEEDLE DAILY AS DIRECTED 100 each 3   cholecalciferol (VITAMIN D3) 25 MCG (1000  UT) tablet Take 2,000 Units by mouth daily.     clopidogrel (PLAVIX) 75 MG tablet TAKE 1 TABLET (75 MG TOTAL) BY MOUTH DAILY. 90 tablet 2   Dulaglutide (TRULICITY) 4.5 MG/0.5ML SOPN Inject 4.5 mg as directed once a week. 12 mL 2   empagliflozin (JARDIANCE) 25 MG TABS tablet Take 1 tablet (25 mg total) by mouth daily before breakfast. 90 tablet 3   furosemide (LASIX) 20 MG tablet TAKE 1 TABLET BY MOUTH EVERY DAY 90 tablet 1   hydrALAZINE (APRESOLINE) 25 MG tablet TAKE 1 TABLET BY MOUTH THREE TIMES A DAY 270 tablet 1   Insulin Glargine (BASAGLAR KWIKPEN) 100 UNIT/ML Inject 10 Units into the skin daily. 15 mL 1   levothyroxine (SYNTHROID) 75 MCG tablet TAKE ONE TABLET BY MOUTH ONCE DAILY BEFORE BREAKFAST 90 tablet 1   lisinopril (ZESTRIL) 40 MG tablet TAKE 1 TABLET BY MOUTH EVERY DAY 90 tablet 1   metoprolol tartrate (LOPRESSOR) 25 MG tablet TAKE 1 TABLET BY MOUTH TWICE A DAY 180 tablet 1   rosuvastatin (CRESTOR) 10 MG tablet TAKE 1 TABLET BY MOUTH EVERY DAY 90 tablet 1   No current facility-administered medications on file prior to visit.     ROS see history of present illness  Objective  Physical Exam Vitals:  03/11/21 1053 03/11/21 1054  BP: 118/80 118/80  Pulse:    Temp:    SpO2:      BP Readings from Last 3 Encounters:  03/11/21 118/80  01/31/21 130/80  11/12/20 115/69   Wt Readings from Last 3 Encounters:  03/11/21 176 lb 12.8 oz (80.2 kg)  01/31/21 176 lb 3.2 oz (79.9 kg)  11/12/20 174 lb (78.9 kg)    Physical Exam Constitutional:      General: She is not in acute distress.    Appearance: She is not diaphoretic.  HENT:     Right Ear: Tympanic membrane and ear canal normal.     Left Ear: Tympanic membrane and ear canal normal.  Cardiovascular:     Rate and Rhythm: Normal rate and regular rhythm.     Heart sounds: Normal heart sounds.  Pulmonary:     Effort: Pulmonary effort is normal.     Breath sounds: Normal breath sounds.  Skin:    General: Skin is warm  and dry.  Neurological:     Mental Status: She is alert.     Assessment/Plan: Please see individual problem list.  Problem List Items Addressed This Visit     Eustachian tube dysfunction    I suspect this is the cause of her left ear fullness.  She can try Flonase at home if it is bothersome.  If it worsens she will let me know.      Lightheadedness    Likely related to overtreatment of her blood pressure.  This has resolved at this time.  She will monitor for recurrence.      Renovascular hypertension    Adequately controlled at home.  Her lightheadedness has improved significantly.  She will continue amlodipine 5 mg once daily, Lasix 20 mg daily, hydralazine 25 mg 3 times daily, lisinopril 40 mg daily, and metoprolol 25 mg twice daily.      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  This has been a chronic ongoing issue.  She will do the modified Epley maneuvers at home and monitor for any worsening.       Return in about 3 months (around 06/09/2021).  This visit occurred during the SARS-CoV-2 public health emergency.  Safety protocols were in place, including screening questions prior to the visit, additional usage of staff PPE, and extensive cleaning of exam room while observing appropriate contact time as indicated for disinfecting solutions.    Marikay Alar, MD [facility]

## 2021-03-11 NOTE — Patient Instructions (Signed)
Nice to see you. Please continue with your current blood pressure medication regimen. Please try to do the exercises at home for the vertigo. If your left ear fullness worsens you can try Flonase though you should also let us know if it worsens significantly.

## 2021-03-11 NOTE — Assessment & Plan Note (Signed)
I suspect this is the cause of her left ear fullness.  She can try Flonase at home if it is bothersome.  If it worsens she will let me know.

## 2021-03-22 ENCOUNTER — Ambulatory Visit: Payer: PPO | Admitting: Pharmacist

## 2021-03-22 DIAGNOSIS — Z794 Long term (current) use of insulin: Secondary | ICD-10-CM

## 2021-03-22 DIAGNOSIS — E785 Hyperlipidemia, unspecified: Secondary | ICD-10-CM

## 2021-03-22 DIAGNOSIS — I150 Renovascular hypertension: Secondary | ICD-10-CM

## 2021-03-22 DIAGNOSIS — I739 Peripheral vascular disease, unspecified: Secondary | ICD-10-CM

## 2021-03-22 NOTE — Patient Instructions (Signed)
Visit Information  Following are the goals we discussed today:  Patient Goals/Self-Care Activities Over the next 90 days, patient will:  - take medications as prescribed check blood glucose twice daily, document, and provide at future appointments collaborate with provider on medication access solutions        Plan: Telephone follow up appointment with care management team member scheduled for:  6 months   [name], PharmD, [place], CPP Clinical Pharmacist [facility] [phone]     Please call the care guide team at [phone] if you need to cancel or reschedule your appointment.   Patient verbalizes understanding of instructions provided today and agrees to view in MyChart.

## 2021-03-22 NOTE — Chronic Care Management (AMB) (Signed)
Chronic Care Management CCM Pharmacy Note  03/22/2021 Name:  Danielle Park MRN:  [id number] DOB:  April 25, 1950  Summary: - Tolerating regimen. A1c very well controlled  Recommendations/Changes made from today's visit: - D/c Basaglar to reduce risk of hypoglycemia - Due to reapply for 2023 assistance  Subjective: Danielle Park is an 70 y.o. year old female who is a primary patient of Birdie Sons, Yehuda Mao, MD.  The CCM team was consulted for assistance with disease management and care coordination needs.    Engaged with patient by telephone for follow up visit for pharmacy case management and/or care coordination services.   Objective:  Medications Reviewed Today     Reviewed by Lourena Simmonds, RPH-CPP (Pharmacist) on 03/22/21 at 1530  Med List Status: <None>   Medication Order Taking? Sig Documenting Provider Last Dose Status Informant  amLODipine (NORVASC) 5 MG tablet [id number] Yes Take 1 tablet (5 mg total) by mouth daily. Glori Luis, MD Taking Active   aspirin 81 MG tablet [id number] Yes Take 81 mg by mouth daily. [provider] Taking Active   baclofen (LIORESAL) 10 MG tablet [id number] Yes TAKE 1/2 TABLET BY MOUTH 3 TIMES DAILY AS NEEDED FOR MUSCLE SPASMS. Glori Luis, MD Taking Active   BD PEN NEEDLE NANO 2ND GEN 32G X 4 MM MISC [id number] Yes USE 1 PEN NEEDLE DAILY AS DIRECTED Glori Luis, MD Taking Active   cholecalciferol (VITAMIN D3) 25 MCG (1000 UT) tablet [id number] Yes Take 2,000 Units by mouth daily. [provider] Taking Active   clopidogrel (PLAVIX) 75 MG tablet [id number] Yes TAKE 1 TABLET (75 MG TOTAL) BY MOUTH DAILY. Glori Luis, MD Taking Active   Dulaglutide (TRULICITY) 4.5 MG/0.5ML [name] [id number] Yes Inject 4.5 mg as directed once a week. Glori Luis, MD Taking Active   empagliflozin (JARDIANCE) 25 MG TABS tablet [id number] Yes Take 1 tablet (25 mg total) by mouth daily before breakfast. Glori Luis, MD  Taking Active   furosemide (LASIX) 20 MG tablet [id number] Yes TAKE 1 TABLET BY MOUTH EVERY DAY Glori Luis, MD Taking Active   hydrALAZINE (APRESOLINE) 25 MG tablet [id number] Yes TAKE 1 TABLET BY MOUTH THREE TIMES A DAY Glori Luis, MD Taking Active            Med Note Lourena Simmonds   Tue Mar 22, 2021  3:30 PM) Twice daily   Insulin Glargine (BASAGLAR KWIKPEN) 100 UNIT/ML [id number] Yes Inject 10 Units into the skin daily. Glori Luis, MD Taking Active   levothyroxine (SYNTHROID) 75 MCG tablet [id number] Yes TAKE ONE TABLET BY MOUTH ONCE DAILY BEFORE BREAKFAST Glori Luis, MD Taking Active   lisinopril (ZESTRIL) 40 MG tablet [id number] Yes TAKE 1 TABLET BY MOUTH EVERY DAY Glori Luis, MD Taking Active   metoprolol tartrate (LOPRESSOR) 25 MG tablet [id number] Yes TAKE 1 TABLET BY MOUTH TWICE A DAY Glori Luis, MD Taking Active   rosuvastatin (CRESTOR) 10 MG tablet [id number] Yes TAKE 1 TABLET BY MOUTH EVERY DAY Glori Luis, MD Taking Active             Pertinent Labs:   Lab Results  Component Value Date   HGBA1C 5.4 01/31/2021   Lab Results  Component Value Date   CHOL 129 07/27/2020   HDL 51.70 07/27/2020   LDLCALC 61 07/27/2020   LDLDIRECT 59.0 10/02/2018   TRIG 83.0 07/27/2020   CHOLHDL 3 07/27/2020   Lab Results  Component Value Date   CREATININE 1.41 (H) 07/27/2020   BUN 25 (H) 07/27/2020   NA 136 07/27/2020   K 4.4 07/27/2020   CL 104 07/27/2020   CO2 24 07/27/2020    SDOH:  (Social Determinants of Health) assessments and interventions performed:  SDOH Interventions    Flowsheet Row Most Recent Value  SDOH Interventions   Financial Strain Interventions Other (Comment)  [manufacturer assistance]       CCM Care Plan  Review of patient past medical history, allergies, medications, health status, including review of consultants reports, laboratory and other test data, was performed as part of comprehensive  evaluation and provision of chronic care management services.   Care Plan : Medication Management  Updates made by Lourena Simmonds, RPH-CPP since 03/22/2021 12:00 AM     Problem: Diabetes, Hypertension, Hyperlipidemia      Long-Range Goal: Disease Progression Prevention   Recent Progress: On track  Priority: High  Note:   Current Barriers:  Unable to independently afford treatment regimen Complex patient with multiple comorbidities   Pharmacist Clinical Goal(s):  Over the next 90 days, patient will verbalize ability to afford treatment regimen. Over the next 90 days, patient will maintain control of diabetes as evidenced by A1c through collaboration with PharmD and provider.   Interventions: 1:1 collaboration with Glori Luis, MD regarding development and update of comprehensive plan of care as evidenced by provider attestation and co-signature Inter-disciplinary care team collaboration (see longitudinal plan of care) Comprehensive medication review performed; medication list updated in electronic medical record  Health Maintenance   Yearly diabetic eye exam: up to date Yearly diabetic foot exam: due - placed reminder in next appointment notes Urine microalbumin: up to date Yearly influenza vaccination: up to date Td/Tdap vaccination: up to date Pneumonia vaccination: up to date COVID vaccinations: due - recommended bivalent booster Shingrix vaccinations: due - recommended to pursue in 2023 Colonoscopy: due - discussed with PCP last visit Bone density scan: up to date Mammogram: up to date - discussed with PCP last visit   Diabetes: Controlled; current treatment: Trulicity 4.5 mg weekly, Basaglar 10 units daily, Jardiance 25 mg daily Approved for Trulicity and Hospital doctor assistance, as well as Jardiance assistance, through 03/26/21 Avoiding metformin d/t single kidney per documentation from prior PCP Current glucose readings: fastings: 100-120; 2 hour post  prandial: not checking Denies any concerns with hypoglycemia Exercise - walking 30 minutes daily on the treadmill  Reviewed controlled A1c. Discussed trial of stopping Basaglar. Patient amenable. Stop Basaglar, continue Trulicity 4.5 mg weekly and Jardiance 25 mg daily. Reviewed goal A1c, goal fasting, goal 2 hour post prandial glucose readings.  Will collaborate w/ CPhT, patient, and providers to reapply for patient assistance for Trulicity, Jardiance for 2023.  Hypertension: Controlled and less dizziness now; current treatment: lisinopril 40 mg QAM, amlodipine 5 mg QAM, furosemide 20 mg QAM; hydralazine 25 mg BID (taking twice daily), metoprolol tartrate 25 mg BID Current home readings: 110/60-70s Recommended she continue current therapy at this time.   Hyperlipidemia and Cardiovascular Risk Reduction: Controlled per last lipid panel; current treatment: rosuvastatin 10 mg daily Antiplatelet treatment (PVD, hx renal artery stenting): aspirin 81 mg daily, clopidogrel 75 mg daily Recommended to continue current regimen at this time.   Hypothyroidism: Controlled per last labs; current treatment: levothyroxine 75 mg daily  Recommended to continue current regimen at this time  Supplements: Current regimen: Vitamin D 2000 units daily   Patient Goals/Self-Care Activities Over the next 90 days, patient  will:  - take medications as prescribed check blood glucose twice daily, document, and provide at future appointments collaborate with provider on medication access solutions      Plan: Telephone follow up appointment with care management team member scheduled for:  6 months  [name], PharmD, [place], CPP Clinical Pharmacist [employer] at [employer] [phone]

## 2021-03-23 ENCOUNTER — Other Ambulatory Visit: Payer: Self-pay | Admitting: Family Medicine

## 2021-03-23 NOTE — Telephone Encounter (Signed)
Last OV 03/11/21 okay to fill baclofen?

## 2021-03-30 ENCOUNTER — Telehealth: Payer: Self-pay | Admitting: Pharmacy Technician

## 2021-03-30 DIAGNOSIS — Z596 Low income: Secondary | ICD-10-CM

## 2021-03-30 NOTE — Progress Notes (Signed)
[facility])                                            [facility] Quality Pharmacy Team    03/30/2021  Danielle Park 07/11/1950 [id number]                                      Medication Assistance Referral  Referral From: [facility] Embedded RPh Danielle T.   Medication/Company: [employer] / [employer] Patient application portion:  Mailed Provider application portion: Interoffice Mailed to Dr. Birdie Sons Provider address/fax verified via: Office website  Medication/Company: Trudee Kuster / [name] Patient application portion:  Mailed Provider application portion: Interoffice Mailed to Dr. Birdie Sons Provider address/fax verified via: Office website  Danielle Gerken P. Danielle Park, CPhT [facility] [employer]  [phone]

## 2021-04-05 ENCOUNTER — Other Ambulatory Visit: Payer: Self-pay | Admitting: Family Medicine

## 2021-04-26 ENCOUNTER — Telehealth: Payer: Self-pay | Admitting: Pharmacy Technician

## 2021-04-26 DIAGNOSIS — Z596 Low income: Secondary | ICD-10-CM

## 2021-04-26 NOTE — Progress Notes (Signed)
[facility])                                            [facility] Quality Pharmacy Team    04/26/2021  [name] 1950/04/25 [id number]  Received both patient and provider portion(s) of patient assistance application(s) for Jardiance and Trulicity. Faxed completed application and required documents into [employer] and [name].  Donnell Wion P. Emmry Hinsch, CPhT [facility] [employer]  [phone]

## 2021-04-28 DIAGNOSIS — E113393 Type 2 diabetes mellitus with moderate nonproliferative diabetic retinopathy without macular edema, bilateral: Secondary | ICD-10-CM | POA: Diagnosis not present

## 2021-04-28 LAB — HM DIABETES EYE EXAM

## 2021-05-07 ENCOUNTER — Other Ambulatory Visit: Payer: Self-pay | Admitting: Family Medicine

## 2021-05-11 ENCOUNTER — Telehealth: Payer: Self-pay | Admitting: Pharmacy Technician

## 2021-05-11 DIAGNOSIS — Z596 Low income: Secondary | ICD-10-CM

## 2021-05-11 NOTE — Progress Notes (Signed)
[facility])                                            [facility] Quality Pharmacy Team    05/11/2021  Danielle Park 1950-05-04 [id number]  Care coordination calls placed to both [employer] in regard to Jardiance application and also to [name] in regard to Trulicity application.  Spoke to Tammy at [facility] who informs patient is APPROVED 04/27/21-03/26/22 for Jardiance. She informs first shipment will arrive automatically to the patient's home based on last fill date in 2022. Subsequent refills will need to be called in by the patient. They can call [employer] at [phone].  Spoke to [place] at [facility] who informs the patient's application for Trulicity was received on 04/26/21 but is still processing at this time.  [name] P. Jarl Sellitto, CPhT [facility] [employer]  [phone]

## 2021-05-16 ENCOUNTER — Telehealth: Payer: Self-pay | Admitting: Pharmacy Technician

## 2021-05-16 DIAGNOSIS — Z596 Low income: Secondary | ICD-10-CM

## 2021-05-16 NOTE — Progress Notes (Signed)
[facility])                                            [facility] Quality Pharmacy Team    05/16/2021  Danielle Park 09-17-1950 [id number]  Care coordination call placed to [name] in regard to Trulicity application.   Spoke to [name] who informs patient is APPROVED 05/12/2021-03/26/2022. She informs medication will ship based on last fill date in 2022 and going forward in 2023 with delivery to the patient's home. Patient can always call [name] at [phone] to check on shipment status or [facility], which is the pharmacy [name] uses to process and deliver medications, by calling [phone].  Briunna Leicht P. [name], CPhT [facility] [employer]  [phone]

## 2021-05-29 ENCOUNTER — Other Ambulatory Visit: Payer: Self-pay | Admitting: Family Medicine

## 2021-05-30 ENCOUNTER — Telehealth: Payer: Self-pay

## 2021-05-30 ENCOUNTER — Ambulatory Visit: Payer: PPO

## 2021-05-30 NOTE — Telephone Encounter (Signed)
No answer when called for scheduled AWV. Left voicemail to call the office back and reschedule.  ?

## 2021-05-31 ENCOUNTER — Ambulatory Visit: Payer: Self-pay | Admitting: Pharmacist

## 2021-05-31 NOTE — Patient Instructions (Signed)
Hi Danise,  ? ?Unfortunately, I am being asked to quickly transition into another role within the health system, so I am unable to keep our next appointment. Please continue to follow up with your primary care provider as scheduled.  ? ?As a reminder - ? ?For prescription refills for Jardiance through the [facility], call [phone]. Once you have been enrolled in the program, your prescriptions can easily be refilled by contacting the phone number above Monday though Friday 8:30 AM - 6:00 PM.  ? ?The [employer] Patient Assistance Program for Trulicity and Mariella Saa has an auto-refill program where you do not need to call and request a refill each time. You should receive your medication shipment before you run out of your medication. However, if you have any issues or need assistance, you can call them at [phone]. They are available Monday though Friday 8:00 AM - 5:00 PM.  ? ? ? ?It has been a pleasure working with you! ? ?[name], PharmD ? ?

## 2021-05-31 NOTE — Chronic Care Management (AMB) (Signed)
?  Chronic Care Management  ? ?Note ? ?05/31/2021 ?Name: RUBEN MANGRAM MRN: [id number] DOB: 28-Sep-1950 ? ? ? ?Closing pharmacy CCM case at this time.  Patient has clinic contact information for future questions or concerns.  ? ?[name], PharmD, [place], CPP ?Clinical Pharmacist ?Therapist, music at [employer] ?[phone] ? ?

## 2021-06-03 NOTE — Addendum Note (Signed)
Addended by: Lourena Simmonds on: 06/03/2021 01:18 PM   Modules accepted: Orders

## 2021-06-10 ENCOUNTER — Ambulatory Visit (INDEPENDENT_AMBULATORY_CARE_PROVIDER_SITE_OTHER): Payer: PPO | Admitting: Family Medicine

## 2021-06-10 ENCOUNTER — Encounter: Payer: Self-pay | Admitting: Family Medicine

## 2021-06-10 ENCOUNTER — Other Ambulatory Visit: Payer: Self-pay

## 2021-06-10 VITALS — BP 120/70 | HR 67 | Temp 98.7°F | Ht 63.0 in | Wt 177.0 lb

## 2021-06-10 DIAGNOSIS — I150 Renovascular hypertension: Secondary | ICD-10-CM | POA: Diagnosis not present

## 2021-06-10 DIAGNOSIS — E1122 Type 2 diabetes mellitus with diabetic chronic kidney disease: Secondary | ICD-10-CM | POA: Diagnosis not present

## 2021-06-10 DIAGNOSIS — E785 Hyperlipidemia, unspecified: Secondary | ICD-10-CM

## 2021-06-10 DIAGNOSIS — Z794 Long term (current) use of insulin: Secondary | ICD-10-CM

## 2021-06-10 DIAGNOSIS — E039 Hypothyroidism, unspecified: Secondary | ICD-10-CM

## 2021-06-10 DIAGNOSIS — N1831 Chronic kidney disease, stage 3a: Secondary | ICD-10-CM

## 2021-06-10 LAB — POCT GLYCOSYLATED HEMOGLOBIN (HGB A1C): Hemoglobin A1C: 6.5 % — AB (ref 4.0–5.6)

## 2021-06-10 LAB — TSH: TSH: 2.16 u[IU]/mL (ref 0.35–5.50)

## 2021-06-10 NOTE — Assessment & Plan Note (Signed)
Check TSH.  Continue Synthroid 75 mcg once daily. 

## 2021-06-10 NOTE — Progress Notes (Signed)
?Marikay Alar, MD ?Phone: [phone] ? ?Danielle Park is a 71 y.o. female who presents today for f/u. ? ?HYPERTENSION ?Disease Monitoring: ?Blood pressure range-108-111/63-70  Chest pain- no      Dyspnea- no ?Medications: ?Compliance- taking amlodipine, lasix, hydralazine, lisinopril, metoprolol    Edema- no ? ?DIABETES ?Disease Monitoring: ?Blood Sugar ranges-117-155 Polyuria/phagia/dipsia- no      Optho- UTD ?Medications: ?Compliance- taking trulicity, jardiance Hypoglycemic symptoms- no ? ?HYPERLIPIDEMIA ?Disease Monitoring: ?See symptoms for Hypertension - no claudication ?Medications: ?Compliance- taking crestor Right upper quadrant pain- no  Muscle aches- no ? ? ? ?Social History  ? ?Tobacco Use  ?Smoking Status Never  ?Smokeless Tobacco Never  ? ? ?Current Outpatient Medications on File Prior to Visit  ?Medication Sig Dispense Refill  ? amLODipine (NORVASC) 5 MG tablet Take 1 tablet (5 mg total) by mouth daily. 90 tablet 1  ? aspirin 81 MG tablet Take 81 mg by mouth daily.    ? baclofen (LIORESAL) 10 MG tablet TAKE 1/2 TABLET BY MOUTH 3 TIMES DAILY AS NEEDED FOR MUSCLE SPASMS. 10 tablet 0  ? BD PEN NEEDLE NANO 2ND GEN 32G X 4 MM MISC USE 1 PEN NEEDLE DAILY AS DIRECTED 100 each 3  ? cholecalciferol (VITAMIN D3) 25 MCG (1000 UT) tablet Take 2,000 Units by mouth daily.    ? clopidogrel (PLAVIX) 75 MG tablet TAKE 1 TABLET BY MOUTH EVERY DAY 90 tablet 2  ? Dulaglutide (TRULICITY) 4.5 MG/0.5ML SOPN Inject 4.5 mg as directed once a week. 12 mL 2  ? empagliflozin (JARDIANCE) 25 MG TABS tablet Take 1 tablet (25 mg total) by mouth daily before breakfast. 90 tablet 3  ? furosemide (LASIX) 20 MG tablet TAKE 1 TABLET BY MOUTH EVERY DAY 90 tablet 1  ? hydrALAZINE (APRESOLINE) 25 MG tablet TAKE 1 TABLET BY MOUTH THREE TIMES A DAY 270 tablet 1  ? levothyroxine (SYNTHROID) 75 MCG tablet TAKE ONE TABLET BY MOUTH ONCE DAILY BEFORE BREAKFAST 90 tablet 0  ? lisinopril (ZESTRIL) 40 MG tablet TAKE 1 TABLET BY MOUTH EVERY DAY  90 tablet 1  ? metoprolol tartrate (LOPRESSOR) 25 MG tablet TAKE 1 TABLET BY MOUTH TWICE A DAY 180 tablet 1  ? rosuvastatin (CRESTOR) 10 MG tablet TAKE 1 TABLET BY MOUTH EVERY DAY 90 tablet 1  ? ?No current facility-administered medications on file prior to visit.  ? ? ? ?ROS see history of present illness ? ?Objective ? ?Physical Exam ?Vitals:  ? 06/10/21 1040  ?BP: 120/70  ?Pulse: 67  ?Temp: 98.7 ?F (37.1 ?C)  ?SpO2: 98%  ? ? ?BP Readings from Last 3 Encounters:  ?06/10/21 120/70  ?03/11/21 118/80  ?01/31/21 130/80  ? ?Wt Readings from Last 3 Encounters:  ?06/10/21 177 lb (80.3 kg)  ?03/11/21 176 lb 12.8 oz (80.2 kg)  ?01/31/21 176 lb 3.2 oz (79.9 kg)  ? ? ?Physical Exam ?Constitutional:   ?   General: She is not in acute distress. ?   Appearance: She is not diaphoretic.  ?Cardiovascular:  ?   Rate and Rhythm: Normal rate and regular rhythm.  ?   Heart sounds: Normal heart sounds.  ?Pulmonary:  ?   Effort: Pulmonary effort is normal.  ?   Breath sounds: Normal breath sounds.  ?Musculoskeletal:  ?   Right lower leg: No edema.  ?   Left lower leg: No edema.  ?Skin: ?   General: Skin is warm and dry.  ?Neurological:  ?   Mental Status: She is alert.  ? ?  Diabetic Foot Exam - Simple   ?Simple Foot Form ?Diabetic Foot exam was performed with the following findings: Yes 06/10/2021 10:48 AM  ?Visual Inspection ?No deformities, no ulcerations, no other skin breakdown bilaterally: Yes ?Sensation Testing ?Intact to touch and monofilament testing bilaterally: Yes ?Pulse Check ?Posterior Tibialis and Dorsalis pulse intact bilaterally: Yes ?Comments ?  ? ? ? ?Assessment/Plan: Please see individual problem list. ? ?Problem List Items Addressed This Visit   ? ? Diabetes (HCC) - Primary (Chronic)  ?  Well-controlled on A1c.  She will continue Trulicity 4.5 mg once weekly and Jardiance 25 mg daily. ?  ?  ? Relevant Orders  ? POCT HgB A1C (Completed)  ? Hyperlipidemia  ?  Continue Crestor 10 mg daily. ?  ?  ? Hypothyroidism  ?  Check  TSH.  Continue Synthroid 75 mcg once daily. ?  ?  ? Relevant Orders  ? TSH  ? Renovascular hypertension  ?  Well-controlled.  She will continue amlodipine 5 mg once daily, Lasix 20 mg daily, hydralazine 25 mg 3 times daily, lisinopril 40 mg daily, and metoprolol 25 mg twice daily. ?  ?  ? ? ?Return in about 6 months (around 12/11/2021) for Diabetes/hypertension. ? ?This visit occurred during the SARS-CoV-2 public health emergency.  Safety protocols were in place, including screening questions prior to the visit, additional usage of staff PPE, and extensive cleaning of exam room while observing appropriate contact time as indicated for disinfecting solutions.  ? ? ?Marikay Alar, MD ?[facility] [place] ? ?

## 2021-06-10 NOTE — Assessment & Plan Note (Signed)
Well-controlled on A1c.  She will continue Trulicity 4.5 mg once weekly and Jardiance 25 mg daily. ?

## 2021-06-10 NOTE — Patient Instructions (Signed)
Nice to see you. ?We will have you continue your current dose of Trulicity and Jardiance. ?

## 2021-06-10 NOTE — Assessment & Plan Note (Signed)
>>  ASSESSMENT AND PLAN FOR CONTROLLED DIABETES MELLITUS TYPE 2 WITH COMPLICATIONS (HCC) WRITTEN ON 06/10/2021 10:50 AM BY SONNENBERG, ERIC G, MD  Well-controlled on A1c.  She will continue Trulicity  4.5 mg once weekly and Jardiance  25 mg daily.

## 2021-06-10 NOTE — Assessment & Plan Note (Signed)
Continue Crestor 10 mg daily. 

## 2021-06-10 NOTE — Assessment & Plan Note (Signed)
Well-controlled.  She will continue amlodipine 5 mg once daily, Lasix 20 mg daily, hydralazine 25 mg 3 times daily, lisinopril 40 mg daily, and metoprolol 25 mg twice daily. ?

## 2021-06-27 ENCOUNTER — Telehealth: Payer: Self-pay | Admitting: Family Medicine

## 2021-06-27 NOTE — Telephone Encounter (Signed)
Spoke with patient she request a CB 06/28/21 ?

## 2021-06-29 ENCOUNTER — Other Ambulatory Visit: Payer: Self-pay | Admitting: Family Medicine

## 2021-07-19 ENCOUNTER — Other Ambulatory Visit: Payer: Self-pay | Admitting: Family Medicine

## 2021-07-25 ENCOUNTER — Telehealth: Payer: PPO

## 2021-07-28 ENCOUNTER — Other Ambulatory Visit: Payer: Self-pay | Admitting: Family Medicine

## 2021-07-28 DIAGNOSIS — I150 Renovascular hypertension: Secondary | ICD-10-CM

## 2021-08-28 ENCOUNTER — Other Ambulatory Visit: Payer: Self-pay | Admitting: Family

## 2021-09-07 ENCOUNTER — Other Ambulatory Visit: Payer: Self-pay | Admitting: Family Medicine

## 2021-09-12 ENCOUNTER — Ambulatory Visit (INDEPENDENT_AMBULATORY_CARE_PROVIDER_SITE_OTHER): Payer: PPO

## 2021-09-12 VITALS — Ht 63.0 in | Wt 177.0 lb

## 2021-09-12 DIAGNOSIS — Z0000 Encounter for general adult medical examination without abnormal findings: Secondary | ICD-10-CM | POA: Diagnosis not present

## 2021-09-12 NOTE — Patient Instructions (Addendum)
  Ms. Eddleman , Thank you for taking time to come for your Medicare Wellness Visit. I appreciate your ongoing commitment to your health goals. Please review the following plan we discussed and let me know if I can assist you in the future.   These are the goals we discussed:  Goals       Patient Stated     Healthy Lifestyle (pt-stated)      Use treadmill more for exercise Eat a healthier diet         This is a list of the screening recommended for you and due dates:  Health Maintenance  Topic Date Due   COVID-19 Vaccine (4 - Pfizer series) 09/28/2021*   Zoster (Shingles) Vaccine (1 of 2) 12/13/2021*   Colon Cancer Screening  01/31/2022*   Flu Shot  10/25/2021   Hemoglobin A1C  12/11/2021   Eye exam for diabetics  04/28/2022   Complete foot exam   06/11/2022   Tetanus Vaccine  12/31/2027   Pneumonia Vaccine  Completed   Hepatitis C Screening: USPSTF Recommendation to screen - Ages 18-79 yo.  Completed   HPV Vaccine  Aged Out   Mammogram  Discontinued   DEXA scan (bone density measurement)  Discontinued  *Topic was postponed. The date shown is not the original due date.

## 2021-09-12 NOTE — Progress Notes (Signed)
Subjective:   Danielle Park is a 71 y.o. female who presents for Medicare Annual (Subsequent) preventive examination.  Review of Systems    No ROS.  Medicare Wellness Virtual Visit.  Visual/audio telehealth visit, UTA vital signs.   See social history for additional risk factors.   Cardiac Risk Factors include: advanced age (>22men, >50 women);diabetes mellitus     Objective:    Today's Vitals   09/12/21 1307  Weight: 177 lb (80.3 kg)  Height: 5\' 3"  (1.6 m)   Body mass index is 31.35 kg/m.     09/12/2021    1:04 PM 05/27/2020    1:18 PM 09/26/2018   10:40 AM 08/21/2017    4:47 PM 01/12/2017    8:39 AM 07/13/2016   10:13 AM 12/31/2015    9:33 AM  Advanced Directives  Does Patient Have a Medical Advance Directive? No No No No No Yes No  Type of Advance Directive      Healthcare Power of Attorney   Would patient like information on creating a medical advance directive? No - Patient declined No - Patient declined No - Patient declined No - Patient declined       Current Medications (verified) Outpatient Encounter Medications as of 09/12/2021  Medication Sig   amLODipine (NORVASC) 5 MG tablet TAKE 1 TABLET (5 MG TOTAL) BY MOUTH DAILY.   aspirin 81 MG tablet Take 81 mg by mouth daily.   baclofen (LIORESAL) 10 MG tablet TAKE 1/2 TABLET BY MOUTH 3 TIMES DAILY AS NEEDED FOR MUSCLE SPASMS.   BD PEN NEEDLE NANO 2ND GEN 32G X 4 MM MISC USE 1 PEN NEEDLE DAILY AS DIRECTED   cholecalciferol (VITAMIN D3) 25 MCG (1000 UT) tablet Take 2,000 Units by mouth daily.   clopidogrel (PLAVIX) 75 MG tablet TAKE 1 TABLET BY MOUTH EVERY DAY   Dulaglutide (TRULICITY) 4.5 0000000 SOPN Inject 4.5 mg as directed once a week.   empagliflozin (JARDIANCE) 25 MG TABS tablet Take 1 tablet (25 mg total) by mouth daily before breakfast.   furosemide (LASIX) 20 MG tablet TAKE 1 TABLET BY MOUTH EVERY DAY   hydrALAZINE (APRESOLINE) 25 MG tablet TAKE 1 TABLET BY MOUTH THREE TIMES A DAY   levothyroxine (SYNTHROID)  75 MCG tablet TAKE ONE TABLET BY MOUTH ONCE DAILY BEFORE BREAKFAST   lisinopril (ZESTRIL) 40 MG tablet TAKE 1 TABLET BY MOUTH EVERY DAY   metoprolol tartrate (LOPRESSOR) 25 MG tablet TAKE 1 TABLET BY MOUTH TWICE A DAY   rosuvastatin (CRESTOR) 10 MG tablet TAKE 1 TABLET BY MOUTH EVERY DAY   No facility-administered encounter medications on file as of 09/12/2021.    Allergies (verified) Patient has no known allergies.   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   Congenital single kidney    Diabetes mellitus    Hypertension    UTI (urinary tract infection)    Past Surgical History:  Procedure Laterality Date   CESAREAN SECTION     PERIPHERAL VASCULAR CATHETERIZATION N/A 01/11/2015   Procedure: Abdominal Aortogram w/Lower Extremity;  Surgeon: Algernon Huxley, MD;  Location: [place] [facility];  Service: Cardiovascular;  Laterality: N/A;   PERIPHERAL VASCULAR CATHETERIZATION  01/11/2015   Procedure: Lower Extremity Intervention;  Surgeon: Algernon Huxley, MD;  Location: [place] [facility];  Service: Cardiovascular;;   RENAL ARTERY STENT  2014   Dr. Lucky Cowboy at [facility]   VAGINAL DELIVERY     Family History  Problem Relation Age of Onset   Hypertension Mother    Diabetes Mother    Heart disease Mother        s/p CABG x 2   Hypertension Father    Cancer Father        lung    Diabetes Sister    Heart disease Sister    Heart disease Sister    Cancer - Lung Brother    Cancer Sister        lung   Heart disease Other    Diabetes Other    Social History   Socioeconomic History   Marital status: Married    Spouse name: Not on file   Number of children: Not on file   Years of education: Not on file   Highest education level: Not on file  Occupational History   Not on file  Tobacco Use   Smoking status: Never   Smokeless tobacco: Never  Substance and Sexual Activity   Alcohol  use: No    Alcohol/week: 0.0 standard drinks of alcohol   Drug use: No   Sexual activity: Not on file  Other Topics Concern   Not on file  Social History Narrative   Lives in [place] with husband. 1 dog      Work - Administrator, Civil Service   Diet - regular diet   Exercise - occasional, very active at work   International aid/development worker of Corporate investment banker Strain: Low Risk  (09/12/2021)   Overall Financial Resource Strain (CARDIA)    Difficulty of Paying Living Expenses: Not hard at all  Food Insecurity: No Food Insecurity (09/12/2021)   Hunger Vital Sign    Worried About Running Out of Food in the Last Year: Never true    Ran Out of Food in the Last Year: Never true  Transportation Needs: No Transportation Needs (09/12/2021)   PRAPARE - Administrator, Civil Service (Medical): No    Lack of Transportation (Non-Medical): No  Physical Activity: Sufficiently Active (09/12/2021)   Exercise Vital Sign    Days of Exercise per Week: 7 days    Minutes of Exercise per Session: 30 min  Stress: No Stress Concern Present (09/12/2021)   [employer] of Occupational Health - Occupational Stress Questionnaire    Feeling of Stress : Not at all  Social Connections: Unknown (09/12/2021)   Social Connection and Isolation Panel [NHANES]    Frequency of Communication with Friends and Family: Not on file    Frequency of Social Gatherings with Friends and Family: Not on file    Attends Religious Services: Not on file    Active Member of Clubs or Organizations: Not on file    Attends Banker Meetings: Not on file    Marital Status: Married    Tobacco Counseling Counseling given: Not Answered   Clinical Intake:  Pre-visit preparation completed: Yes        Diabetes: Yes (Followed by PCP)  How often do you need to have someone help you when you read instructions, pamphlets, or other written materials from your doctor or pharmacy?: 1 - Never  Interpreter Needed?: No       Activities of Daily Living    09/12/2021    1:11 PM  In your present state of health, do you have any difficulty performing the following activities:  Hearing? 0  Vision? 0  Difficulty concentrating or  making decisions? 0  Walking or climbing stairs? 0  Dressing or bathing? 0  Doing errands, shopping? 0  Preparing Food and eating ? N  Using the Toilet? N  In the past six months, have you accidently leaked urine? N  Do you have problems with loss of bowel control? N  Managing your Medications? N  Managing your Finances? N  Housekeeping or managing your Housekeeping? N    Patient Care Team: Leone Haven, MD as PCP - General (Family Medicine)  Indicate any recent Medical Services you may have received from other than [facility] providers in the past year (date may be approximate).     Assessment:   This is a routine wellness examination for Kemia.  Virtual Visit via Telephone Note  I connected with  Orlene Erm on 09/12/21 at  1:00 PM EDT by telephone and verified that I am speaking with the correct person using two identifiers.  Persons participating in the virtual visit: patient/Nurse Health Advisor   I discussed the limitations of performing an evaluation and management service by telehealth.  We continued and completed visit with audio only. Some vital signs may be absent or patient reported.   Hearing/Vision screen Hearing Screening - Comments:: Patient is able to hear conversational tones without difficulty. No issues reported. Vision Screening - Comments:: They have seen their ophthalmologist in the last 12 months.   Dietary issues and exercise activities discussed: Current Exercise Habits: Home exercise routine, Type of exercise: treadmill, Time (Minutes): 30, Frequency (Times/Week): 7, Weekly Exercise (Minutes/Week): 210, Intensity: Mild   Goals Addressed               This Visit's Progress     Patient Stated     Healthy Lifestyle (pt-stated)   On  track     Use treadmill more for exercise Eat a healthier diet        Depression Screen    09/12/2021    1:11 PM 06/10/2021   10:42 AM 01/31/2021   11:40 AM 07/27/2020   11:07 AM 05/27/2020    1:06 PM 01/06/2020    9:32 AM 11/04/2019   11:28 AM  PHQ 2/9 Scores  PHQ - 2 Score 0 0 0 0 0 0 0    Fall Risk    09/12/2021    1:20 PM 06/10/2021   10:42 AM 01/31/2021   11:39 AM 07/27/2020   11:07 AM 05/27/2020    1:06 PM  [place] in the past year? 0 0 0 0 0  Number falls in past yr: 0 0 0 0 0  Injury with Fall?  0 0  0  Risk for fall due to :  No Fall Risks No Fall Risks    Follow up Falls evaluation completed Falls evaluation completed Falls evaluation completed Falls evaluation completed Falls evaluation completed    [place]: Home free of loose throw rugs in walkways, pet beds, electrical cords, etc? Yes  Adequate lighting in your home to reduce risk of falls? Yes   ASSISTIVE DEVICES UTILIZED TO PREVENT FALLS: Life alert? No  Use of a cane, walker or w/c? No   TIMED UP AND GO: Was the test performed? No .   Cognitive Function:  Patient is alert and oriented x3.       09/26/2018   10:39 AM  6CIT Screen  What Year? 0 points  What month? 0 points  What time? 0 points  Count  back from 20 0 points  Months in reverse 0 points  Repeat phrase 0 points  Total Score 0 points    Immunizations Immunization History  Administered Date(s) Administered   Fluad Quad(high Dose 65+) 01/31/2021   Influenza, High Dose Seasonal PF 01/29/2017, 12/09/2017, 01/01/2020   Influenza,inj,Quad PF,6+ Mos 01/15/2019   Influenza-Unspecified 12/08/2015   PFIZER(Purple Top)SARS-COV-2 Vaccination 06/26/2019, 07/23/2019, 03/06/2020   Pneumococcal Conjugate-13 12/30/2017   Pneumococcal Polysaccharide-23 01/15/2019   Tdap 12/30/2017   Shingrix Completed?: No.    Education has been provided regarding the importance of this vaccine. Patient has been advised to  call insurance company to determine out of pocket expense if they have not yet received this vaccine. Advised may also receive vaccine at local pharmacy or Health Dept. Verbalized acceptance and understanding.  Screening Tests Health Maintenance  Topic Date Due   COVID-19 Vaccine (4 - Pfizer series) 09/28/2021 (Originally 05/01/2020)   Zoster Vaccines- Shingrix (1 of 2) 12/13/2021 (Originally 01/07/2001)   COLONOSCOPY (Pts 45-11yrs Insurance coverage will need to be confirmed)  01/31/2022 (Originally 02/07/2019)   INFLUENZA VACCINE  10/25/2021   HEMOGLOBIN A1C  12/11/2021   OPHTHALMOLOGY EXAM  04/28/2022   FOOT EXAM  06/11/2022   TETANUS/TDAP  12/31/2027   Pneumonia Vaccine 58+ Years old  Completed   Hepatitis C Screening  Completed   HPV VACCINES  Aged Out   MAMMOGRAM  Discontinued   DEXA SCAN  Discontinued    Health Maintenance  There are no preventive care reminders to display for this patient.  Lung Cancer Screening: (Low Dose CT Chest recommended if Age 85-80 years, 30 pack-year currently smoking OR have quit w/in 15years.) does not qualify.   Vision Screening: Recommended annual ophthalmology exams for early detection of glaucoma and other disorders of the eye.  Dental Screening: Recommended annual dental exams for proper oral hygiene  Community Resource Referral / Chronic Care Management: CRR required this visit?  No   CCM required this visit?  No      Plan:   Keep all routine maintenance appointments.   I have personally reviewed and noted the following in the patient's chart:   Medical and social history Use of alcohol, tobacco or illicit drugs  Current medications and supplements including opioid prescriptions.  Functional ability and status Nutritional status Physical activity Advanced directives List of other physicians Hospitalizations, surgeries, and ER visits in previous 12 months Vitals Screenings to include cognitive, depression, and falls Referrals  and appointments  In addition, I have reviewed and discussed with patient certain preventive protocols, quality metrics, and best practice recommendations. A written personalized care plan for preventive services as well as general preventive health recommendations were provided to patient.     Ashok Pall, LPN   06/18/4980

## 2021-09-26 ENCOUNTER — Other Ambulatory Visit: Payer: Self-pay | Admitting: Family Medicine

## 2021-10-14 ENCOUNTER — Other Ambulatory Visit (INDEPENDENT_AMBULATORY_CARE_PROVIDER_SITE_OTHER): Payer: Self-pay | Admitting: Nurse Practitioner

## 2021-10-14 DIAGNOSIS — I701 Atherosclerosis of renal artery: Secondary | ICD-10-CM

## 2021-10-18 ENCOUNTER — Ambulatory Visit (INDEPENDENT_AMBULATORY_CARE_PROVIDER_SITE_OTHER): Payer: PPO | Admitting: Vascular Surgery

## 2021-10-18 ENCOUNTER — Ambulatory Visit (INDEPENDENT_AMBULATORY_CARE_PROVIDER_SITE_OTHER): Payer: PPO

## 2021-10-18 ENCOUNTER — Encounter (INDEPENDENT_AMBULATORY_CARE_PROVIDER_SITE_OTHER): Payer: PPO

## 2021-10-18 ENCOUNTER — Encounter (INDEPENDENT_AMBULATORY_CARE_PROVIDER_SITE_OTHER): Payer: Self-pay | Admitting: Vascular Surgery

## 2021-10-18 VITALS — BP 165/78 | HR 62 | Resp 16 | Wt 177.8 lb

## 2021-10-18 DIAGNOSIS — I739 Peripheral vascular disease, unspecified: Secondary | ICD-10-CM

## 2021-10-18 DIAGNOSIS — I150 Renovascular hypertension: Secondary | ICD-10-CM | POA: Diagnosis not present

## 2021-10-18 DIAGNOSIS — E1122 Type 2 diabetes mellitus with diabetic chronic kidney disease: Secondary | ICD-10-CM

## 2021-10-18 DIAGNOSIS — I701 Atherosclerosis of renal artery: Secondary | ICD-10-CM

## 2021-10-18 DIAGNOSIS — Z794 Long term (current) use of insulin: Secondary | ICD-10-CM

## 2021-10-18 DIAGNOSIS — N1832 Chronic kidney disease, stage 3b: Secondary | ICD-10-CM

## 2021-10-18 DIAGNOSIS — N1831 Chronic kidney disease, stage 3a: Secondary | ICD-10-CM

## 2021-10-18 NOTE — Assessment & Plan Note (Signed)
Duplex today shows good flow through the right renal artery with no hemodynamically significant stenosis and a stable nearly 13 cm kidney length.  The left kidney is absent.  We will continue to follow this on an annual basis.  Contact our office with problems.

## 2021-10-18 NOTE — Assessment & Plan Note (Signed)
ABIs today are 1.11 on the right and 1.09 on the left with triphasic waveforms and normal digital pressures consistent with no arterial insufficiency.  She is about 7 years status post intervention and doing well.  No worrisome symptoms currently.  Continue current medical regimen.  Recheck in 1 year.

## 2021-10-18 NOTE — Progress Notes (Signed)
MRN : [id number]  Danielle Park is a 71 y.o. ([birth year]) female who presents with chief complaint of  Chief Complaint  Patient presents with   Follow-up    Ultrasound follow up  .  History of Present Illness: Patient returns today in follow up of multiple vascular issues.  She is doing well today without obvious complaints.  She denies any ischemic rest pain, ulceration, or disabling claudication symptoms. ABIs today are 1.11 on the right and 1.09 on the left with triphasic waveforms and normal digital pressures consistent with no arterial insufficiency. She also follows up for renal artery stenosis.  She is status post renal artery intervention many years ago with a known solitary right kidney.  At that time, she had severe hypertension and chronic kidney disease.  Her kidney disease has been stable.  Her blood pressure has been much better.  Duplex today shows good flow through the right renal artery with no hemodynamically significant stenosis and a stable nearly 13 cm kidney length.  The left kidney is absent.  Current Outpatient Medications  Medication Sig Dispense Refill   amLODipine (NORVASC) 5 MG tablet TAKE 1 TABLET (5 MG TOTAL) BY MOUTH DAILY. 90 tablet 1   aspirin 81 MG tablet Take 81 mg by mouth daily.     baclofen (LIORESAL) 10 MG tablet TAKE 1/2 TABLET BY MOUTH 3 TIMES DAILY AS NEEDED FOR MUSCLE SPASMS. 10 tablet 0   BD PEN NEEDLE NANO 2ND GEN 32G X 4 MM MISC USE 1 PEN NEEDLE DAILY AS DIRECTED 100 each 3   cholecalciferol (VITAMIN D3) 25 MCG (1000 UT) tablet Take 2,000 Units by mouth daily.     clopidogrel (PLAVIX) 75 MG tablet TAKE 1 TABLET BY MOUTH EVERY DAY 90 tablet 2   Dulaglutide (TRULICITY) 4.5 MG/0.5ML SOPN Inject 4.5 mg as directed once a week. 12 mL 2   empagliflozin (JARDIANCE) 25 MG TABS tablet Take 1 tablet (25 mg total) by mouth daily before breakfast. 90 tablet 3   furosemide (LASIX) 20 MG tablet TAKE 1 TABLET BY MOUTH EVERY DAY 90 tablet 1   hydrALAZINE  (APRESOLINE) 25 MG tablet TAKE 1 TABLET BY MOUTH THREE TIMES A DAY 270 tablet 1   levothyroxine (SYNTHROID) 75 MCG tablet TAKE ONE TABLET BY MOUTH ONCE DAILY BEFORE BREAKFAST 90 tablet 0   lisinopril (ZESTRIL) 40 MG tablet TAKE 1 TABLET BY MOUTH EVERY DAY 90 tablet 1   metoprolol tartrate (LOPRESSOR) 25 MG tablet TAKE 1 TABLET BY MOUTH TWICE A DAY 180 tablet 1   rosuvastatin (CRESTOR) 10 MG tablet TAKE 1 TABLET BY MOUTH EVERY DAY 90 tablet 1   No current facility-administered medications for this visit.    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   Congenital single kidney    Diabetes mellitus    Hypertension    UTI (urinary tract infection)     Past Surgical History:  Procedure Laterality Date   CESAREAN SECTION     PERIPHERAL VASCULAR CATHETERIZATION N/A 01/11/2015   Procedure: Abdominal Aortogram w/Lower Extremity;  Surgeon: Annice NeedyJason S Sebrena Engh, MD;  Location: [facility];  Service: Cardiovascular;  Laterality: N/A;   PERIPHERAL VASCULAR CATHETERIZATION  01/11/2015   Procedure: Lower Extremity Intervention;  Surgeon: Annice NeedyJason S Vertie Dibbern, MD;  Location: [facility];  Service: Cardiovascular;;   RENAL ARTERY STENT  2014   Dr. Wyn Quakerew at  [facility]   VAGINAL DELIVERY       Social History   Tobacco Use   Smoking status: Never   Smokeless tobacco: Never  Substance Use Topics   Alcohol use: No    Alcohol/week: 0.0 standard drinks of alcohol   Drug use: No      Family History  Problem Relation Age of Onset   Hypertension Mother    Diabetes Mother    Heart disease Mother        s/p CABG x 2   Hypertension Father    Cancer Father        lung    Diabetes Sister    Heart disease Sister    Heart disease Sister    Cancer - Lung Brother    Cancer Sister        lung   Heart disease Other    Diabetes Other      No Known Allergies  REVIEW OF SYSTEMS (Negative unless checked)    Constitutional: [] Weight loss  [] Fever  [] Chills Cardiac: [] Chest pain   [] Chest pressure   [] Palpitations   [] Shortness of breath when laying flat   [] Shortness of breath at rest   [] Shortness of breath with exertion. Vascular:  [x] Pain in legs with walking   [] Pain in legs at rest   [] Pain in legs when laying flat   [x] Claudication   [] Pain in feet when walking  [] Pain in feet at rest  [] Pain in feet when laying flat   [] History of DVT   [] Phlebitis   [] Swelling in legs   [] Varicose veins   [] Non-healing ulcers Pulmonary:   [] Uses home oxygen   [] Productive cough   [] Hemoptysis   [] Wheeze  [] COPD   [] Asthma Neurologic:  [] Dizziness  [] Blackouts   [] Seizures   [] History of stroke   [] History of TIA  [] Aphasia   [] Temporary blindness   [] Dysphagia   [] Weakness or numbness in arms   [] Weakness or numbness in legs Musculoskeletal:  [x] Arthritis   [] Joint swelling   [] Joint pain   [] Low back pain Hematologic:  [] Easy bruising  [] Easy bleeding   [] Hypercoagulable state   [] Anemic   Gastrointestinal:  [] Blood in stool   [] Vomiting blood  [] Gastroesophageal reflux/heartburn   [] Abdominal pain Genitourinary:  [x] Chronic kidney disease   [] Difficult urination  [] Frequent urination  [] Burning with urination   [x] Hematuria Skin:  [] Rashes   [] Ulcers   [] Wounds Psychological:  [] History of anxiety   []  History of major depression.  Physical Examination  BP (!) 165/78 (BP Location: Right Arm)   Pulse 62   Resp 16   Wt 177 lb 12.8 oz (80.6 kg)   BMI 31.50 kg/m  Gen:  WD/WN, NAD Head: [place]/AT, No temporalis wasting. Ear/Nose/Throat: Hearing grossly intact, nares w/o erythema or drainage Eyes: Conjunctiva clear. Sclera non-icteric Neck: Supple.  Trachea midline Pulmonary:  Good air movement, no use of accessory muscles.  Cardiac: RRR, no JVD Vascular:  Vessel Right Left  Radial Palpable Palpable                          PT Palpable Palpable  DP Palpable Palpable   Gastrointestinal: soft,  non-tender/non-distended. No guarding/reflex.  Musculoskeletal: M/S 5/5 throughout.  No deformity or atrophy. No edema. Neurologic: Sensation grossly intact in extremities.  Symmetrical.  Speech is fluent.  Psychiatric: Judgment intact, Mood & affect appropriate for pt's clinical situation. Dermatologic: No rashes or ulcers noted.  No cellulitis  or open wounds.     Labs No results found for this or any previous visit (from the past 2160 hour(s)).  Radiology No results found.  Assessment/Plan Diabetes (HCC) blood glucose control important in reducing the progression of atherosclerotic disease. Also, involved in wound healing. On appropriate medications.     CKD (chronic kidney disease), stage III (HCC) Recently saw her nephrologist.  Last creatinine clearance was approximately 33.  Avoid dehydration.  Continue to monitor  PVD (peripheral vascular disease) (HCC) ABIs today are 1.11 on the right and 1.09 on the left with triphasic waveforms and normal digital pressures consistent with no arterial insufficiency.  She is about 7 years status post intervention and doing well.  No worrisome symptoms currently.  Continue current medical regimen.  Recheck in 1 year.  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  Renal artery stenosis (HCC) Duplex today shows good flow through the right renal artery with no hemodynamically significant stenosis and a stable nearly 13 cm kidney length.  The left kidney is absent.  We will continue to follow this on an annual basis.  Contact our office with problems.    Festus Barren, MD  10/18/2021 9:07 AM    This note was created with Dragon medical transcription system.  Any errors from dictation are purely unintentional

## 2021-10-18 NOTE — Assessment & Plan Note (Signed)
Blood pressure control much improved after previous intervention.

## 2021-10-27 DIAGNOSIS — H2513 Age-related nuclear cataract, bilateral: Secondary | ICD-10-CM | POA: Diagnosis not present

## 2021-10-27 DIAGNOSIS — E113393 Type 2 diabetes mellitus with moderate nonproliferative diabetic retinopathy without macular edema, bilateral: Secondary | ICD-10-CM | POA: Diagnosis not present

## 2021-10-27 LAB — HM DIABETES EYE EXAM

## 2021-12-12 ENCOUNTER — Ambulatory Visit (INDEPENDENT_AMBULATORY_CARE_PROVIDER_SITE_OTHER): Payer: PPO | Admitting: Family Medicine

## 2021-12-12 ENCOUNTER — Encounter: Payer: Self-pay | Admitting: Family Medicine

## 2021-12-12 DIAGNOSIS — G8929 Other chronic pain: Secondary | ICD-10-CM

## 2021-12-12 DIAGNOSIS — E1122 Type 2 diabetes mellitus with diabetic chronic kidney disease: Secondary | ICD-10-CM

## 2021-12-12 DIAGNOSIS — I150 Renovascular hypertension: Secondary | ICD-10-CM | POA: Diagnosis not present

## 2021-12-12 DIAGNOSIS — Z794 Long term (current) use of insulin: Secondary | ICD-10-CM

## 2021-12-12 DIAGNOSIS — M546 Pain in thoracic spine: Secondary | ICD-10-CM | POA: Diagnosis not present

## 2021-12-12 DIAGNOSIS — E785 Hyperlipidemia, unspecified: Secondary | ICD-10-CM | POA: Diagnosis not present

## 2021-12-12 DIAGNOSIS — N1831 Chronic kidney disease, stage 3a: Secondary | ICD-10-CM | POA: Diagnosis not present

## 2021-12-12 LAB — COMPREHENSIVE METABOLIC PANEL
ALT: 7 U/L (ref 0–35)
AST: 16 U/L (ref 0–37)
Albumin: 3.9 g/dL (ref 3.5–5.2)
Alkaline Phosphatase: 94 U/L (ref 39–117)
BUN: 16 mg/dL (ref 6–23)
CO2: 24 mEq/L (ref 19–32)
Calcium: 9.1 mg/dL (ref 8.4–10.5)
Chloride: 104 mEq/L (ref 96–112)
Creatinine, Ser: 1.26 mg/dL — ABNORMAL HIGH (ref 0.40–1.20)
GFR: 43.13 mL/min — ABNORMAL LOW (ref >60.00)
Glucose, Bld: 137 mg/dL — ABNORMAL HIGH (ref 70–99)
Potassium: 3.8 mEq/L (ref 3.5–5.1)
Sodium: 136 mEq/L (ref 135–145)
Total Bilirubin: 0.5 mg/dL (ref 0.2–1.2)
Total Protein: 7.1 g/dL (ref 6.0–8.3)

## 2021-12-12 LAB — LIPID PANEL
Cholesterol: 128 mg/dL (ref 0–200)
HDL: 49.3 mg/dL (ref >39.00)
LDL Cholesterol: 54 mg/dL (ref 0–99)
NonHDL: 78.52
Total CHOL/HDL Ratio: 3
Triglycerides: 124 mg/dL (ref 0.0–149.0)
VLDL: 24.8 mg/dL (ref 0.0–40.0)

## 2021-12-12 LAB — HEMOGLOBIN A1C: Hgb A1c MFr Bld: 6.8 % — ABNORMAL HIGH (ref 4.6–6.5)

## 2021-12-12 NOTE — Assessment & Plan Note (Signed)
Chronic issue.  Flare earlier this month.  Patient does not think she did have time to do physical therapy.  She was given home exercises to do.  If needed we could refer for physical therapy in the future.

## 2021-12-12 NOTE — Patient Instructions (Signed)
Back Exercises These exercises help to make your trunk and back strong. They also help to keep the lower back flexible. Doing these exercises can help to prevent or lessen pain in your lower back. If you have back pain, try to do these exercises 2-3 times each day or as told by your doctor. As you get better, do the exercises once each day. Repeat the exercises more often as told by your doctor. To stop back pain from coming back, do the exercises once each day, or as told by your doctor. Do exercises exactly as told by your doctor. Stop right away if you feel sudden pain or your pain gets worse. Exercises Single knee to chest Do these steps 3-5 times in a row for each leg: Lie on your back on a firm bed or the floor with your legs stretched out. Bring one knee to your chest. Grab your knee or thigh with both hands and hold it in place. Pull on your knee until you feel a gentle stretch in your lower back or butt. Keep doing the stretch for 10-30 seconds. Slowly let go of your leg and straighten it. Pelvic tilt Do these steps 5-10 times in a row: Lie on your back on a firm bed or the floor with your legs stretched out. Bend your knees so they point up to the ceiling. Your feet should be flat on the floor. Tighten your lower belly (abdomen) muscles to press your lower back against the floor. This will make your tailbone point up to the ceiling instead of pointing down to your feet or the floor. Stay in this position for 5-10 seconds while you gently tighten your muscles and breathe evenly. Cat-cow Do these steps until your lower back bends more easily: Get on your hands and knees on a firm bed or the floor. Keep your hands under your shoulders, and keep your knees under your hips. You may put padding under your knees. Let your head hang down toward your chest. Tighten (contract) the muscles in your belly. Point your tailbone toward the floor so your lower back becomes rounded like the back of a  cat. Stay in this position for 5 seconds. Slowly lift your head. Let the muscles of your belly relax. Point your tailbone up toward the ceiling so your back forms a sagging arch like the back of a cow. Stay in this position for 5 seconds.  Press-ups Do these steps 5-10 times in a row: Lie on your belly (face-down) on a firm bed or the floor. Place your hands near your head, about shoulder-width apart. While you keep your back relaxed and keep your hips on the floor, slowly straighten your arms to raise the top half of your body and lift your shoulders. Do not use your back muscles. You may change where you place your hands to make yourself more comfortable. Stay in this position for 5 seconds. Keep your back relaxed. Slowly return to lying flat on the floor.  Bridges Do these steps 10 times in a row: Lie on your back on a firm bed or the floor. Bend your knees so they point up to the ceiling. Your feet should be flat on the floor. Your arms should be flat at your sides, next to your body. Tighten your butt muscles and lift your butt off the floor until your waist is almost as high as your knees. If you do not feel the muscles working in your butt and the back of   your thighs, slide your feet 1-2 inches (2.5-5 cm) farther away from your butt. Stay in this position for 3-5 seconds. Slowly lower your butt to the floor, and let your butt muscles relax. If this exercise is too easy, try doing it with your arms crossed over your chest. Belly crunches Do these steps 5-10 times in a row: Lie on your back on a firm bed or the floor with your legs stretched out. Bend your knees so they point up to the ceiling. Your feet should be flat on the floor. Cross your arms over your chest. Tip your chin a little bit toward your chest, but do not bend your neck. Tighten your belly muscles and slowly raise your chest just enough to lift your shoulder blades a tiny bit off the floor. Avoid raising your body  higher than that because it can put too much stress on your lower back. Slowly lower your chest and your head to the floor. Back lifts Do these steps 5-10 times in a row: Lie on your belly (face-down) with your arms at your sides, and rest your forehead on the floor. Tighten the muscles in your legs and your butt. Slowly lift your chest off the floor while you keep your hips on the floor. Keep the back of your head in line with the curve in your back. Look at the floor while you do this. Stay in this position for 3-5 seconds. Slowly lower your chest and your face to the floor. Contact a doctor if: Your back pain gets a lot worse when you do an exercise. Your back pain does not get better within 2 hours after you exercise. If you have any of these problems, stop doing the exercises. Do not do them again unless your doctor says it is okay. Get help right away if: You have sudden, very bad back pain. If this happens, stop doing the exercises. Do not do them again unless your doctor says it is okay. This information is not intended to replace advice given to you by your health care provider. Make sure you discuss any questions you have with your health care provider. Document Revised: 05/26/2020 Document Reviewed: 05/26/2020 Elsevier Patient Education  2023 [employer]vier Inc.  

## 2021-12-12 NOTE — Assessment & Plan Note (Signed)
Check A1c.  Continue Jardiance 25 mg daily and Trulicity 4.5 mg weekly.

## 2021-12-12 NOTE — Assessment & Plan Note (Signed)
Well-controlled.  She will continue lisinopril 40 mg daily, metoprolol 25 mg daily, Lasix 20 mg daily, hydralazine 25 mg 3 times daily, and amlodipine 5 mg daily.  Check labs.

## 2021-12-12 NOTE — Progress Notes (Signed)
Tommi Rumps, MD Phone: [phone]  Danielle Park is a 71 y.o. female who presents today for f/u.  DIABETES Disease Monitoring: Blood Sugar ranges-95-110 fasting, notes it is generally good 2 hours post prandial Polyuria/phagia/dipsia- no      Optho- UTD Medications: Compliance- taking trulicity, jardiance Hypoglycemic symptoms- no  HYPERTENSION Disease Monitoring Home BP Monitoring 110-120/60s Chest pain- no    Dyspnea- no Medications Compliance-  taking amlodipine, lasix, hydralazine, lisinopril, metoprolol  Edema- no BMET    Component Value Date/Time   NA 136 07/27/2020 1125   NA 137 07/29/2012 1307   K 4.4 07/27/2020 1125   K 4.6 07/29/2012 1307   CL 104 07/27/2020 1125   CL 104 07/29/2012 1307   CO2 24 07/27/2020 1125   CO2 28 07/29/2012 1307   GLUCOSE 116 (H) 07/27/2020 1125   GLUCOSE 218 (H) 07/29/2012 1307   BUN 25 (H) 07/27/2020 1125   BUN 13 07/29/2012 1307   CREATININE 1.41 (H) 07/27/2020 1125   CREATININE 1.06 (H) 04/05/2018 1635   CALCIUM 9.6 07/27/2020 1125   CALCIUM 9.4 07/29/2012 1307   GFRNONAA >60 03/06/2015 1151   GFRNONAA >60 07/29/2012 1307   GFRAA >60 03/06/2015 1151   GFRAA >60 07/29/2012 1307   HYPERLIPIDEMIA Symptoms Chest pain on exertion:  no  Medications: Compliance- taking Crestor right upper quadrant pain- no  Muscle aches- no Lipid Panel     Component Value Date/Time   CHOL 129 07/27/2020 1125   TRIG 83.0 07/27/2020 1125   HDL 51.70 07/27/2020 1125   CHOLHDL 3 07/27/2020 1125   VLDL 16.6 07/27/2020 1125   LDLCALC 61 07/27/2020 1125   LDLDIRECT 59.0 10/02/2018 0909   Thoracic back pain: Patient notes she had an episode of back pain earlier this month.  She notes her back spasms.  Notes she is back to her baseline.   Social History   Tobacco Use  Smoking Status Never  Smokeless Tobacco Never    Current Outpatient Medications on File Prior to Visit  Medication Sig Dispense Refill   amLODipine (NORVASC) 5 MG tablet  TAKE 1 TABLET (5 MG TOTAL) BY MOUTH DAILY. 90 tablet 1   aspirin 81 MG tablet Take 81 mg by mouth daily.     baclofen (LIORESAL) 10 MG tablet TAKE 1/2 TABLET BY MOUTH 3 TIMES DAILY AS NEEDED FOR MUSCLE SPASMS. 10 tablet 0   BD PEN NEEDLE NANO 2ND GEN 32G X 4 MM MISC USE 1 PEN NEEDLE DAILY AS DIRECTED 100 each 3   cholecalciferol (VITAMIN D3) 25 MCG (1000 UT) tablet Take 2,000 Units by mouth daily.     clopidogrel (PLAVIX) 75 MG tablet TAKE 1 TABLET BY MOUTH EVERY DAY 90 tablet 2   Dulaglutide (TRULICITY) 4.5 GH/8.2XH SOPN Inject 4.5 mg as directed once a week. 12 mL 2   empagliflozin (JARDIANCE) 25 MG TABS tablet Take 1 tablet (25 mg total) by mouth daily before breakfast. 90 tablet 3   furosemide (LASIX) 20 MG tablet TAKE 1 TABLET BY MOUTH EVERY DAY 90 tablet 1   hydrALAZINE (APRESOLINE) 25 MG tablet TAKE 1 TABLET BY MOUTH THREE TIMES A DAY 270 tablet 1   levothyroxine (SYNTHROID) 75 MCG tablet TAKE ONE TABLET BY MOUTH ONCE DAILY BEFORE BREAKFAST 90 tablet 0   lisinopril (ZESTRIL) 40 MG tablet TAKE 1 TABLET BY MOUTH EVERY DAY 90 tablet 1   metoprolol tartrate (LOPRESSOR) 25 MG tablet TAKE 1 TABLET BY MOUTH TWICE A DAY 180 tablet 1   rosuvastatin (  CRESTOR) 10 MG tablet TAKE 1 TABLET BY MOUTH EVERY DAY 90 tablet 1   No current facility-administered medications on file prior to visit.     ROS see history of present illness  Objective  Physical Exam Vitals:   12/12/21 1041  BP: (!) 140/70  Pulse: 70  Temp: 98.7 F (37.1 C)  SpO2: 95%    BP Readings from Last 3 Encounters:  12/12/21 (!) 140/70  10/18/21 (!) 165/78  06/10/21 120/70   Wt Readings from Last 3 Encounters:  12/12/21 178 lb 6.4 oz (80.9 kg)  10/18/21 177 lb 12.8 oz (80.6 kg)  09/12/21 177 lb (80.3 kg)    Physical Exam Constitutional:      General: She is not in acute distress.    Appearance: She is not diaphoretic.  Cardiovascular:     Rate and Rhythm: Normal rate and regular rhythm.     Heart sounds: Normal  heart sounds.  Pulmonary:     Effort: Pulmonary effort is normal.     Breath sounds: Normal breath sounds.  Musculoskeletal:     Right lower leg: No edema.     Left lower leg: No edema.  Skin:    General: Skin is warm and dry.  Neurological:     Mental Status: She is alert.      Assessment/Plan: Please see individual problem list.  Problem List Items Addressed This Visit     Diabetes ([place]) (Chronic)    Check A1c.  Continue Jardiance 25 mg daily and Trulicity 4.5 mg weekly.      Relevant Orders   Comp Met (CMET)   Lipid panel   HgB A1c   Hyperlipidemia (Chronic)    Check lipid panel.  Continue Crestor 10 mg daily.      Relevant Orders   Comp Met (CMET)   Lipid panel   Renovascular hypertension (Chronic)    Well-controlled.  She will continue lisinopril 40 mg daily, metoprolol 25 mg daily, Lasix 20 mg daily, hydralazine 25 mg 3 times daily, and amlodipine 5 mg daily.  Check labs.      Relevant Orders   Comp Met (CMET)   Thoracic back pain (Chronic)    Chronic issue.  Flare earlier this month.  Patient does not think she did have time to do physical therapy.  She was given home exercises to do.  If needed we could refer for physical therapy in the future.       Return in about 6 months (around 06/12/2022).   Tommi Rumps, MD [place]

## 2021-12-12 NOTE — Assessment & Plan Note (Signed)
>>  ASSESSMENT AND PLAN FOR CONTROLLED DIABETES MELLITUS TYPE 2 WITH COMPLICATIONS (HCC) WRITTEN ON 12/12/2021 10:50 AM BY SONNENBERG, ERIC G, MD  Check A1c.  Continue Jardiance  25 mg daily and Trulicity  4.5 mg weekly.

## 2021-12-12 NOTE — Assessment & Plan Note (Signed)
Check lipid panel.  Continue Crestor 10 mg daily. 

## 2021-12-13 ENCOUNTER — Other Ambulatory Visit: Payer: Self-pay | Admitting: Family Medicine

## 2021-12-21 ENCOUNTER — Other Ambulatory Visit: Payer: Self-pay | Admitting: Family Medicine

## 2021-12-27 ENCOUNTER — Other Ambulatory Visit: Payer: Self-pay | Admitting: Family Medicine

## 2022-01-16 ENCOUNTER — Telehealth: Payer: Self-pay | Admitting: Family Medicine

## 2022-01-16 ENCOUNTER — Telehealth: Payer: Self-pay

## 2022-01-16 NOTE — Telephone Encounter (Signed)
Can you check with the patient to see if she would be ok switching the jardiance to farxiga? A pharmacist contacted me noting the patient advised them that the jardiance was contributing to some constipation.

## 2022-01-16 NOTE — Telephone Encounter (Signed)
I called and spoke with the patient and she is okay with the switch to [place].

## 2022-01-16 NOTE — Progress Notes (Signed)
[facility]) Care Management  [place]   01/16/2022  Danielle Park Feb 20, 1951 [id number]   2024 Medication Assistance Renewal Application Summary:  Patient was outreached by Danielle Park regarding medication assistance renewal for 2024. Verified address, anticipated insurance for 2024, and income has not changed. Patient remains interested in PAP for 2024 for Jardiance and Trulicity, no other new medications were identified for medication assistance.   Of note, patient does report hesitancy with Jardiance as she reports endorsing constipation that she believes is from this medication. [name] reports this is an on going issue. About twice a year, she holds Jardiance for a few days to relieve constipation and feels that it helps. She also inquired about an alternative option for SGLT2 inhibitor. Patient informed Danielle Park will be updated regarding her concern. Again, she will would like medication assistance for Jardiance but with reservations.   No appointment with PCP scheduled for 2023.   Plan: I will notify PCP regarding patient's concern.   I will route patient assistance letter to Danielle Park, once I hear back from provider, who will coordinate patient assistance program application process for medications listed above.  Danielle Park will assist with obtaining all required documents from both patient and provider(s) and submit application(s) once completed.    Thank you for allowing pharmacy to be a part of this patient's care.   Danielle Park, Danielle Park Clinical Pharmacist [place] Cell: [phone]

## 2022-01-16 NOTE — Telephone Encounter (Signed)
-----   Message from Rowland Lathe, [facility] sent at 01/16/2022  4:20 PM EDT ----- Hi Dr. Caryl Bis,   I was working on completing the medication assistance renewal for 2024 for Jardiance and Trulicity. Patient reports ongoing issue of constipation she feels is related to [place]. She did ask if there were alternative replacement for Jardiance and explained she might could try another drug withing that class. Of note, constipation is a post-marketing effect noted for Jardiance that I did not find with [place].   If deemed clinically appropriate, please consider discussing SGLT2 inhibitor use with patient. As for now, will hold Jardiance application renewal until I hear back.   Thanks,  [name], PharmD

## 2022-01-17 ENCOUNTER — Other Ambulatory Visit: Payer: Self-pay

## 2022-01-17 MED ORDER — DAPAGLIFLOZIN PROPANEDIOL 10 MG PO TABS
10.0000 mg | ORAL_TABLET | Freq: Every day | ORAL | 1 refills | Status: DC
  Filled 2022-01-17: qty 30, 30d supply, fill #0
  Filled 2022-02-27: qty 30, 30d supply, fill #1
  Filled 2022-03-30: qty 30, 30d supply, fill #2
  Filled 2022-04-27: qty 30, 30d supply, fill #3
  Filled 2022-05-31: qty 30, 30d supply, fill #4
  Filled 2022-06-29: qty 30, 30d supply, fill #5

## 2022-01-17 NOTE — Telephone Encounter (Signed)
Sent to [facility] pharmacy to work on patient assistance.

## 2022-01-17 NOTE — Addendum Note (Signed)
Addended by: Caryl Bis, Devory Mckinzie G on: 01/17/2022 12:30 PM   Modules accepted: Orders

## 2022-01-20 ENCOUNTER — Other Ambulatory Visit: Payer: Self-pay

## 2022-01-23 ENCOUNTER — Encounter (INDEPENDENT_AMBULATORY_CARE_PROVIDER_SITE_OTHER): Payer: Self-pay

## 2022-01-24 NOTE — Progress Notes (Signed)
[facility]) Care Management  [place]   01/25/2022  Danielle Park [birth year] [id number]   2024 Medication Assistance Renewal Application Summary:  Patient was outreached by [place] Team regarding medication assistance renewal for 2024.  Per prior discussion, patient noted that Jardiance caused constipation and she was not sure whether she would like to remain on medication. Discussed there are alternatives within the SGLT2 inhibitor drug class and will discuss with PCP; patient was agreeable. This pharmacist reached out to Dr. Caryl Bis with [name]'s concern and Jardiance was switched to [place] due to side effect profile.   Discussed therapeutic change update with patient, who feels that she would like to hold off proceeding with medication assistance to [place] as she wants to make sure it doesn't cause constipation. However, she has not filled the 30-days supply of Farxiga yet. Given patient's hesitancy with continuing Jardiance or Farxiga, I suggested to hold on medication assistance for Jardiance or Farxiga until she knows the  side effect of [place]. Although this was a suggestion, informed the patient if she would like to proceed with Jardiance/Farxiga medication assistance she is more than welcome and to follow up provider for PAP when she is ready.  At this time, patient is aware we will continue with Trulicity medication assistance. I will notify PCP regarding patient's concern.    Thank you for allowing pharmacy to be a part of this patient's care.   [name], PharmD Clinical Pharmacist [place] Cell: [phone]

## 2022-01-25 ENCOUNTER — Telehealth: Payer: Self-pay

## 2022-01-27 ENCOUNTER — Telehealth: Payer: Self-pay | Admitting: Pharmacy Technician

## 2022-01-27 DIAGNOSIS — Z596 Low income: Secondary | ICD-10-CM

## 2022-01-27 NOTE — Progress Notes (Signed)
[facility])                                            [place] Team    01/27/2022  Danielle Park Oct 11, 1950 [id number]                                      Medication Assistance Referral-FOR 2024 RE ENROLLMENT  Referral From:  [facility]  [name]  Medication/Company: [name] / [name] Patient application portion:  Mailed Provider application portion: Faxed  to Dr. Tommi Rumps Provider address/fax verified via: Office website  Dock Baccam P. Fallon Haecker, [place]  [phone]

## 2022-02-01 ENCOUNTER — Other Ambulatory Visit: Payer: Self-pay

## 2022-02-01 DIAGNOSIS — N1831 Chronic kidney disease, stage 3a: Secondary | ICD-10-CM

## 2022-02-01 DIAGNOSIS — Z794 Long term (current) use of insulin: Secondary | ICD-10-CM

## 2022-02-01 MED ORDER — TRULICITY 4.5 MG/0.5ML ~~LOC~~ SOAJ: 4.5000 mg | SUBCUTANEOUS | 1 refills | Status: DC

## 2022-02-03 ENCOUNTER — Other Ambulatory Visit: Payer: Self-pay | Admitting: Family Medicine

## 2022-02-03 DIAGNOSIS — I150 Renovascular hypertension: Secondary | ICD-10-CM

## 2022-02-15 ENCOUNTER — Other Ambulatory Visit: Payer: Self-pay | Admitting: Family Medicine

## 2022-02-15 DIAGNOSIS — E1122 Type 2 diabetes mellitus with diabetic chronic kidney disease: Secondary | ICD-10-CM

## 2022-02-22 ENCOUNTER — Encounter: Payer: Self-pay | Admitting: Family Medicine

## 2022-02-23 NOTE — Telephone Encounter (Signed)
I called patient and offered her an appt with Dr. Darrick Huntsman tomorrow morning at 9:30am. Pt stated that she had something to do & that she would call back later for a different appt.

## 2022-02-23 NOTE — Telephone Encounter (Signed)
I would recommend an appointment for this. Please follow-up with her to see if she wants to schedule one.

## 2022-02-24 NOTE — Telephone Encounter (Signed)
LMTCB to schedule an appointment

## 2022-02-24 NOTE — Telephone Encounter (Signed)
LMTCB to schedule an appointment per Dr. Birdie Sons

## 2022-02-27 ENCOUNTER — Other Ambulatory Visit: Payer: Self-pay

## 2022-02-28 ENCOUNTER — Other Ambulatory Visit: Payer: Self-pay | Admitting: Family Medicine

## 2022-02-28 NOTE — Telephone Encounter (Signed)
Last OV 12/12/21 Next OV 06/12/22 Last fill: 09/07/21 10 tabs 0 refills

## 2022-03-05 ENCOUNTER — Telehealth: Payer: Self-pay | Admitting: Pharmacy Technician

## 2022-03-05 DIAGNOSIS — Z596 Low income: Secondary | ICD-10-CM

## 2022-03-05 NOTE — Progress Notes (Signed)
[facility])                                            [facility] Quality Pharmacy Team    03/05/2022  LYLEE CORROW May 26, 1950 [id number]  Received both patient and provider portion(s) of patient assistance application(s) for Trulicity. Faxed completed application and required documents into [name].   Ajaya Crutchfield P. Viney Acocella, CPhT [facility] [employer]  [phone]

## 2022-03-15 ENCOUNTER — Other Ambulatory Visit: Payer: Self-pay | Admitting: Family Medicine

## 2022-03-27 ENCOUNTER — Other Ambulatory Visit: Payer: Self-pay | Admitting: Family Medicine

## 2022-03-30 ENCOUNTER — Other Ambulatory Visit: Payer: Self-pay

## 2022-03-30 DIAGNOSIS — I701 Atherosclerosis of renal artery: Secondary | ICD-10-CM | POA: Diagnosis not present

## 2022-03-30 DIAGNOSIS — I129 Hypertensive chronic kidney disease with stage 1 through stage 4 chronic kidney disease, or unspecified chronic kidney disease: Secondary | ICD-10-CM | POA: Diagnosis not present

## 2022-03-30 DIAGNOSIS — N1832 Chronic kidney disease, stage 3b: Secondary | ICD-10-CM | POA: Diagnosis not present

## 2022-03-30 DIAGNOSIS — E1122 Type 2 diabetes mellitus with diabetic chronic kidney disease: Secondary | ICD-10-CM | POA: Diagnosis not present

## 2022-03-30 DIAGNOSIS — R809 Proteinuria, unspecified: Secondary | ICD-10-CM | POA: Diagnosis not present

## 2022-04-03 ENCOUNTER — Telehealth: Payer: Self-pay | Admitting: Pharmacy Technician

## 2022-04-03 DIAGNOSIS — Z596 Low income: Secondary | ICD-10-CM

## 2022-04-03 DIAGNOSIS — I10 Essential (primary) hypertension: Secondary | ICD-10-CM | POA: Diagnosis not present

## 2022-04-03 DIAGNOSIS — E1122 Type 2 diabetes mellitus with diabetic chronic kidney disease: Secondary | ICD-10-CM | POA: Diagnosis not present

## 2022-04-03 DIAGNOSIS — N1831 Chronic kidney disease, stage 3a: Secondary | ICD-10-CM | POA: Diagnosis not present

## 2022-04-03 NOTE — Progress Notes (Signed)
[facility])                                            [place] Team    04/03/2022  Danielle Park 1950/11/11 [id number]  Care coordination call placed to [name] in regard to [name].  Spoke to [name] who informs patient is APPROVED 03/27/22-03/27/23. He informs the medication will auto refill and ship to patient's home.  Ronnesha Mester P. Aqil Goetting, [place]  [phone]

## 2022-04-27 ENCOUNTER — Other Ambulatory Visit: Payer: Self-pay

## 2022-05-04 DIAGNOSIS — E113393 Type 2 diabetes mellitus with moderate nonproliferative diabetic retinopathy without macular edema, bilateral: Secondary | ICD-10-CM | POA: Diagnosis not present

## 2022-05-04 DIAGNOSIS — H2513 Age-related nuclear cataract, bilateral: Secondary | ICD-10-CM | POA: Diagnosis not present

## 2022-05-04 LAB — HM DIABETES EYE EXAM

## 2022-05-18 DIAGNOSIS — N1832 Chronic kidney disease, stage 3b: Secondary | ICD-10-CM | POA: Diagnosis not present

## 2022-05-18 DIAGNOSIS — E1122 Type 2 diabetes mellitus with diabetic chronic kidney disease: Secondary | ICD-10-CM | POA: Diagnosis not present

## 2022-05-18 DIAGNOSIS — R809 Proteinuria, unspecified: Secondary | ICD-10-CM | POA: Diagnosis not present

## 2022-05-18 DIAGNOSIS — I10 Essential (primary) hypertension: Secondary | ICD-10-CM | POA: Diagnosis not present

## 2022-05-18 DIAGNOSIS — N1831 Chronic kidney disease, stage 3a: Secondary | ICD-10-CM | POA: Diagnosis not present

## 2022-05-18 DIAGNOSIS — I129 Hypertensive chronic kidney disease with stage 1 through stage 4 chronic kidney disease, or unspecified chronic kidney disease: Secondary | ICD-10-CM | POA: Diagnosis not present

## 2022-05-18 DIAGNOSIS — I701 Atherosclerosis of renal artery: Secondary | ICD-10-CM | POA: Diagnosis not present

## 2022-05-20 ENCOUNTER — Ambulatory Visit
Admission: EM | Admit: 2022-05-20 | Discharge: 2022-05-20 | Disposition: A | Discharge status: Home / Self Care | Payer: PPO | Attending: Emergency Medicine | Admitting: Emergency Medicine

## 2022-05-20 DIAGNOSIS — N390 Urinary tract infection, site not specified: Secondary | ICD-10-CM | POA: Diagnosis not present

## 2022-05-20 LAB — POCT URINALYSIS DIP (MANUAL ENTRY)
Bilirubin, UA: NEGATIVE
Glucose, UA: 500 mg/dL — AB
Ketones, POC UA: NEGATIVE mg/dL
Nitrite, UA: POSITIVE — AB
Protein Ur, POC: 30 mg/dL — AB
Spec Grav, UA: 1.01 (ref 1.010–1.025)
Urobilinogen, UA: 0.2 E.U./dL
pH, UA: 5.5 (ref 5.0–8.0)

## 2022-05-20 MED ORDER — CEPHALEXIN 500 MG PO CAPS: 500.0000 mg | ORAL_CAPSULE | Freq: Two times a day (BID) | ORAL | 0 refills | Status: AC

## 2022-05-20 NOTE — Discharge Instructions (Addendum)
Take the antibiotic as directed.  The urine culture is pending.  We will call you if it shows the need to change or discontinue your antibiotic.    Follow up with your primary care provider if your symptoms are not improving.    
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
## 2022-05-20 NOTE — ED Triage Notes (Signed)
Patient to Urgent Care with complaints of dysuria/ urinary urgency and frequency.  Symptoms started at least one week ago.  Denies any known fevers.

## 2022-05-22 LAB — URINE CULTURE: Culture: 90000 — AB

## 2022-05-31 ENCOUNTER — Other Ambulatory Visit: Payer: Self-pay

## 2022-06-09 ENCOUNTER — Other Ambulatory Visit: Payer: Self-pay | Admitting: Family Medicine

## 2022-06-12 ENCOUNTER — Ambulatory Visit: Payer: PPO | Admitting: Family Medicine

## 2022-06-12 NOTE — Telephone Encounter (Signed)
I will refill this.  Patient missed her appointment today.  Please get her rescheduled.  Thanks.

## 2022-06-16 ENCOUNTER — Other Ambulatory Visit: Payer: Self-pay | Admitting: Family Medicine

## 2022-06-16 DIAGNOSIS — I150 Renovascular hypertension: Secondary | ICD-10-CM

## 2022-06-26 ENCOUNTER — Other Ambulatory Visit: Payer: Self-pay | Admitting: Family Medicine

## 2022-06-26 DIAGNOSIS — Z1231 Encounter for screening mammogram for malignant neoplasm of breast: Secondary | ICD-10-CM

## 2022-06-28 ENCOUNTER — Other Ambulatory Visit: Payer: Self-pay | Admitting: Family Medicine

## 2022-06-29 ENCOUNTER — Other Ambulatory Visit: Payer: Self-pay

## 2022-06-30 NOTE — Telephone Encounter (Signed)
Okay to refill until appt 07/31/22 last TSH 3/23

## 2022-07-04 ENCOUNTER — Other Ambulatory Visit: Payer: Self-pay

## 2022-07-04 DIAGNOSIS — Z794 Long term (current) use of insulin: Secondary | ICD-10-CM

## 2022-07-27 ENCOUNTER — Other Ambulatory Visit: Payer: Self-pay

## 2022-07-27 ENCOUNTER — Other Ambulatory Visit: Payer: Self-pay | Admitting: Family Medicine

## 2022-07-28 ENCOUNTER — Other Ambulatory Visit: Payer: Self-pay

## 2022-07-28 MED FILL — Dapagliflozin Propanediol Tab 10 MG (Base Equivalent): ORAL | 90 days supply | Qty: 90 | Fill #0 | Status: CN

## 2022-07-31 ENCOUNTER — Encounter: Payer: Self-pay | Admitting: Family Medicine

## 2022-07-31 ENCOUNTER — Ambulatory Visit (INDEPENDENT_AMBULATORY_CARE_PROVIDER_SITE_OTHER): Payer: PPO | Admitting: Family Medicine

## 2022-07-31 ENCOUNTER — Other Ambulatory Visit: Payer: Self-pay

## 2022-07-31 VITALS — BP 128/76 | HR 64 | Temp 97.7°F | Ht 63.0 in | Wt 172.8 lb

## 2022-07-31 DIAGNOSIS — Z7984 Long term (current) use of oral hypoglycemic drugs: Secondary | ICD-10-CM

## 2022-07-31 DIAGNOSIS — N1831 Chronic kidney disease, stage 3a: Secondary | ICD-10-CM

## 2022-07-31 DIAGNOSIS — E1122 Type 2 diabetes mellitus with diabetic chronic kidney disease: Secondary | ICD-10-CM | POA: Diagnosis not present

## 2022-07-31 DIAGNOSIS — I150 Renovascular hypertension: Secondary | ICD-10-CM

## 2022-07-31 DIAGNOSIS — Z794 Long term (current) use of insulin: Secondary | ICD-10-CM | POA: Diagnosis not present

## 2022-07-31 LAB — POCT GLYCOSYLATED HEMOGLOBIN (HGB A1C): Hemoglobin A1C: 6.1 % — AB (ref 4.0–5.6)

## 2022-07-31 MED ORDER — CLOPIDOGREL BISULFATE 75 MG PO TABS: 75.0000 mg | ORAL_TABLET | Freq: Every day | ORAL | 2 refills | Status: DC

## 2022-07-31 NOTE — Assessment & Plan Note (Addendum)
Chronic issue.  Well-controlled.  Patient will continue amlodipine 5 mg daily, metoprolol 25 mg twice daily, Lasix 20 mg daily, hydralazine 25 mg 3 times daily, and lisinopril 40 mg daily

## 2022-07-31 NOTE — Progress Notes (Signed)
Marikay Alar, MD Phone: [phone]  Danielle Park is a 72 y.o. female who presents today for f/u.  HYPERTENSION Disease Monitoring Home BP Monitoring 106/68 Chest pain- no    Dyspnea- no Medications Compliance-  taking amlodipine, hydralazine, lasix, lisinopril, metoprolol  Edema- no BMET    Component Value Date/Time   NA 136 12/12/2021 1054   NA 137 07/29/2012 1307   K 3.8 12/12/2021 1054   K 4.6 07/29/2012 1307   CL 104 12/12/2021 1054   CL 104 07/29/2012 1307   CO2 24 12/12/2021 1054   CO2 28 07/29/2012 1307   GLUCOSE 137 (H) 12/12/2021 1054   GLUCOSE 218 (H) 07/29/2012 1307   BUN 16 12/12/2021 1054   BUN 13 07/29/2012 1307   CREATININE 1.26 (H) 12/12/2021 1054   CREATININE 1.06 (H) 04/05/2018 1635   CALCIUM 9.1 12/12/2021 1054   CALCIUM 9.4 07/29/2012 1307   GFRNONAA >60 03/06/2015 1151   GFRNONAA >60 07/29/2012 1307   GFRAA >60 03/06/2015 1151   GFRAA >60 07/29/2012 1307     Social History   Tobacco Use  Smoking Status Never  Smokeless Tobacco Never    Current Outpatient Medications on File Prior to Visit  Medication Sig Dispense Refill   amLODipine (NORVASC) 5 MG tablet TAKE 1 TABLET (5 MG TOTAL) BY MOUTH DAILY. 90 tablet 1   aspirin 81 MG tablet Take 81 mg by mouth daily.     baclofen (LIORESAL) 10 MG tablet TAKE 1/2 TABLET BY MOUTH 3 TIMES DAILY AS NEEDED FOR MUSCLE SPASMS. 10 tablet 0   BD PEN NEEDLE NANO 2ND GEN 32G X 4 MM MISC USE 1 PEN NEEDLE DAILY AS DIRECTED 100 each 3   cholecalciferol (VITAMIN D3) 25 MCG (1000 UT) tablet Take 2,000 Units by mouth daily.     dapagliflozin propanediol (FARXIGA) 10 MG TABS tablet Take 1 tablet (10 mg total) by mouth daily before breakfast. 90 tablet 0   furosemide (LASIX) 20 MG tablet TAKE 1 TABLET BY MOUTH EVERY DAY 90 tablet 1   hydrALAZINE (APRESOLINE) 25 MG tablet TAKE 1 TABLET BY MOUTH THREE TIMES A DAY 270 tablet 1   levothyroxine (SYNTHROID) 75 MCG tablet TAKE ONE TABLET BY MOUTH ONCE DAILY BEFORE  BREAKFAST 90 tablet 0   lisinopril (ZESTRIL) 40 MG tablet TAKE 1 TABLET BY MOUTH EVERY DAY 90 tablet 2   metoprolol tartrate (LOPRESSOR) 25 MG tablet TAKE 1 TABLET BY MOUTH TWICE A DAY 180 tablet 1   rosuvastatin (CRESTOR) 10 MG tablet TAKE 1 TABLET BY MOUTH EVERY DAY 90 tablet 1   TRULICITY 4.5 MG/0.5ML SOPN INJECT 4.5MG  (0.5ML) UNDER THE SKIN ONCE A WEEK 8 mL 0   No current facility-administered medications on file prior to visit.     ROS see history of present illness  Objective  Physical Exam Vitals:   07/31/22 1623 07/31/22 1652  BP: 134/78 128/76  Pulse: 64   Temp: 97.7 F (36.5 C)   SpO2: 98%     BP Readings from Last 3 Encounters:  07/31/22 128/76  05/20/22 132/77  12/12/21 (!) 140/70   Wt Readings from Last 3 Encounters:  07/31/22 172 lb 12.8 oz (78.4 kg)  12/12/21 178 lb 6.4 oz (80.9 kg)  10/18/21 177 lb 12.8 oz (80.6 kg)    Physical Exam Constitutional:      General: She is not in acute distress.    Appearance: She is not diaphoretic.  Cardiovascular:     Rate and Rhythm: Normal rate and  regular rhythm.     Heart sounds: Normal heart sounds.  Pulmonary:     Effort: Pulmonary effort is normal.     Breath sounds: Normal breath sounds.  Skin:    General: Skin is warm and dry.  Neurological:     Mental Status: She is alert.      Assessment/Plan: Please see individual problem list.  Type 2 diabetes mellitus with stage 3a chronic kidney disease, with long-term current use of insulin (HCC) Assessment & Plan: Chronic issue.  Well-controlled.  Continue Farxiga 10 mg daily and Trulicity 4.5 mg weekly.  Orders: -     Microalbumin / creatinine urine ratio -     POCT glycosylated hemoglobin (Hb A1C) -     AMB Referral to Pharmacy Medication Management  Renovascular hypertension Assessment & Plan: Chronic issue.  Well-controlled.  Patient will continue amlodipine 5 mg daily, metoprolol 25 mg twice daily, Lasix 20 mg daily, hydralazine 25 mg 3 times  daily, and lisinopril 40 mg daily   Other orders -     Clopidogrel Bisulfate; Take 1 tablet (75 mg total) by mouth daily.  Dispense: 90 tablet; Refill: 2   Patient requested referral to clinical pharmacist to help with medication affordability.  Referral placed.  Return in about 6 months (around 01/31/2023).   Marikay Alar, MD [facility]

## 2022-07-31 NOTE — Assessment & Plan Note (Signed)
>>  ASSESSMENT AND PLAN FOR CONTROLLED DIABETES MELLITUS TYPE 2 WITH COMPLICATIONS (HCC) WRITTEN ON 07/31/2022  4:54 PM BY SONNENBERG, ERIC G, MD  Chronic issue.  Well-controlled.  Continue Farxiga  10 mg daily and Trulicity  4.5 mg weekly.

## 2022-07-31 NOTE — Assessment & Plan Note (Signed)
Chronic issue.  Well-controlled.  Continue Farxiga 10 mg daily and Trulicity 4.5 mg weekly.

## 2022-08-01 LAB — MICROALBUMIN / CREATININE URINE RATIO
Creatinine,U: 47.7 mg/dL
Microalb Creat Ratio: 1.5 mg/g (ref 0.0–30.0)
Microalb, Ur: <0.7 mg/dL (ref 0.0–1.9)

## 2022-08-03 ENCOUNTER — Other Ambulatory Visit: Payer: Self-pay | Admitting: Family Medicine

## 2022-08-03 DIAGNOSIS — Z794 Long term (current) use of insulin: Secondary | ICD-10-CM

## 2022-08-03 MED ORDER — TRULICITY 4.5 MG/0.5ML ~~LOC~~ SOAJ: SUBCUTANEOUS | 3 refills | Status: DC

## 2022-08-07 ENCOUNTER — Telehealth: Payer: Self-pay

## 2022-08-07 NOTE — Progress Notes (Signed)
   Care Guide Note  08/07/2022 Name: Danielle Park MRN: [id number] DOB: 1951-02-17  Referred by: Glori Luis, MD Reason for referral : Care Coordination (Outreach to schedule with Pharm d)   Danielle Park is a 72 y.o. year old female who is a primary care patient of Birdie Sons, Yehuda Mao, MD. Oren Beckmann was referred to the pharmacist for assistance related to DM.    Successful contact was made with the patient to discuss pharmacy services including being ready for the pharmacist to call at least 5 minutes before the scheduled appointment time, to have medication bottles and any blood sugar or blood pressure readings ready for review. The patient agreed to meet with the pharmacist via with the pharmacist via telephone visit on (date/time).  09/05/2022  Danielle Park, RMA Care Guide [facility]  [place], [place] [address] Direct Dial: [phone] [email or web address]

## 2022-08-11 ENCOUNTER — Other Ambulatory Visit: Payer: Self-pay

## 2022-08-23 ENCOUNTER — Other Ambulatory Visit: Payer: Self-pay | Admitting: Family Medicine

## 2022-08-24 ENCOUNTER — Telehealth: Payer: Self-pay | Admitting: Pharmacy Technician

## 2022-08-24 DIAGNOSIS — Z5986 Financial insecurity: Secondary | ICD-10-CM

## 2022-08-24 NOTE — Progress Notes (Signed)
[facility] [employer])                                            [facility] Quality Pharmacy Team    08/24/2022  Danielle Park 12/07/1950 [id number]                                    Medication Assistance Referral  Referral From:  Medication Adherence Report HTA-Nancy Bozarth, CPhT  with [facility] Pharm[facility]/Quality Dept.  Medication/Company: Marcelline Deist / [place] Patient application portion:  Mailed Provider application portion: Faxed  to Dr. Marikay Alar Provider address/fax verified via: Office website   Lorene Klimas P. Khari Lett, CPhT [facility] [employer]  [phone]

## 2022-09-05 ENCOUNTER — Other Ambulatory Visit: Payer: PPO | Admitting: Pharmacist

## 2022-09-05 ENCOUNTER — Telehealth: Payer: Self-pay

## 2022-09-05 NOTE — Telephone Encounter (Signed)
Medication Samples have been provided to the patient.  Drug name: Marcelline Deist       Strength: 10 mg        Qty: 7per box (4 boxes=28)  LOT: [id number]  Exp.Date: 01/24/2025  Dosing instructions: Take 1 tablet by mouth before breakfast  The patient has been instructed regarding the correct time, dose, and frequency of taking this medication, including desired effects and most common side effects.   Francene Boyers 3:38 PM 09/05/2022

## 2022-09-05 NOTE — Progress Notes (Signed)
09/05/2022 Name: Danielle Park MRN: [id number] DOB: 1950-05-08  Chief Complaint  Patient presents with   Medication Management   Diabetes   Hypertension   Hyperlipidemia    Danielle Park is a 72 y.o. year old female who presented for a telephone visit.   They were referred to the pharmacist by their PCP for assistance in managing medication access.    Subjective:  Care Team: Primary Care Provider: Glori Luis, MD ; Next Scheduled Visit: 02/05/23  Medication Access/Adherence  Current Pharmacy:  [facility] - [place], [place] - [address] [place] [address] Phone: [phone] Fax: [phone]  [facility] [address] [place] - [address] [place] [address] Phone: [phone] Fax: [phone]  [facility] [place], [place] - [address] [place] [address] Phone: [phone] Fax: [phone]  [facility] - [place], [place] - [address] [address] [place] [address] Phone: [phone] Fax: [phone]  [facility] [address] [place] [address] Phone: [phone] Fax: [phone]   Patient reports affordability concerns with their medications: Yes  Patient reports access/transportation concerns to their pharmacy: No  Patient reports adherence concerns with their medications:  No     Diabetes:  Current medications: Trulicity 4.5 mg weekly, Farxiga 10 mg daily  Reports she is out of [place].   Current medication access support: Trulicity through patient assistance  Hypertension:  Current medications: amlodipine 5 mg daily, hydralazine 25 mg twice daily, lisinopril 40 mg daily, metoprolol tartrate 25 mg twice daily; furosemide 20 mg daily    Hyperlipidemia/ASCVD Risk Reduction  Current lipid lowering  medications: rosuvastatin 10 mg daily  Antiplatelet regimen: aspirin 81 mg daily + clopidogrel 75 mg daily    Objective:  Lab Results  Component Value Date   HGBA1C 6.1 (A) 07/31/2022    Lab Results  Component Value Date   CREATININE 1.26 (H) 12/12/2021   BUN 16 12/12/2021   NA 136 12/12/2021   K 3.8 12/12/2021   CL 104 12/12/2021   CO2 24 12/12/2021    Lab Results  Component Value Date   CHOL 128 12/12/2021   HDL 49.30 12/12/2021   LDLCALC 54 12/12/2021   LDLDIRECT 59.0 10/02/2018   TRIG 124.0 12/12/2021   CHOLHDL 3 12/12/2021    Medications Reviewed Today     Reviewed by Alden Hipp, RPH-CPP (Pharmacist) on 09/05/22 at 1057  Med List Status: <None>   Medication Order Taking? Sig Documenting Provider Last Dose Status Informant  amLODipine (NORVASC) 5 MG tablet [id number] Yes TAKE 1 TABLET (5 MG TOTAL) BY MOUTH DAILY. Glori Luis, MD Taking Active   aspirin 81 MG tablet [id number] Yes Take 81 mg by mouth daily. [provider] Taking Active   baclofen (LIORESAL) 10 MG tablet [id number] Yes TAKE 1/2 TABLET BY MOUTH 3 TIMES DAILY AS NEEDED FOR MUSCLE SPASMS. Glori Luis, MD Taking Active   cholecalciferol (VITAMIN D3) 25 MCG (1000 UT) tablet [id number] Yes Take 2,000 Units by mouth daily. [provider] Taking Active   clopidogrel (PLAVIX) 75 MG tablet [id number] Yes Take 1 tablet (75 mg total) by mouth daily. Glori Luis, MD Taking Active   dapagliflozin propanediol (FARXIGA) 10 MG TABS tablet [id number] No Take 1 tablet (10 mg total) by mouth daily before breakfast.  Patient not taking: Reported on 09/05/2022   Glori Luis, MD Not Taking Active   Dulaglutide (TRULICITY) 4.5 MG/0.5ML [name] [id number] Yes INJECT 4.5MG  (0.5ML) UNDER THE SKIN ONCE A WEEK Glori Luis, MD Taking Active   furosemide (LASIX) 20 MG tablet [id number] Yes TAKE 1 TABLET BY MOUTH EVERY DAY Glori Luis, MD Taking Active   hydrALAZINE  (APRESOLINE) 25 MG tablet [id number] No TAKE 1 TABLET BY MOUTH THREE TIMES A DAY  Patient not taking: Reported on 09/05/2022   Glori Luis, MD Not Taking Active            Med Note Alden Hipp   Tue Sep 05, 2022 10:55 AM) Twice daily  levothyroxine (SYNTHROID) 75 MCG tablet [id number] Yes TAKE ONE TABLET BY MOUTH ONCE DAILY BEFORE BREAKFAST Glori Luis, MD Taking Active   lisinopril (ZESTRIL) 40 MG tablet [id number] Yes TAKE 1 TABLET BY MOUTH EVERY DAY Glori Luis, MD Taking Active   metoprolol tartrate (LOPRESSOR) 25 MG tablet [id number] Yes TAKE 1 TABLET BY MOUTH TWICE A DAY Glori Luis, MD Taking Active   rosuvastatin (CRESTOR) 10 MG tablet [id number] Yes TAKE 1 TABLET BY MOUTH EVERY DAY Glori Luis, MD Taking Active               Assessment/Plan:   Diabetes: - Currently controlled but with medication access issues. Currently working with pharmacy technician team on [place] patient assistance. Will check with the office on sample supply. - Recommend to continue current regimen at this time  Hypertension: - Currently controlled - Reviewed appropriate blood pressure monitoring technique and reviewed goal blood pressure. Recommended to check home blood pressure and heart rate periodically - Recommend to continue current regimen at this time   Hyperlipidemia/ASCVD Risk Reduction: - Currently controlled.  - Recommend to continue current regimen at this time  Follow Up Plan: follow up with pharmacy technician on access  Catie Eppie Gibson, PharmD, BCACP, CPP [facility] [phone]

## 2022-09-05 NOTE — Progress Notes (Signed)
Fixed 4 boxes of Farxiga 10 mg tablets f(28 tablets) or this Patient and left her a message to come pick up.

## 2022-09-14 ENCOUNTER — Ambulatory Visit: Payer: PPO

## 2022-09-22 ENCOUNTER — Telehealth: Payer: Self-pay | Admitting: Pharmacy Technician

## 2022-09-22 DIAGNOSIS — Z5986 Financial insecurity: Secondary | ICD-10-CM

## 2022-09-22 NOTE — Progress Notes (Signed)
[facility])                                            [facility] Quality Pharmacy Team    09/22/2022  Danielle Park 02-Jul-1950 [id number]  Received both patient and provider portion(s) of patient assistance application(s) for Marcelline Deist Faxed completed application and required documents into AZ&ME.    Pattricia Boss, CPhT [facility] Office: [phone] Fax: [phone] Email: [email or web address]

## 2022-09-27 ENCOUNTER — Other Ambulatory Visit: Payer: Self-pay | Admitting: Family Medicine

## 2022-09-27 ENCOUNTER — Telehealth: Payer: Self-pay | Admitting: Pharmacy Technician

## 2022-09-27 DIAGNOSIS — Z5986 Financial insecurity: Secondary | ICD-10-CM

## 2022-09-27 NOTE — Progress Notes (Signed)
[facility])                                            [facility] Quality Pharmacy Team    09/27/2022  RESHONDA DIOS 1950-07-25 [id number]  Care coordination call placed to AZ&ME in regard to [facility] application.  Spoke to [place] who informs patient is APPROVED 09/22/22-03/27/23. She informs initial and subsequent refills will process automatically and be shipped to the patient's home. However, if patient feels current supply is not sufficient until next supply arrives, patient may call [place]&ME at [phone].  Pattricia Boss, CPhT [place]  [facility] Office: [phone] Fax: [phone] Email: [email or web address]

## 2022-10-19 ENCOUNTER — Other Ambulatory Visit (INDEPENDENT_AMBULATORY_CARE_PROVIDER_SITE_OTHER): Payer: Self-pay | Admitting: Vascular Surgery

## 2022-10-19 DIAGNOSIS — I739 Peripheral vascular disease, unspecified: Secondary | ICD-10-CM

## 2022-10-19 DIAGNOSIS — I701 Atherosclerosis of renal artery: Secondary | ICD-10-CM

## 2022-10-20 ENCOUNTER — Ambulatory Visit (INDEPENDENT_AMBULATORY_CARE_PROVIDER_SITE_OTHER): Payer: PPO | Admitting: Vascular Surgery

## 2022-10-20 ENCOUNTER — Ambulatory Visit (INDEPENDENT_AMBULATORY_CARE_PROVIDER_SITE_OTHER): Payer: PPO

## 2022-10-20 DIAGNOSIS — I701 Atherosclerosis of renal artery: Secondary | ICD-10-CM

## 2022-10-20 DIAGNOSIS — I739 Peripheral vascular disease, unspecified: Secondary | ICD-10-CM | POA: Diagnosis not present

## 2022-11-02 DIAGNOSIS — E113393 Type 2 diabetes mellitus with moderate nonproliferative diabetic retinopathy without macular edema, bilateral: Secondary | ICD-10-CM | POA: Diagnosis not present

## 2022-11-02 DIAGNOSIS — H2513 Age-related nuclear cataract, bilateral: Secondary | ICD-10-CM | POA: Diagnosis not present

## 2022-11-02 DIAGNOSIS — H35371 Puckering of macula, right eye: Secondary | ICD-10-CM | POA: Diagnosis not present

## 2022-11-06 ENCOUNTER — Ambulatory Visit
Admission: EM | Admit: 2022-11-06 | Discharge: 2022-11-06 | Disposition: A | Discharge status: Home / Self Care | Payer: PPO | Attending: Family Medicine | Admitting: Family Medicine

## 2022-11-06 DIAGNOSIS — N3000 Acute cystitis without hematuria: Secondary | ICD-10-CM | POA: Insufficient documentation

## 2022-11-06 LAB — POCT URINALYSIS DIP (MANUAL ENTRY)
Bilirubin, UA: NEGATIVE
Glucose, UA: 250 mg/dL — AB
Ketones, POC UA: NEGATIVE mg/dL
Nitrite, UA: NEGATIVE
Protein Ur, POC: NEGATIVE mg/dL
Spec Grav, UA: <=1.005 — AB (ref 1.010–1.025)
Urobilinogen, UA: 0.2 E.U./dL
pH, UA: 6.5 (ref 5.0–8.0)

## 2022-11-06 MED ORDER — CIPROFLOXACIN HCL 500 MG PO TABS: 500.0000 mg | ORAL_TABLET | Freq: Two times a day (BID) | ORAL | 0 refills | Status: AC

## 2022-11-06 NOTE — Discharge Instructions (Signed)
Urine culture is pending.  Urine culture typically will result within 2 to 3 days.  I am covering you for 3 days with ciprofloxacin due to possible UTI.  Our office will call you if your urine culture indicates a positive UTI.
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
## 2022-11-06 NOTE — ED Triage Notes (Signed)
Patient to Urgent Care with complaints of urinary urgency/ dysuria.  Symptoms started two days ago. Possible fever.

## 2022-12-10 ENCOUNTER — Other Ambulatory Visit: Payer: Self-pay | Admitting: Family Medicine

## 2022-12-10 DIAGNOSIS — I150 Renovascular hypertension: Secondary | ICD-10-CM

## 2023-01-31 ENCOUNTER — Other Ambulatory Visit: Payer: Self-pay | Admitting: Family Medicine

## 2023-02-05 ENCOUNTER — Encounter: Payer: Self-pay | Admitting: Family Medicine

## 2023-02-05 ENCOUNTER — Ambulatory Visit (INDEPENDENT_AMBULATORY_CARE_PROVIDER_SITE_OTHER): Payer: PPO | Admitting: Family Medicine

## 2023-02-05 VITALS — BP 122/72 | HR 62 | Temp 98.2°F | Ht 63.0 in | Wt 167.6 lb

## 2023-02-05 DIAGNOSIS — F4321 Adjustment disorder with depressed mood: Secondary | ICD-10-CM

## 2023-02-05 DIAGNOSIS — E039 Hypothyroidism, unspecified: Secondary | ICD-10-CM

## 2023-02-05 DIAGNOSIS — E1122 Type 2 diabetes mellitus with diabetic chronic kidney disease: Secondary | ICD-10-CM

## 2023-02-05 DIAGNOSIS — G8929 Other chronic pain: Secondary | ICD-10-CM

## 2023-02-05 DIAGNOSIS — Z794 Long term (current) use of insulin: Secondary | ICD-10-CM

## 2023-02-05 DIAGNOSIS — G4700 Insomnia, unspecified: Secondary | ICD-10-CM

## 2023-02-05 DIAGNOSIS — N1831 Chronic kidney disease, stage 3a: Secondary | ICD-10-CM | POA: Diagnosis not present

## 2023-02-05 DIAGNOSIS — I150 Renovascular hypertension: Secondary | ICD-10-CM | POA: Diagnosis not present

## 2023-02-05 DIAGNOSIS — M546 Pain in thoracic spine: Secondary | ICD-10-CM | POA: Diagnosis not present

## 2023-02-05 LAB — COMPREHENSIVE METABOLIC PANEL
ALT: 5 U/L (ref 0–35)
AST: 17 U/L (ref 0–37)
Albumin: 4.3 g/dL (ref 3.5–5.2)
Alkaline Phosphatase: 91 U/L (ref 39–117)
BUN: 15 mg/dL (ref 6–23)
CO2: 26 meq/L (ref 19–32)
Calcium: 9.7 mg/dL (ref 8.4–10.5)
Chloride: 101 meq/L (ref 96–112)
Creatinine, Ser: 1.33 mg/dL — ABNORMAL HIGH (ref 0.40–1.20)
GFR: 40.09 mL/min — ABNORMAL LOW (ref >60.00)
Glucose, Bld: 101 mg/dL — ABNORMAL HIGH (ref 70–99)
Potassium: 4.3 meq/L (ref 3.5–5.1)
Sodium: 136 meq/L (ref 135–145)
Total Bilirubin: 0.6 mg/dL (ref 0.2–1.2)
Total Protein: 7.8 g/dL (ref 6.0–8.3)

## 2023-02-05 LAB — LIPID PANEL
Cholesterol: 135 mg/dL (ref 0–200)
HDL: 60.6 mg/dL (ref >39.00)
LDL Cholesterol: 52 mg/dL (ref 0–99)
NonHDL: 74.24
Total CHOL/HDL Ratio: 2
Triglycerides: 109 mg/dL (ref 0.0–149.0)
VLDL: 21.8 mg/dL (ref 0.0–40.0)

## 2023-02-05 LAB — MICROALBUMIN / CREATININE URINE RATIO
Creatinine,U: 26 mg/dL
Microalb Creat Ratio: 4.8 mg/g (ref 0.0–30.0)
Microalb, Ur: 1.3 mg/dL (ref 0.0–1.9)

## 2023-02-05 LAB — TSH: TSH: 2.25 u[IU]/mL (ref 0.35–5.50)

## 2023-02-05 LAB — HEMOGLOBIN A1C: Hgb A1c MFr Bld: 6.1 % (ref 4.6–6.5)

## 2023-02-05 NOTE — Assessment & Plan Note (Signed)
Offered support.  She will monitor for any worsening symptoms.

## 2023-02-05 NOTE — Assessment & Plan Note (Signed)
Chronic issue.  Offered physical therapy versus referral to specialist.  Patient opted for referral to a specialist.  This was placed.

## 2023-02-05 NOTE — Assessment & Plan Note (Signed)
Chronic issue.  Worsened to a certain degree with the loss of her husband.  She can continue trazodone though she will check to see if this is out of date and will let me know so I can prescribe a new prescription if needed.

## 2023-02-05 NOTE — Assessment & Plan Note (Signed)
>>  ASSESSMENT AND PLAN FOR CONTROLLED DIABETES MELLITUS TYPE 2 WITH COMPLICATIONS (HCC) WRITTEN ON 02/05/2023  1:26 PM BY SONNENBERG, ERIC G, MD  Chronic issue.  Check A1c.  Continue Farxiga  10 mg daily and Trulicity  4.5 mg weekly.

## 2023-02-05 NOTE — Assessment & Plan Note (Signed)
Check TSH.  Continue Synthroid 75 mcg daily. 

## 2023-02-05 NOTE — Assessment & Plan Note (Signed)
Chronic issue.  Check A1c.  Continue Farxiga 10 mg daily and Trulicity 4.5 mg weekly.

## 2023-02-05 NOTE — Progress Notes (Signed)
Marikay Alar, MD Phone: [phone]  Danielle Park is a 72 y.o. female who presents today for f/u.  Diabetes: Typically fasting is around 110.  Taking [place] and Trulicity.  No polyuria or polydipsia.  No hypoglycemia.  Hypertension: Typically 109-120/59-62.  Taking amlodipine, hydralazine, Lasix, lisinopril, and metoprolol.  No chest pain or shortness of breath.  Grief: Patient lost her husband over the summer.  She has not been sleeping all that well though this is a chronic issue.  She will occasionally take trazodone that she has at home for this issue.  She is not sure if this is out of date.  Chronic back pain: Patient reports chronic thoracic back pain that has worsened recently.  Feels like a knife stabs her in the back.  There are 2 spots 1 on the left and 1 on the right that bother her.  Certain movements make it worse.  She notes no radiation of the pain.  No numbness or weakness.  She will get spasms and uses a heating pad and takes a muscle relaxer for this.  Social History   Tobacco Use  Smoking Status Never  Smokeless Tobacco Never    Current Outpatient Medications on File Prior to Visit  Medication Sig Dispense Refill   amLODipine (NORVASC) 5 MG tablet TAKE 1 TABLET (5 MG TOTAL) BY MOUTH DAILY. 90 tablet 1   aspirin 81 MG tablet Take 81 mg by mouth daily.     baclofen (LIORESAL) 10 MG tablet TAKE 1/2 TABLET BY MOUTH 3 TIMES DAILY AS NEEDED FOR MUSCLE SPASMS. 10 tablet 0   cholecalciferol (VITAMIN D3) 25 MCG (1000 UT) tablet Take 2,000 Units by mouth daily.     clopidogrel (PLAVIX) 75 MG tablet Take 1 tablet (75 mg total) by mouth daily. 90 tablet 2   dapagliflozin propanediol (FARXIGA) 10 MG TABS tablet Take 1 tablet (10 mg total) by mouth daily before breakfast. 90 tablet 0   Dulaglutide (TRULICITY) 4.5 MG/0.5ML SOPN INJECT 4.5MG  (0.5ML) UNDER THE SKIN ONCE A WEEK 8 mL 3   furosemide (LASIX) 20 MG tablet TAKE 1 TABLET BY MOUTH EVERY DAY 90 tablet 1   hydrALAZINE  (APRESOLINE) 25 MG tablet TAKE 1 TABLET BY MOUTH THREE TIMES A DAY 270 tablet 1   levothyroxine (SYNTHROID) 75 MCG tablet TAKE ONE TABLET BY MOUTH ONCE DAILY BEFORE BREAKFAST 90 tablet 1   lisinopril (ZESTRIL) 40 MG tablet TAKE 1 TABLET BY MOUTH EVERY DAY 90 tablet 2   metoprolol tartrate (LOPRESSOR) 25 MG tablet TAKE 1 TABLET BY MOUTH TWICE A DAY 180 tablet 1   rosuvastatin (CRESTOR) 10 MG tablet TAKE 1 TABLET BY MOUTH EVERY DAY 90 tablet 3   No current facility-administered medications on file prior to visit.     ROS see history of present illness  Objective  Physical Exam Vitals:   02/05/23 1258 02/05/23 1332  BP: (!) 142/82 122/72  Pulse: 62   Temp: 98.2 F (36.8 C)   SpO2: 99%     BP Readings from Last 3 Encounters:  02/05/23 122/72  11/06/22 (!) 169/84  07/31/22 128/76   Wt Readings from Last 3 Encounters:  02/05/23 167 lb 9.6 oz (76 kg)  07/31/22 172 lb 12.8 oz (78.4 kg)  12/12/21 178 lb 6.4 oz (80.9 kg)    Physical Exam Constitutional:      General: She is not in acute distress.    Appearance: She is not diaphoretic.  Cardiovascular:     Rate and Rhythm: Normal  rate and regular rhythm.     Heart sounds: Normal heart sounds.  Pulmonary:     Effort: Pulmonary effort is normal.     Breath sounds: Normal breath sounds.  Musculoskeletal:       Back:     Comments: No midline spine tenderness, no midline spine step-off  Skin:    General: Skin is warm and dry.  Neurological:     Mental Status: She is alert.      Assessment/Plan: Please see individual problem list.  Renovascular hypertension Assessment & Plan: Chronic issue.  Adequately controlled at home.  Patient will continue amlodipine 5 mg daily, Lasix 20 mg daily, hydralazine 25 mg 3 times daily, metoprolol 25 mg twice daily, and lisinopril 40 mg daily.  Orders: -     Comprehensive metabolic panel -     Lipid panel  Type 2 diabetes mellitus with stage 3a chronic kidney disease, with long-term  current use of insulin (HCC) Assessment & Plan: Chronic issue.  Check A1c.  Continue Farxiga 10 mg daily and Trulicity 4.5 mg weekly.  Orders: -     Hemoglobin A1c -     Microalbumin / creatinine urine ratio -     Lipid panel  Hypothyroidism, unspecified type Assessment & Plan: Check TSH.  Continue Synthroid 75 mcg daily.  Orders: -     TSH  Insomnia, unspecified type Assessment & Plan: Chronic issue.  Worsened to a certain degree with the loss of her husband.  She can continue trazodone though she will check to see if this is out of date and will let me know so I can prescribe a new prescription if needed.   Chronic right-sided thoracic back pain Assessment & Plan: Chronic issue.  Offered physical therapy versus referral to specialist.  Patient opted for referral to a specialist.  This was placed.  Orders: -     Ambulatory referral to Physical Medicine Rehab  Grief Assessment & Plan: Offered support.  She will monitor for any worsening symptoms.     Return in about 6 months (around 08/05/2023) for transfer of care.   Marikay Alar, MD [facility]

## 2023-02-05 NOTE — Assessment & Plan Note (Signed)
Chronic issue.  Adequately controlled at home.  Patient will continue amlodipine 5 mg daily, Lasix 20 mg daily, hydralazine 25 mg 3 times daily, metoprolol 25 mg twice daily, and lisinopril 40 mg daily.

## 2023-02-06 MED ORDER — TRAZODONE HCL 50 MG PO TABS: 25.0000 mg | ORAL_TABLET | Freq: Every evening | ORAL | 3 refills | Status: DC | PRN

## 2023-02-16 ENCOUNTER — Encounter: Payer: Self-pay | Admitting: Physical Medicine & Rehabilitation

## 2023-02-28 ENCOUNTER — Telehealth: Payer: Self-pay

## 2023-02-28 NOTE — Telephone Encounter (Signed)
Mail out pt portion for [employer] and [employer] or Farxiga 10 mg and Trulicity.

## 2023-02-28 NOTE — Telephone Encounter (Addendum)
Pt has read my chart message regarding 2025 patient assistance re enrollment for Farxiga through [employer] and Trulicity and pen needles through [employer] and not responded

## 2023-03-03 ENCOUNTER — Other Ambulatory Visit: Payer: Self-pay | Admitting: Family Medicine

## 2023-03-10 ENCOUNTER — Other Ambulatory Visit: Payer: Self-pay | Admitting: Family Medicine

## 2023-03-26 NOTE — Telephone Encounter (Signed)
Multiple attempts have been made to reach pt. Signing off on encounter.

## 2023-04-02 ENCOUNTER — Telehealth: Payer: Self-pay

## 2023-04-02 NOTE — Telephone Encounter (Signed)
 Faxed provider portion to office ([employer]) for Farxiga for 2025 re-enrollment.

## 2023-04-03 ENCOUNTER — Other Ambulatory Visit: Payer: Self-pay

## 2023-04-03 MED ORDER — DAPAGLIFLOZIN PROPANEDIOL 10 MG PO TABS: 10.0000 mg | ORAL_TABLET | Freq: Every day | ORAL | 1 refills | Status: AC

## 2023-04-03 NOTE — Telephone Encounter (Signed)
 Signed and placed in signed folder.

## 2023-04-03 NOTE — Telephone Encounter (Signed)
 Cala Bradford - I just saw the fax you sent requesting a prescription for Farxiga.  I've already sent it electronically to [facility].  Please let me know if you need anything else.

## 2023-04-03 NOTE — Telephone Encounter (Signed)
 Form faxed to [phone] as requested.  Placed in red folder to be scanned into chart.

## 2023-04-03 NOTE — Telephone Encounter (Signed)
 Patient part of the AZ[employer]ME PAP.  Refill for Farxiga sent to [facility], the pharmacy used to fill this PAP.

## 2023-04-03 NOTE — Telephone Encounter (Signed)
 Rec'd patient portion of APP for Trulicity (lilly) and faxed provider port to MD office.

## 2023-04-03 NOTE — Telephone Encounter (Signed)
 Received forms via fax.  Printed and placed in provider's box for signature.

## 2023-04-04 ENCOUNTER — Telehealth: Payer: Self-pay

## 2023-04-04 NOTE — Telephone Encounter (Signed)
 Forms received via e fax from pharmacy team needing provider signature. Form has been placed in provider to be signed folder

## 2023-04-04 NOTE — Telephone Encounter (Signed)
 Forms faxed to [phone] with a completed transmission log

## 2023-04-04 NOTE — Telephone Encounter (Signed)
 Form signed and placed in signed folder.

## 2023-04-05 ENCOUNTER — Telehealth: Payer: Self-pay

## 2023-04-05 DIAGNOSIS — I10 Essential (primary) hypertension: Secondary | ICD-10-CM | POA: Diagnosis not present

## 2023-04-05 DIAGNOSIS — E1122 Type 2 diabetes mellitus with diabetic chronic kidney disease: Secondary | ICD-10-CM | POA: Diagnosis not present

## 2023-04-05 DIAGNOSIS — N1831 Chronic kidney disease, stage 3a: Secondary | ICD-10-CM | POA: Diagnosis not present

## 2023-04-05 NOTE — Telephone Encounter (Signed)
 PAP: Application for Trulicity has been submitted to [employer], via fax

## 2023-04-09 NOTE — Telephone Encounter (Signed)
 PAP: Patient assistance application Trulicity  for has been approved by PAP Companies: [name] from 03/28/2023 to 03/26/2024. Medication should be delivered to PAP Delivery: Home For further shipping updates, please contact [name] at [phone] Pt ID is: [id number]

## 2023-04-10 ENCOUNTER — Encounter: Payer: PPO | Attending: Physical Medicine & Rehabilitation | Admitting: Physical Medicine & Rehabilitation

## 2023-04-10 ENCOUNTER — Encounter: Payer: Self-pay | Admitting: Physical Medicine & Rehabilitation

## 2023-04-10 VITALS — BP 165/76 | HR 65 | Ht 63.0 in | Wt 172.0 lb

## 2023-04-10 DIAGNOSIS — M7918 Myalgia, other site: Secondary | ICD-10-CM | POA: Insufficient documentation

## 2023-04-10 NOTE — Progress Notes (Signed)
 Subjective:    Patient ID: Danielle Park, female    DOB: 10-Sep-1950, 73 y.o.   MRN: [id number]  HPI CC:  Mid Back pain    73 year old female referred by primary care physician for complaints of mid back pain.  12yr hx of pain starting while she was pulling wire at work.  Symptoms occur with upper extremity use such as cooking or cleaning  No pain with walking  Her pain feels like it is between her shoulder blades.  Her sister told her it was her trapezius muscle.  Has not tried PT or any other treatment except for muscle relaxers .  Hip x-rays 12/16/2014 negative  Lumbar x-rays 08/13/2017 showed L5-S1 facet arthropathy as well as SI joint degenerative arthritis Pain Inventory Average Pain 10 Pain Right Now 0 My pain is dull and spasms  In the last 24 hours, has pain interfered with the following? General activity 2 Relation with others 0 Enjoyment of life 0 What TIME of day is your pain at its worst? varies Sleep (in general) Poor  Pain is worse with: some activites Pain improves with: rest and heat/ice Relief from Meds: 0  how many minutes can you walk? unlimited ability to climb steps?  no do you drive?  no  retired  spasms  Any changes since last visit?  no  Any changes since last visit?  no    Family History  Problem Relation Age of Onset   Hypertension Mother    Diabetes Mother    Heart disease Mother        s/p CABG x 2   Hypertension Father    Cancer Father        lung    Diabetes Sister    Heart disease Sister    Heart disease Sister    Cancer - Lung Brother    Cancer Sister        lung   Heart disease Other    Diabetes Other    Social History   Socioeconomic History   Marital status: Married    Spouse name: Not on file   Number of children: Not on file   Years of education: Not on file   Highest education level: Not on file  Occupational History   Not on file  Tobacco Use   Smoking status: Never   Smokeless tobacco: Never   Substance and Sexual Activity   Alcohol use: No    Alcohol/week: 0.0 standard drinks of alcohol   Drug use: No   Sexual activity: Not on file  Other Topics Concern   Not on file  Social History Narrative   Lives in [place] with husband. 1 dog      Work - Administrator, civil service   Diet - regular diet   Exercise - occasional, very active at work   Social Drivers of [employer]: Low Risk  (09/12/2021)   Overall Financial Resource Strain (CARDIA)    Difficulty of Paying Living Expenses: Not hard at all  Food Insecurity: No Food Insecurity (09/12/2021)   Hunger Vital Sign    Worried About Running Out of Food in the Last Year: Never true    Ran Out of Food in the Last Year: Never true  Transportation Needs: No Transportation Needs (09/12/2021)   PRAPARE - Administrator, Civil Service (Medical): No    Lack of Transportation (Non-Medical): No  Physical Activity: Sufficiently Active (09/12/2021)   Exercise Vital Sign  Days of Exercise per Week: 7 days    Minutes of Exercise per Session: 30 min  Stress: No Stress Concern Present (09/12/2021)   [employer] of Occupational Health - Occupational Stress Questionnaire    Feeling of Stress : Not at all  Social Connections: Unknown (09/12/2021)   Social Connection and Isolation Panel [NHANES]    Frequency of Communication with Friends and Family: Not on file    Frequency of Social Gatherings with Friends and Family: Not on file    Attends Religious Services: Not on file    Active Member of Clubs or Organizations: Not on file    Attends Banker Meetings: Not on file    Marital Status: Married   Past Surgical History:  Procedure Laterality Date   CESAREAN SECTION     PERIPHERAL VASCULAR CATHETERIZATION N/A 01/11/2015   Procedure: Abdominal Aortogram w/Lower Extremity;  Surgeon: Selinda GORMAN Gu, MD;  Location: [facility];  Service: Cardiovascular;  Laterality: N/A;   PERIPHERAL VASCULAR  CATHETERIZATION  01/11/2015   Procedure: Lower Extremity Intervention;  Surgeon: Selinda GORMAN Gu, MD;  Location: [facility];  Service: Cardiovascular;;   RENAL ARTERY STENT  2014   Dr. Gu at [facility]   VAGINAL DELIVERY     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   Congenital single kidney    Diabetes mellitus    Hypertension    UTI (urinary tract infection)    Ht 5' 3 (1.6 m)   Wt 172 lb (78 kg)   BMI 30.47 kg/m   Opioid Risk Score:   Fall Risk Score:  `1  Depression screen PHQ 2/9     02/05/2023    1:11 PM 07/31/2022    4:31 PM 12/12/2021   10:45 AM 09/12/2021    1:11 PM 06/10/2021   10:42 AM 01/31/2021   11:40 AM 07/27/2020   11:07 AM  Depression screen PHQ 2/9  Decreased Interest 0 0 0 0 0 0 0  Down, Depressed, Hopeless 0 0 0 0 0 0 0  PHQ - 2 Score 0 0 0 0 0 0 0  Altered sleeping 0 0       Tired, decreased energy 0 0       Change in appetite 0 0       Feeling bad or failure about yourself  0 0       Trouble concentrating 0 0       Moving slowly or fidgety/restless 0 0       Suicidal thoughts 0 0       PHQ-9 Score 0 0       Difficult doing work/chores Not difficult at all Not difficult at all           Review of Systems  Musculoskeletal:  Positive for back pain.  All other systems reviewed and are negative.     Objective:   Physical Exam  General No acute distress  Mood and affect are appropriate Extremities without edema Negative straight leg raising bilaterally Motor strength is 5/5 bilateral hip flexor knee extensor ankle dorsiflexor and plantar flexor Ambulates without assistive device no evidence of toe drag or knee instability She does not have any point tenderness around the periscapular musculature although she states that along the medial border of both scapula is usually where she feels her pain mainly at  the lower aspect.  No pain along the thoracic  spinous processes.  There is no evidence of thoracic kyphosis Cervical spine range of motion is normal Shoulder range of motion is normal and pain-free     Assessment & Plan:   1.  Chronic midthoracic pain her pain is in the region of T3-T7 along the medial scapular border.  I suspect that she has some insertional pain either from trapezius versus rhomboids due to her work-related injury from about 6 years ago.  We discussed that because the pain has been so chronic it is unlikely to totally resolve although I do think she may improve with physical therapy particularly with some dry needling. We discussed I do not feel there is any type of injection that would be helpful at this time given lack of point tenderness or discrete trigger point. I will see her back after she completes her physical therapy and reassess

## 2023-04-11 DIAGNOSIS — R319 Hematuria, unspecified: Secondary | ICD-10-CM | POA: Diagnosis not present

## 2023-04-11 DIAGNOSIS — N1831 Chronic kidney disease, stage 3a: Secondary | ICD-10-CM | POA: Diagnosis not present

## 2023-04-11 DIAGNOSIS — I701 Atherosclerosis of renal artery: Secondary | ICD-10-CM | POA: Diagnosis not present

## 2023-04-11 DIAGNOSIS — I129 Hypertensive chronic kidney disease with stage 1 through stage 4 chronic kidney disease, or unspecified chronic kidney disease: Secondary | ICD-10-CM | POA: Diagnosis not present

## 2023-04-11 DIAGNOSIS — R809 Proteinuria, unspecified: Secondary | ICD-10-CM | POA: Diagnosis not present

## 2023-04-11 DIAGNOSIS — E1122 Type 2 diabetes mellitus with diabetic chronic kidney disease: Secondary | ICD-10-CM | POA: Diagnosis not present

## 2023-05-03 LAB — HM DIABETES EYE EXAM

## 2023-05-05 ENCOUNTER — Ambulatory Visit
Admission: EM | Admit: 2023-05-05 | Discharge: 2023-05-05 | Disposition: A | Discharge status: Home / Self Care | Payer: PPO | Attending: Emergency Medicine | Admitting: Emergency Medicine

## 2023-05-05 DIAGNOSIS — U071 COVID-19: Secondary | ICD-10-CM

## 2023-05-05 LAB — POC COVID19/FLU A&B COMBO
Covid Antigen, POC: POSITIVE — AB
Influenza A Antigen, POC: NEGATIVE
Influenza B Antigen, POC: NEGATIVE

## 2023-05-05 MED ORDER — BENZONATATE 100 MG PO CAPS: 100.0000 mg | ORAL_CAPSULE | Freq: Three times a day (TID) | ORAL | 0 refills | Status: DC | PRN
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
## 2023-05-05 NOTE — ED Triage Notes (Signed)
 Patient to Urgent Care with complaints of dry cough/ drainage/ sore throat/ body aches/ runny nose/ fevers.  Symptoms started three days ago.   Meds: tylenol.

## 2023-05-05 NOTE — Discharge Instructions (Addendum)
 Your COVID test is positive.  Flu negative.    Take Tylenol as needed for fever and discomfort.  Take the Tessalon  Perles as needed for cough.  Rest and keep yourself hydrated.    Follow up with your primary care provider tomorrow.  Go to the emergency department if you have worsening symptoms.

## 2023-05-06 ENCOUNTER — Other Ambulatory Visit: Payer: Self-pay | Admitting: Family Medicine

## 2023-05-22 ENCOUNTER — Encounter: Payer: PPO | Admitting: Physical Medicine & Rehabilitation

## 2023-06-11 ENCOUNTER — Other Ambulatory Visit: Payer: Self-pay

## 2023-06-11 DIAGNOSIS — I150 Renovascular hypertension: Secondary | ICD-10-CM

## 2023-06-11 MED ORDER — ROSUVASTATIN CALCIUM 10 MG PO TABS: 10.0000 mg | ORAL_TABLET | Freq: Every day | ORAL | 0 refills | Status: DC

## 2023-06-11 MED ORDER — FUROSEMIDE 20 MG PO TABS: 20.0000 mg | ORAL_TABLET | Freq: Every day | ORAL | 0 refills | Status: DC

## 2023-06-11 MED ORDER — METOPROLOL TARTRATE 25 MG PO TABS: 25.0000 mg | ORAL_TABLET | Freq: Two times a day (BID) | ORAL | 0 refills | Status: DC

## 2023-06-11 MED ORDER — AMLODIPINE BESYLATE 5 MG PO TABS: 5.0000 mg | ORAL_TABLET | Freq: Every day | ORAL | 0 refills | Status: DC

## 2023-06-27 ENCOUNTER — Emergency Department
Admission: EM | Admit: 2023-06-27 | Discharge: 2023-06-27 | Disposition: A | Discharge status: Home / Self Care | Attending: Emergency Medicine | Admitting: Emergency Medicine

## 2023-06-27 ENCOUNTER — Emergency Department

## 2023-06-27 ENCOUNTER — Other Ambulatory Visit: Payer: Self-pay

## 2023-06-27 DIAGNOSIS — S0003XA Contusion of scalp, initial encounter: Secondary | ICD-10-CM | POA: Insufficient documentation

## 2023-06-27 DIAGNOSIS — S9001XA Contusion of right ankle, initial encounter: Secondary | ICD-10-CM | POA: Diagnosis not present

## 2023-06-27 DIAGNOSIS — N183 Chronic kidney disease, stage 3 unspecified: Secondary | ICD-10-CM | POA: Diagnosis not present

## 2023-06-27 DIAGNOSIS — S0990XA Unspecified injury of head, initial encounter: Secondary | ICD-10-CM | POA: Diagnosis present

## 2023-06-27 DIAGNOSIS — M25552 Pain in left hip: Secondary | ICD-10-CM | POA: Diagnosis not present

## 2023-06-27 DIAGNOSIS — S060X0A Concussion without loss of consciousness, initial encounter: Secondary | ICD-10-CM | POA: Insufficient documentation

## 2023-06-27 DIAGNOSIS — W108XXA Fall (on) (from) other stairs and steps, initial encounter: Secondary | ICD-10-CM | POA: Insufficient documentation

## 2023-06-27 DIAGNOSIS — W19XXXA Unspecified fall, initial encounter: Secondary | ICD-10-CM

## 2023-06-27 DIAGNOSIS — I129 Hypertensive chronic kidney disease with stage 1 through stage 4 chronic kidney disease, or unspecified chronic kidney disease: Secondary | ICD-10-CM | POA: Insufficient documentation

## 2023-06-27 DIAGNOSIS — E1122 Type 2 diabetes mellitus with diabetic chronic kidney disease: Secondary | ICD-10-CM | POA: Insufficient documentation

## 2023-06-27 MED ORDER — BACLOFEN 10 MG PO TABS: 10.0000 mg | ORAL_TABLET | Freq: Two times a day (BID) | ORAL | 0 refills | Status: DC

## 2023-06-27 NOTE — ED Provider Notes (Signed)
 [facility] Provider Note   Event Date/Time   First MD Initiated Contact with Patient 06/27/23 1503     (approximate) History  Fall  HPI Danielle Park is a 73 y.o. female with stated past medical history of type 2 diabetes, hypertension, and congenital solitary kidney with associated stage III CKD who presents after a mechanical fall down 1 stair after trying to move a table up it.  Patient states that she lost her balance causing the table to fall on the right ankle and left hip as well as striking the back of her head on concrete resulting in a hematoma.  Patient denies any loss of consciousness.  Patient denies any difficulty word finding or amnesia after the event.  Patient denies any nausea/vomiting after this event or any loss of consciousness. ROS: Patient currently denies any vision changes, tinnitus, difficulty speaking, facial droop, sore throat, chest pain, shortness of breath, abdominal pain, nausea/vomiting/diarrhea, dysuria, or weakness/numbness/paresthesias in any extremity   Physical Exam  Triage Vital Signs: ED Triage Vitals  Encounter Vitals Group     BP 06/27/23 1353 (!) 199/75     Systolic BP Percentile --      Diastolic BP Percentile --      Pulse Rate 06/27/23 1353 68     Resp 06/27/23 1353 18     Temp 06/27/23 1353 98 F (36.7 C)     Temp Source 06/27/23 1353 Oral     SpO2 06/27/23 1353 98 %     Weight 06/27/23 1354 165 lb (74.8 kg)     Height 06/27/23 1354 5\' 3"  (1.6 m)     Head Circumference --      Peak Flow --      Pain Score 06/27/23 1353 2     Pain Loc --      Pain Education --      Exclude from Growth Chart --    Most recent vital signs: Vitals:   06/27/23 1353 06/27/23 1547  BP: (!) 199/75 (!) 169/58  Pulse: 68 66  Resp: 18 18  Temp: 98 F (36.7 C)   SpO2: 98% 99%   General: Awake, oriented x4. CV:  Good peripheral perfusion.  Resp:  Normal effort.  Abd:  No distention.  Other:  Elderly overweight Caucasian  female resting comfortably in no acute distress.  4 cm x 5 cm palpable hematoma to the occipital scalp without any overlying skin breakdown.  Small amount of ecchymosis overlying the anterior proximal right ankle ED Results / Procedures / Treatments  Labs (all labs ordered are listed, but only abnormal results are displayed) Labs Reviewed - No data to display RADIOLOGY ED MD interpretation: X-ray of the left hip independently interpreted and shows no acute displaced fracture CT of the head without contrast interpreted by me shows no evidence of acute abnormalities including no intracerebral hemorrhage, obvious masses, or significant edema CT of the cervical spine interpreted by me does not show any evidence of acute abnormalities including no acute fracture, malalignment, height loss, or dislocation -Agree with radiology assessment Official radiology report(s): DG Hip Unilat W or Wo Pelvis 2-3 Views Left Result Date: 06/27/2023 CLINICAL DATA:  Larey Seat backwards, left hip pain EXAM: DG HIP (WITH OR WITHOUT PELVIS) 2-3V LEFT COMPARISON:  None Available. FINDINGS: Frontal view of the pelvis as well as frontal and frogleg lateral views of the left hip are obtained. No acute fracture, subluxation, or dislocation. Joint spaces are relatively well preserved. Sacroiliac joints are  unremarkable. Diffuse atherosclerosis. Stent in the right iliac distribution. IMPRESSION: 1. No acute displaced fracture. Electronically Signed   By: Sharlet Salina M.D.   On: 06/27/2023 16:17   CT CERVICAL SPINE WO CONTRAST Result Date: 06/27/2023 CLINICAL DATA:  Larey Seat downstairs, hit head, neck trauma EXAM: CT CERVICAL SPINE WITHOUT CONTRAST TECHNIQUE: Multidetector CT imaging of the cervical spine was performed without intravenous contrast. Multiplanar CT image reconstructions were also generated. RADIATION DOSE REDUCTION: This exam was performed according to the departmental dose-optimization program which includes automated exposure  control, adjustment of the mA and/or kV according to patient size and/or use of iterative reconstruction technique. COMPARISON:  None Available. FINDINGS: Alignment: Alignment is anatomic. Skull base and vertebrae: No acute fracture. No primary bone lesion or focal pathologic process. Soft tissues and spinal canal: No prevertebral fluid or swelling. No visible canal hematoma. Disc levels: There is mild diffuse cervical spondylosis greatest at C4-5, C5-6, and C6-7. No bony encroachment upon the central canal or neural foramina. Upper chest: Airway is patent. There are multiple small biapical pulmonary nodules, largest measuring 3 mm in the left apex image 73/2. Other: Reconstructed images demonstrate no additional findings. IMPRESSION: 1. No acute cervical spine fracture. 2. Multiple small biapical pulmonary nodules, largest measuring 3 mm. Further evaluation with nonemergent chest CT may be useful to assess the remainder of the lung parenchyma. Electronically Signed   By: Sharlet Salina M.D.   On: 06/27/2023 16:16   CT Head Wo Contrast Result Date: 06/27/2023 CLINICAL DATA:  Larey Seat downstairs, hit head, anticoagulated, scalp hematoma EXAM: CT HEAD WITHOUT CONTRAST TECHNIQUE: Contiguous axial images were obtained from the base of the skull through the vertex without intravenous contrast. RADIATION DOSE REDUCTION: This exam was performed according to the departmental dose-optimization program which includes automated exposure control, adjustment of the mA and/or kV according to patient size and/or use of iterative reconstruction technique. COMPARISON:  None Available. FINDINGS: Brain: No acute infarct or hemorrhage. Lateral ventricles and midline structures are unremarkable. No acute extra-axial fluid collections. No mass effect. Vascular: No hyperdense vessel or unexpected calcification. Skull: Small scalp hematoma within the midline toward the vertex. No underlying fracture. The remainder of the calvarium is  unremarkable. Sinuses/Orbits: No acute finding. Other: None. IMPRESSION: 1. Small scalp hematoma toward the vertex.  No underlying fracture. 2. No acute intracranial process. Electronically Signed   By: Sharlet Salina M.D.   On: 06/27/2023 16:13   PROCEDURES: Critical Care performed: No Procedures MEDICATIONS ORDERED IN ED: Medications - No data to display IMPRESSION / MDM / ASSESSMENT AND PLAN / ED COURSE  I reviewed the triage vital signs and the nursing notes.                             The patient is on the cardiac monitor to evaluate for evidence of arrhythmia and/or significant heart rate changes. Patient's presentation is most consistent with acute presentation with potential threat to life or bodily function. Presenting after a fall that occurred just prior to arrival, resulting in injury to the occipital scalp, left hip. The mechanism of injury was a mechanical ground level fall without syncope or near-syncope. The current level of pain is moderate. There was no loss of consciousness, confusion, seizure, or memory impairment. There is not a laceration associated with the injury. Denies neck pain. The patient does not take blood thinner medications.  Patient only takes Plavix and aspirin Denies vomiting, numbness/weakness, fever  Dispo:  Discharge with PCP follow-up       FINAL CLINICAL IMPRESSION(S) / ED DIAGNOSES   Final diagnoses:  Hematoma of scalp, initial encounter  Fall, initial encounter  Left hip pain  Concussion without loss of consciousness, initial encounter   Rx / DC Orders   ED Discharge Orders          Ordered    baclofen (LIORESAL) 10 MG tablet  2 times daily        06/27/23 1704           Note:  This document was prepared using Dragon voice recognition software and may include unintentional dictation errors.   Merwyn Katos, MD 06/27/23 [phone]

## 2023-06-27 NOTE — Discharge Instructions (Addendum)
 Please use ibuprofen (Motrin) up to 800 mg every 8 hours, naproxen (Naprosyn) up to 500 mg every 12 hours, and/or acetaminophen (Tylenol) up to 4 g/day for any continued pain.  Please do not use this medication regimen for longer than 7 days

## 2023-06-27 NOTE — ED Triage Notes (Signed)
 Pt presents to the ED POV from home with daughter. Pt fell backwards down four stairs and hit her head on the dirt. Pt does take plavix. Pt denies LOC. Pt has a raised hematoma on back of head. Pt ambulatory to exam room. Reports some hip soreness. A&Ox4. Pupils round, reactive and equal.

## 2023-06-27 NOTE — ED Notes (Signed)
 Patient transported to X-ray

## 2023-07-03 ENCOUNTER — Other Ambulatory Visit: Payer: Self-pay | Admitting: Internal Medicine

## 2023-07-03 DIAGNOSIS — I150 Renovascular hypertension: Secondary | ICD-10-CM

## 2023-07-10 ENCOUNTER — Encounter: Payer: PPO | Admitting: Family Medicine

## 2023-07-16 ENCOUNTER — Ambulatory Visit (INDEPENDENT_AMBULATORY_CARE_PROVIDER_SITE_OTHER)

## 2023-07-16 VITALS — BP 120/54 | HR 80 | Temp 98.4°F | Ht 63.0 in | Wt 168.4 lb

## 2023-07-16 DIAGNOSIS — I150 Renovascular hypertension: Secondary | ICD-10-CM

## 2023-07-16 DIAGNOSIS — Z7985 Long-term (current) use of injectable non-insulin antidiabetic drugs: Secondary | ICD-10-CM

## 2023-07-16 DIAGNOSIS — I152 Hypertension secondary to endocrine disorders: Secondary | ICD-10-CM | POA: Diagnosis not present

## 2023-07-16 DIAGNOSIS — F4329 Adjustment disorder with other symptoms: Secondary | ICD-10-CM

## 2023-07-16 DIAGNOSIS — N1831 Chronic kidney disease, stage 3a: Secondary | ICD-10-CM | POA: Insufficient documentation

## 2023-07-16 DIAGNOSIS — Z7984 Long term (current) use of oral hypoglycemic drugs: Secondary | ICD-10-CM

## 2023-07-16 DIAGNOSIS — R42 Dizziness and giddiness: Secondary | ICD-10-CM | POA: Diagnosis not present

## 2023-07-16 DIAGNOSIS — E1159 Type 2 diabetes mellitus with other circulatory complications: Secondary | ICD-10-CM | POA: Diagnosis not present

## 2023-07-16 NOTE — Patient Instructions (Signed)
 1) Stop Amlodipine  and Hydralazine .  2) Check Blood pressure everyday and write it down to review during your appointment in 2 weeks.

## 2023-07-16 NOTE — Assessment & Plan Note (Signed)
 Tearful affect, insomnia. I believe patient will benefit from counseling sessions. We also discussed about pharmacological intervention for insomnia an potential s/e. Patient agreeable for referral.

## 2023-07-16 NOTE — Assessment & Plan Note (Signed)
 Chronic, plan as mentioned in renovascular hypertension.

## 2023-07-16 NOTE — Progress Notes (Signed)
 Acute Office Visit  Subjective:    Patient ID: Danielle Park, female    DOB: 03-18-1951, 73 y.o.   MRN: [id number]  Chief Complaint  Patient presents with   Fall   Dizziness   HPI Patient is in today for ER follow up. She was seen at [facility] on 06/27/23 after sustaining a mechanical fall when trying to move a table up the stairs. She lost balance, this episode was not related with patient feeling dizzy before the fall. She had hematoma on the back of her head. She also had pain on left hip and ankle. She had CT head, cervical spine which showed scalp hematoma but otherwise normal finding. She also had x-ray of left hip was negative for fracture. She reports hip pain is resolved. She does not have a headache. Patient continues to have episodic positional dizziness that started around 2022.   Patient lost her husband last summer. Since then she has not been sleeping well. She states she is doing everything to take care of herself and her son who is living with her. Her son has thyroid  cancer, has trach tube. Patient is on Amlodipine , Hydralazine , Lasix , Lisinopril  and Metoprolol  daily. She tries to go for walks, eat healthy food but reports she can improve daily water intake. Patient has not taken antihypertensive medication since last night.   Patient has had insomnia since she was young. She was started on Trazodone  50 mg. Patient reports this really didn't help with her sleep. She gets racing thoughts at night that interferes with her sleep.   She denies chest pain, palpitations, lower leg edema, dyspnea. Patient's medical h/o reports she has h/o ET tube dysfunction, anxiety, depression. She also reports of h/o vertigo.   ROS As per HPI    Objective:    BP (!) 120/54   Pulse 80   Temp 98.4 F (36.9 C) (Oral)   Ht 5\' 3"  (1.6 m)   Wt 168 lb 6.4 oz (76.4 kg)   SpO2 96%   BMI 29.83 kg/m    Physical Exam Constitutional:      Appearance: Normal appearance.   HENT:     Head: Normocephalic and atraumatic.  Cardiovascular:     Rate and Rhythm: Normal rate.     Heart sounds: No murmur heard. Pulmonary:     Effort: Pulmonary effort is normal.     Breath sounds: Normal breath sounds.  Abdominal:     General: Bowel sounds are normal.     Palpations: Abdomen is soft.  Musculoskeletal:     Cervical back: Neck supple. No rigidity.  Neurological:     Mental Status: She is alert and oriented to person, place, and time.  Psychiatric:        Mood and Affect: Mood is depressed. Affect is tearful.        Behavior: Behavior is cooperative.     No results found for any visits on 07/16/23.     Assessment & Plan:  Patient is a pleasant 73 year old female presenting for ER follow up.    Prolonged grief reaction Assessment & Plan: Tearful affect, insomnia. I believe patient will benefit from counseling sessions. We also discussed about pharmacological intervention for insomnia an potential s/e. Patient agreeable for referral.    Orders: -     Ambulatory referral to Psychology  Renovascular hypertension Assessment & Plan: Chronic issue.  Patient is on multiple antihypertensive medications including Amlodipine  5 mg daily, Lasix  20 mg daily,  hydralazine  25 mg 3 times daily, metoprolol  25 mg twice daily, and lisinopril  40 mg daily. Her BP today is 120/54 mmHg. Her goal BP is 130/80 mmHg. I recommend given patient's age, risk of medication s/e we discontinue Amlodipine  and Hydralazine . She can continue to take Metoprolol  25 mg BID, Lisinopril  40 mg daily and Lasix  20 mg daily. I also recommend patient keep a blood pressure log and review BP during her next visit with me in 2 weeks.    Dizziness Assessment & Plan: Chronic, plan as mentioned in renovascular hypertension.     Return in about 2 weeks (around 07/30/2023) for BP check with Dr. Casimir Cleaver.  Jacklin Mascot, MD

## 2023-07-16 NOTE — Assessment & Plan Note (Addendum)
>>  ASSESSMENT AND PLAN FOR RENOVASCULAR HYPERTENSION WRITTEN ON 07/16/2023  2:51 PM BY Lexis Potenza, MD  Chronic issue.  Patient is on multiple antihypertensive medications including Amlodipine  5 mg daily, Lasix  20 mg daily, hydralazine  25 mg 3 times daily, metoprolol  25 mg twice daily, and lisinopril  40 mg daily. Her BP today is 120/54 mmHg. Her goal BP is 130/80 mmHg. I recommend given patient's age, risk of medication s/e we discontinue Amlodipine  and Hydralazine . She can continue to take Metoprolol  25 mg BID, Lisinopril  40 mg daily and Lasix  20 mg daily. I also recommend patient keep a blood pressure log and review BP during her next visit with me in 2 weeks.    >>ASSESSMENT AND PLAN FOR HYPERTENSION ASSOCIATED WITH DIABETES (HCC) WRITTEN ON 07/30/2023  1:13 PM BY ABBEY BRUCKNER, MD  Chronic issue.  Patient is on multiple antihypertensive medications including Amlodipine  5 mg daily, Lasix  20 mg daily, hydralazine  25 mg 3 times daily, metoprolol  25 mg twice daily, and lisinopril  40 mg daily. Her BP today is 120/54 mmHg. Her goal BP is 130/80 mmHg. I recommend given patient's age, risk of medication s/e we discontinue Amlodipine  and Hydralazine . She can continue to take Metoprolol  25 mg BID, Lisinopril  40 mg daily and Lasix  20 mg daily. I also recommend patient keep a blood pressure log and review BP during her next visit with me in 2 weeks.

## 2023-07-30 ENCOUNTER — Ambulatory Visit (INDEPENDENT_AMBULATORY_CARE_PROVIDER_SITE_OTHER)

## 2023-07-30 VITALS — BP 142/84 | HR 66 | Temp 98.9°F | Ht 63.0 in | Wt 165.0 lb

## 2023-07-30 DIAGNOSIS — Z794 Long term (current) use of insulin: Secondary | ICD-10-CM

## 2023-07-30 DIAGNOSIS — M546 Pain in thoracic spine: Secondary | ICD-10-CM

## 2023-07-30 DIAGNOSIS — I150 Renovascular hypertension: Secondary | ICD-10-CM

## 2023-07-30 DIAGNOSIS — I7025 Atherosclerosis of native arteries of other extremities with ulceration: Secondary | ICD-10-CM | POA: Diagnosis not present

## 2023-07-30 DIAGNOSIS — N1831 Chronic kidney disease, stage 3a: Secondary | ICD-10-CM | POA: Diagnosis not present

## 2023-07-30 DIAGNOSIS — N1832 Chronic kidney disease, stage 3b: Secondary | ICD-10-CM

## 2023-07-30 DIAGNOSIS — G8929 Other chronic pain: Secondary | ICD-10-CM

## 2023-07-30 DIAGNOSIS — E1122 Type 2 diabetes mellitus with diabetic chronic kidney disease: Secondary | ICD-10-CM

## 2023-07-30 LAB — BASIC METABOLIC PANEL WITH GFR
BUN: 23 mg/dL (ref 6–23)
CO2: 27 meq/L (ref 19–32)
Calcium: 9.8 mg/dL (ref 8.4–10.5)
Chloride: 100 meq/L (ref 96–112)
Creatinine, Ser: 1.35 mg/dL — ABNORMAL HIGH (ref 0.40–1.20)
GFR: 39.25 mL/min — ABNORMAL LOW (ref >60.00)
Glucose, Bld: 116 mg/dL — ABNORMAL HIGH (ref 70–99)
Potassium: 4.7 meq/L (ref 3.5–5.1)
Sodium: 135 meq/L (ref 135–145)

## 2023-07-30 LAB — HEMOGLOBIN A1C: Hgb A1c MFr Bld: 5.6 % (ref 4.6–6.5)

## 2023-07-30 NOTE — Progress Notes (Signed)
 Established Patient Office Visit   Subjective  Patient ID: Danielle Park, female    DOB: 07-13-1950  Age: 73 y.o. MRN: [id number]  Chief Complaint  Patient presents with   Hypertension   Back Pain    She  has a past medical history of Chicken pox, Congenital doubling of uterus, Congenital single kidney, Diabetes mellitus, Hypertension, and UTI (urinary tract infection).  HPI Patient presents for hypertension follow up.  1) HTN, renovascular:  - Last seen by me on 07/16/23 for ED follow up for dizziness. Following medications were discontinued during her visit at that time: Hydralazine  25 mg 3 times a day and Amlodipine  5 mg - Patient reports since her visit with me and d/c above medications she has noted improvement in dizziness.  - She has been checking daily BP at home and has been ranging the following:  SBP: 133-94 mmHg and DBP: 75-52 mmHg except for one BP reading from this morning which was 172/75 mmHg.  - Denies chest pain, headache.  - Currently she is on:  Lasix  20 mg daily Metoprolol  25 mg twice daily Lisinopril  40 mg daily - Family h/o CAD: Mom with h/o CABG, MI in her older sister, younger sister with valve replacement surgery. She has not seen cardiology in the past and is requesting cardiology referral.   2) Prolonged grief: Was referred to behavioral health for counseling during her last visit. Patient reports she did talk to them and declined appointment. Patient reports she is feeling somewhat better but is not ready to open up about grief at this time.   3) Recurrent back spasm and pain: Patient reports she has had mid back pain for few years now usually stable. She reports of back pain flair-up about 5 days, triggered by exercise that included bending forward. Patient has not done back physical therapy. No numbness tinglings of arms, legs, no change in bowel/bladder. She took 1/2 tablet of codeine that was her husband's prescription on Saturday morning which helped  with the pain. She also took Baclofen  10 mg twice a day which is prescribed to her by her previous PCP which did not help with the pain.  4) H/O renal artery stenosis s/p renal artery stent, with  CKD with proteinuria: Sees Dr.  Lamount Pimple with [facility]. Last visit with him was on 04/05/23. She had BMP, urine protein:cr checked in 04/05/23. On DAPT.   5) DM II:  - Currently on Farxiga , Trulicity , Lisinopril , Rosuvastatin .   ROS As per HPI    Objective:     BP (!) 142/84   Pulse 66   Temp 98.9 F (37.2 C) (Oral)   Ht 5\' 3"  (1.6 m)   Wt 165 lb (74.8 kg)   SpO2 98%   BMI 29.23 kg/m      07/30/2023    1:06 PM 02/05/2023    1:11 PM 07/31/2022    4:31 PM  Depression screen PHQ 2/9  Decreased Interest 0 0 0  Down, Depressed, Hopeless 0 0 0  PHQ - 2 Score 0 0 0  Altered sleeping 0 0 0  Tired, decreased energy 0 0 0  Change in appetite 0 0 0  Feeling bad or failure about yourself  0 0 0  Trouble concentrating 0 0 0  Moving slowly or fidgety/restless 0 0 0  Suicidal thoughts 0 0 0  PHQ-9 Score 0 0 0  Difficult doing work/chores  Not difficult at all Not difficult at all  07/30/2023    1:06 PM 02/05/2023    1:11 PM 07/31/2022    4:31 PM  GAD 7 : Generalized Anxiety Score  Nervous, Anxious, on Edge 0 0 0  Control/stop worrying 0 0 0  Worry too much - different things 0 0 0  Trouble relaxing 0 0 0  Restless 0 0 0  Easily annoyed or irritable 0 0 0  Afraid - awful might happen 0 0 0  Total GAD 7 Score 0 0 0  Anxiety Difficulty  Not difficult at all Not difficult at all    Physical Exam Constitutional:      Appearance: Normal appearance.  HENT:     Head: Normocephalic and atraumatic.     Mouth/Throat:     Mouth: Mucous membranes are moist.  Neck:     Thyroid : No thyroid  mass or thyroid  tenderness.  Cardiovascular:     Rate and Rhythm: Normal rate and regular rhythm.  Pulmonary:     Effort: Pulmonary effort is normal.     Breath sounds: Normal breath sounds.   Abdominal:     General: Bowel sounds are normal.     Palpations: Abdomen is soft.  Musculoskeletal:     Cervical back: Neck supple. Normal range of motion.     Thoracic back: Spasms (bilateral thoracic paraspinal muscle spasm noted.) present. No bony tenderness.     Right lower leg: No edema.     Left lower leg: No edema.  Skin:    General: Skin is warm.  Neurological:     Mental Status: She is alert and oriented to person, place, and time.  Psychiatric:        Mood and Affect: Mood normal.        Behavior: Behavior normal.        No results found for any visits on 07/30/23.  The 10-year ASCVD risk score (Arnett DK, et al., 2019) is: 30.6%    Assessment & Plan:  Renovascular hypertension Assessment & Plan: Continue f/u with nephrology. Continue following medications:  Lasix  20 mg daily Metoprolol  25 mg twice daily Lisinopril  40 mg daily Goal BP <130/80 mmHg.  Given significant family h/o CAD, on multiple antihypertensive medications and patient requesting cardiology referral, ambulatory cardiology referral was made.   Orders: -     Ambulatory referral to Cardiology  Type 2 diabetes mellitus with stage 3a chronic kidney disease, with long-term current use of insulin  (HCC) Assessment & Plan: Chronic issue.   Blood glucose well controlled.  Continue Farxiga  10 mg daily and Trulicity  4.5 mg weekly. Check A1c today.  Orders: -     Hemoglobin A1c  Stage 3b chronic kidney disease (HCC) Assessment & Plan: Continue f/u with nephrology. BMP today ordered.   Orders: -     Basic metabolic panel with GFR  Chronic bilateral thoracic back pain Assessment & Plan: Acute on chronic issue.  Offered physical therapy and pain clinic referral. Patient was more inclined to see pain clinic but I believe she will benefit from both PT and pain clinic evaluation. Referral made.    Counseled against taking any medication that is not prescribed to the patient. Medication safety  discussed including r/o addiction, adverse reactions, drug interactions. Patient verbalized understanding and agreed to avoid using others prescriptions in the future.   Orders: -     Ambulatory referral to Pain Clinic -     Ambulatory referral to Physical Therapy    Return in about 7 weeks (around 09/19/2023).   Shemeca Lukasik  Chance Munter, MD

## 2023-07-30 NOTE — Assessment & Plan Note (Signed)
 Chronic issue.   Blood glucose well controlled.  Continue Farxiga  10 mg daily and Trulicity  4.5 mg weekly. Check A1c today.

## 2023-07-30 NOTE — Assessment & Plan Note (Signed)
 Chronic issue.  Patient is on multiple antihypertensive medications including Amlodipine  5 mg daily, Lasix  20 mg daily, hydralazine  25 mg 3 times daily, metoprolol  25 mg twice daily, and lisinopril  40 mg daily. Her BP today is 120/54 mmHg. Her goal BP is 130/80 mmHg. I recommend given patient's age, risk of medication s/e we discontinue Amlodipine  and Hydralazine . She can continue to take Metoprolol  25 mg BID, Lisinopril  40 mg daily and Lasix  20 mg daily. I also recommend patient keep a blood pressure log and review BP during her next visit with me in 2 weeks.

## 2023-07-30 NOTE — Patient Instructions (Signed)
--   I am referring you to physical therapy clinic to help strengthen the back muscle. I am also referring you to the pain clinic on your request.   -- I am also referring you to cardiology today given history of uncontrolled blood pressure, family history.   -- If you do not hear from both of these palaces in 2 weeks please let us  know.

## 2023-07-30 NOTE — Assessment & Plan Note (Signed)
 Continue f/u with nephrology. Continue following medications:  Lasix  20 mg daily Metoprolol  25 mg twice daily Lisinopril  40 mg daily Goal BP <130/80 mmHg.  Given significant family h/o CAD, on multiple antihypertensive medications and patient requesting cardiology referral, ambulatory cardiology referral was made.

## 2023-07-30 NOTE — Assessment & Plan Note (Signed)
 Continue f/u with nephrology. BMP today ordered.

## 2023-07-30 NOTE — Assessment & Plan Note (Addendum)
 Acute on chronic issue.  Offered physical therapy and pain clinic referral. Patient was more inclined to see pain clinic but I believe she will benefit from both PT and pain clinic evaluation. Referral made.    Counseled against taking any medication that is not prescribed to the patient. Medication safety discussed including r/o addiction, adverse reactions, drug interactions. Patient verbalized understanding and agreed to avoid using others prescriptions in the future.

## 2023-08-02 ENCOUNTER — Ambulatory Visit: Attending: Cardiology | Admitting: Cardiology

## 2023-08-02 ENCOUNTER — Encounter: Payer: Self-pay | Admitting: Cardiology

## 2023-08-02 VITALS — BP 116/74 | HR 64 | Ht 63.0 in | Wt 166.4 lb

## 2023-08-02 DIAGNOSIS — I150 Renovascular hypertension: Secondary | ICD-10-CM

## 2023-08-02 DIAGNOSIS — I739 Peripheral vascular disease, unspecified: Secondary | ICD-10-CM | POA: Diagnosis not present

## 2023-08-02 DIAGNOSIS — I2510 Atherosclerotic heart disease of native coronary artery without angina pectoris: Secondary | ICD-10-CM

## 2023-08-02 NOTE — Patient Instructions (Signed)
 Medication Instructions:  Your Physician recommend you continue on your current medication as directed.    *If you need a refill on your cardiac medications before your next appointment, please call your pharmacy*  Lab Work: No labs ordered today  If you have labs (blood work) drawn today and your tests are completely normal, you will receive your results only by: MyChart Message (if you have MyChart) OR A paper copy in the mail If you have any lab test that is abnormal or we need to change your treatment, we will call you to review the results.  Testing/Procedures: Your physician has requested that you have an echocardiogram. Echocardiography is a painless test that uses sound waves to create images of your heart. It provides your doctor with information about the size and shape of your heart and how well your heart's chambers and valves are working.   You may receive an ultrasound enhancing agent through an IV if needed to better visualize your heart during the echo. This procedure takes approximately one hour.  There are no restrictions for this procedure.  This will take place at [address] [facility] [address] [facility]) [address], [place] [address]  Please note: We ask at that you not bring children with you during ultrasound (echo/ vascular) testing. Due to room size and safety concerns, children are not allowed in the ultrasound rooms during exams. Our front office staff cannot provide observation of children in our lobby area while testing is being conducted. An adult accompanying a patient to their appointment will only be allowed in the ultrasound room at the discretion of the ultrasound technician under special circumstances. We apologize for any inconvenience.   Follow-Up: At [facility], you and your health needs are our priority.  As part of our continuing mission to provide you with exceptional heart care, our providers are all part of one team.  This team includes your  primary Cardiologist (physician) and Advanced Practice Providers or APPs (Physician Assistants and Nurse Practitioners) who all work together to provide you with the care you need, when you need it.  Your next appointment:   3 month(s)  Provider:   You may see Dr. Junnie Olives or one of the following Advanced Practice Providers on your designated Care Team:   Laneta Pintos, NP Gildardo Labrador, PA-C Varney Gentleman, PA-C Cadence Cardwell, PA-C Ronald Cockayne, NP Morey Ar, NP    We recommend signing up for the patient portal called "MyChart".  Sign up information is provided on this After Visit Summary.  MyChart is used to connect with patients for Virtual Visits (Telemedicine).  Patients are able to view lab/test results, encounter notes, upcoming appointments, etc.  Non-urgent messages can be sent to your provider as well.   To learn more about what you can do with MyChart, go to [email or web address].

## 2023-08-02 NOTE — Progress Notes (Signed)
 Cardiology Office Note:    Date:  08/02/2023   ID:  Danielle Park, DOB 1950-11-18, MRN [id number]  PCP:  Jacklin Mascot, MD   [facility] Providers Cardiologist:  None     Referring MD: Jacklin Mascot, MD   No chief complaint on file.   History of Present Illness:    Danielle Park is a 73 y.o. female with a hx of hypertension, diabetes, congenital absence of left kidney, CKD, PAD s/p right renal arterial stent 2014, who presents due to hypertension.  Patient has a history of renal arterial stenosis, renal arterial stent was placed back in 2014.  Denies chest pain or shortness of breath.  Due to vascular disease, she would like to know if her heart is okay.  Her blood pressures have been labile of late, occasional systolics in the 80s.  Endorse being dehydrated when her BP is typically low.  Renal arterial ultrasound 09/2022 no significant hemodynamic stenosis of right renal artery.  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   Congenital single kidney    Diabetes mellitus    Hypertension    UTI (urinary tract infection)     Past Surgical History:  Procedure Laterality Date   CESAREAN SECTION     PERIPHERAL VASCULAR CATHETERIZATION N/A 01/11/2015   Procedure: Abdominal Aortogram w/Lower Extremity;  Surgeon: Celso College, MD;  Location: [facility] INVASIVE CV LAB;  Service: Cardiovascular;  Laterality: N/A;   PERIPHERAL VASCULAR CATHETERIZATION  01/11/2015   Procedure: Lower Extremity Intervention;  Surgeon: Celso College, MD;  Location: [facility] INVASIVE CV LAB;  Service: Cardiovascular;;   RENAL ARTERY STENT  2014   Dr. Vonna Guardian at [facility]   VAGINAL DELIVERY      Current Medications: Current Meds  Medication Sig   aspirin 81 MG tablet Take 81 mg by mouth daily.   cholecalciferol (VITAMIN D3) 25 MCG (1000 UT) tablet Take 2,000 Units by mouth daily.   clopidogrel  (PLAVIX ) 75 MG tablet  TAKE 1 TABLET BY MOUTH EVERY DAY   dapagliflozin  propanediol (FARXIGA ) 10 MG TABS tablet Take 1 tablet (10 mg total) by mouth daily before breakfast.   Dulaglutide  (TRULICITY ) 4.5 MG/0.5ML SOPN INJECT 4.5MG  (0.5ML) UNDER THE SKIN ONCE A WEEK   furosemide  (LASIX ) 20 MG tablet TAKE 1 TABLET BY MOUTH EVERY DAY   levothyroxine  (SYNTHROID ) 75 MCG tablet TAKE ONE TABLET BY MOUTH ONCE DAILY BEFORE BREAKFAST   lisinopril  (ZESTRIL ) 40 MG tablet TAKE 1 TABLET BY MOUTH EVERY DAY   metoprolol  tartrate (LOPRESSOR ) 25 MG tablet TAKE 1 TABLET BY MOUTH TWICE A DAY   rosuvastatin  (CRESTOR ) 10 MG tablet TAKE 1 TABLET BY MOUTH EVERY DAY     Allergies:   Patient has no known allergies.   Social History   Socioeconomic History   Marital status: Widowed    Spouse name: Not on file   Number of children: Not on file   Years of education: Not on file   Highest education level: Not on file  Occupational History   Not on file  Tobacco Use   Smoking status: Never   Smokeless tobacco: Never  Substance and Sexual Activity   Alcohol use: No    Alcohol/week: 0.0 standard drinks of alcohol   Drug use: No   Sexual activity: Not on file  Other Topics Concern   Not on file  Social History Narrative  Lives in [place] with husband. 1 dog      Work - Administrator, Civil Service   Diet - regular diet   Exercise - occasional, very active at work   Social Drivers of [employer]: Low Risk  (09/12/2021)   Overall Financial Resource Strain (CARDIA)    Difficulty of Paying Living Expenses: Not hard at all  Food Insecurity: No Food Insecurity (09/12/2021)   Hunger Vital Sign    Worried About Running Out of Food in the Last Year: Never true    Ran Out of Food in the Last Year: Never true  Transportation Needs: No Transportation Needs (09/12/2021)   PRAPARE - Administrator, Civil Service (Medical): No    Lack of Transportation (Non-Medical): No  Physical Activity: Sufficiently Active (09/12/2021)    Exercise Vital Sign    Days of Exercise per Week: 7 days    Minutes of Exercise per Session: 30 min  Stress: No Stress Concern Present (09/12/2021)   [employer] of Occupational Health - Occupational Stress Questionnaire    Feeling of Stress : Not at all  Social Connections: Unknown (09/12/2021)   Social Connection and Isolation Panel [NHANES]    Frequency of Communication with Friends and Family: Not on file    Frequency of Social Gatherings with Friends and Family: Not on file    Attends Religious Services: Not on file    Active Member of Clubs or Organizations: Not on file    Attends Banker Meetings: Not on file    Marital Status: Married     Family History: The patient's family history includes Cancer in her father and sister; Cancer - Lung in her brother; Diabetes in her mother, sister, and another family member; Heart disease in her mother, sister, sister, and another family member; Hypertension in her father and mother.  ROS:   Please see the history of present illness.     All other systems reviewed and are negative.  EKGs/Labs/Other Studies Reviewed:    The following studies were reviewed today:  EKG Interpretation Date/Time:  Thursday Aug 02 2023 11:00:28 EDT Ventricular Rate:  64 PR Interval:  214 QRS Duration:  96 QT Interval:  410 QTC Calculation: 422 R Axis:   71  Text Interpretation: Sinus rhythm with 1st degree A-V block Confirmed by Constancia Delton ([id number]) on 08/02/2023 11:13:58 AM    Recent Labs: 02/05/2023: ALT 5; TSH 2.25 07/30/2023: BUN 23; Creatinine, Ser 1.35; Potassium 4.7; Sodium 135  Recent Lipid Panel    Component Value Date/Time   CHOL 135 02/05/2023 1336   TRIG 109.0 02/05/2023 1336   HDL 60.60 02/05/2023 1336   CHOLHDL 2 02/05/2023 1336   VLDL 21.8 02/05/2023 1336   LDLCALC 52 02/05/2023 1336   LDLDIRECT 59.0 10/02/2018 0909     Risk Assessment/Calculations:             Physical Exam:    VS:  BP 116/74    Pulse 64   Ht 5\' 3"  (1.6 m)   Wt 166 lb 6.4 oz (75.5 kg)   SpO2 99%   BMI 29.48 kg/m     Wt Readings from Last 3 Encounters:  08/02/23 166 lb 6.4 oz (75.5 kg)  07/30/23 165 lb (74.8 kg)  07/16/23 168 lb 6.4 oz (76.4 kg)     GEN:  Well nourished, well developed in no acute distress HEENT: Normal NECK: No JVD; No carotid bruits CARDIAC: RRR, no murmurs, rubs, gallops RESPIRATORY:  Clear to auscultation without rales, wheezing or rhonchi  ABDOMEN: Soft, non-tender, non-distended MUSCULOSKELETAL:  No edema; No deformity  SKIN: Warm and dry NEUROLOGIC:  Alert and oriented x 3 PSYCHIATRIC:  Normal affect   ASSESSMENT:    1. ASCVD (arteriosclerotic cardiovascular disease)   2. Renovascular hypertension   3. PAD (peripheral artery disease) (HCC)    PLAN:    In order of problems listed above:  ASCVD, history of renal artery stent obtain echo to evaluate any cardiac dysfunction.  Patient is otherwise asymptomatic.  No indication for ischemic workup. Renovascular hypertension, CKD.  BP adequately controlled.  Occasional low BP at home associated with dehydration.  Continue lisinopril  40 mg daily, Lasix  20 mg daily. PAD s/p right renal artery stent.  Continue aspirin, Plavix , Crestor  10 mg daily.  Follows up with vascular surgery.  Follow-up after echocardiogram     Medication Adjustments/Labs and Tests Ordered: Current medicines are reviewed at length with the patient today.  Concerns regarding medicines are outlined above.  Orders Placed This Encounter  Procedures   EKG 12-Lead   ECHOCARDIOGRAM COMPLETE   No orders of the defined types were placed in this encounter.   Patient Instructions  Medication Instructions:  Your Physician recommend you continue on your current medication as directed.    *If you need a refill on your cardiac medications before your next appointment, please call your pharmacy*  Lab Work: No labs ordered today  If you have labs (blood work) drawn  today and your tests are completely normal, you will receive your results only by: MyChart Message (if you have MyChart) OR A paper copy in the mail If you have any lab test that is abnormal or we need to change your treatment, we will call you to review the results.  Testing/Procedures: Your physician has requested that you have an echocardiogram. Echocardiography is a painless test that uses sound waves to create images of your heart. It provides your doctor with information about the size and shape of your heart and how well your heart's chambers and valves are working.   You may receive an ultrasound enhancing agent through an IV if needed to better visualize your heart during the echo. This procedure takes approximately one hour.  There are no restrictions for this procedure.  This will take place at [address] [facility]) [address], [place] [address]  Please note: We ask at that you not bring children with you during ultrasound (echo/ vascular) testing. Due to room size and safety concerns, children are not allowed in the ultrasound rooms during exams. Our front office staff cannot provide observation of children in our lobby area while testing is being conducted. An adult accompanying a patient to their appointment will only be allowed in the ultrasound room at the discretion of the ultrasound technician under special circumstances. We apologize for any inconvenience.   Follow-Up: At [facility], you and your health needs are our priority.  As part of our continuing mission to provide you with exceptional heart care, our providers are all part of one team.  This team includes your primary Cardiologist (physician) and Advanced Practice Providers or APPs (Physician Assistants and Nurse Practitioners) who all work together to provide you with the care you need, when you need it.  Your next appointment:   3 month(s)  Provider:   You may see Dr. Junnie Olives or one of  the following Advanced Practice Providers on your designated Care Team:   Laneta Pintos,  NP Gildardo Labrador, PA-C Varney Gentleman, PA-C Cadence Furth, PA-C Ronald Cockayne, NP Morey Ar, NP    We recommend signing up for the patient portal called "MyChart".  Sign up information is provided on this After Visit Summary.  MyChart is used to connect with patients for Virtual Visits (Telemedicine).  Patients are able to view lab/test results, encounter notes, upcoming appointments, etc.  Non-urgent messages can be sent to your provider as well.   To learn more about what you can do with MyChart, go to [email or web address].         Signed, Constancia Delton, MD  08/02/2023 12:11 PM    [facility]

## 2023-08-10 ENCOUNTER — Ambulatory Visit: Attending: Cardiology

## 2023-08-10 DIAGNOSIS — I150 Renovascular hypertension: Secondary | ICD-10-CM

## 2023-08-10 LAB — ECHOCARDIOGRAM COMPLETE
AR max vel: 1.97 cm2
AV Area VTI: 2.14 cm2
AV Area mean vel: 1.94 cm2
AV Mean grad: 3 mmHg
AV Peak grad: 5.5 mmHg
Ao pk vel: 1.17 m/s
Area-P 1/2: 3.48 cm2
Calc EF: 55.6 %
S' Lateral: 3.2 cm
Single Plane A2C EF: 58.6 %
Single Plane A4C EF: 51.6 %

## 2023-08-14 ENCOUNTER — Encounter (INDEPENDENT_AMBULATORY_CARE_PROVIDER_SITE_OTHER): Payer: Self-pay

## 2023-08-15 ENCOUNTER — Ambulatory Visit: Payer: Self-pay | Admitting: Cardiology

## 2023-08-27 ENCOUNTER — Ambulatory Visit: Admitting: Physical Therapy

## 2023-08-30 ENCOUNTER — Ambulatory Visit: Admitting: Physical Therapy

## 2023-09-03 ENCOUNTER — Ambulatory Visit: Admitting: Physical Therapy

## 2023-09-05 ENCOUNTER — Ambulatory Visit: Admitting: Physical Therapy

## 2023-09-07 ENCOUNTER — Other Ambulatory Visit: Payer: Self-pay

## 2023-09-07 DIAGNOSIS — E039 Hypothyroidism, unspecified: Secondary | ICD-10-CM

## 2023-09-07 MED ORDER — LEVOTHYROXINE SODIUM 75 MCG PO TABS: ORAL_TABLET | ORAL | 1 refills | Status: DC

## 2023-09-07 NOTE — Telephone Encounter (Signed)
 E-Prescribing Status: Receipt confirmed by pharmacy (03/12/2023  3:53 PM EST)

## 2023-09-10 ENCOUNTER — Encounter: Admitting: Physical Therapy

## 2023-09-10 ENCOUNTER — Other Ambulatory Visit: Payer: Self-pay

## 2023-09-10 MED ORDER — LISINOPRIL 40 MG PO TABS: 40.0000 mg | ORAL_TABLET | Freq: Every day | ORAL | 1 refills | Status: DC

## 2023-09-12 ENCOUNTER — Encounter: Admitting: Physical Therapy

## 2023-09-18 ENCOUNTER — Encounter: Admitting: Physical Therapy

## 2023-09-19 ENCOUNTER — Ambulatory Visit: Payer: Self-pay

## 2023-09-19 ENCOUNTER — Ambulatory Visit (INDEPENDENT_AMBULATORY_CARE_PROVIDER_SITE_OTHER)

## 2023-09-19 VITALS — BP 112/70 | HR 68 | Temp 98.3°F | Ht 62.0 in | Wt 168.4 lb

## 2023-09-19 DIAGNOSIS — I150 Renovascular hypertension: Secondary | ICD-10-CM | POA: Diagnosis not present

## 2023-09-19 DIAGNOSIS — G4700 Insomnia, unspecified: Secondary | ICD-10-CM

## 2023-09-19 DIAGNOSIS — F4321 Adjustment disorder with depressed mood: Secondary | ICD-10-CM

## 2023-09-19 DIAGNOSIS — E782 Mixed hyperlipidemia: Secondary | ICD-10-CM

## 2023-09-19 DIAGNOSIS — R42 Dizziness and giddiness: Secondary | ICD-10-CM | POA: Diagnosis not present

## 2023-09-19 DIAGNOSIS — N1832 Chronic kidney disease, stage 3b: Secondary | ICD-10-CM | POA: Diagnosis not present

## 2023-09-19 DIAGNOSIS — E039 Hypothyroidism, unspecified: Secondary | ICD-10-CM

## 2023-09-19 DIAGNOSIS — E1122 Type 2 diabetes mellitus with diabetic chronic kidney disease: Secondary | ICD-10-CM

## 2023-09-19 DIAGNOSIS — K635 Polyp of colon: Secondary | ICD-10-CM

## 2023-09-19 DIAGNOSIS — I739 Peripheral vascular disease, unspecified: Secondary | ICD-10-CM

## 2023-09-19 DIAGNOSIS — Z1211 Encounter for screening for malignant neoplasm of colon: Secondary | ICD-10-CM

## 2023-09-19 DIAGNOSIS — Z7984 Long term (current) use of oral hypoglycemic drugs: Secondary | ICD-10-CM

## 2023-09-19 DIAGNOSIS — Z1231 Encounter for screening mammogram for malignant neoplasm of breast: Secondary | ICD-10-CM

## 2023-09-19 DIAGNOSIS — Z0000 Encounter for general adult medical examination without abnormal findings: Secondary | ICD-10-CM

## 2023-09-19 LAB — COMPREHENSIVE METABOLIC PANEL WITH GFR
ALT: 7 U/L (ref 0–35)
AST: 18 U/L (ref 0–37)
Albumin: 4 g/dL (ref 3.5–5.2)
Alkaline Phosphatase: 88 U/L (ref 39–117)
BUN: 24 mg/dL — ABNORMAL HIGH (ref 6–23)
CO2: 27 meq/L (ref 19–32)
Calcium: 9.5 mg/dL (ref 8.4–10.5)
Chloride: 104 meq/L (ref 96–112)
Creatinine, Ser: 1.33 mg/dL — ABNORMAL HIGH (ref 0.40–1.20)
GFR: 39.92 mL/min — ABNORMAL LOW (ref >60.00)
Glucose, Bld: 122 mg/dL — ABNORMAL HIGH (ref 70–99)
Potassium: 4.6 meq/L (ref 3.5–5.1)
Sodium: 137 meq/L (ref 135–145)
Total Bilirubin: 0.4 mg/dL (ref 0.2–1.2)
Total Protein: 7.3 g/dL (ref 6.0–8.3)

## 2023-09-19 LAB — TSH: TSH: 0.88 u[IU]/mL (ref 0.35–5.50)

## 2023-09-19 MED ORDER — FUROSEMIDE 20 MG PO TABS: 20.0000 mg | ORAL_TABLET | Freq: Every day | ORAL | 3 refills | Status: AC

## 2023-09-19 MED ORDER — CLOPIDOGREL BISULFATE 75 MG PO TABS: 75.0000 mg | ORAL_TABLET | Freq: Every day | ORAL | 3 refills | Status: AC

## 2023-09-19 MED ORDER — METOPROLOL TARTRATE 25 MG PO TABS: 25.0000 mg | ORAL_TABLET | Freq: Two times a day (BID) | ORAL | 3 refills | Status: AC

## 2023-09-19 MED ORDER — ROSUVASTATIN CALCIUM 10 MG PO TABS: 10.0000 mg | ORAL_TABLET | Freq: Every day | ORAL | 3 refills | Status: AC

## 2023-09-19 NOTE — Assessment & Plan Note (Signed)
 Check TSH today. Continue Levothyroxine  75 mcg daily.

## 2023-09-19 NOTE — Progress Notes (Signed)
 Established Patient Office Visit TOC from Dr. Maribeth    Subjective  Patient ID: Danielle Park, female    DOB: Jun 24, 1950  Age: 73 y.o. MRN: [id number]  Chief Complaint  Patient presents with   Establish Care   She  has a past medical history of Chicken pox, Congenital doubling of uterus, Congenital single kidney, Diabetes mellitus, Hypertension, Right ankle pain (04/18/2016), and UTI (urinary tract infection).  HPI 1) Positional vertigo:  Happening almost everyday. She has done home exercise for vertigo but has not noticed significant improvement in her symptoms.   2) Insomnia:  Takes prn Trazodone . Gets groggy in the morning do does not take it unless necessary.   3) Renovascular hypertension, s/p renal artery stent:  Home BP has been well controlled except for couple of low BP readings. She saw Dr. Agbor-Etang/cardiologist on 08/02/23 who recommends continuing lisinopril  40 mg daily, Lasix  20 mg daily. She is also taking Lopressor  25 mg BID. Has appointment with vascular surgery on 10/05/23.   4) Hypothyroidism:  On Levothyroxine  75 mcg daily, takes it first thing in the morning.   5) PVD, mixed hyperlipidemia, femoral and renal artery stent:  On Aspirin 81 mg, Clopidogrel  75 mg, Rosuvastatin  10 mg. Is established with vascular surgery. Lipid panel 02/05/23 LDL 52.   6) DM II, CKD:  On Trulicity  4.5 mg weekly, and comes through [facility].  Farxifa 10 mg, comes through [facility] pharmacy.  Renal function from 5/525: Cr 1.35, GFR 39.25  7) Preventative:  - Family h/o colon polyps: Last colonoscopy 02/06/2014, was recommended to repeat in 5 years. No results available, last colonoscopy was at [facility].  - Due for breast cancer screening.  - Due for shingles immunization.    ROS As per HPI    Objective:     BP 112/70 (BP Location: Right Arm, Patient Position: Sitting)   Pulse 68   Temp 98.3 F (36.8 C) (Oral)   Ht 5' 2 (1.575 m)   Wt 168 lb 6.4 oz  (76.4 kg)   SpO2 96%   BMI 30.80 kg/m      09/19/2023   11:13 AM 07/30/2023    1:06 PM 02/05/2023    1:11 PM  Depression screen PHQ 2/9  Decreased Interest 0 0 0  Down, Depressed, Hopeless 0 0 0  PHQ - 2 Score 0 0 0  Altered sleeping 0 0 0  Tired, decreased energy 0 0 0  Change in appetite 0 0 0  Feeling bad or failure about yourself  0 0 0  Trouble concentrating 0 0 0  Moving slowly or fidgety/restless 0 0 0  Suicidal thoughts 0 0 0  PHQ-9 Score 0 0 0  Difficult doing work/chores Not difficult at all  Not difficult at all      09/19/2023   11:13 AM 07/30/2023    1:06 PM 02/05/2023    1:11 PM 07/31/2022    4:31 PM  GAD 7 : Generalized Anxiety Score  Nervous, Anxious, on Edge 0 0 0 0  Control/stop worrying 0 0 0 0  Worry too much - different things 0 0 0 0  Trouble relaxing 0 0 0 0  Restless 0 0 0 0  Easily annoyed or irritable 0 0 0 0  Afraid - awful might happen 0 0 0 0  Total GAD 7 Score 0 0 0 0  Anxiety Difficulty Not difficult at all  Not difficult at all Not difficult at all  09/19/2023   11:13 AM 07/30/2023    1:06 PM 02/05/2023    1:11 PM  Depression screen PHQ 2/9  Decreased Interest 0 0 0  Down, Depressed, Hopeless 0 0 0  PHQ - 2 Score 0 0 0  Altered sleeping 0 0 0  Tired, decreased energy 0 0 0  Change in appetite 0 0 0  Feeling bad or failure about yourself  0 0 0  Trouble concentrating 0 0 0  Moving slowly or fidgety/restless 0 0 0  Suicidal thoughts 0 0 0  PHQ-9 Score 0 0 0  Difficult doing work/chores Not difficult at all  Not difficult at all      09/19/2023   11:13 AM 07/30/2023    1:06 PM 02/05/2023    1:11 PM 07/31/2022    4:31 PM  GAD 7 : Generalized Anxiety Score  Nervous, Anxious, on Edge 0 0 0 0  Control/stop worrying 0 0 0 0  Worry too much - different things 0 0 0 0  Trouble relaxing 0 0 0 0  Restless 0 0 0 0  Easily annoyed or irritable 0 0 0 0  Afraid - awful might happen 0 0 0 0  Total GAD 7 Score 0 0 0 0  Anxiety Difficulty  Not difficult at all  Not difficult at all Not difficult at all   SDOH Screenings   Food Insecurity: No Food Insecurity (09/12/2021)  Housing: Low Risk  (09/12/2021)  Transportation Needs: No Transportation Needs (09/12/2021)  Depression (PHQ2-9): Low Risk  (09/19/2023)  Financial Resource Strain: Low Risk  (09/12/2021)  Physical Activity: Sufficiently Active (09/12/2021)  Social Connections: Unknown (09/12/2021)  Stress: No Stress Concern Present (09/12/2021)  Tobacco Use: Low Risk  (09/19/2023)     Physical Exam     No results found for any visits on 09/19/23.  The 10-year ASCVD risk score (Arnett DK, et al., 2019) is: 20.2%     Assessment & Plan:   Annual physical exam Assessment & Plan: Annual wellness exam was done. Screening mammogram, colonoscopy ordered. Patient not interested in updating shingles immunization at this time.    Hypothyroidism, unspecified type Assessment & Plan: Check TSH today. Continue Levothyroxine  75 mcg daily.   Orders: -     TSH  Renovascular hypertension Assessment & Plan: Chronic, stable and BP within goal. Encourage hydration and continue f/u with nephrology as well as continuing following medications:  Lasix  20 mg daily Metoprolol  25 mg twice daily Lisinopril  40 mg daily   Orders: -     Comprehensive metabolic panel with GFR  Encounter for screening mammogram for malignant neoplasm of breast Assessment & Plan: Annual wellness exam was done. Screening mammogram, colonoscopy ordered. Patient not interested in updating shingles immunization at this time.   Orders: -     3D Screening Mammogram, Left and Right; Future  Vertigo Assessment & Plan: Positional, recommend OT for vestibular therapy. Referral made.   Orders: -     Ambulatory referral to Occupational Therapy  Stage 3b chronic kidney disease (HCC) -     AMB Referral [facility]  Type 2 diabetes mellitus with stage 3b chronic kidney disease, without long-term  current use of insulin  (HCC) Assessment & Plan: Chronic issue.  Check CMP today.  Blood glucose well controlled.  Continue Farxiga  10 mg daily and Trulicity  4.5 mg weekly. Request pharmacy consult for assistance on continuing current treatment regime.    Grief Assessment & Plan: We again discussed about potential for behavioral and  pharmacological intervention for grief, anxiety. Patient is feeling better today. She will reach out to us  if worsening mood or if she changes her mind on pharmacological intervention.    PVD (peripheral vascular disease) (HCC) Assessment & Plan: With h/o renal artery, femoral artery stent. Continue f/u with vascular surgery as scheduled.  Continue Rosuvastatin  10 mg.    Insomnia, unspecified type Assessment & Plan: Recommend trying Melatonin at sunset and prn (at bedtime) Trazodone  50 mg, 1 tab if unable to sleep. Also discussed pharmacological intervention to help with anxiety which may further improve sleep. Patient will try Melatonin first, and will reach out to our clinic if insomnia persists.    Mixed hyperlipidemia Assessment & Plan: LDL within goal. Continue Rosuvastatin  10 mg daily.    Screening for colorectal cancer Assessment & Plan: Annual wellness exam was done. Screening mammogram, colonoscopy ordered. Patient not interested in updating shingles immunization at this time.   Orders: -     Amb Referral to Colonoscopy  Other orders -     Clopidogrel  Bisulfate; Take 1 tablet (75 mg total) by mouth daily.  Dispense: 90 tablet; Refill: 3 -     Furosemide ; Take 1 tablet (20 mg total) by mouth daily.  Dispense: 90 tablet; Refill: 3 -     Metoprolol  Tartrate; Take 1 tablet (25 mg total) by mouth 2 (two) times daily.  Dispense: 180 tablet; Refill: 3 -     Rosuvastatin  Calcium ; Take 1 tablet (10 mg total) by mouth daily.  Dispense: 90 tablet; Refill: 3    Return in about 6 months (around 03/20/2024) for Chronic .   Luke Shade, MD

## 2023-09-19 NOTE — Assessment & Plan Note (Signed)
 With h/o renal artery, femoral artery stent. Continue f/u with vascular surgery as scheduled.  Continue Rosuvastatin  10 mg.

## 2023-09-19 NOTE — Assessment & Plan Note (Addendum)
 Chronic issue.  Check CMP today.  Blood glucose well controlled.  Continue Farxiga  10 mg daily and Trulicity  4.5 mg weekly. Request pharmacy consult for assistance on continuing current treatment regime.

## 2023-09-19 NOTE — Assessment & Plan Note (Signed)
 Recommend trying Melatonin at sunset and prn (at bedtime) Trazodone  50 mg, 1 tab if unable to sleep. Also discussed pharmacological intervention to help with anxiety which may further improve sleep. Patient will try Melatonin first, and will reach out to our clinic if insomnia persists.

## 2023-09-19 NOTE — Assessment & Plan Note (Signed)
 LDL within goal. Continue Rosuvastatin  10 mg daily.

## 2023-09-19 NOTE — Assessment & Plan Note (Addendum)
 Annual wellness exam was done. Screening mammogram, colonoscopy ordered. Patient not interested in updating shingles immunization at this time.

## 2023-09-19 NOTE — Assessment & Plan Note (Signed)
 Chronic, stable and BP within goal. Encourage hydration and continue f/u with nephrology as well as continuing following medications:  Lasix  20 mg daily Metoprolol  25 mg twice daily Lisinopril  40 mg daily

## 2023-09-19 NOTE — Assessment & Plan Note (Signed)
 We again discussed about potential for behavioral and pharmacological intervention for grief, anxiety. Patient is feeling better today. She will reach out to us  if worsening mood or if she changes her mind on pharmacological intervention.

## 2023-09-19 NOTE — Assessment & Plan Note (Signed)
 Positional, recommend OT for vestibular therapy. Referral made.

## 2023-09-19 NOTE — Patient Instructions (Addendum)
 YOUR MAMMOGRAM IS DUE, PLEASE CALL AND GET THIS SCHEDULED! [facility] - call [phone]   You can try taking Melatonin in the evening time to help with sleep.    Therapy referral for vertigo was done today.

## 2023-09-20 ENCOUNTER — Encounter: Admitting: Physical Therapy

## 2023-09-24 ENCOUNTER — Other Ambulatory Visit: Payer: Self-pay

## 2023-09-24 ENCOUNTER — Encounter: Payer: Self-pay | Admitting: Pain Medicine

## 2023-09-24 ENCOUNTER — Telehealth: Payer: Self-pay

## 2023-09-24 ENCOUNTER — Ambulatory Visit: Attending: Pain Medicine | Admitting: Pain Medicine

## 2023-09-24 VITALS — BP 191/66 | HR 78 | Temp 96.3°F | Ht 62.0 in | Wt 168.0 lb

## 2023-09-24 DIAGNOSIS — Z1211 Encounter for screening for malignant neoplasm of colon: Secondary | ICD-10-CM

## 2023-09-24 DIAGNOSIS — M62830 Muscle spasm of back: Secondary | ICD-10-CM | POA: Diagnosis present

## 2023-09-24 DIAGNOSIS — Z79899 Other long term (current) drug therapy: Secondary | ICD-10-CM | POA: Diagnosis present

## 2023-09-24 DIAGNOSIS — E559 Vitamin D deficiency, unspecified: Secondary | ICD-10-CM | POA: Insufficient documentation

## 2023-09-24 DIAGNOSIS — Z789 Other specified health status: Secondary | ICD-10-CM | POA: Insufficient documentation

## 2023-09-24 DIAGNOSIS — M7918 Myalgia, other site: Secondary | ICD-10-CM | POA: Insufficient documentation

## 2023-09-24 DIAGNOSIS — M546 Pain in thoracic spine: Secondary | ICD-10-CM | POA: Insufficient documentation

## 2023-09-24 DIAGNOSIS — M899 Disorder of bone, unspecified: Secondary | ICD-10-CM | POA: Diagnosis not present

## 2023-09-24 DIAGNOSIS — G894 Chronic pain syndrome: Secondary | ICD-10-CM | POA: Diagnosis not present

## 2023-09-24 DIAGNOSIS — M4810 Ankylosing hyperostosis [Forestier], site unspecified: Secondary | ICD-10-CM | POA: Insufficient documentation

## 2023-09-24 DIAGNOSIS — G8929 Other chronic pain: Secondary | ICD-10-CM | POA: Insufficient documentation

## 2023-09-24 MED ORDER — NA SULFATE-K SULFATE-MG SULF 17.5-3.13-1.6 GM/177ML PO SOLN: 1.0000 | Freq: Once | ORAL | 0 refills | Status: AC

## 2023-09-24 NOTE — Patient Instructions (Signed)

## 2023-09-24 NOTE — Progress Notes (Signed)
 PROVIDER NOTE: Interpretation of information contained herein should be left to medically-trained personnel. Specific patient instructions are provided elsewhere under Patient Instructions section of medical record. This document was created in part using AI and STT-dictation technology, any transcriptional errors that may result from this process are unintentional.  Patient: Danielle Park  Service: E/M Encounter  Provider: Eric DELENA Como, MD  DOB: 28-Apr-1950  Delivery: Face-to-face  Specialty: Interventional Pain Management  MRN: [id number]  Setting: Ambulatory outpatient facility  Specialty designation: 09  Type: New Patient  Location: Outpatient office facility  PCP: Abbey Bruckner, MD  DOS: 09/24/2023    Referring Prov.: Bair, Bruckner, MD   Primary Reason(s) for Visit: Encounter for initial evaluation of one or more chronic problems (new to examiner) potentially causing chronic pain, and posing a threat to normal musculoskeletal function. (Level of risk: High) CC: Back Pain (upper)  HPI  Danielle Park is a 73 y.o. year old, female patient, who comes for the first time to our practice referred by Bair, Kalpana, MD for our initial evaluation of her chronic pain. She has Renal artery stenosis (HCC); Hypothyroidism; Solitary kidney; PVD (peripheral vascular disease) (HCC); Renovascular hypertension; Chronic thoracic back pain (1ry area of Pain) (Intermittent) (Bilateral); Screening for colorectal cancer; Fatty liver; Low back pain; Hyperlipidemia; Stage 3b chronic kidney disease (HCC); Vitamin D  deficiency; Heartburn; Proteinuria; Insomnia; Vertigo; Grief; DM type 2 causing CKD stage 3 (HCC); Chronic pain syndrome; Pharmacologic therapy; Disorder of skeletal system; Problems influencing health status; Myofascial pain syndrome of thoracic spine; Spasm of thoracic back muscle; and DISH (diffuse idiopathic skeletal hyperostosis) on their problem list. Today she comes in for evaluation of her Back Pain  (upper)  Pain Assessment: Location: Left, Right Back Radiating: Denies Onset: More than a month ago Duration: Chronic pain Quality: Aching, Burning, Constant, Throbbing, Stabbing, Shooting, Sharp, Discomfort Severity: 10-Worst pain ever (only when she moving her arm)/10 (subjective, self-reported pain score)  Effect on ADL: limits my daily activities Timing: Constant Modifying factors: meds, not moving, heating pad BP: (!) 191/66  HR: 78  Onset and Duration: Sudden and Present longer than 3 months Cause of pain: Unknown Severity: Getting worse, NAS-11 at its worse: 10/10, NAS-11 at its best: 0/10, and NAS-11 now: 0/10 Timing: During activity or exercise Aggravating Factors: Motion Alleviating Factors: Hot packs, Lying down, Medications, and Resting  Quality of Pain: Disabling, Horrible, Sharp, and Stabbing Previous Examinations or Tests: The patient denies exams Previous Treatments: The patient denies treatment  Danielle Park is being evaluated for possible interventional pain management therapies for the treatment of her chronic pain.  Discussed the use of AI scribe software for clinical note transcription with the patient, who gave verbal consent to proceed.  History of Present Illness   Danielle Park is a 73 year old female who presents with chronic low back pain.  She experiences chronic low back pain located at her bra line, which can occur on either side and is associated with arm movements. The pain is described as a stabbing sensation, similar to a knife being stuck in her back, and is associated with muscle spasms. The pain is intermittent and has been present for years, worsening over time. It is easily aggravated by activities such as standing exercises or touching her toes, leading to muscle spasms that can last for days. Currently, she is not experiencing pain as long as she avoids activities that trigger it.  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 She describes the pain as feeling like 'somebody stuck a knife in my back' when she bent down to pull something. The pain at that time did not last long.  She recalls a recent incident on April 2nd when she fell while moving a table upstairs, resulting in a concussion. She was evaluated in the emergency room where x-rays of her back and hip were performed, showing no fractures.  She recalls being told by a previous doctor that her pain could be related to her trapezius muscles and that she may have developed scar tissue in those muscles.  No current pain as long as she refrains from activities that trigger it.      Danielle Park has been informed that this initial visit was an evaluation only.  On the follow up appointment I will go over the results, including ordered tests and available interventional therapies. At that time she will have the opportunity to decide whether to proceed with offered therapies or not. In the event that Danielle Park prefers avoiding interventional options, this will conclude our involvement in the case.  Medication management recommendations may be provided upon request.  Patient informed that diagnostic tests may be ordered to assist in identifying underlying causes, narrow the list of differential diagnoses and aid in determining candidacy for (or contraindications to) planned therapeutic interventions.  Historic Controlled Substance Pharmacotherapy Review PMP and historical list of controlled substances: None Most recently prescribed controlled substance(s): Opioid Analgesic: None MME/day: 0 mg/day  Historical Monitoring: The patient  reports no history of drug use. List of prior UDS Testing: No results found for: MDMA, COCAINSCRNUR, PCPSCRNUR, PCPQUANT, CANNABQUANT, THCU, ETH, CBDTHCR, D8THCCBX, D9THCCBX Historical Background Evaluation: [place] PMP: PDMP reviewed during this  encounter. Review of the past 61-months conducted.             PMP NARX Score Report:  Narcotic: 000 Sedative: 000 Stimulant: 000 [place] Department of public safety, offender search: Engineer, mining Information) Non-contributory Risk Assessment Profile: Aberrant behavior: None observed or detected today Risk factors for fatal opioid overdose: None identified today PMP NARX Overdose Risk Score: 000 Fatal overdose hazard ratio (HR): Calculation deferred Non-fatal overdose hazard ratio (HR): Calculation deferred Risk of opioid abuse or dependence: 0.7-3.0% with doses <= 36 MME/day and 6.1-26% with doses >= 120 MME/day. Substance use disorder (SUD) risk level: See below Personal History of Substance Abuse (SUD-Substance use disorder):  Alcohol:    Illegal Drugs:    Rx Drugs:    ORT Risk Level calculation:    ORT Scoring interpretation table:  Score <3 = Low Risk for SUD  Score between 4-7 = Moderate Risk for SUD  Score >8 = High Risk for Opioid Abuse   PHQ-2 Depression Scale:  Total score: 0  PHQ-2 Scoring interpretation table: (Score and probability of major depressive disorder)  Score 0 = No depression  Score 1 = 15.4% Probability  Score 2 = 21.1% Probability  Score 3 = 38.4% Probability  Score 4 = 45.5% Probability  Score 5 = 56.4% Probability  Score 6 = 78.6% Probability   PHQ-9 Depression Scale:  Total score: 0  PHQ-9 Scoring interpretation table:  Score 0-4 = No depression  Score 5-9 = Mild depression  Score 10-14 = Moderate depression  Score 15-19 = Moderately severe depression  Score 20-27 = Severe depression (2.4 times higher risk of SUD and 2.89 times higher risk of overuse)   Pharmacologic Plan: As per protocol, I have  not taken over any controlled substance management, pending the results of ordered tests and/or consults.            Initial impression: Pending review of available data and ordered tests.  Meds   Current Outpatient Medications:    aspirin 81 MG tablet,  Take 81 mg by mouth daily., Disp: , Rfl:    cholecalciferol (VITAMIN D3) 25 MCG (1000 UT) tablet, Take 2,000 Units by mouth daily., Disp: , Rfl:    clopidogrel  (PLAVIX ) 75 MG tablet, Take 1 tablet (75 mg total) by mouth daily., Disp: 90 tablet, Rfl: 3   dapagliflozin  propanediol (FARXIGA ) 10 MG TABS tablet, Take 1 tablet (10 mg total) by mouth daily before breakfast., Disp: 90 tablet, Rfl: 1   Dulaglutide  (TRULICITY ) 4.5 MG/0.5ML SOPN, INJECT 4.5MG  (0.5ML) UNDER THE SKIN ONCE A WEEK, Disp: 8 mL, Rfl: 3   furosemide  (LASIX ) 20 MG tablet, Take 1 tablet (20 mg total) by mouth daily., Disp: 90 tablet, Rfl: 3   levothyroxine  (SYNTHROID ) 75 MCG tablet, TAKE ONE TABLET BY MOUTH ONCE DAILY BEFORE BREAKFAST, Disp: 90 tablet, Rfl: 1   lisinopril  (ZESTRIL ) 40 MG tablet, Take 1 tablet (40 mg total) by mouth daily., Disp: 90 tablet, Rfl: 1   metoprolol  tartrate (LOPRESSOR ) 25 MG tablet, Take 1 tablet (25 mg total) by mouth 2 (two) times daily., Disp: 180 tablet, Rfl: 3   rosuvastatin  (CRESTOR ) 10 MG tablet, Take 1 tablet (10 mg total) by mouth daily., Disp: 90 tablet, Rfl: 3   traZODone  (DESYREL ) 50 MG tablet, Take 25-50 mg by mouth at bedtime as needed., Disp: , Rfl:   Imaging Review  Cervical Imaging: Cervical CT wo contrast: Results for orders placed during the hospital encounter of 06/27/23 CT CERVICAL SPINE WO CONTRAST  Narrative CLINICAL DATA:  Clemens downstairs, hit head, neck trauma  EXAM: CT CERVICAL SPINE WITHOUT CONTRAST  TECHNIQUE: Multidetector CT imaging of the cervical spine was performed without intravenous contrast. Multiplanar CT image reconstructions were also generated.  RADIATION DOSE REDUCTION: This exam was performed according to the departmental dose-optimization program which includes automated exposure control, adjustment of the mA and/or kV according to patient size and/or use of iterative reconstruction technique.  COMPARISON:  None Available.  FINDINGS: Alignment:  Alignment is anatomic.  Skull base and vertebrae: No acute fracture. No primary bone lesion or focal pathologic process.  Soft tissues and spinal canal: No prevertebral fluid or swelling. No visible canal hematoma.  Disc levels: There is mild diffuse cervical spondylosis greatest at C4-5, C5-6, and C6-7. No bony encroachment upon the central canal or neural foramina.  Upper chest: Airway is patent. There are multiple small biapical pulmonary nodules, largest measuring 3 mm in the left apex image 73/2.  Other: Reconstructed images demonstrate no additional findings.  IMPRESSION: 1. No acute cervical spine fracture. 2. Multiple small biapical pulmonary nodules, largest measuring 3 mm. Further evaluation with nonemergent chest CT may be useful to assess the remainder of the lung parenchyma.   Electronically Signed By: Ozell Daring M.D. On: 06/27/2023 16:16  Thoracic Imaging: Thoracic DG 4 views: Results for orders placed in visit on 07/17/16 [facility] Thoracic Spine 4V  Narrative CLINICAL DATA:  Thoracic back pain, medial to the left scapula.  EXAM: THORACIC SPINE - 4+ VIEW  COMPARISON:  None.  FINDINGS: Evaluation the superior aspect of the thoracic spine, including the cervicothoracic junction is degraded secondary to overlying osseous soft tissue structures, even on the provided swimmer's radiograph.  Normal alignment of the thoracic  spine. No definite anterolisthesis or retrolisthesis. Thoracic vertebral body heights appear preserved.  Stigmata of DISH throughout the mid in caudal aspects of the thoracic spine. Thoracic intervertebral disc space heights appear preserved.  Limited visualization of the adjacent thorax is normal. Atherosclerotic plaque within the thoracic aorta.  IMPRESSION: 1. No definite acute findings. 2. Stigmata of DISH within the thoracic spine. 3.  Aortic Atherosclerosis (ICD10-I70.0).   Electronically Signed By: Norleen Roulette M.D. On:  07/17/2016 18:39  Lumbosacral Imaging: Lumbar DG (Complete) 4+V: Results for orders placed during the hospital encounter of 08/21/17 DG Lumbar Spine Complete  Narrative CLINICAL DATA:  Low back pain  EXAM: LUMBAR SPINE - COMPLETE 4+ VIEW  COMPARISON:  12/16/2014  FINDINGS: Negative for fracture. Normal alignment. Disc spaces well maintained. No pars defect  Atherosclerotic disease. Right iliac artery stent. Right renal artery stent.  IMPRESSION: No acute abnormality.  Atherosclerotic disease.   Electronically Signed By: Carlin Gaskins M.D. On: 08/21/2017 18:35  Hip Imaging: Hip-R DG 2-3 views: Results for orders placed during the hospital encounter of 12/16/14 DG HIP UNILAT WITH PELVIS 2-3 VIEWS RIGHT  Narrative CLINICAL DATA:  Chronic right hip pain.  No known injury.  EXAM: DG HIP (WITH OR WITHOUT PELVIS) 2-3V RIGHT  COMPARISON:  None.  FINDINGS: There is no evidence of hip fracture or dislocation. There is no evidence of arthropathy or other focal bone abnormality.  IMPRESSION: Negative.   Electronically Signed By: Lynwood Hugger M.D. On: 12/16/2014 15:38  Hip-L DG 2-3 views: Results for orders placed during the hospital encounter of 06/27/23 DG Hip Unilat W or Wo Pelvis 2-3 Views Left  Narrative CLINICAL DATA:  Clemens backwards, left hip pain  EXAM: DG HIP (WITH OR WITHOUT PELVIS) 2-3V LEFT  COMPARISON:  None Available.  FINDINGS: Frontal view of the pelvis as well as frontal and frogleg lateral views of the left hip are obtained. No acute fracture, subluxation, or dislocation. Joint spaces are relatively well preserved. Sacroiliac joints are unremarkable. Diffuse atherosclerosis. Stent in the right iliac distribution.  IMPRESSION: 1. No acute displaced fracture.   Electronically Signed By: Ozell Daring M.D. On: 06/27/2023 16:17  Foot Imaging: Foot-R DG Complete: Results for orders placed in visit on 06/08/14 DG Foot Complete  Right  Narrative 3 views of the right foot demonstrates osseous R foot rectus in nature with large plantar distal aorta and a calcaneal heel spur and a retrocalcaneal proximally oriented calcaneal spur. Soft tissue increasing density of the plantar fascial calcaneal insertion site is indicative of plantar fasciitis. I see no fractures.  Foot-L DG Complete: Results for orders placed in visit on 06/08/14 DG Foot Complete Left  Narrative 3 views of the left foot demonstrates an osseously mature individual retrocalcaneal heel spurs and plantar distally or any calcaneal spurs are noted. Along with exostosis hallux left also noted. Tissue increasing density at the plantar fascial calcaneal insertion site of the left heel at the spur site. Otherwise no osseous abnormalities.  Complexity Note: Imaging results reviewed.                         ROS  Cardiovascular: Daily Aspirin intake and High blood pressure Pulmonary or Respiratory: No reported pulmonary signs or symptoms such as wheezing and difficulty taking a deep full breath (Asthma), difficulty blowing air out (Emphysema), coughing up mucus (Bronchitis), persistent dry cough, or temporary stoppage of breathing during sleep Neurological: No reported neurological signs or symptoms such as seizures,  abnormal skin sensations, urinary and/or fecal incontinence, being born with an abnormal open spine and/or a tethered spinal cord Psychological-Psychiatric: Difficulty sleeping and or falling asleep Gastrointestinal: No reported gastrointestinal signs or symptoms such as vomiting or evacuating blood, reflux, heartburn, alternating episodes of diarrhea and constipation, inflamed or scarred liver, or pancreas or irrregular and/or infrequent bowel movements Genitourinary: Kidney disease Hematological: No reported hematological signs or symptoms such as prolonged bleeding, low or poor functioning platelets, bruising or bleeding easily, hereditary bleeding  problems, low energy levels due to low hemoglobin or being anemic Endocrine: High blood sugar controlled without the use of insulin  (NIDDM) and No reported endocrine signs or symptoms such as high or low blood sugar, rapid heart rate due to high thyroid  levels, obesity or weight gain due to slow thyroid  or thyroid  disease Rheumatologic: No reported rheumatological signs and symptoms such as fatigue, joint pain, tenderness, swelling, redness, heat, stiffness, decreased range of motion, with or without associated rash Musculoskeletal: Negative for myasthenia gravis, muscular dystrophy, multiple sclerosis or malignant hyperthermia Work History: Retired  Allergies  Danielle Park has no known allergies.  Laboratory Chemistry Profile   Renal Lab Results  Component Value Date   BUN 24 (H) 09/19/2023   CREATININE 1.33 (H) 09/19/2023   BCR 17 04/05/2018   GFR 39.92 (L) 09/19/2023   GFRAA >60 03/06/2015   GFRNONAA >60 03/06/2015   SPECGRAV <=1.005 (A) 11/06/2022   PHUR 6.5 11/06/2022   PROTEINUR negative 11/06/2022     Electrolytes Lab Results  Component Value Date   NA 137 09/19/2023   K 4.6 09/19/2023   CL 104 09/19/2023   CALCIUM  9.5 09/19/2023   PHOS 3.5 03/29/2020     Hepatic Lab Results  Component Value Date   AST 18 09/19/2023   ALT 7 09/19/2023   ALBUMIN 4.0 09/19/2023   ALKPHOS 88 09/19/2023   LIPASE 42 03/06/2015     ID No results found for: LYMEIGGIGMAB, HIV, SARSCOV2NAA, STAPHAUREUS, MRSAPCR, HCVAB, PREGTESTUR, RMSFIGG, QFVRPH1IGG, QFVRPH2IGG   Bone Lab Results  Component Value Date   VD25OH 37.40 12/09/2018     Endocrine Lab Results  Component Value Date   GLUCOSE 122 (H) 09/19/2023   GLUCOSEU 50 (A) 03/06/2015   HGBA1C 5.6 07/30/2023   TSH 0.88 09/19/2023   FREET4 1.33 11/18/2013     Neuropathy Lab Results  Component Value Date   HGBA1C 5.6 07/30/2023     CNS No results found for: COLORCSF, APPEARCSF, RBCCOUNTCSF, WBCCSF,  POLYSCSF, LYMPHSCSF, EOSCSF, PROTEINCSF, GLUCCSF, JCVIRUS, CSFOLI, IGGCSF, LABACHR, ACETBL   Inflammation (CRP: Acute  ESR: Chronic) No results found for: CRP, ESRSEDRATE, LATICACIDVEN   Rheumatology No results found for: RF, ANA, LABURIC, URICUR, LYMEIGGIGMAB, LYMEABIGMQN, HLAB27   Coagulation Lab Results  Component Value Date   PLT 231.0 07/17/2016     Cardiovascular Lab Results  Component Value Date   CKTOTAL 80 05/17/2012   CKMB 1.4 05/17/2012   TROPONINI < 0.02 07/24/2012   HGB 13.2 07/17/2016   HCT 40.4 07/17/2016     Screening No results found for: SARSCOV2NAA, COVIDSOURCE, STAPHAUREUS, MRSAPCR, HCVAB, HIV, PREGTESTUR   Cancer No results found for: CEA, CA125, LABCA2   Allergens No results found for: ALMOND, APPLE, ASPARAGUS, AVOCADO, BANANA, BARLEY, BASIL, BAYLEAF, GREENBEAN, LIMABEAN, WHITEBEAN, BEEFIGE, REDBEET, BLUEBERRY, BROCCOLI, CABBAGE, MELON, CARROT, CASEIN, CASHEWNUT, CAULIFLOWER, CELERY     Note: Lab results reviewed.  PFSH  Drug: Danielle Park  reports no history of drug use. Alcohol:  reports no history of alcohol use. Tobacco:  reports that she has never smoked. She has never used smokeless tobacco. Medical:  has a past medical history of Chicken pox, Congenital doubling of uterus, Congenital single kidney, Diabetes mellitus, Hypertension, Right ankle pain (04/18/2016), and UTI (urinary tract infection). Family: family history includes Cancer in her father and sister; Cancer - Lung in her brother; Diabetes in her mother, sister, and another family member; Heart disease in her mother, sister, sister, and another family member; Hypertension in her father and mother.  Past Surgical History:  Procedure Laterality Date   CESAREAN SECTION     PERIPHERAL VASCULAR CATHETERIZATION N/A 01/11/2015   Procedure: Abdominal Aortogram w/Lower Extremity;  Surgeon: Selinda GORMAN Gu, MD;  Location: [facility] INVASIVE CV LAB;  Service: Cardiovascular;  Laterality: N/A;   PERIPHERAL VASCULAR CATHETERIZATION  01/11/2015   Procedure: Lower Extremity Intervention;  Surgeon: Selinda GORMAN Gu, MD;  Location: [facility] INVASIVE CV LAB;  Service: Cardiovascular;;   RENAL ARTERY STENT  2014   Dr. Gu at [facility]   VAGINAL DELIVERY     Active Ambulatory Problems    Diagnosis Date Noted   Renal artery stenosis (HCC) 05/22/2012   Hypothyroidism 07/26/2012   Solitary kidney 10/17/2012   PVD (peripheral vascular disease) (HCC) 12/31/2015   Renovascular hypertension 09/13/2011   Chronic thoracic back pain (1ry area of Pain) (Intermittent) (Bilateral) 04/18/2016   Screening for colorectal cancer 05/03/2016   Fatty liver 07/17/2016   Low back pain 08/27/2017   Hyperlipidemia 12/17/2017   Stage 3b chronic kidney disease (HCC) 12/17/2017   Vitamin D  deficiency 09/27/2018   Heartburn 12/16/2018   Proteinuria 04/17/2019   Insomnia 08/15/2019   Vertigo 03/11/2021   Grief 10/04/2018   DM type 2 causing CKD stage 3 (HCC) 09/13/2011   Chronic pain syndrome 09/24/2023   Pharmacologic therapy 09/24/2023   Disorder of skeletal system 09/24/2023   Problems influencing health status 09/24/2023   Myofascial pain syndrome of thoracic spine 09/24/2023   Spasm of thoracic back muscle 09/24/2023   DISH (diffuse idiopathic skeletal hyperostosis) 09/24/2023   Resolved Ambulatory Problems    Diagnosis Date Noted   Screening for colon cancer 09/13/2011   Congenital single kidney 09/13/2011   Screening for breast cancer 09/13/2011   Screening for cervical cancer 09/13/2011   Right thigh pain 05/15/2012   Elevated blood pressure 05/21/2012   Proximal leg weakness 09/11/2012   Dysuria 09/11/2012   Obesity (BMI 30-39.9) 05/21/2014   Pain in joint, pelvic region and thigh 05/21/2014   Ingrown right greater toenail 05/30/2014   Right hip pain 08/19/2014   Acute renal injury (HCC)  01/12/2015   Aorto-iliac atherosclerosis (HCC) 01/12/2015   Hematuria 10/17/2012   Atherosclerosis of native arteries of the extremities with ulceration (HCC) 12/08/2015   Pain in limb 12/08/2015   DVT femoral (deep venous thrombosis) with thrombophlebitis, left (HCC) 12/31/2015   Right ankle pain 04/18/2016   Elevated alkaline phosphatase level 08/09/2016   URI (upper respiratory infection) 04/05/2018   Anxiety and depression 12/16/2019   Lightheadedness 01/31/2021   Eustachian tube dysfunction 03/11/2021   Polyp of colon 09/19/2023   Past Medical History:  Diagnosis Date   Chicken pox    Congenital doubling of uterus    Diabetes mellitus    Hypertension    UTI (urinary tract infection)    Constitutional Exam  General appearance: Well nourished, well developed, and well hydrated. In no apparent acute distress Vitals:   09/24/23 1035  BP: (!) 191/66  Pulse: 78  Temp: ROLLEN)  96.3 F (35.7 C)  SpO2: 99%  Weight: 168 lb (76.2 kg)  Height: 5' 2 (1.575 m)   BMI Assessment: Estimated body mass index is 30.73 kg/m as calculated from the following:   Height as of this encounter: 5' 2 (1.575 m).   Weight as of this encounter: 168 lb (76.2 kg).  BMI interpretation table: BMI level Category Range association with higher incidence of chronic pain  <18 kg/m2 Underweight   18.5-24.9 kg/m2 Ideal body weight   25-29.9 kg/m2 Overweight Increased incidence by 20%  30-34.9 kg/m2 Obese (Class I) Increased incidence by 68%  35-39.9 kg/m2 Severe obesity (Class II) Increased incidence by 136%  >40 kg/m2 Extreme obesity (Class III) Increased incidence by 254%   Patient's current BMI Ideal Body weight  Body mass index is 30.73 kg/m. Ideal body weight: 50.1 kg (110 lb 7.2 oz) Adjusted ideal body weight: 60.5 kg (133 lb 7.5 oz)   BMI Readings from Last 4 Encounters:  09/24/23 30.73 kg/m  09/19/23 30.80 kg/m  08/02/23 29.48 kg/m  07/30/23 29.23 kg/m   Wt Readings from Last 4  Encounters:  09/24/23 168 lb (76.2 kg)  09/19/23 168 lb 6.4 oz (76.4 kg)  08/02/23 166 lb 6.4 oz (75.5 kg)  07/30/23 165 lb (74.8 kg)    Psych/Mental status: Alert, oriented x 3 (person, place, & time)       Eyes: PERLA Respiratory: No evidence of acute respiratory distress  Assessment  Primary Diagnosis & Pertinent Problem List: The primary encounter diagnosis was Chronic pain syndrome. Diagnoses of Pharmacologic therapy, Disorder of skeletal system, Problems influencing health status, Vitamin D  deficiency, Chronic thoracic back pain (1ry area of Pain) (Intermittent) (Bilateral), Myofascial pain syndrome of thoracic spine, Spasm of thoracic back muscle, and DISH (diffuse idiopathic skeletal hyperostosis) were also pertinent to this visit.  Visit Diagnosis (New problems to examiner): 1. Chronic pain syndrome   2. Pharmacologic therapy   3. Disorder of skeletal system   4. Problems influencing health status   5. Vitamin D  deficiency   6. Chronic thoracic back pain (1ry area of Pain) (Intermittent) (Bilateral)   7. Myofascial pain syndrome of thoracic spine   8. Spasm of thoracic back muscle   9. DISH (diffuse idiopathic skeletal hyperostosis)    Plan of Care (Initial workup plan)  Note: Danielle Park was reminded that as per protocol, today's visit has been an evaluation only. We have not taken over the patient's controlled substance management.  Problem-specific plan: Assessment and Plan    Low back pain   Chronic low back pain at the bra line is exacerbated by arm movements and certain activities. The pain is spasmodic and knife-like, lasting for days, and is likely muscular in origin, possibly related to trigger points in the trapezius muscles. The differential includes muscular issues and joint problems in the spine. She experienced a recent fall on June 27, 2023, with no fractures. Recent imaging may negate the need for new x-rays. Order x-rays of the upper spine if recent imaging is  insufficient. Order lab tests for electrolyte levels, including potassium, calcium , and sodium. Refer to physical therapy for evaluation and development of a stretching exercise plan.  Concussion   She sustained a concussion following a fall on June 27, 2023. A CT scan of the head showed no significant findings.       Lab Orders         Comp. Metabolic Panel (12)         Magnesium  Vitamin B12         Sedimentation rate         25-Hydroxy vitamin D  Lcms D2+D3         C-reactive protein     Imaging Orders         DG Thoracic Spine W/Swimmers     Referral Orders         Ambulatory referral to Physical Therapy     Procedure Orders    No procedure(s) ordered today   Pharmacotherapy (current): Medications ordered:  No orders of the defined types were placed in this encounter.  Medications administered during this visit: Madalen J. Stoltzfus had no medications administered during this visit.   Analgesic Pharmacotherapy:  Opioid Analgesics: For patients currently taking or requesting to take opioid analgesics, in accordance with [name]  Medical Board Guidelines, we will assess their risks and indications for the use of these substances. After completing our evaluation, we may offer recommendations, but we no longer take patients for medication management. The prescribing physician will ultimately decide, based on his/her training and level of comfort whether to adopt any of the recommendations, including whether or not to prescribe such medicines.  Membrane stabilizer: To be determined at a later time  Muscle relaxant: To be determined at a later time  NSAID: To be determined at a later time  Other analgesic(s): To be determined at a later time   Interventional management options: Danielle Park was informed that there is no guarantee that she would be a candidate for interventional therapies. The decision will be based on the results of diagnostic studies, as well as Danielle Park's  risk profile.  Procedure(s) under consideration:  Pending results of ordered studies     Interventional Therapies  Risk Factors  Considerations  Medical Comorbidities:     Planned  Pending:      Under consideration:   Pending   Completed: (Analgesic benefit)1  None at this time   Therapeutic  Palliative (PRN) options:   None established   Completed by other providers:   None reported  1(Analgesic benefit): Expressed in percentage (%). (Local anesthetic[LA] +/- sedation  L.A.Local Anesthetic  Steroid benefit  Ongoing benefit)   Provider-requested follow-up: Return in about 2 weeks (around 10/08/2023) for ( ), Eval-day (M,W), (F2F), 2nd Visit, for review of ordered tests.  Future Appointments  Date Time Provider Department Center  10/05/2023  7:30 AM [facility] None  10/05/2023  8:30 AM [facility] None  10/05/2023  8:45 AM Dew, Selinda RAMAN, MD [facility] None  10/15/2023 11:00 AM Tanya Glisson, MD [facility] None  11/02/2023 11:20 AM Vivienne Lonni Ingle, NP [facility] None  03/24/2024  2:00 PM Bair, Luke, MD [facility]   I discussed the assessment and treatment plan with the patient. The patient was provided an opportunity to ask questions and all were answered. The patient agreed with the plan and demonstrated an understanding of the instructions.  Patient advised to call back or seek an in-person evaluation if the symptoms or condition worsens.  Duration of encounter: 45 minutes.  Total time on encounter, as per AMA guidelines included both the face-to-face and non-face-to-face time personally spent by the physician and/or other qualified health care professional(s) on the day of the encounter (includes time in activities that require the physician or other qualified health care professional and does not include time in activities normally performed by clinical staff). Physician's time may include the following activities when  performed: Preparing to  see the patient (e.g., pre-charting review of records, searching for previously ordered imaging, lab work, and nerve conduction tests) Review of prior analgesic pharmacotherapies. Reviewing PMP Interpreting ordered tests (e.g., lab work, imaging, nerve conduction tests) Performing post-procedure evaluations, including interpretation of diagnostic procedures Obtaining and/or reviewing separately obtained history Performing a medically appropriate examination and/or evaluation Counseling and educating the patient/family/caregiver Ordering medications, tests, or procedures Referring and communicating with other health care professionals (when not separately reported) Documenting clinical information in the electronic or other health record Independently interpreting results (not separately reported) and communicating results to the patient/ family/caregiver Care coordination (not separately reported)  Note by: Eric DELENA Como, MD (TTS and AI technology used. I apologize for any typographical errors that were not detected and corrected.) Date: 09/24/2023; Time: 12:54 PM

## 2023-09-24 NOTE — Telephone Encounter (Signed)
 Gastroenterology Pre-Procedure Review  Request Date: 12/05/23 Requesting Physician: Dr. Jinny  PATIENT REVIEW QUESTIONS: The patient responded to the following health history questions as indicated:    1. Are you having any GI issues? no 2. Do you have a personal history of Polyps? Pt stated yes but last colonoscopy was 11/13/2015no polyps were noted but recommended repeat in 5 years 3. Do you have a family history of Colon Cancer or Polyps? no 4. Diabetes Mellitus? yes (Farxiga  (3), Trulicity  (7) stop dates advised.) 5. Joint replacements in the past 12 months?no 6. Major health problems in the past 3 months?no 7. Any artificial heart valves, MVP, or defibrillator?no    MEDICATIONS & ALLERGIES:    Patient reports the following regarding taking any anticoagulation/antiplatelet therapy:   Plavix , Coumadin, Eliquis, Xarelto, Lovenox, Pradaxa, Brilinta, or Effient? Plavix  clearance sent to PCP office Aspirin? yes (81 mg daily)  Patient confirms/reports the following medications:  Current Outpatient Medications  Medication Sig Dispense Refill   aspirin 81 MG tablet Take 81 mg by mouth daily.     cholecalciferol (VITAMIN D3) 25 MCG (1000 UT) tablet Take 2,000 Units by mouth daily.     clopidogrel  (PLAVIX ) 75 MG tablet Take 1 tablet (75 mg total) by mouth daily. 90 tablet 3   dapagliflozin  propanediol (FARXIGA ) 10 MG TABS tablet Take 1 tablet (10 mg total) by mouth daily before breakfast. 90 tablet 1   Dulaglutide  (TRULICITY ) 4.5 MG/0.5ML SOPN INJECT 4.5MG  (0.5ML) UNDER THE SKIN ONCE A WEEK 8 mL 3   furosemide  (LASIX ) 20 MG tablet Take 1 tablet (20 mg total) by mouth daily. 90 tablet 3   levothyroxine  (SYNTHROID ) 75 MCG tablet TAKE ONE TABLET BY MOUTH ONCE DAILY BEFORE BREAKFAST 90 tablet 1   lisinopril  (ZESTRIL ) 40 MG tablet Take 1 tablet (40 mg total) by mouth daily. 90 tablet 1   metoprolol  tartrate (LOPRESSOR ) 25 MG tablet Take 1 tablet (25 mg total) by mouth 2 (two) times daily. 180  tablet 3   rosuvastatin  (CRESTOR ) 10 MG tablet Take 1 tablet (10 mg total) by mouth daily. 90 tablet 3   traZODone  (DESYREL ) 50 MG tablet Take 25-50 mg by mouth at bedtime as needed.     No current facility-administered medications for this visit.    Patient confirms/reports the following allergies:  No Known Allergies  No orders of the defined types were placed in this encounter.   AUTHORIZATION INFORMATION Primary Insurance: 1D#: Group #:  Secondary Insurance: 1D#: Group #:  SCHEDULE INFORMATION: Date: 12/05/23 Time: Location: [facility]

## 2023-09-24 NOTE — Progress Notes (Signed)
 Safety precautions to be maintained throughout the outpatient stay will include: orient to surroundings, keep bed in low position, maintain call bell within reach at all times, provide assistance with transfer out of bed and ambulation.

## 2023-09-25 ENCOUNTER — Encounter: Admitting: Physical Therapy

## 2023-09-25 NOTE — Telephone Encounter (Signed)
 Form filled, scanned and faxed with following information:   Hold Plavix  5 days before colonoscopy. Restart as soon as safely possible (24 hours post colonoscopy).   Luke Shade, MD

## 2023-09-26 ENCOUNTER — Telehealth: Payer: Self-pay

## 2023-09-26 NOTE — Telephone Encounter (Signed)
 Received from Dr.Blair -patient may stop Plavix  5 days prior to procedure as resume as safely possible -24 hours post procedure. Left detailed message letting patient know.

## 2023-09-27 ENCOUNTER — Encounter: Admitting: Physical Therapy

## 2023-09-29 LAB — COMP. METABOLIC PANEL (12)
AST: 20 IU/L (ref 0–40)
Albumin: 4.2 g/dL (ref 3.8–4.8)
Alkaline Phosphatase: 105 IU/L (ref 44–121)
BUN/Creatinine Ratio: 13 (ref 12–28)
BUN: 17 mg/dL (ref 8–27)
Bilirubin Total: 0.5 mg/dL (ref 0.0–1.2)
Calcium: 9.8 mg/dL (ref 8.7–10.3)
Chloride: 104 mmol/L (ref 96–106)
Creatinine, Ser: 1.26 mg/dL — ABNORMAL HIGH (ref 0.57–1.00)
Globulin, Total: 3.2 g/dL (ref 1.5–4.5)
Glucose: 100 mg/dL — ABNORMAL HIGH (ref 70–99)
Potassium: 4.6 mmol/L (ref 3.5–5.2)
Sodium: 142 mmol/L (ref 134–144)
Total Protein: 7.4 g/dL (ref 6.0–8.5)
eGFR: 45 mL/min/1.73 — ABNORMAL LOW (ref >59)

## 2023-09-29 LAB — SEDIMENTATION RATE: Sed Rate: 12 mm/h (ref 0–40)

## 2023-09-29 LAB — MAGNESIUM: Magnesium: 2.4 mg/dL — ABNORMAL HIGH (ref 1.6–2.3)

## 2023-09-29 LAB — 25-HYDROXY VITAMIN D LCMS D2+D3
25-Hydroxy, Vitamin D-2: 2 ng/mL
25-Hydroxy, Vitamin D-3: 38 ng/mL
25-Hydroxy, Vitamin D: 40 ng/mL

## 2023-09-29 LAB — VITAMIN B12: Vitamin B-12: 257 pg/mL (ref 232–1245)

## 2023-09-29 LAB — C-REACTIVE PROTEIN: CRP: <1 mg/L (ref 0–10)

## 2023-10-02 ENCOUNTER — Encounter: Admitting: Physical Therapy

## 2023-10-03 ENCOUNTER — Other Ambulatory Visit (INDEPENDENT_AMBULATORY_CARE_PROVIDER_SITE_OTHER): Payer: Self-pay | Admitting: Vascular Surgery

## 2023-10-03 DIAGNOSIS — I739 Peripheral vascular disease, unspecified: Secondary | ICD-10-CM

## 2023-10-03 DIAGNOSIS — I701 Atherosclerosis of renal artery: Secondary | ICD-10-CM

## 2023-10-04 ENCOUNTER — Encounter: Admitting: Physical Therapy

## 2023-10-05 ENCOUNTER — Other Ambulatory Visit: Payer: Self-pay | Admitting: Internal Medicine

## 2023-10-05 ENCOUNTER — Encounter (INDEPENDENT_AMBULATORY_CARE_PROVIDER_SITE_OTHER): Payer: Self-pay | Admitting: Vascular Surgery

## 2023-10-05 ENCOUNTER — Ambulatory Visit (INDEPENDENT_AMBULATORY_CARE_PROVIDER_SITE_OTHER): Payer: PPO

## 2023-10-05 ENCOUNTER — Ambulatory Visit (INDEPENDENT_AMBULATORY_CARE_PROVIDER_SITE_OTHER): Payer: PPO | Admitting: Vascular Surgery

## 2023-10-05 VITALS — BP 115/62 | HR 58 | Resp 18 | Ht 62.0 in | Wt 166.0 lb

## 2023-10-05 DIAGNOSIS — I701 Atherosclerosis of renal artery: Secondary | ICD-10-CM | POA: Diagnosis not present

## 2023-10-05 DIAGNOSIS — I739 Peripheral vascular disease, unspecified: Secondary | ICD-10-CM

## 2023-10-05 DIAGNOSIS — E1122 Type 2 diabetes mellitus with diabetic chronic kidney disease: Secondary | ICD-10-CM

## 2023-10-05 DIAGNOSIS — I150 Renovascular hypertension: Secondary | ICD-10-CM

## 2023-10-05 DIAGNOSIS — N1832 Chronic kidney disease, stage 3b: Secondary | ICD-10-CM

## 2023-10-05 NOTE — Assessment & Plan Note (Signed)
 ABIs today are 1.3 on the right and 1.09 on the left triphasic waveforms and normal digital pressures.  Doing well many years status post revascularization.  Continue current medical regimen.  Continue to follow annually.

## 2023-10-05 NOTE — Assessment & Plan Note (Signed)
 Her duplex today shows a widely patent right renal artery stent and a solitary right kidney.  Continue antiplatelet therapy and statin agent.  Continue to follow annually.

## 2023-10-05 NOTE — Progress Notes (Signed)
 MRN : [id number]  Danielle Park is a 73 y.o. (03-06-1951) female who presents with chief complaint of  Chief Complaint  Patient presents with   Follow-up    1 year follow up carotid/ABI  .  History of Present Illness: Patient returns today in follow up of multiple vascular issues.  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  Her blood pressure control has been good.  Her chronic kidney disease has been stable now for many years.  Her duplex today shows a widely patent right renal artery stent and a solitary right kidney. She is also many years status post treatment for PAD and short distance claudication.  She now walks regularly with no limitation.  No rest pain.  No ulceration. ABIs today are 1.3 on the right and 1.09 on the left triphasic waveforms and normal digital pressures.   Current Outpatient Medications  Medication Sig Dispense Refill   aspirin 81 MG tablet Take 81 mg by mouth daily.     cholecalciferol (VITAMIN D3) 25 MCG (1000 UT) tablet Take 2,000 Units by mouth daily.     clopidogrel  (PLAVIX ) 75 MG tablet Take 1 tablet (75 mg total) by mouth daily. 90 tablet 3   dapagliflozin  propanediol (FARXIGA ) 10 MG TABS tablet Take 1 tablet (10 mg total) by mouth daily before breakfast. 90 tablet 1   Dulaglutide  (TRULICITY ) 4.5 MG/0.5ML SOPN INJECT 4.5MG  (0.5ML) UNDER THE SKIN ONCE A WEEK 8 mL 3   furosemide  (LASIX ) 20 MG tablet Take 1 tablet (20 mg total) by mouth daily. 90 tablet 3   levothyroxine  (SYNTHROID ) 75 MCG tablet TAKE ONE TABLET BY MOUTH ONCE DAILY BEFORE BREAKFAST 90 tablet 1   lisinopril  (ZESTRIL ) 40 MG tablet Take 1 tablet (40 mg total) by mouth daily. 90 tablet 1   metoprolol  tartrate (LOPRESSOR ) 25 MG tablet Take 1 tablet (25 mg total) by mouth 2 (two) times daily. 180 tablet 3   rosuvastatin  (CRESTOR ) 10 MG tablet Take 1 tablet (10 mg total) by mouth daily. 90 tablet 3   traZODone  (DESYREL ) 50 MG  tablet Take 25-50 mg by mouth at bedtime as needed.     No current facility-administered medications for this visit.    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   Congenital single kidney    Diabetes mellitus    Hypertension    Right ankle pain 04/18/2016   UTI (urinary tract infection)     Past Surgical History:  Procedure Laterality Date   CESAREAN SECTION     PERIPHERAL VASCULAR CATHETERIZATION N/A 01/11/2015   Procedure: Abdominal Aortogram w/Lower Extremity;  Surgeon: Selinda GORMAN Gu, MD;  Location: [facility];  Service: Cardiovascular;  Laterality: N/A;   PERIPHERAL VASCULAR CATHETERIZATION  01/11/2015   Procedure: Lower Extremity Intervention;  Surgeon: Selinda GORMAN Gu, MD;  Location: [facility];  Service: Cardiovascular;;   RENAL ARTERY STENT  2014   Dr. Gu at [facility]   VAGINAL DELIVERY       Social History   Tobacco Use   Smoking status: Never   Smokeless tobacco: Never  Substance Use Topics   Alcohol use: No    Alcohol/week: 0.0 standard drinks of alcohol   Drug use: No      Family History  Problem Relation Age of  Onset   Hypertension Mother    Diabetes Mother    Heart disease Mother        s/p CABG x 2   Hypertension Father    Cancer Father        lung    Diabetes Sister    Heart disease Sister    Heart disease Sister    Cancer - Lung Brother    Cancer Sister        lung   Heart disease Other    Diabetes Other      No Known Allergies   REVIEW OF SYSTEMS (Negative unless checked)   Constitutional: [] Weight loss  [] Fever  [] Chills Cardiac: [] Chest pain   [] Chest pressure   [] Palpitations   [] Shortness of breath when laying flat   [] Shortness of breath at rest   [] Shortness of breath with exertion. Vascular:  [x] Pain in legs with walking   [] Pain in legs at rest   [] Pain in legs when laying flat   [x] Claudication   [] Pain in feet when  walking  [] Pain in feet at rest  [] Pain in feet when laying flat   [] History of DVT   [] Phlebitis   [] Swelling in legs   [] Varicose veins   [] Non-healing ulcers Pulmonary:   [] Uses home oxygen   [] Productive cough   [] Hemoptysis   [] Wheeze  [] COPD   [] Asthma Neurologic:  [] Dizziness  [] Blackouts   [] Seizures   [] History of stroke   [] History of TIA  [] Aphasia   [] Temporary blindness   [] Dysphagia   [] Weakness or numbness in arms   [] Weakness or numbness in legs Musculoskeletal:  [x] Arthritis   [] Joint swelling   [] Joint pain   [] Low back pain Hematologic:  [] Easy bruising  [] Easy bleeding   [] Hypercoagulable state   [] Anemic   Gastrointestinal:  [] Blood in stool   [] Vomiting blood  [] Gastroesophageal reflux/heartburn   [] Abdominal pain Genitourinary:  [x] Chronic kidney disease   [] Difficult urination  [] Frequent urination  [] Burning with urination   [x] Hematuria Skin:  [] Rashes   [] Ulcers   [] Wounds Psychological:  [] History of anxiety   []  History of major depression.   Physical Examination  BP 115/62 (BP Location: Right Arm, Patient Position: Sitting, Cuff Size: Normal)   Pulse (!) 58   Resp 18   Ht 5' 2 (1.575 m)   Wt 166 lb (75.3 kg)   BMI 30.36 kg/m  Gen:  WD/WN, NAD Head: Keys/AT, No temporalis wasting. Ear/Nose/Throat: Hearing grossly intact, nares w/o erythema or drainage Eyes: Conjunctiva clear. Sclera non-icteric Neck: Supple.  Trachea midline Pulmonary:  Good air movement, no use of accessory muscles.  Cardiac: RRR, no JVD Vascular:  Vessel Right Left  Radial Palpable Palpable                          PT Palpable Palpable  DP Palpable Palpable   Gastrointestinal: soft, non-tender/non-distended. No guarding/reflex.  Musculoskeletal: M/S 5/5 throughout.  No deformity or atrophy. No edema. Neurologic: Sensation grossly intact in extremities.  Symmetrical.  Speech is fluent.  Psychiatric: Judgment intact, Mood & affect appropriate for pt's clinical  situation. Dermatologic: No rashes or ulcers noted.  No cellulitis or open wounds.      Labs Recent Results (from the past 2160 hours)  Basic Metabolic Panel (BMET)     Status: Abnormal   Collection Time: 07/30/23  1:40 PM  Result Value Ref Range   Sodium 135 135 - 145 mEq/L   Potassium 4.7  3.5 - 5.1 mEq/L   Chloride 100 96 - 112 mEq/L   CO2 27 19 - 32 mEq/L   Glucose, Bld 116 (H) 70 - 99 mg/dL   BUN 23 6 - 23 mg/dL   Creatinine, Ser 8.64 (H) 0.40 - 1.20 mg/dL   GFR 60.74 (L) >39.99 mL/min    Comment: Calculated using the CKD-EPI Creatinine Equation (2021)   Calcium  9.8 8.4 - 10.5 mg/dL  HgB J8r     Status: None   Collection Time: 07/30/23  1:40 PM  Result Value Ref Range   Hgb A1c MFr Bld 5.6 4.6 - 6.5 %    Comment: Glycemic Control Guidelines for People with Diabetes:Non Diabetic:  <6%Goal of Therapy: <7%Additional Action Suggested:  >8%   ECHOCARDIOGRAM COMPLETE     Status: None   Collection Time: 08/10/23  1:07 PM  Result Value Ref Range   AR max vel 1.97 cm2   AV Peak grad 5.5 mmHg   Ao pk vel 1.17 m/s   S' Lateral 3.20 cm   Area-P 1/2 3.48 cm2   AV Area VTI 2.14 cm2   AV Mean grad 3.0 mmHg   Single Plane A4C EF 51.6 %   Single Plane A2C EF 58.6 %   Calc EF 55.6 %   AV Area mean vel 1.94 cm2   Est EF 55 - 60%   TSH     Status: None   Collection Time: 09/19/23 11:36 AM  Result Value Ref Range   TSH 0.88 0.35 - 5.50 uIU/mL  Comp Met (CMET)     Status: Abnormal   Collection Time: 09/19/23 11:36 AM  Result Value Ref Range   Sodium 137 135 - 145 mEq/L   Potassium 4.6 3.5 - 5.1 mEq/L   Chloride 104 96 - 112 mEq/L   CO2 27 19 - 32 mEq/L   Glucose, Bld 122 (H) 70 - 99 mg/dL   BUN 24 (H) 6 - 23 mg/dL   Creatinine, Ser 8.66 (H) 0.40 - 1.20 mg/dL   Total Bilirubin 0.4 0.2 - 1.2 mg/dL   Alkaline Phosphatase 88 39 - 117 U/L   AST 18 0 - 37 U/L   ALT 7 0 - 35 U/L   Total Protein 7.3 6.0 - 8.3 g/dL   Albumin 4.0 3.5 - 5.2 g/dL   GFR 60.07 (L) >39.99 mL/min     Comment: Calculated using the CKD-EPI Creatinine Equation (2021)   Calcium  9.5 8.4 - 10.5 mg/dL  Comp. Metabolic Panel (12)     Status: Abnormal   Collection Time: 09/24/23 11:37 AM  Result Value Ref Range   Glucose 100 (H) 70 - 99 mg/dL   BUN 17 8 - 27 mg/dL   Creatinine, Ser 8.73 (H) 0.57 - 1.00 mg/dL   eGFR 45 (L) >40 fO/fpw/8.26   BUN/Creatinine Ratio 13 12 - 28   Sodium 142 134 - 144 mmol/L   Potassium 4.6 3.5 - 5.2 mmol/L   Chloride 104 96 - 106 mmol/L   Calcium  9.8 8.7 - 10.3 mg/dL   Total Protein 7.4 6.0 - 8.5 g/dL   Albumin 4.2 3.8 - 4.8 g/dL   Globulin, Total 3.2 1.5 - 4.5 g/dL   Bilirubin Total 0.5 0.0 - 1.2 mg/dL   Alkaline Phosphatase 105 44 - 121 IU/L   AST 20 0 - 40 IU/L  Magnesium     Status: Abnormal   Collection Time: 09/24/23 11:37 AM  Result Value Ref Range   Magnesium 2.4 (H) 1.6 -  2.3 mg/dL  Vitamin B12     Status: None   Collection Time: 09/24/23 11:37 AM  Result Value Ref Range   Vitamin B-12 257 232 - 1,245 pg/mL  Sedimentation rate     Status: None   Collection Time: 09/24/23 11:37 AM  Result Value Ref Range   Sed Rate 12 0 - 40 mm/hr  25-Hydroxy vitamin D  Lcms D2+D3     Status: None   Collection Time: 09/24/23 11:37 AM  Result Value Ref Range   25-Hydroxy, Vitamin D  40 ng/mL    Comment: Reference Range: All Ages: Target levels 30 - 100    25-Hydroxy, Vitamin D -2 2.0 ng/mL    Comment: This test was developed and its performance characteristics determined by [facility]. It has not been cleared or approved by the Food and Drug Administration.     25-Hydroxy, Vitamin D -3 38 ng/mL    Comment: This test was developed and its performance characteristics determined by [facility]. It has not been cleared or approved by the Food and Drug Administration.    C-reactive protein     Status: None   Collection Time: 09/24/23 11:37 AM  Result Value Ref Range   CRP <1 0 - 10 mg/L    Radiology No results found.  Assessment/Plan  Renal artery stenosis  (HCC)  Her duplex today shows a widely patent right renal artery stent and a solitary right kidney.  Continue antiplatelet therapy and statin agent.  Continue to follow annually.  PVD (peripheral vascular disease) (HCC) ABIs today are 1.3 on the right and 1.09 on the left triphasic waveforms and normal digital pressures.  Doing well many years status post revascularization.  Continue current medical regimen.  Continue to follow annually.  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  Diabetes (HCC) blood glucose control important in reducing the progression of atherosclerotic disease. Also, involved in wound healing. On appropriate medications.     CKD (chronic kidney disease), stage III (HCC) Recently saw her nephrologist.  eGFR this year have been between 39 and 45.  Avoid dehydration.  Continue to monitor  Selinda Gu, MD  10/05/2023 9:26 AM    This note was created with Dragon medical transcription system.  Any errors from dictation are purely unintentional

## 2023-10-08 LAB — VAS US ABI WITH/WO TBI
Left ABI: 1.09
Right ABI: 1.34

## 2023-10-09 ENCOUNTER — Encounter: Admitting: Physical Therapy

## 2023-10-11 ENCOUNTER — Telehealth: Payer: Self-pay

## 2023-10-11 ENCOUNTER — Encounter: Admitting: Physical Therapy

## 2023-10-11 NOTE — Progress Notes (Signed)
 Complex Care Management Note  Care Guide Note 10/11/2023 Name: Danielle Park MRN: [id number] DOB: 05-13-1950  Danielle Park is a 73 y.o. year old female who sees Bair, Kalpana, MD for primary care. I reached out to Graylin JINNY Moats by phone today to offer complex care management services.  [name] was given information about Complex Care Management services today including:   The Complex Care Management services include support from the care team which includes your Nurse Care Manager, Clinical Social Worker, or Pharmacist.  The Complex Care Management team is here to help remove barriers to the health concerns and goals most important to you. Complex Care Management services are voluntary, and the patient may decline or stop services at any time by request to their care team member.   Complex Care Management Consent Status: Patient did not agree to participate in complex care management services at this time.  Follow up plan: Patient will follow up with PCP.   Encounter Outcome:  Patient Refused  Dreama Agent [facility], [facility] Health Care Management Assistant Direct Dial: [phone]  Fax: [phone]

## 2023-10-11 NOTE — Progress Notes (Signed)
 Complex Care Management Note Care Guide Note  10/11/2023 Name: Danielle Park MRN: [id number] DOB: 1950-10-01   Complex Care Management Outreach Attempts: An unsuccessful telephone outreach was attempted today to offer the patient information about available complex care management services.  Follow Up Plan:  Additional outreach attempts will be made to offer the patient complex care management information and services.   Encounter Outcome:  No Answer  Dreama [facility]  [facility], [facility] Care Management Assistant Direct Dial: [phone]  Fax: [phone]

## 2023-10-14 NOTE — Progress Notes (Unsigned)
 PROVIDER NOTE: Interpretation of information contained herein should be left to medically-trained personnel. Specific patient instructions are provided elsewhere under Patient Instructions section of medical record. This document was created in part using AI and STT-dictation technology, any transcriptional errors that may result from this process are unintentional.  Patient: Danielle Park  Service: E/M   PCP: Abbey Bruckner, MD  DOB: 24-Jun-1950  DOS: 10/15/2023  Provider: Eric DELENA Como, MD  MRN: [id number]  Delivery: Face-to-face  Specialty: Interventional Pain Management  Type: Established Patient  Setting: Ambulatory outpatient facility  Specialty designation: 09  Referring Prov.: Bair, Bruckner, MD  Location: Outpatient office facility       Primary Reason(s) for Visit: Encounter for evaluation before starting new chronic pain management plan of care (Level of risk: moderate) CC: No chief complaint on file.  HPI  Danielle Park is a 73 y.o. year old, female patient, who comes today for a follow-up evaluation to review the test results and decide on a treatment plan. She has Renal artery stenosis (HCC); Hypothyroidism; Solitary kidney; PVD (peripheral vascular disease) (HCC); Renovascular hypertension; Chronic thoracic back pain (1ry area of Pain) (Intermittent) (Bilateral); Screening for colorectal cancer; Fatty liver; Low back pain; Hyperlipidemia; Stage 3b chronic kidney disease (HCC); Vitamin D  deficiency; Heartburn; Proteinuria; Insomnia; Vertigo; Grief; DM type 2 causing CKD stage 3 (HCC); Chronic pain syndrome; Pharmacologic therapy; Disorder of skeletal system; Problems influencing health status; Myofascial pain syndrome of thoracic spine; Spasm of thoracic back muscle; and DISH (diffuse idiopathic skeletal hyperostosis) on their problem list. Her primarily concern today is the No chief complaint on file.  Pain Assessment: Location:     Radiating:   Onset:   Duration:   Quality:    Severity:  /10 (subjective, self-reported pain score)  Effect on ADL:   Timing:   Modifying factors:   BP:    HR:    Danielle Park comes in today for a follow-up visit after her initial evaluation on 09/24/2023. Today we went over the results of her tests. These were explained in Layman's terms. During today's appointment we went over my diagnostic impression, as well as the proposed treatment plan.  Review of initial evaluation (09/24/2023): Danielle Park is a 73 year old female who presents with chronic low back pain.   She experiences chronic low back pain located at her bra line, which can occur on either side and is associated with arm movements. The pain is described as a stabbing sensation, similar to a knife being stuck in her back, and is associated with muscle spasms. The pain is intermittent and has been present for years, worsening over time. It is easily aggravated by activities such as standing exercises or touching her toes, leading to muscle spasms that can last for days. Currently, she is not experiencing pain as long as she avoids activities that trigger it.   The initial episode of pain occurred years ago while she was at work [employer], which involved repetitive pulling motions. She describes the pain as feeling like 'somebody stuck a knife in my back' when she bent down to pull something. The pain at that time did not last long.   She recalls a recent incident on April 2nd when she fell while moving a table upstairs, resulting in a concussion. She was evaluated in the emergency room where x-rays of her back and hip were performed, showing no fractures.   She recalls being told by a previous doctor that her pain could be  related to her trapezius muscles and that she may have developed scar tissue in those muscles.   No current pain as long as she refrains from activities that trigger it.  Review of diagnostic test ordered on 09/24/2023:  Diagnostic lab work:     ***    Diagnostic imaging:    ***     Discussed the use of AI scribe software for clinical note transcription with the patient, who gave verbal consent to proceed.  History of Present Illness          Patient presented with interventional treatment options. Danielle Park was informed that I will not be providing medication management. Pharmacotherapy evaluation including recommendations may be offered, if specifically requested.   Controlled Substance Pharmacotherapy Assessment REMS (Risk Evaluation and Mitigation Strategy)  Opioid Analgesic: None MME/day: 0 mg/day   Pill Count: None expected due to no prior prescriptions written by our practice. No notes on file  Pharmacokinetics: Liberation and absorption (onset of action): WNL Distribution (time to peak effect): WNL Metabolism and excretion (duration of action): WNL         Pharmacodynamics: Desired effects: Analgesia: Danielle Park reports >50% benefit. Functional ability: Patient reports that medication allows her to accomplish basic ADLs Clinically meaningful improvement in function (CMIF): Sustained CMIF goals met Perceived effectiveness: Described as relatively effective, allowing for increase in activities of daily living (ADL) Undesirable effects: Side-effects or Adverse reactions: None reported Monitoring: [place] PMP: PDMP reviewed during this encounter. Online review of the past 40-month period previously conducted. Not applicable at this point since we have not taken over the patient's medication management yet. List of other Serum/Urine Drug Screening Test(s):  No results found for: AMPHSCRSER, BARBSCRSER, BENZOSCRSER, COCAINSCRSER, COCAINSCRNUR, PCPSCRSER, THCSCRSER, THCU, CANNABQUANT, OPIATESCRSER, OXYSCRSER, PROPOXSCRSER, ETH, CBDTHCR, D8THCCBX, D9THCCBX List of all UDS test(s) done:  No results found for: TOXASSSELUR, SUMMARY Last UDS on record: No results found for: TOXASSSELUR,  SUMMARY UDS interpretation: No unexpected findings.          Medication Assessment Form: Not applicable. No opioids. Treatment compliance: Not applicable Risk Assessment Profile: Aberrant behavior: See initial evaluations. None observed or detected today Comorbid factors increasing risk of overdose: See initial evaluation. No additional risks detected today Opioid risk tool (ORT):      No data to display          ORT Scoring interpretation table:  Score <3 = Low Risk for SUD  Score between 4-7 = Moderate Risk for SUD  Score >8 = High Risk for Opioid Abuse   Risk of substance use disorder (SUD): Low  Risk Mitigation Strategies:  Patient opioid safety counseling: No controlled substances prescribed. Patient-Prescriber Agreement (PPA): No agreement signed.  Controlled substance notification to other providers: None required. No opioid therapy.  Pharmacologic Plan: Non-opioid analgesic therapy offered. Interventional alternatives discussed.             Laboratory Chemistry Profile   Renal Lab Results  Component Value Date   BUN 17 09/24/2023   CREATININE 1.26 (H) 09/24/2023   BCR 13 09/24/2023   GFR 39.92 (L) 09/19/2023   GFRAA >60 03/06/2015   GFRNONAA >60 03/06/2015   SPECGRAV <=1.005 (A) 11/06/2022   PHUR 6.5 11/06/2022   PROTEINUR negative 11/06/2022     Electrolytes Lab Results  Component Value Date   NA 142 09/24/2023   K 4.6 09/24/2023   CL 104 09/24/2023   CALCIUM  9.8 09/24/2023   MG 2.4 (H) 09/24/2023   PHOS 3.5 03/29/2020  Hepatic Lab Results  Component Value Date   AST 20 09/24/2023   ALT 7 09/19/2023   ALBUMIN 4.2 09/24/2023   ALKPHOS 105 09/24/2023   LIPASE 42 03/06/2015     ID No results found for: LYMEIGGIGMAB, HIV, SARSCOV2NAA, STAPHAUREUS, MRSAPCR, HCVAB, PREGTESTUR, RMSFIGG, QFVRPH1IGG, QFVRPH2IGG   Bone Lab Results  Component Value Date   VD25OH 37.40 12/09/2018   25OHVITD1 40 09/24/2023   25OHVITD2 2.0  09/24/2023   25OHVITD3 38 09/24/2023     Endocrine Lab Results  Component Value Date   GLUCOSE 100 (H) 09/24/2023   GLUCOSEU 50 (A) 03/06/2015   HGBA1C 5.6 07/30/2023   TSH 0.88 09/19/2023   FREET4 1.33 11/18/2013     Neuropathy Lab Results  Component Value Date   VITAMINB12 257 09/24/2023   HGBA1C 5.6 07/30/2023     CNS No results found for: COLORCSF, APPEARCSF, RBCCOUNTCSF, WBCCSF, POLYSCSF, LYMPHSCSF, EOSCSF, PROTEINCSF, GLUCCSF, JCVIRUS, CSFOLI, IGGCSF, LABACHR, ACETBL   Inflammation (CRP: Acute  ESR: Chronic) Lab Results  Component Value Date   CRP <1 09/24/2023   ESRSEDRATE 12 09/24/2023     Rheumatology No results found for: RF, ANA, LABURIC, URICUR, LYMEIGGIGMAB, LYMEABIGMQN, HLAB27   Coagulation Lab Results  Component Value Date   PLT 231.0 07/17/2016     Cardiovascular Lab Results  Component Value Date   CKTOTAL 80 05/17/2012   CKMB 1.4 05/17/2012   TROPONINI < 0.02 07/24/2012   HGB 13.2 07/17/2016   HCT 40.4 07/17/2016     Screening No results found for: SARSCOV2NAA, COVIDSOURCE, STAPHAUREUS, MRSAPCR, HCVAB, HIV, PREGTESTUR   Cancer No results found for: CEA, CA125, LABCA2   Allergens No results found for: ALMOND, APPLE, ASPARAGUS, AVOCADO, BANANA, BARLEY, BASIL, BAYLEAF, GREENBEAN, LIMABEAN, WHITEBEAN, BEEFIGE, REDBEET, BLUEBERRY, BROCCOLI, CABBAGE, MELON, CARROT, CASEIN, CASHEWNUT, CAULIFLOWER, CELERY     Note: Lab results reviewed.  Recent Diagnostic Imaging Review  Cervical Imaging: Cervical CT wo contrast: Results for orders placed during the hospital encounter of 06/27/23 CT CERVICAL SPINE WO CONTRAST  Narrative CLINICAL DATA:  Clemens downstairs, hit head, neck trauma  EXAM: CT CERVICAL SPINE WITHOUT CONTRAST  TECHNIQUE: Multidetector CT imaging of the cervical spine was performed without intravenous contrast. Multiplanar CT  image reconstructions were also generated.  RADIATION DOSE REDUCTION: This exam was performed according to the departmental dose-optimization program which includes automated exposure control, adjustment of the mA and/or kV according to patient size and/or use of iterative reconstruction technique.  COMPARISON:  None Available.  FINDINGS: Alignment: Alignment is anatomic.  Skull base and vertebrae: No acute fracture. No primary bone lesion or focal pathologic process.  Soft tissues and spinal canal: No prevertebral fluid or swelling. No visible canal hematoma.  Disc levels: There is mild diffuse cervical spondylosis greatest at C4-5, C5-6, and C6-7. No bony encroachment upon the central canal or neural foramina.  Upper chest: Airway is patent. There are multiple small biapical pulmonary nodules, largest measuring 3 mm in the left apex image 73/2.  Other: Reconstructed images demonstrate no additional findings.  IMPRESSION: 1. No acute cervical spine fracture. 2. Multiple small biapical pulmonary nodules, largest measuring 3 mm. Further evaluation with nonemergent chest CT may be useful to assess the remainder of the lung parenchyma.   Electronically Signed By: Ozell Daring M.D. On: 06/27/2023 16:16  Thoracic Imaging: Thoracic DG 4 views: Results for orders placed in visit on 07/17/16 [facility] Thoracic Spine 4V  Narrative CLINICAL DATA:  Thoracic back pain, medial to the left scapula.  EXAM: THORACIC SPINE -  4+ VIEW  COMPARISON:  None.  FINDINGS: Evaluation the superior aspect of the thoracic spine, including the cervicothoracic junction is degraded secondary to overlying osseous soft tissue structures, even on the provided swimmer's radiograph.  Normal alignment of the thoracic spine. No definite anterolisthesis or retrolisthesis. Thoracic vertebral body heights appear preserved.  Stigmata of DISH throughout the mid in caudal aspects of the thoracic spine.  Thoracic intervertebral disc space heights appear preserved.  Limited visualization of the adjacent thorax is normal. Atherosclerotic plaque within the thoracic aorta.  IMPRESSION: 1. No definite acute findings. 2. Stigmata of DISH within the thoracic spine. 3.  Aortic Atherosclerosis (ICD10-I70.0).   Electronically Signed By: Norleen Roulette M.D. On: 07/17/2016 18:39  Hip Imaging: Hip-R DG 2-3 views: Results for orders placed during the hospital encounter of 12/16/14 DG HIP UNILAT WITH PELVIS 2-3 VIEWS RIGHT  Narrative CLINICAL DATA:  Chronic right hip pain.  No known injury.  EXAM: DG HIP (WITH OR WITHOUT PELVIS) 2-3V RIGHT  COMPARISON:  None.  FINDINGS: There is no evidence of hip fracture or dislocation. There is no evidence of arthropathy or other focal bone abnormality.  IMPRESSION: Negative.   Electronically Signed By: Lynwood Hugger M.D. On: 12/16/2014 15:38  Hip-L DG 2-3 views: Results for orders placed during the hospital encounter of 06/27/23 DG Hip Unilat W or Wo Pelvis 2-3 Views Left  Narrative CLINICAL DATA:  Clemens backwards, left hip pain  EXAM: DG HIP (WITH OR WITHOUT PELVIS) 2-3V LEFT  COMPARISON:  None Available.  FINDINGS: Frontal view of the pelvis as well as frontal and frogleg lateral views of the left hip are obtained. No acute fracture, subluxation, or dislocation. Joint spaces are relatively well preserved. Sacroiliac joints are unremarkable. Diffuse atherosclerosis. Stent in the right iliac distribution.  IMPRESSION: 1. No acute displaced fracture.   Electronically Signed By: Ozell Daring M.D. On: 06/27/2023 16:17  Foot Imaging: Foot-R DG Complete: Results for orders placed in visit on 06/08/14 DG Foot Complete Right  Narrative 3 views of the right foot demonstrates osseous R foot rectus in nature with large plantar distal aorta and a calcaneal heel spur and a retrocalcaneal proximally oriented calcaneal spur. Soft tissue  increasing density of the plantar fascial calcaneal insertion site is indicative of plantar fasciitis. I see no fractures.  Foot-L DG Complete: Results for orders placed in visit on 06/08/14 DG Foot Complete Left  Narrative 3 views of the left foot demonstrates an osseously mature individual retrocalcaneal heel spurs and plantar distally or any calcaneal spurs are noted. Along with exostosis hallux left also noted. Tissue increasing density at the plantar fascial calcaneal insertion site of the left heel at the spur site. Otherwise no osseous abnormalities.  Complexity Note: Imaging results reviewed.                         Meds   Current Outpatient Medications:    aspirin 81 MG tablet, Take 81 mg by mouth daily., Disp: , Rfl:    cholecalciferol (VITAMIN D3) 25 MCG (1000 UT) tablet, Take 2,000 Units by mouth daily., Disp: , Rfl:    clopidogrel  (PLAVIX ) 75 MG tablet, Take 1 tablet (75 mg total) by mouth daily., Disp: 90 tablet, Rfl: 3   dapagliflozin  propanediol (FARXIGA ) 10 MG TABS tablet, Take 1 tablet (10 mg total) by mouth daily before breakfast., Disp: 90 tablet, Rfl: 1   Dulaglutide  (TRULICITY ) 4.5 MG/0.5ML SOPN, INJECT 4.5MG  (0.5ML) UNDER THE SKIN ONCE A  WEEK, Disp: 8 mL, Rfl: 3   furosemide  (LASIX ) 20 MG tablet, Take 1 tablet (20 mg total) by mouth daily., Disp: 90 tablet, Rfl: 3   levothyroxine  (SYNTHROID ) 75 MCG tablet, TAKE ONE TABLET BY MOUTH ONCE DAILY BEFORE BREAKFAST, Disp: 90 tablet, Rfl: 1   lisinopril  (ZESTRIL ) 40 MG tablet, Take 1 tablet (40 mg total) by mouth daily., Disp: 90 tablet, Rfl: 1   metoprolol  tartrate (LOPRESSOR ) 25 MG tablet, Take 1 tablet (25 mg total) by mouth 2 (two) times daily., Disp: 180 tablet, Rfl: 3   rosuvastatin  (CRESTOR ) 10 MG tablet, Take 1 tablet (10 mg total) by mouth daily., Disp: 90 tablet, Rfl: 3   traZODone  (DESYREL ) 50 MG tablet, Take 25-50 mg by mouth at bedtime as needed., Disp: , Rfl:   ROS  Constitutional: Denies any fever or  chills Gastrointestinal: No reported hemesis, hematochezia, vomiting, or acute GI distress Musculoskeletal: Denies any acute onset joint swelling, redness, loss of ROM, or weakness Neurological: No reported episodes of acute onset apraxia, aphasia, dysarthria, agnosia, amnesia, paralysis, loss of coordination, or loss of consciousness  Allergies  Danielle Park has no known allergies.  PFSH  Drug: Danielle Park  reports no history of drug use. Alcohol:  reports no history of alcohol use. Tobacco:  reports that she has never smoked. She has never used smokeless tobacco. Medical:  has a past medical history of Chicken pox, Congenital doubling of uterus, Congenital single kidney, Diabetes mellitus, Hypertension, Right ankle pain (04/18/2016), and UTI (urinary tract infection). Surgical: Danielle Park  has a past surgical history that includes Cesarean section; Vaginal delivery; Renal artery stent (2014); Cardiac catheterization (N/A, 01/11/2015); and Cardiac catheterization (01/11/2015). Family: family history includes Cancer in her father and sister; Cancer - Lung in her brother; Diabetes in her mother, sister, and another family member; Heart disease in her mother, sister, sister, and another family member; Hypertension in her father and mother.  Constitutional Exam  General appearance: Well nourished, well developed, and well hydrated. In no apparent acute distress There were no vitals filed for this visit. BMI Assessment: Estimated body mass index is 30.36 kg/m as calculated from the following:   Height as of 10/05/23: 5' 2 (1.575 m).   Weight as of 10/05/23: 166 lb (75.3 kg).  BMI interpretation table: BMI level Category Range association with higher incidence of chronic pain  <18 kg/m2 Underweight   18.5-24.9 kg/m2 Ideal body weight   25-29.9 kg/m2 Overweight Increased incidence by 20%  30-34.9 kg/m2 Obese (Class I) Increased incidence by 68%  35-39.9 kg/m2 Severe obesity (Class II) Increased  incidence by 136%  >40 kg/m2 Extreme obesity (Class III) Increased incidence by 254%   Patient's current BMI Ideal Body weight  There is no height or weight on file to calculate BMI. Ideal body weight: 50.1 kg (110 lb 7.2 oz) Adjusted ideal body weight: 60.2 kg (132 lb 10.7 oz)   BMI Readings from Last 4 Encounters:  10/05/23 30.36 kg/m  09/24/23 30.73 kg/m  09/19/23 30.80 kg/m  08/02/23 29.48 kg/m   Wt Readings from Last 4 Encounters:  10/05/23 166 lb (75.3 kg)  09/24/23 168 lb (76.2 kg)  09/19/23 168 lb 6.4 oz (76.4 kg)  08/02/23 166 lb 6.4 oz (75.5 kg)    Psych/Mental status: Alert, oriented x 3 (person, place, & time)       Eyes: PERLA Respiratory: No evidence of acute respiratory distress  Assessment & Plan  Primary Diagnosis & Pertinent Problem List: The primary encounter diagnosis  was Chronic thoracic back pain (1ry area of Pain) (Intermittent) (Bilateral). Diagnoses of DISH (diffuse idiopathic skeletal hyperostosis), Myofascial pain syndrome of thoracic spine, and Spasm of thoracic back muscle were also pertinent to this visit. Visit Diagnosis: 1. Chronic thoracic back pain (1ry area of Pain) (Intermittent) (Bilateral)   2. DISH (diffuse idiopathic skeletal hyperostosis)   3. Myofascial pain syndrome of thoracic spine   4. Spasm of thoracic back muscle    Problems updated and reviewed during this visit: No problems updated.  Plan of Care  Assessment and Plan             Pharmacotherapy (Medications Ordered): No orders of the defined types were placed in this encounter.  Procedure Orders    No procedure(s) ordered today   No orders of the defined types were placed in this encounter.  Lab Orders  No laboratory test(s) ordered today   Imaging Orders  No imaging studies ordered today   Referral Orders  No referral(s) requested today    Pharmacological management:  Opioid Analgesics: I will not be prescribing any opioids at this time Membrane  stabilizer: I will not be prescribing any at this time Muscle relaxant: I will not be prescribing any at this time NSAID: I will not be prescribing any at this time Other analgesic(s): I will not be prescribing any at this time      Interventional Therapies  Risk Factors  Considerations  Medical Comorbidities:     Planned  Pending:      Under consideration:   Pending   Completed: (Analgesic benefit)1  None at this time   Therapeutic  Palliative (PRN) options:   None established   Completed by other providers:   None reported  1(Analgesic benefit): Expressed in percentage (%). (Local anesthetic[LA] +/- sedation  L.A.Local Anesthetic  Steroid benefit  Ongoing benefit)      Provider-requested follow-up: No follow-ups on file. Recent Visits Date Type Provider Dept  09/24/23 Office Visit Tanya Glisson, MD [facility]  Showing recent visits within past 90 days and meeting all other requirements Future Appointments Date Type Provider Dept  10/15/23 Appointment Tanya Glisson, MD [facility]  Showing future appointments within next 90 days and meeting all other requirements   Primary Care Physician: Bair, Kalpana, MD  Duration of encounter: *** minutes.  Total time on encounter, as per AMA guidelines included both the face-to-face and non-face-to-face time personally spent by the physician and/or other qualified health care professional(s) on the day of the encounter (includes time in activities that require the physician or other qualified health care professional and does not include time in activities normally performed by clinical staff). Physician's time may include the following activities when performed: Preparing to see the patient (e.g., pre-charting review of records, searching for previously ordered imaging, lab work, and nerve conduction tests) Review of prior analgesic pharmacotherapies. Reviewing PMP Interpreting ordered tests  (e.g., lab work, imaging, nerve conduction tests) Performing post-procedure evaluations, including interpretation of diagnostic procedures Obtaining and/or reviewing separately obtained history Performing a medically appropriate examination and/or evaluation Counseling and educating the patient/family/caregiver Ordering medications, tests, or procedures Referring and communicating with other health care professionals (when not separately reported) Documenting clinical information in the electronic or other health record Independently interpreting results (not separately reported) and communicating results to the patient/ family/caregiver Care coordination (not separately reported)  Note by: Glisson DELENA Tanya, MD (TTS technology used. I apologize for any typographical errors that were not detected and corrected.) Date:  10/15/2023; Time: 8:20 PM

## 2023-10-15 ENCOUNTER — Encounter: Payer: Self-pay | Admitting: Pain Medicine

## 2023-10-15 ENCOUNTER — Ambulatory Visit: Attending: Pain Medicine | Admitting: Pain Medicine

## 2023-10-15 VITALS — BP 145/72 | HR 74 | Temp 97.4°F | Resp 16 | Ht 62.0 in | Wt 166.0 lb

## 2023-10-15 DIAGNOSIS — M62830 Muscle spasm of back: Secondary | ICD-10-CM | POA: Insufficient documentation

## 2023-10-15 DIAGNOSIS — G8929 Other chronic pain: Secondary | ICD-10-CM | POA: Diagnosis present

## 2023-10-15 DIAGNOSIS — M7918 Myalgia, other site: Secondary | ICD-10-CM | POA: Insufficient documentation

## 2023-10-15 DIAGNOSIS — M4810 Ankylosing hyperostosis [Forestier], site unspecified: Secondary | ICD-10-CM | POA: Diagnosis present

## 2023-10-15 DIAGNOSIS — M546 Pain in thoracic spine: Secondary | ICD-10-CM | POA: Insufficient documentation

## 2023-10-15 NOTE — Progress Notes (Signed)
 Safety precautions to be maintained throughout the outpatient stay will include: orient to surroundings, keep bed in low position, maintain call bell within reach at all times, provide assistance with transfer out of bed and ambulation.

## 2023-10-16 ENCOUNTER — Encounter: Admitting: Physical Therapy

## 2023-10-18 ENCOUNTER — Encounter: Admitting: Physical Therapy

## 2023-10-23 ENCOUNTER — Encounter: Admitting: Physical Therapy

## 2023-10-24 DIAGNOSIS — M62830 Muscle spasm of back: Secondary | ICD-10-CM

## 2023-10-24 MED ORDER — BACLOFEN 10 MG PO TABS: 10.0000 mg | ORAL_TABLET | Freq: Two times a day (BID) | ORAL | 0 refills | Status: DC | PRN

## 2023-10-25 ENCOUNTER — Encounter: Admitting: Physical Therapy

## 2023-10-30 ENCOUNTER — Encounter: Admitting: Physical Therapy

## 2023-10-30 ENCOUNTER — Telehealth: Payer: Self-pay

## 2023-10-30 DIAGNOSIS — E1159 Type 2 diabetes mellitus with other circulatory complications: Secondary | ICD-10-CM

## 2023-10-30 DIAGNOSIS — M62830 Muscle spasm of back: Secondary | ICD-10-CM

## 2023-10-30 MED ORDER — BACLOFEN 10 MG PO TABS
10.0000 mg | ORAL_TABLET | Freq: Two times a day (BID) | ORAL | 0 refills | Status: AC | PRN
Start: 2023-10-30 — End: 2023-11-04

## 2023-10-30 MED ORDER — LISINOPRIL 40 MG PO TABS: 40.0000 mg | ORAL_TABLET | Freq: Every day | ORAL | 3 refills | Status: DC

## 2023-10-30 NOTE — Telephone Encounter (Signed)
 Colonoscopy has been rescheduled due scheduling error in endo.  Pt has agreed to reschedule from 12/05/23 to 01/23/24.  Instructions updated.  Referral updated.  Thanks,  [place], CMA

## 2023-11-01 ENCOUNTER — Encounter: Admitting: Physical Therapy

## 2023-11-02 ENCOUNTER — Ambulatory Visit: Admitting: Nurse Practitioner

## 2023-11-06 NOTE — Progress Notes (Signed)
 PROVIDER NOTE: Interpretation of information contained herein should be left to medically-trained personnel. Specific patient instructions are provided elsewhere under Patient Instructions section of medical record. This document was created in part using AI and STT-dictation technology, any transcriptional errors that may result from this process are unintentional.  Patient: Danielle Park  Service: E/M   PCP: Abbey Bruckner, MD  DOB: February 09, 1951  DOS: 11/07/2023  Provider: Eric DELENA Como, MD  MRN: [id number]  Delivery: Face-to-face  Specialty: Interventional Pain Management  Type: Established Patient  Setting: Ambulatory outpatient facility  Specialty designation: 09  Referring Prov.: Bair, Kalpana, MD  Location: Outpatient office facility       History of present illness (HPI) [name], a 73 y.o. year old female, is here today because of her Chronic bilateral thoracic back pain [M54.6, G89.29]. Ms. Bartles primary complain today is Back Pain (Thoracic area)  Pertinent problems: [name] has Chronic thoracic back pain (1ry area of Pain) (Intermittent) (Bilateral); Low back pain; Chronic pain syndrome; Myofascial pain syndrome of thoracic spine; Muscle spasm of back; DISH (diffuse idiopathic skeletal hyperostosis); and Other intervertebral disc degeneration, thoracic region on their pertinent problem list.  Pain Assessment: Severity of Chronic pain is reported as a 2 /10. Location: Back Mid/denies. Onset: 1 to 4 weeks ago. Quality: Spasm. Timing:  . Modifying factor(s): stillness. Vitals:  height is 5' 2 (1.575 m) and weight is 166 lb (75.3 kg). Her temporal temperature is 97.3 F (36.3 C) (abnormal). Her blood pressure is 180/69 (abnormal) and her pulse is 62. Her respiration is 14 and oxygen saturation is 100%.  BMI: Estimated body mass index is 30.36 kg/m as calculated from the following:   Height as of this encounter: 5' 2 (1.575 m).   Weight as of this encounter: 166 lb (75.3  kg).  Last encounter: 10/15/2023. Last procedure: Visit date not found.  Reason for encounter: evaluation of worsening, or previously known (established) problem.  The patient came in for the first time on 09/24/2023 at which time we conducted her initial evaluation, ordered lab work and diagnostic x-rays of the thoracic spine.  The patient was referred to physical therapy for initial treatment of intermittent bilateral thoracic back pain with a history significant for DISH (diffuse idiopathic skeletal hyperostosis) and muscle spasms of the thoracic back.  On her second and last visit on 10/15/2023 she came in for follow-up evaluation where we shared the results of her lab work which was significant for an elevated serum creatinine level and decrease estimated GFR as well has elevated magnesium levels.  At the time the patient had not completed her thoracic spine x-rays.  Her primary complain is that of upper back pain at the level of the bra line.  By the patient's own account her pain has been less frequent and less severe since she began physical therapy and her home exercises.  After this last visit the patient seemed to have her pain under control and she was recommended to continue with the physical therapy and to give us  a call if she needed any further evaluation.  This particular visit is as a result of the patient requesting to come back for that evaluation.  As of today 11/07/2023 the patient has not had the x-rays that we had ordered on her very first visit.  Discussed the use of AI scribe software for clinical note transcription with the patient, who gave verbal consent to proceed.  History of Present Illness   Danielle Odom  Park is a 73 year old female who presents with flare-ups of back spasms.  She initially found relief from back pain through physical therapy, experiencing only occasional spasms. During her last session, an exercise involving reaching up and pulling down weights with both arms  triggered spasms on the right side of her back the following day. The spasms began on a Tuesday and persisted through Wednesday, with improvement noted on Thursday. On Friday, carrying groceries in the rain and pulling a heavy cart up the steps contributed to the recurrence of her symptoms.      Pharmacotherapy Assessment   Opioid Analgesic: None MME/day: 0 mg/day   Monitoring: [place] PMP: PDMP reviewed during this encounter.       Pharmacotherapy: No side-effects or adverse reactions reported. Compliance: No problems identified. Effectiveness: Clinically acceptable.  No notes on file  UDS:  No results found for: SUMMARY  No results found for: CBDTHCR No results found for: D8THCCBX No results found for: D9THCCBX  ROS  Constitutional: Denies any fever or chills Gastrointestinal: No reported hemesis, hematochezia, vomiting, or acute GI distress Musculoskeletal: Denies any acute onset joint swelling, redness, loss of ROM, or weakness Neurological: No reported episodes of acute onset apraxia, aphasia, dysarthria, agnosia, amnesia, paralysis, loss of coordination, or loss of consciousness  Medication Review  Dulaglutide , aspirin, baclofen , cholecalciferol, clopidogrel , dapagliflozin  propanediol, furosemide , levothyroxine , lisinopril , metoprolol  tartrate, rosuvastatin , and traZODone   History Review  Allergy: Ms. Swilling has no known allergies. Drug: Ms. Foote  reports no history of drug use. Alcohol:  reports no history of alcohol use. Tobacco:  reports that she has never smoked. She has never used smokeless tobacco. Social: Ms. Chirico  reports that she has never smoked. She has never used smokeless tobacco. She reports that she does not drink alcohol and does not use drugs. Medical:  has a past medical history of Chicken pox, Congenital doubling of uterus, Congenital single kidney, Diabetes mellitus, Hypertension, Right ankle pain (04/18/2016), and UTI (urinary tract  infection). Surgical: Ms. Ramnath  has a past surgical history that includes Cesarean section; Vaginal delivery; Renal artery stent (2014); Cardiac catheterization (N/A, 01/11/2015); and Cardiac catheterization (01/11/2015). Family: family history includes Cancer in her father and sister; Cancer - Lung in her brother; Diabetes in her mother, sister, and another family member; Heart disease in her mother, sister, sister, and another family member; Hypertension in her father and mother.  Laboratory Chemistry Profile   Renal Lab Results  Component Value Date   BUN 17 09/24/2023   CREATININE 1.26 (H) 09/24/2023   BCR 13 09/24/2023   GFR 39.92 (L) 09/19/2023   GFRAA >60 03/06/2015   GFRNONAA >60 03/06/2015    Hepatic Lab Results  Component Value Date   AST 20 09/24/2023   ALT 7 09/19/2023   ALBUMIN 4.2 09/24/2023   ALKPHOS 105 09/24/2023   LIPASE 42 03/06/2015    Electrolytes Lab Results  Component Value Date   NA 142 09/24/2023   K 4.6 09/24/2023   CL 104 09/24/2023   CALCIUM  9.8 09/24/2023   MG 2.4 (H) 09/24/2023   PHOS 3.5 03/29/2020    Bone Lab Results  Component Value Date   VD25OH 37.40 12/09/2018   25OHVITD1 40 09/24/2023   25OHVITD2 2.0 09/24/2023   25OHVITD3 38 09/24/2023    Inflammation (CRP: Acute Phase) (ESR: Chronic Phase) Lab Results  Component Value Date   CRP <1 09/24/2023   ESRSEDRATE 12 09/24/2023         Note: Above Lab results  reviewed.  Recent Imaging Review  VAS US  RENAL ARTERY DUPLEX ABDOMINAL VISCERAL  Patient Name:  BOBBIEJO ISHIKAWA  Date of Exam:   10/05/2023 Medical Rec #: [id number]       Accession #:    [id number] Date of Birth: 14-Dec-1950      Patient Gender: F Patient Age:   70 years Exam Location:  [facility] Procedure:      VAS US  RENAL ARTERY DUPLEX Referring Phys: [id number] JASON S DEW  --------------------------------------------------------------------------------  Indications: Rt Renal Stent                Lt  Kidney Congenital Absence  Vascular Interventions: 2012: Rt Renal Stent.  Performing Technologist: Leafy Gibes RVS    Examination Guidelines: A complete evaluation includes B-mode imaging, spectral Doppler, color Doppler, and power Doppler as needed of all accessible portions of each vessel. Bilateral testing is considered an integral part of a complete examination. Limited examinations for reoccurring indications may be performed as noted.    Duplex Findings: +------------+--------+--------+------+--------+ Mesenteric  PSV cm/sEDV cm/sPlaqueComments +------------+--------+--------+------+--------+ Aorta Prox     54      11                  +------------+--------+--------+------+--------+ Aorta Mid      73      0                   +------------+--------+--------+------+--------+ Aorta Distal   83      15                  +------------+--------+--------+------+--------+          +------------------+--------+--------+-------+ Right Renal ArteryPSV cm/sEDV cm/sComment +------------------+--------+--------+-------+ Origin              111      24           +------------------+--------+--------+-------+ Proximal            101      20           +------------------+--------+--------+-------+ Mid                 108      27           +------------------+--------+--------+-------+ Distal              118      25           +------------------+--------+--------+-------+  +------------+--------+--------+--+-----------+--------+--------+---+ Right KidneyPSV cm/sEDV cm/sRILeft KidneyPSV cm/sEDV cm/sRI  +------------+--------+--------+--+-----------+--------+--------+---+ Upper Pole                    Upper Pole                     +------------+--------+--------+--+-----------+--------+--------+---+ Mid                           Mid                             +------------+--------+--------+--+-----------+--------+--------+---+ Lower Pole                    Lower Pole                     +------------+--------+--------+--+-----------+--------+--------+---+ Hilar  Hilar                          +------------+--------+--------+--+-----------+--------+--------+---+  +------------------+-----+------------------++ Right Kidney           Left Kidney        +------------------+-----+------------------++ RAR                    RAR                +------------------+-----+------------------++ RAR (manual)      1.42 RAR (manual)       +------------------+-----+------------------++ Cortex                 Cortex             +------------------+-----+------------------++ Cortex thickness       Corex thickness    +------------------+-----+------------------++ Kidney length (cm)12.33Kidney length (cm) +------------------+-----+------------------++    Summary: Largest Aortic Diameter: 1.4 cm   Renal:   Right: Normal size right kidney. 1-59% stenosis of the right renal        artery. Normal right Resisitive Index. RRV flow present. Left:  Lt Kidney Congenital Absence.   *See table(s) above for measurements and observations.   Diagnosing physician: Selinda Gu MD   Electronically signed by Selinda Gu MD on 10/08/2023 at 10:44:34 AM.       Final   VAS US  ABI WITH/WO TBI  LOWER EXTREMITY DOPPLER STUDY  Patient Name:  KAYA POTTENGER  Date of Exam:   10/05/2023 Medical Rec #: [id number]       Accession #:    [id number] Date of Birth: 22-Apr-1950      Patient Gender: F Patient Age:   86 years Exam Location:  [facility] Procedure:      VAS US  ABI WITH/WO TBI Referring Phys: Selinda Gu  --------------------------------------------------------------------------------   Indications: Peripheral artery disease, and 01/11/2015 Right EIA stent, Left               CFA,SFA,pop PTA.   Comparison Study: 10/20/2022  Performing Technologist: Leafy Gibes RVS    Examination Guidelines: A complete evaluation includes at minimum, Doppler waveform signals and systolic blood pressure reading at the level of bilateral brachial, anterior tibial, and posterior tibial arteries, when vessel segments are accessible. Bilateral testing is considered an integral part of a complete examination. Photoelectric Plethysmograph (PPG) waveforms and toe systolic pressure readings are included as required and additional duplex testing as needed. Limited examinations for reoccurring indications may be performed as noted.    ABI Findings: +---------+------------------+-----+---------+--------+ Right    Rt Pressure (mmHg)IndexWaveform Comment  +---------+------------------+-----+---------+--------+ Brachial 132                                      +---------+------------------+-----+---------+--------+ ATA      127               0.96 triphasic         +---------+------------------+-----+---------+--------+ PTA      177               1.34 triphasic         +---------+------------------+-----+---------+--------+ Great Toe102               0.77 Normal            +---------+------------------+-----+---------+--------+  +---------+------------------+-----+---------+-------+ Left     Lt Pressure (mmHg)IndexWaveform Comment +---------+------------------+-----+---------+-------+ Brachial  122                                     +---------+------------------+-----+---------+-------+ ATA      116               0.88 triphasic        +---------+------------------+-----+---------+-------+ PTA      144               1.09 triphasic        +---------+------------------+-----+---------+-------+ Great Toe106               0.80 Normal            +---------+------------------+-----+---------+-------+  +-------+-----------+-----------+------------+------------+ ABI/TBIToday's ABIToday's TBIPrevious ABIPrevious TBI +-------+-----------+-----------+------------+------------+ Right  1.34       .77        1.18        .83          +-------+-----------+-----------+------------+------------+ Left   1.09       .80        1.21        .83          +-------+-----------+-----------+------------+------------+    Bilateral ABIs appear essentially unchanged compared to prior study on 10/20/2022. Left TBIs appear essentially unchanged compared to prior study on 10/20/2022. Right TBIs appear to be decreased compared to prior study on 10/20/2022.   Summary: Right: Resting right ankle-brachial index is within normal range. The right toe-brachial index is normal.  Left: Resting left ankle-brachial index is within normal range. The left toe-brachial index is normal.  *See table(s) above for measurements and observations.    Electronically signed by Selinda Gu MD on 10/08/2023 at 10:43:38 AM.      Final   Note: Reviewed        Physical Exam  Vitals: BP (!) 180/69 (Cuff Size: Normal)   Pulse 62   Temp (!) 97.3 F (36.3 C) (Temporal)   Resp 14   Ht 5' 2 (1.575 m)   Wt 166 lb (75.3 kg)   SpO2 100%   BMI 30.36 kg/m  BMI: Estimated body mass index is 30.36 kg/m as calculated from the following:   Height as of this encounter: 5' 2 (1.575 m).   Weight as of this encounter: 166 lb (75.3 kg). Ideal: Ideal body weight: 50.1 kg (110 lb 7.2 oz) Adjusted ideal body weight: 60.2 kg (132 lb 10.7 oz) General appearance: Well nourished, well developed, and well hydrated. In no apparent acute distress Mental status: Alert, oriented x 3 (person, place, & time)       Respiratory: No evidence of acute respiratory distress Eyes: PERLA   Assessment   Diagnosis Status  1. Chronic thoracic back pain (1ry area of Pain) (Intermittent)  (Bilateral)   2. DISH (diffuse idiopathic skeletal hyperostosis)   3. Myofascial pain syndrome of thoracic spine   4. Type 2 diabetes mellitus with stage 3 chronic kidney disease, unspecified whether long term insulin  use, unspecified whether stage 3a or 3b CKD (HCC)   5. Other intervertebral disc degeneration, thoracic region   6. Muscle spasm of back   7. Spasm of thoracic back muscle    Controlled Controlled Controlled   Updated Problems: Problem  Other Intervertebral Disc Degeneration, Thoracic Region  Muscle Spasm of Back  Spasm of Thoracic Back Muscle    Plan of Care  Problem-specific:  Assessment and Plan    Right-sided back  muscle spasm   Recurrent right-sided back muscle spasms began after a physical therapy session with weight exercises, primarily affecting the right side. Symptoms, initially relieved, were exacerbated by carrying heavy groceries. The spasms are acute and linked to physical exertion. Administer Robaxin 200 mg intramuscularly.       Ms. CALDONIA LEAP has a current medication list which includes the following long-term medication(s): furosemide, levothyroxine, lisinopril, metoprolol tartrate, and rosuvastatin.  Pharmacotherapy (Medications Ordered): Meds ordered this encounter  Medications   methocarbamol (ROBAXIN) injection 200 mg   Orders:  Orders Placed This Encounter  Procedures   DG Thoracic Spine W/Swimmers    Patient presents with axial pain with possible radicular component. Please assist us  in identifying specific level(s) and laterality of any additional findings such as: 1. Facet (Zygapophyseal) joint DJD (Hypertrophy, space narrowing, subchondral sclerosis, and/or osteophyte formation) 2. DDD and/or IVDD (Loss of disc height, desiccation, gas patterns, osteophytes, endplate sclerosis, or Black disc disease) 3. Pars defects 4. Spondylolisthesis, spondylosis, and/or spondyloarthropathies (include Degree/Grade of displacement in mm)  (stability) 5. Vertebral body Fractures (acute/chronic) (state percentage of collapse) 6. Demineralization (osteopenia/osteoporotic) 7. Bone pathology 8. Foraminal narrowing  9. Surgical changes    Standing Status:   Future    Expiration Date:   02/07/2024    Scheduling Instructions:     Please make sure that the patient understands that this needs to be done as soon as possible. Never have the patient do the imaging just before the next appointment. Inform patient that having the imaging done within the [facility] will expedite the availability of the results and will provide      imaging availability to the requesting physician. In addition inform the patient that the imaging order has an expiration date and will not be renewed if not done within the active period.    Reason for Exam (SYMPTOM  OR DIAGNOSIS REQUIRED):   Upper/thoracic back pain    Preferred imaging location?:   [facility]    Release to patient:   Immediate    Call Results- Best Contact Number?:   [phone] [place] [facility]   CT THORACIC SPINE WO CONTRAST    Patient presents with axial pain with possible radicular component. Please assist us  in identifying specific level(s) and laterality of any additional findings such as: 1. Facet (Zygapophyseal) joint DJD (Hypertrophy, space narrowing, subchondral sclerosis, and/or osteophyte formation) 2. DDD and/or IVDD (Loss of disc height, desiccation, gas patterns, osteophytes, endplate sclerosis, or Black disc disease) 3. Pars defects 4. Spondylolisthesis, spondylosis, and/or spondyloarthropathies (include Degree/Grade of displacement in mm) (stability) 5. Vertebral body Fractures (acute/chronic) (state percentage of collapse) 6. Demineralization (osteopenia/osteoporotic) 7. Bone pathology 8. Foraminal narrowing  9. Surgical changes 10. Central, Lateral Recess, and/or Foraminal Stenosis (include AP diameter of stenosis  in mm) 11. Surgical changes (hardware type, status, and presence of fibrosis) 12. Modic Type Changes (MRI only) 13. IVDD (Disc bulge, protrusion, herniation, extrusion) (Level, laterality, extent)  Medical necessity: Imaging study necessary to evaluate for possible degenerative disease responsible for neurologic dysfunction that may be caused by compression or inflammation of CNS and to help plan for interventional therapy or surgery, such as decompression of a pinched nerve or spinal fusion.    Standing Status:   Future    Expiration Date:   02/07/2024    Scheduling Instructions:     Please make sure that the patient understands that this needs to be done as soon as possible. Never  have the patient do the imaging just before the next appointment. Inform patient that having the imaging done within the [facility] will expedite the availability of the results and will provide      imaging availability to the requesting physician. In addition inform the patient that the imaging order has an expiration date and will not be renewed if not done within the active period.    Preferred imaging location?:   [facility]    Call Results- Best Contact Number?:   [phone] [place] [facility]    Radiology Contrast Protocol - do NOT remove file path:   \\[email or web address]    Release to patient:   Immediate [1]   Informed Consent Details: Physician/Practitioner Attestation; Transcribe to consent form and obtain patient signature    Do not administer NSAIDs (Toradol, etc.) if patient has an allergy or intolerance to NSAIDs or if patient has CKD (chronic kidney disease or failure). Avoid the use of Muscle Relaxants (orphenedrine/Norflex, etc.) if patient has allergy or intolerance to this medication.    Scheduling Instructions:     Nursing orders: Complete pain questionnaire before administration of therapy. Document location,  laterality, onset, level, trigger, and current medications taken for the pain.    Physician/Practitioner attestation of informed consent for procedure/surgical case:   I, the physician/practitioner, attest that I have discussed with the patient the benefits, risks, side effects, alternatives, likelihood of achieving goals and potential problems during recovery for the procedure that I have provided informed consent.    Procedure:   IM injection of therapeutic substance    Physician/Practitioner performing the procedure:   Raygen Dahm A. Kiev Labrosse, MD    Indication/Reason:   Acute on chronic pain     Interventional Therapies  Risk Factors  Considerations  Medical Comorbidities:  Plavix  Anticoagulation: (Stop: 7 days  Restart: 2 hours)  Hx. DVT  T2NIDDM w/ Stage III CKD  Single Kidney  Renal Artery Stenosis  HTN  DISH  PVD     Planned  Pending:      Under consideration:   Pending   Completed: (Analgesic benefit)1  None at this time   Therapeutic  Palliative (PRN) options:   None established   Completed by other providers:   None reported  1(Analgesic benefit): Expressed in percentage (%). (Local anesthetic[LA] +/- sedation  L.A.Local Anesthetic  Steroid benefit  Ongoing benefit)     Return for Eval-day (M,W), (Face2F), to review of ordered test(s).    Recent Visits Date Type Provider Dept  10/15/23 Office Visit Tanya Glisson, MD [facility]  09/24/23 Office Visit Tanya Glisson, MD [facility]  Showing recent visits within past 90 days and meeting all other requirements Today's Visits Date Type Provider Dept  11/07/23 Office Visit Tanya Glisson, MD [facility]  Showing today's visits and meeting all other requirements Future Appointments Date Type Provider Dept  12/05/23 Appointment Tanya Glisson, MD [facility]  Showing future appointments within next 90 days and meeting all other requirements  I  discussed the assessment and treatment plan with the patient. The patient was provided an opportunity to ask questions and all were answered. The patient agreed with the plan and demonstrated an understanding of the instructions.  Patient advised to call back or seek an in-person evaluation if the symptoms or condition worsens.  Duration of encounter: 45 minutes.  Total time on encounter, as per AMA guidelines included both the face-to-face and non-face-to-face time personally spent by  the physician and/or other qualified health care professional(s) on the day of the encounter (includes time in activities that require the physician or other qualified health care professional and does not include time in activities normally performed by clinical staff). Physician's time may include the following activities when performed: Preparing to see the patient (e.g., pre-charting review of records, searching for previously ordered imaging, lab work, and nerve conduction tests) Review of prior analgesic pharmacotherapies. Reviewing PMP Interpreting ordered tests (e.g., lab work, imaging, nerve conduction tests) Performing post-procedure evaluations, including interpretation of diagnostic procedures Obtaining and/or reviewing separately obtained history Performing a medically appropriate examination and/or evaluation Counseling and educating the patient/family/caregiver Ordering medications, tests, or procedures Referring and communicating with other health care professionals (when not separately reported) Documenting clinical information in the electronic or other health record Independently interpreting results (not separately reported) and communicating results to the patient/ family/caregiver Care coordination (not separately reported)  Note by: Eric DELENA Como, MD (TTS and AI technology used. I apologize for any typographical errors that were not detected and corrected.) Date: 11/07/2023; Time: 9:47  AM

## 2023-11-07 ENCOUNTER — Encounter: Payer: Self-pay | Admitting: Pain Medicine

## 2023-11-07 ENCOUNTER — Ambulatory Visit: Attending: Pain Medicine | Admitting: Pain Medicine

## 2023-11-07 VITALS — BP 180/69 | HR 62 | Temp 97.3°F | Resp 14 | Ht 62.0 in | Wt 166.0 lb

## 2023-11-07 DIAGNOSIS — M4810 Ankylosing hyperostosis [Forestier], site unspecified: Secondary | ICD-10-CM | POA: Insufficient documentation

## 2023-11-07 DIAGNOSIS — M546 Pain in thoracic spine: Secondary | ICD-10-CM | POA: Insufficient documentation

## 2023-11-07 DIAGNOSIS — M5134 Other intervertebral disc degeneration, thoracic region: Secondary | ICD-10-CM | POA: Diagnosis present

## 2023-11-07 DIAGNOSIS — N183 Chronic kidney disease, stage 3 unspecified: Secondary | ICD-10-CM | POA: Insufficient documentation

## 2023-11-07 DIAGNOSIS — M62830 Muscle spasm of back: Secondary | ICD-10-CM | POA: Insufficient documentation

## 2023-11-07 DIAGNOSIS — M7918 Myalgia, other site: Secondary | ICD-10-CM | POA: Diagnosis present

## 2023-11-07 DIAGNOSIS — G8929 Other chronic pain: Secondary | ICD-10-CM | POA: Diagnosis present

## 2023-11-07 DIAGNOSIS — E1122 Type 2 diabetes mellitus with diabetic chronic kidney disease: Secondary | ICD-10-CM | POA: Diagnosis present

## 2023-11-07 MED ORDER — METHOCARBAMOL 1000 MG/10ML IJ SOLN
200.0000 mg | Freq: Once | INTRAMUSCULAR | Status: AC
  Administered 2023-11-07 (×2): 200 mg via INTRAMUSCULAR
  Filled 2023-11-07: qty 10

## 2023-11-07 NOTE — Patient Instructions (Signed)
  ____________________________________________________________________________________________  Muscle Spasms & Cramps  Cause(s):  Most common - vitamin and/or electrolyte (calcium, potassium, sodium, etc.) deficiencies. Post procedure - steroids (injected, oral, or inhaled) can make your kidneys excrete (loose) electrolytes. Most of the time this will not cause any symptoms however, if you happen to be borderline low on your electrolytes, it may temporarily triggering cramps & spasms.  Possible triggers: Sweating - causes loss of electrolytes thru the skin. Steroids - causes loss of electrolytes thru the urine.  Treatment: (over-the-counter)  Gatorade (or any other electrolyte-replenishing drink) - Take 1, 8 oz glass with each meal (3 times a day). Mechanism of action: Replenishes lost electrolytes. Magnesium 400 to 500 mg - Take 1 tablet twice a day (one with breakfast and one at bedtime). If you have kidney disease talk to your primary care physician before taking any Magnesium. Mechanism of action: Magnesium is a natural muscle relaxant. Tonic Water with quinine - Take 1, 8 oz glass before bedtime.  Mechanism of action: Quinine is used to treat spasms.  Last Update: 10/05/2022  ____________________________________________________________________________________________

## 2023-11-09 ENCOUNTER — Ambulatory Visit
Admission: RE | Admit: 2023-11-09 | Discharge: 2023-11-09 | Disposition: A | Discharge status: Home / Self Care | Source: Ambulatory Visit | Attending: Pain Medicine | Admitting: Pain Medicine

## 2023-11-09 DIAGNOSIS — M4810 Ankylosing hyperostosis [Forestier], site unspecified: Secondary | ICD-10-CM | POA: Insufficient documentation

## 2023-11-09 DIAGNOSIS — G8929 Other chronic pain: Secondary | ICD-10-CM | POA: Insufficient documentation

## 2023-11-09 DIAGNOSIS — M546 Pain in thoracic spine: Secondary | ICD-10-CM | POA: Diagnosis present

## 2023-11-09 DIAGNOSIS — M5134 Other intervertebral disc degeneration, thoracic region: Secondary | ICD-10-CM | POA: Diagnosis present

## 2023-11-14 MED ORDER — BACLOFEN 10 MG PO TABS: 5.0000 mg | ORAL_TABLET | Freq: Two times a day (BID) | ORAL | 0 refills | Status: DC | PRN

## 2023-11-28 LAB — HM DIABETES EYE EXAM

## 2023-11-30 ENCOUNTER — Ambulatory Visit: Payer: Self-pay

## 2023-11-30 NOTE — Progress Notes (Signed)
 Noted.  Jacklin Mascot, MD

## 2023-12-04 DIAGNOSIS — Z7901 Long term (current) use of anticoagulants: Secondary | ICD-10-CM | POA: Insufficient documentation

## 2023-12-04 DIAGNOSIS — M5459 Other low back pain: Secondary | ICD-10-CM | POA: Insufficient documentation

## 2023-12-04 DIAGNOSIS — M47894 Other spondylosis, thoracic region: Secondary | ICD-10-CM | POA: Insufficient documentation

## 2023-12-04 DIAGNOSIS — M545 Low back pain, unspecified: Secondary | ICD-10-CM | POA: Insufficient documentation

## 2023-12-04 DIAGNOSIS — M546 Pain in thoracic spine: Secondary | ICD-10-CM | POA: Insufficient documentation

## 2023-12-04 DIAGNOSIS — M47814 Spondylosis without myelopathy or radiculopathy, thoracic region: Secondary | ICD-10-CM | POA: Insufficient documentation

## 2023-12-04 NOTE — Progress Notes (Unsigned)
 PROVIDER NOTE: Interpretation of information contained herein should be left to medically-trained personnel. Specific patient instructions are provided elsewhere under Patient Instructions section of medical record. This document was created in part using AI and STT-dictation technology, any transcriptional errors that may result from this process are unintentional.  Patient: Danielle Park  Service: E/M   PCP: Abbey Bruckner, MD  DOB: 06/26/1950  DOS: 12/05/2023  Provider: Eric DELENA Como, MD  MRN: [id number]  Delivery: Face-to-face  Specialty: Interventional Pain Management  Type: Established Patient  Setting: Ambulatory outpatient facility  Specialty designation: 09  Referring Prov.: Bair, Kalpana, MD  Location: Outpatient office facility       History of present illness (HPI) [name], a 73 y.o. year old female, is here today because of her DISH (diffuse idiopathic skeletal hyperostosis) [M48.10]. Ms. Bohle primary complain today is No chief complaint on file.  Pertinent problems: [name] has Chronic thoracic back pain (1ry area of Pain) (Intermittent) (Bilateral); Low back pain; Chronic pain syndrome; Myofascial pain syndrome of thoracic spine; Back spasm; DISH (diffuse idiopathic skeletal hyperostosis); Other intervertebral disc degeneration, thoracic region; Spasm of thoracic back muscle; Thoracic facet arthropathy (Multilevel) (Bilateral); Thoracic facet joint pain; Intractable low back pain; Multifactorial low back pain; Thoracolumbar back pain; and Thoracic facet syndrome on their pertinent problem list.  Pain Assessment: Severity of   is reported as a  /10. Location:    / . Onset:  . Quality:  . Timing:  . Modifying factor(s):  SABRA Vitals:  vitals were not taken for this visit.  BMI: Estimated body mass index is 30.36 kg/m as calculated from the following:   Height as of 11/07/23: 5' 2 (1.575 m).   Weight as of 11/07/23: 166 lb (75.3 kg).  Last encounter: 11/07/2023. Last  procedure: Visit date not found.  Reason for encounter: follow-up evaluation.   Discussed the use of AI scribe software for clinical note transcription with the patient, who gave verbal consent to proceed.  History of Present Illness           Pharmacotherapy Assessment   Opioid Analgesic: None MME/day: 0 mg/day   Monitoring: [place] PMP: PDMP not reviewed this encounter.       Pharmacotherapy: No side-effects or adverse reactions reported. Compliance: No problems identified. Effectiveness: Clinically acceptable.  No notes on file  UDS:  No results found for: SUMMARY  No results found for: CBDTHCR No results found for: D8THCCBX No results found for: D9THCCBX  ROS  Constitutional: Denies any fever or chills Gastrointestinal: No reported hemesis, hematochezia, vomiting, or acute GI distress Musculoskeletal: Denies any acute onset joint swelling, redness, loss of ROM, or weakness Neurological: No reported episodes of acute onset apraxia, aphasia, dysarthria, agnosia, amnesia, paralysis, loss of coordination, or loss of consciousness  Medication Review  Dulaglutide , aspirin, baclofen , cholecalciferol, clopidogrel , dapagliflozin  propanediol, furosemide , levothyroxine , lisinopril , metoprolol  tartrate, rosuvastatin , and traZODone   History Review  Allergy: [name] has no known allergies. Drug: [name]  reports no history of drug use. Alcohol:  reports no history of alcohol use. Tobacco:  reports that she has never smoked. She has never used smokeless tobacco. Social: [name]  reports that she has never smoked. She has never used smokeless tobacco. She reports that she does not drink alcohol and does not use drugs. Medical:  has a past medical history of Chicken pox, Congenital doubling of uterus, Congenital single kidney, Diabetes mellitus, Hypertension, Right ankle pain (04/18/2016), and UTI (urinary tract infection). Surgical: Ms.  Morss  has a past surgical history  that includes Cesarean section; Vaginal delivery; Renal artery stent (2014); Cardiac catheterization (N/A, 01/11/2015); and Cardiac catheterization (01/11/2015). Family: family history includes Cancer in her father and sister; Cancer - Lung in her brother; Diabetes in her mother, sister, and another family member; Heart disease in her mother, sister, sister, and another family member; Hypertension in her father and mother.  Laboratory Chemistry Profile   Renal Lab Results  Component Value Date   BUN 17 09/24/2023   CREATININE 1.26 (H) 09/24/2023   BCR 13 09/24/2023   GFR 39.92 (L) 09/19/2023   GFRAA >60 03/06/2015   GFRNONAA >60 03/06/2015    Hepatic Lab Results  Component Value Date   AST 20 09/24/2023   ALT 7 09/19/2023   ALBUMIN 4.2 09/24/2023   ALKPHOS 105 09/24/2023   LIPASE 42 03/06/2015    Electrolytes Lab Results  Component Value Date   NA 142 09/24/2023   K 4.6 09/24/2023   CL 104 09/24/2023   CALCIUM  9.8 09/24/2023   MG 2.4 (H) 09/24/2023   PHOS 3.5 03/29/2020    Bone Lab Results  Component Value Date   VD25OH 37.40 12/09/2018   25OHVITD1 40 09/24/2023   25OHVITD2 2.0 09/24/2023   25OHVITD3 38 09/24/2023    Inflammation (CRP: Acute Phase) (ESR: Chronic Phase) Lab Results  Component Value Date   CRP <1 09/24/2023   ESRSEDRATE 12 09/24/2023         Note: Above Lab results reviewed.  Recent Imaging Review  DG Thoracic Spine W/Swimmers EXAM: 3 VIEW(S) XRAY OF THE THORACIC SPINE 11/09/2023 07:55:00 AM  COMPARISON: Thoracic spine CT 11/09/2023 and radiographs 07/17/2016.  CLINICAL HISTORY: Upper/thoracic back pain. Pt reports muscle spasms x 2 weeks.  FINDINGS:  BONES: No acute fracture. No aggressive appearing osseous lesion. Mild chronic thoracic dextroscoliosis, similar to the prior radiographs. No significant listhesis is evident.  DISCS AND DEGENERATIVE CHANGES: Bridging vertebral osteophytes are again seen throughout the mid and  lower thoracic spine and have likely mildly progressed from 2018. Generalized disc space narrowing is overall mild.  SOFT TISSUES: The included portions of the lungs are grossly clear. Aortic atherosclerosis is noted.  IMPRESSION: 1. No acute abnormality of the thoracic spine. 2. Diffuse idiopathic skeletal hyperostosis (DISH).  Electronically signed by: Dasie Hamburg MD 11/22/2023 01:32 PM EDT RP Workstation: [id number] CT THORACIC SPINE WO CONTRAST EXAM: CT THORACIC SPINE WITHOUT CONTRAST 11/09/2023 08:00:13 AM  TECHNIQUE: CT of the thoracic spine was performed without the administration of intravenous contrast. Multiplanar reformatted images are provided for review. Automated exposure control, iterative reconstruction, and/or weight based adjustment of the mA/kV was utilized to reduce the radiation dose to as low as reasonably achievable.  COMPARISON: None available.  CLINICAL HISTORY: Mid-back pain, Osteoarthritis, thoracic, Chronic bilateral thoracic back pain, DISH (diffuse idiopathic skeletal hyperostosis).  FINDINGS:  BONES AND ALIGNMENT: Mild thoracic dextroscoliosis. No significant listhesis. No acute fracture or suspicious lesion.  DEGENERATIVE CHANGES: Diffuse thoracic spondylosis. Solid bridging anterolateral vertebral osteophytes from T5 to L1 compatible with diffuse idiopathic skeletal hyperostosis. Partial fusion across the more central portions of the disc spaces from T8 to T11 as well. Mild upper thoracic facet arthrosis, most notable on the right at T2-3, T3-4, and T4-5. Asymmetric left facet spurring and ligamentous calcification at T12. No evidence of high-grade spinal canal or neural foraminal stenosis.  SOFT TISSUES: No acute abnormality. Aortic atherosclerosis.  IMPRESSION: 1. No acute osseous abnormality of the thoracic spine. 2. Diffuse idiopathic skeletal  hyperostosis (DISH). 3. No evidence of high-grade spinal canal or neural foraminal  stenosis.  Electronically signed by: Dasie Hamburg MD 11/22/2023 01:28 PM EDT RP Workstation: [id number] Note: Reviewed        Physical Exam  Vitals: There were no vitals taken for this visit. BMI: Estimated body mass index is 30.36 kg/m as calculated from the following:   Height as of 11/07/23: 5' 2 (1.575 m).   Weight as of 11/07/23: 166 lb (75.3 kg). Ideal: Patient weight not recorded General appearance: Well nourished, well developed, and well hydrated. In no apparent acute distress Mental status: Alert, oriented x 3 (person, place, & time)       Respiratory: No evidence of acute respiratory distress Eyes: PERLA   Assessment   Diagnosis Status  1. DISH (diffuse idiopathic skeletal hyperostosis)   2. Chronic thoracic back pain (1ry area of Pain) (Intermittent) (Bilateral)   3. Thoracic facet arthropathy   4. Thoracic facet joint pain   5. Type 2 diabetes mellitus with stage 3 chronic kidney disease, unspecified whether long term insulin  use, unspecified whether stage 3a or 3b CKD (HCC)   6. Intractable low back pain   7. Multifactorial low back pain   8. Thoracolumbar back pain   9. Thoracic facet syndrome   10. Chronic anticoagulation (Plavix )    Controlled Controlled Controlled   Updated Problems: Problem  Thoracic facet arthropathy (Multilevel) (Bilateral)   Upper thoracic facet arthrosis, most notable on the right at T2-3, T3-4, and T4-5.    Thoracic facet joint pain  Intractable Low Back Pain  Multifactorial Low Back Pain  Thoracolumbar Back Pain  Thoracic Facet Syndrome  Spasm of Thoracic Back Muscle  Chronic anticoagulation (Plavix )   Plavix  Anticoagulation: (Stop: 7 days  Restart: 2 hours)      Plan of Care  Problem-specific:  Assessment and Plan            Ms. AMAYRA KIEDROWSKI has a current medication list which includes the following long-term medication(s): furosemide , levothyroxine , lisinopril , metoprolol  tartrate, and  rosuvastatin .  Pharmacotherapy (Medications Ordered): No orders of the defined types were placed in this encounter.  Orders:  No orders of the defined types were placed in this encounter.    Interventional Therapies  Risk Factors  Considerations  Medical Comorbidities:  Plavix  Anticoagulation: (Stop: 7 days  Restart: 2 hours)  Hx. DVT  T2NIDDM w/ Stage III CKD  Single Kidney  Renal Artery Stenosis  HTN  DISH  PVD     Planned  Pending:   Diagnostic/therapeutic bilateral thoracic facet (T2-3, T3-4, and T4-5) MBB #1    Under consideration:   Diagnostic/therapeutic bilateral thoracic facet (T2-3, T3-4, and T4-5) MBB #1    Completed: (Analgesic benefit)1  None at this time   Therapeutic  Palliative (PRN) options:   None established   Completed by other providers:   None reported  1(Analgesic benefit): Expressed in percentage (%). (Local anesthetic[LA] +/- sedation  L.A.Local Anesthetic  Steroid benefit  Ongoing benefit)     No follow-ups on file.    Recent Visits Date Type Provider Dept  11/07/23 Office Visit Tanya Glisson, MD [facility]  10/15/23 Office Visit Tanya Glisson, MD [facility]  09/24/23 Office Visit Tanya Glisson, MD [facility]  Showing recent visits within past 90 days and meeting all other requirements Future Appointments Date Type Provider Dept  12/05/23 Appointment Tanya Glisson, MD [facility]  Showing future appointments within next 90 days and meeting all  other requirements  I discussed the assessment and treatment plan with the patient. The patient was provided an opportunity to ask questions and all were answered. The patient agreed with the plan and demonstrated an understanding of the instructions.  Patient advised to call back or seek an in-person evaluation if the symptoms or condition worsens.  Duration of encounter: *** minutes.  Total time on encounter, as per AMA  guidelines included both the face-to-face and non-face-to-face time personally spent by the physician and/or other qualified health care professional(s) on the day of the encounter (includes time in activities that require the physician or other qualified health care professional and does not include time in activities normally performed by clinical staff). Physician's time may include the following activities when performed: Preparing to see the patient (e.g., pre-charting review of records, searching for previously ordered imaging, lab work, and nerve conduction tests) Review of prior analgesic pharmacotherapies. Reviewing PMP Interpreting ordered tests (e.g., lab work, imaging, nerve conduction tests) Performing post-procedure evaluations, including interpretation of diagnostic procedures Obtaining and/or reviewing separately obtained history Performing a medically appropriate examination and/or evaluation Counseling and educating the patient/family/caregiver Ordering medications, tests, or procedures Referring and communicating with other health care professionals (when not separately reported) Documenting clinical information in the electronic or other health record Independently interpreting results (not separately reported) and communicating results to the patient/ family/caregiver Care coordination (not separately reported)  Note by: Eric DELENA Como, MD (TTS and AI technology used. I apologize for any typographical errors that were not detected and corrected.) Date: 12/05/2023; Time: 6:30 AM

## 2023-12-04 NOTE — Patient Instructions (Incomplete)
 ______________________________________________________________________    Diffuse Idiopathic Skeletal Hyperostosis & You  What is it? Diffuse idiopathic skeletal hyperostosis (also known as "DISH") is a disease that results in bone growth along the ligaments of the spine. For some reason, bone-building cells lay down calcium  in areas where they shouldn't. Calcification or excess bone growth can occur throughout the body wherever tendons and ligaments insert into the bone. This process can cause pain and stiffness. The exact cause of DISH is uncertain. It's a rare disease, but it can run in families. The most likely scenario is that both genetics and the environment play a role in causing DISH. DISH is very rare in people under the age of 37. It most commonly occurs in older men.  DISH can feel like Medusa slowly turning you to stone In [place] mythology, Medusa was a beautiful goddess whose silky hair turned into snakes. She was feared for her ability to turn men into stone when they looked into her eyes. If you have DISH, you might feel a little bit like you're looking into the eyes of Medusa. Over time, the ligaments of the spine and sometimes the shoulders, elbows and knees become calcified where they insert into bone, causing stiffness and pain that can make it difficult to move around.    What is it going to do to me? Pain and stiffness are the most common symptoms of DISH. It can take a long time for DISH to develop, and some people may live with the stiffness for many years before being diagnosed. The pain from DISH can eventually be extreme. Pain usually occurs along the spine, anywhere from the neck down to the lower back. Pain is sometimes worse in the morning, but it usually doesn't go away throughout the day. If the neck is involved, bone growth can cause pressure on other structures that sometimes makes it difficult to swallow or breathe. Pain and tenderness can also occur throughout other  parts of the body, such as in the Achilles tendon, the feet, the knee cap or the shoulder joint. Patients with DISH may experience fatigue, difficulty sleeping and sometimes depression.  What can I do about it? If you have DISH or think you may have it, you should see your family doctor. In some cases, your family doctor might refer you to a rheumatologist, an expert in treating arthritis and inflammatory disorders. People with DISH can lead active and productive lives with the right kind of treatment. The goal is to help keep the joints moving smoothly to decrease joint pain and prevent limitations in mobility and functioning. Medications for DISH are usually geared towards pain control. Non-prescription pain relievers such as acetaminophen (Tylenol) are often the first treatment approach. Non-steroidal anti-inflammatory drugs (NSAIDs) can also be used to help relieve pain and stiffness. When over-the-counter medications are not enough to control pain caused by DISH, stronger medicines called opioids may be used. Physical therapy, exercise and moderate stretching are an important part of an overall treatment plan for DISH. They can improve the pain and stiffness in your joints and help protect them by strengthening the surrounding muscles. Exercise can also reduce fatigue. Here are some other recommendations:  Learn as much as you can about the disease  Attend your medical appointments regularly  Learn to protect your joints from further damage  Learn about the medications used to treat DISH  Visit [employer] is a free educational website where you can learn more about DISH and treatments for the disease. The  website is operated by Dr. Jodie Hummer, a rheumatologist.    (Last update: 11/02/2022) ______________________________________________________________________     ______________________________________________________________________    Procedure instructions  Stop  blood-thinners  Do not eat or drink fluids (other than water) for 6 hours before your procedure  No water for 2 hours before your procedure  Take your blood pressure medicine with a sip of water  Arrive 30 minutes before your appointment  If sedation is planned, bring suitable driver. [place], [place], & public transportation are NOT APPROVED)  Carefully read the Preparing for your procedure detailed instructions  If you have questions call us  at [phone]  Procedure appointments are for procedures only.   NO medication refills or new problem evaluations will be done on procedure days.   Only the scheduled, pre-approved procedure and side will be done.   ______________________________________________________________________     ______________________________________________________________________    Preparing for your procedure  Appointments: If you think you may not be able to keep your appointment, call 24-48 hours in advance to cancel. We need time to make it available to others.  Procedure visits are for procedures only. During your procedure appointment there will be: NO Prescription Refills*. NO medication changes or discussions*. NO discussion of disability issues*. NO unrelated pain problem evaluations*. NO evaluations to order other pain procedures*. *These will be addressed at a separate and distinct evaluation encounter on the provider's evaluation schedule and not during procedure days.  Instructions: Food intake: Avoid eating anything solid for at least 8 hours prior to your procedure. Clear liquid intake: You may take clear liquids such as water up to 2 hours prior to your procedure. (No carbonated drinks. No soda.) Transportation: Unless otherwise stated by your physician, bring a driver. (Driver cannot be a Market researcher, Pharmacist, community, or any other form of public transportation.) Morning Medicines: Except for blood thinners, take all of your other morning medications  with a sip of water. Make sure to take your heart and blood pressure medicines. If your blood pressure's lower number is above 100, the case will be rescheduled. Blood thinners: Make sure to stop your blood thinners as instructed.  If you take a blood thinner, but were not instructed to stop it, call our office [phone] and ask to talk to a nurse. Not stopping a blood thinner prior to certain procedures could lead to serious complications. Diabetics on insulin : Notify the staff so that you can be scheduled 1st case in the morning. If your diabetes requires high dose insulin , take only  of your normal insulin  dose the morning of the procedure and notify the staff that you have done so. Preventing infections: Shower with an antibacterial soap the morning of your procedure.  Build-up your immune system: Take 1000 mg of Vitamin C with every meal (3 times a day) the day prior to your procedure. Antibiotics: Inform the nursing staff if you are taking any antibiotics or if you have any conditions that may require antibiotics prior to procedures. (Example: recent joint implants)   Pregnancy: If you are pregnant make sure to notify the nursing staff. Not doing so may result in injury to the fetus, including death.  Sickness: If you have a cold, fever, or any active infections, call and cancel or reschedule your procedure. Receiving steroids while having an infection may result in complications. Arrival: You must be in the facility at least 30 minutes prior to your scheduled procedure. Tardiness: Your scheduled time is also the cutoff time. If you do not  arrive at least 15 minutes prior to your procedure, you will be rescheduled.  Children: Do not bring any children with you. Make arrangements to keep them home. Dress appropriately: There is always a possibility that your clothing may get soiled. Avoid long dresses. Valuables: Do not bring any jewelry or valuables.  Reasons to call and reschedule or  cancel your procedure: (Following these recommendations will minimize the risk of a serious complication.) Surgeries: Avoid having procedures within 2 weeks of any surgery. (Avoid for 2 weeks before or after any surgery). Flu Shots: Avoid having procedures within 2 weeks of a flu shots or . (Avoid for 2 weeks before or after immunizations). Barium: Avoid having a procedure within 7-10 days after having had a radiological study involving the use of radiological contrast. (Myelograms, Barium swallow or enema study). Heart attacks: Avoid any elective procedures or surgeries for the initial 6 months after a Myocardial Infarction (Heart Attack). Blood thinners: It is imperative that you stop these medications before procedures. Let us  know if you if you take any blood thinner.  Infection: Avoid procedures during or within two weeks of an infection (including chest colds or gastrointestinal problems). Symptoms associated with infections include: Localized redness, fever, chills, night sweats or profuse sweating, burning sensation when voiding, cough, congestion, stuffiness, runny nose, sore throat, diarrhea, nausea, vomiting, cold or Flu symptoms, recent or current infections. It is specially important if the infection is over the area that we intend to treat. Heart and lung problems: Symptoms that may suggest an active cardiopulmonary problem include: cough, chest pain, breathing difficulties or shortness of breath, dizziness, ankle swelling, uncontrolled high or unusually low blood pressure, and/or palpitations. If you are experiencing any of these symptoms, cancel your procedure and contact your primary care physician for an evaluation.  Remember:  Regular Business hours are:  Monday to Thursday 8:00 AM to 4:00 PM  Provider's Schedule: Eric Como, MD:  Procedure days: Tuesday and Thursday 7:30 AM to 4:00 PM  Wallie Sherry, MD:  Procedure days: Monday and Wednesday 7:30 AM to 4:00 PM Last   Updated: 03/06/2023 ______________________________________________________________________     ______________________________________________________________________    General Risks and Possible Complications  Patient Responsibilities: It is important that you read this as it is part of your informed consent. It is our duty to inform you of the risks and possible complications associated with treatments offered to you. It is your responsibility as a patient to read this and to ask questions about anything that is not clear or that you believe was not covered in this document.  Patient's Rights: You have the right to refuse treatment. You also have the right to change your mind, even after initially having agreed to have the treatment done. However, under this last option, if you wait until the last second to change your mind, you may be charged for the materials used up to that point.  Introduction: Medicine is not an Visual merchandiser. Everything in Medicine, including the lack of treatment(s), carries the potential for danger, harm, or loss (which is by definition: Risk). In Medicine, a complication is a secondary problem, condition, or disease that can aggravate an already existing one. All treatments carry the risk of possible complications. The fact that a side effects or complications occurs, does not imply that the treatment was conducted incorrectly. It must be clearly understood that these can happen even when everything is done following the highest safety standards.  No treatment: You can choose not to proceed with  the proposed treatment alternative. The "PRO(s)" would include: avoiding the risk of complications associated with the therapy. The "CON(s)" would include: not getting any of the treatment benefits. These benefits fall under one of three categories: diagnostic; therapeutic; and/or palliative. Diagnostic benefits include: getting information which can ultimately lead to improvement of  the disease or symptom(s). Therapeutic benefits are those associated with the successful treatment of the disease. Finally, palliative benefits are those related to the decrease of the primary symptoms, without necessarily curing the condition (example: decreasing the pain from a flare-up of a chronic condition, such as incurable terminal cancer).  General Risks and Complications: These are associated to most interventional treatments. They can occur alone, or in combination. They fall under one of the following six (6) categories: no benefit or worsening of symptoms; bleeding; infection; nerve damage; allergic reactions; and/or death. No benefits or worsening of symptoms: In Medicine there are no guarantees, only probabilities. No healthcare provider can ever guarantee that a medical treatment will work, they can only state the probability that it may. Furthermore, there is always the possibility that the condition may worsen, either directly, or indirectly, as a consequence of the treatment. Bleeding: This is more common if the patient is taking a blood thinner, either prescription or over the counter (example: Goody Powders, Fish oil, Aspirin, Garlic, etc.), or if suffering a condition associated with impaired coagulation (example: Hemophilia, cirrhosis of the liver, low platelet counts, etc.). However, even if you do not have one on these, it can still happen. If you have any of these conditions, or take one of these drugs, make sure to notify your treating physician. Infection: This is more common in patients with a compromised immune system, either due to disease (example: diabetes, cancer, human immunodeficiency virus [HIV], etc.), or due to medications or treatments (example: therapies used to treat cancer and rheumatological diseases). However, even if you do not have one on these, it can still happen. If you have any of these conditions, or take one of these drugs, make sure to notify your treating  physician. Nerve Damage: This is more common when the treatment is an invasive one, but it can also happen with the use of medications, such as those used in the treatment of cancer. The damage can occur to small secondary nerves, or to large primary ones, such as those in the spinal cord and brain. This damage may be temporary or permanent and it may lead to impairments that can range from temporary numbness to permanent paralysis and/or brain death. Allergic Reactions: Any time a substance or material comes in contact with our body, there is the possibility of an allergic reaction. These can range from a mild skin rash (contact dermatitis) to a severe systemic reaction (anaphylactic reaction), which can result in death. Death: In general, any medical intervention can result in death, most of the time due to an unforeseen complication. ______________________________________________________________________      ______________________________________________________________________    Blood Thinners  IMPORTANT NOTICE:  If you take any of these, make sure to notify the nursing staff.  Failure to do so may result in serious injury.  Recommended time intervals to stop and restart blood-thinners, before & after invasive procedures  Generic Name Brand Name Pre-procedure: Stop medication for this amount of time before your procedure: Post-procedure: Wait this amount of time after the procedure before restarting your medication:  Abciximab Reopro 15 days 2 hrs  Alteplase Activase 10 days 10 days  Anagrelide Agrylin  Apixaban Eliquis 3 days 6 hrs  Cilostazol Pletal 3 days 5 hrs  Clopidogrel  Plavix  7-10 days 2 hrs  Dabigatran Pradaxa 5 days 6 hrs  Dalteparin Fragmin 24 hours 4 hrs  Dipyridamole Aggrenox 11days 2 hrs  Edoxaban Lixiana; Savaysa 3 days 2 hrs  Enoxaparin  Lovenox 24 hours 4 hrs  Eptifibatide Integrillin 8 hours 2 hrs  Fondaparinux  Arixtra 72 hours 12 hrs  Hydroxychloroquine  Plaquenil 11 days   Prasugrel Effient 7-10 days 6 hrs  Reteplase Retavase 10 days 10 days  Rivaroxaban Xarelto 3 days 6 hrs  Ticagrelor Brilinta 5-7 days 6 hrs  Ticlopidine Ticlid 10-14 days 2 hrs  Tinzaparin Innohep 24 hours 4 hrs  Tirofiban Aggrastat 8 hours 2 hrs  Warfarin Coumadin 5 days 2 hrs   Other medications with blood-thinning effects  NOTE: Consider stopping these if you have prolonged bleeding despite not taking any of the above blood thinners. Otherwise ask your provider and this will be decided on a case-by-case basis.  Product indications Generic (Brand) names Note  Cholesterol Lipitor Stop 4 days before procedure  Blood thinner (injectable) Heparin  (LMW or LMWH Heparin ) Stop 24 hours before procedure  Cancer Ibrutinib (Imbruvica) Stop 7 days before procedure  Malaria/Rheumatoid Hydroxychloroquine (Plaquenil) Stop 11 days before procedure  Thrombolytics  10 days before or after procedures   Over-the-counter (OTC) Products with blood-thinning effects  NOTE: Consider stopping these if you have prolonged bleeding despite not taking any of the above blood thinners. Otherwise ask your provider and this will be decided on a case-by-case basis.  Product Common names Stop Time  Aspirin > 325 mg Goody Powders, Excedrin, etc. 11 days  Aspirin <= 81 mg  7 days  Fish oil  4 days  Garlic supplements  7 days  Ginkgo biloba  36 hours  Ginseng  24 hours  NSAIDs Ibuprofen, Naprosyn, etc. 3 days  Vitamin E  4 days   ______________________________________________________________________     ______________________________________________________________________    Steroid injections  Common steroids for injections Triamcinolone: Used by many sports medicine physicians for large joint and bursal injections, often combined with a local anesthetic like lidocaine . A study focusing on coccydynia (tailbone pain) found triamcinolone was more effective than betamethasone, suggesting it may  also be preferable for other localized inflammation conditions. Methylprednisolone: A common alternative to triamcinolone that is also a strong anti-inflammatory. It is available in different formulations, with the acetate suspension being the long-acting option for intra-articular injections. Dexamethasone: This is a non-particulate steroid, meaning it has a lower risk of tissue damage compared to particulate steroids like triamcinolone and methylprednisolone. While less common for this specific use, it is an option for targeted injections.   Considerations for physicians Particulate vs. non-particulate steroids: Triamcinolone and methylprednisolone are particulate, meaning they can clump together. Dexamethasone is non-particulate. Particulate steroids are often preferred for their longer-lasting effects but carry a theoretical higher risk for certain injections (though this is less of a concern in the costochondral joints). Combined injectate: Corticosteroids are typically mixed with a local anesthetic like lidocaine  to provide both immediate pain relief (from the anesthetic) and longer-term inflammation reduction (from the steroid). Imaging guidance: To ensure accurate placement of the needle and medication, physicians may use ultrasound or fluoroscopic guidance for the injection, especially in complex or refractory cases.   Patient guidance Before undergoing a steroid injection, discuss the options with your physician. They will determine the best steroid, dosage, and procedure for your specific case based on factors like: Severity  of your condition History of response to other treatments Your overall health status Experience and preference of the physician  Last  Updated: 11/20/2023 ______________________________________________________________________

## 2023-12-05 ENCOUNTER — Encounter: Payer: Self-pay | Admitting: Pain Medicine

## 2023-12-05 ENCOUNTER — Ambulatory Visit: Attending: Pain Medicine | Admitting: Pain Medicine

## 2023-12-05 ENCOUNTER — Telehealth: Payer: Self-pay

## 2023-12-05 VITALS — BP 172/73 | HR 65 | Temp 97.3°F | Resp 16 | Ht 62.0 in | Wt 166.0 lb

## 2023-12-05 DIAGNOSIS — M47894 Other spondylosis, thoracic region: Secondary | ICD-10-CM | POA: Diagnosis present

## 2023-12-05 DIAGNOSIS — N183 Chronic kidney disease, stage 3 unspecified: Secondary | ICD-10-CM | POA: Diagnosis present

## 2023-12-05 DIAGNOSIS — M545 Low back pain, unspecified: Secondary | ICD-10-CM | POA: Diagnosis not present

## 2023-12-05 DIAGNOSIS — Z7901 Long term (current) use of anticoagulants: Secondary | ICD-10-CM | POA: Diagnosis present

## 2023-12-05 DIAGNOSIS — M546 Pain in thoracic spine: Secondary | ICD-10-CM | POA: Insufficient documentation

## 2023-12-05 DIAGNOSIS — G8929 Other chronic pain: Secondary | ICD-10-CM | POA: Insufficient documentation

## 2023-12-05 DIAGNOSIS — M5459 Other low back pain: Secondary | ICD-10-CM | POA: Insufficient documentation

## 2023-12-05 DIAGNOSIS — M4810 Ankylosing hyperostosis [Forestier], site unspecified: Secondary | ICD-10-CM | POA: Insufficient documentation

## 2023-12-05 DIAGNOSIS — E1122 Type 2 diabetes mellitus with diabetic chronic kidney disease: Secondary | ICD-10-CM | POA: Diagnosis present

## 2023-12-05 DIAGNOSIS — M47814 Spondylosis without myelopathy or radiculopathy, thoracic region: Secondary | ICD-10-CM | POA: Diagnosis not present

## 2023-12-05 NOTE — Progress Notes (Signed)
 Safety precautions to be maintained throughout the outpatient stay will include: orient to surroundings, keep bed in low position, maintain call bell within reach at all times, provide assistance with transfer out of bed and ambulation.

## 2023-12-05 NOTE — Telephone Encounter (Signed)
 Dr Tanya would like clearanace to stop Plavix  for 7 days for a Thoracic Epidural steroid injection.  Thank you

## 2023-12-06 ENCOUNTER — Other Ambulatory Visit: Payer: Self-pay

## 2023-12-06 MED ORDER — TRAZODONE HCL 50 MG PO TABS: 25.0000 mg | ORAL_TABLET | Freq: Every evening | ORAL | 1 refills | Status: AC | PRN

## 2023-12-08 ENCOUNTER — Other Ambulatory Visit: Payer: Self-pay

## 2023-12-08 DIAGNOSIS — M62830 Muscle spasm of back: Secondary | ICD-10-CM

## 2023-12-10 NOTE — Telephone Encounter (Signed)
 1. Back spasm (Primary) - baclofen  (LIORESAL ) 10 MG tablet; Take 0.5 tablets (5 mg total) by mouth 2 (two) times daily as needed for muscle spasms.  Dispense: 90 tablet; Refill: 0 Luke Shade, MD

## 2024-01-08 ENCOUNTER — Telehealth: Payer: Self-pay

## 2024-01-08 NOTE — Telephone Encounter (Signed)
 PAP: Patient assistance application for Farxiga through [employer] ([employer]) has been mailed to pt's home address on file. Provider portion of application will be faxed to provider's office.

## 2024-01-11 NOTE — Telephone Encounter (Signed)
 Received provider portion PAP application [employer] (Farxiga )

## 2024-01-15 ENCOUNTER — Ambulatory Visit (HOSPITAL_BASED_OUTPATIENT_CLINIC_OR_DEPARTMENT_OTHER): Admitting: Pain Medicine

## 2024-01-15 ENCOUNTER — Encounter: Payer: Self-pay | Admitting: Pain Medicine

## 2024-01-15 ENCOUNTER — Ambulatory Visit
Admission: RE | Admit: 2024-01-15 | Discharge: 2024-01-15 | Disposition: A | Discharge status: Home / Self Care | Source: Ambulatory Visit | Attending: Pain Medicine | Admitting: Pain Medicine

## 2024-01-15 VITALS — BP 138/62 | HR 62 | Temp 97.6°F | Resp 16 | Ht 62.0 in | Wt 160.0 lb

## 2024-01-15 DIAGNOSIS — M47814 Spondylosis without myelopathy or radiculopathy, thoracic region: Secondary | ICD-10-CM | POA: Insufficient documentation

## 2024-01-15 DIAGNOSIS — M546 Pain in thoracic spine: Secondary | ICD-10-CM | POA: Insufficient documentation

## 2024-01-15 DIAGNOSIS — G8929 Other chronic pain: Secondary | ICD-10-CM

## 2024-01-15 DIAGNOSIS — Z7901 Long term (current) use of anticoagulants: Secondary | ICD-10-CM | POA: Insufficient documentation

## 2024-01-15 DIAGNOSIS — M62830 Muscle spasm of back: Secondary | ICD-10-CM | POA: Insufficient documentation

## 2024-01-15 DIAGNOSIS — M545 Low back pain, unspecified: Secondary | ICD-10-CM | POA: Diagnosis present

## 2024-01-15 DIAGNOSIS — M4810 Ankylosing hyperostosis [Forestier], site unspecified: Secondary | ICD-10-CM | POA: Diagnosis present

## 2024-01-15 DIAGNOSIS — N183 Chronic kidney disease, stage 3 unspecified: Secondary | ICD-10-CM | POA: Insufficient documentation

## 2024-01-15 DIAGNOSIS — E1122 Type 2 diabetes mellitus with diabetic chronic kidney disease: Secondary | ICD-10-CM | POA: Insufficient documentation

## 2024-01-15 DIAGNOSIS — M5459 Other low back pain: Secondary | ICD-10-CM

## 2024-01-15 DIAGNOSIS — M47894 Other spondylosis, thoracic region: Secondary | ICD-10-CM | POA: Diagnosis present

## 2024-01-15 MED ORDER — DEXAMETHASONE SOD PHOSPHATE PF 10 MG/ML IJ SOLN
20.0000 mg | Freq: Once | INTRAMUSCULAR | Status: AC
  Administered 2024-01-15: 20 mg

## 2024-01-15 MED ORDER — MIDAZOLAM HCL 5 MG/5ML IJ SOLN
0.5000 mg | Freq: Once | INTRAMUSCULAR | Status: AC
  Administered 2024-01-15: 2.5 mg via INTRAVENOUS

## 2024-01-15 MED ORDER — MIDAZOLAM HCL 5 MG/5ML IJ SOLN
INTRAMUSCULAR | Status: AC
  Filled 2024-01-15: qty 5

## 2024-01-15 MED ORDER — LIDOCAINE HCL 2 % IJ SOLN
INTRAMUSCULAR | Status: AC
Start: 2024-01-15 — End: 2024-01-15
  Filled 2024-01-15: qty 20

## 2024-01-15 MED ORDER — FENTANYL CITRATE (PF) 100 MCG/2ML IJ SOLN: 25.0000 ug | INTRAMUSCULAR | Status: DC | PRN

## 2024-01-15 MED ORDER — PENTAFLUOROPROP-TETRAFLUOROETH EX AERO
INHALATION_SPRAY | Freq: Once | CUTANEOUS | Status: AC
  Administered 2024-01-15: 30 via TOPICAL

## 2024-01-15 MED ORDER — ROPIVACAINE HCL 2 MG/ML IJ SOLN
INTRAMUSCULAR | Status: AC
Start: 2024-01-15 — End: 2024-01-15
  Filled 2024-01-15: qty 20

## 2024-01-15 MED ORDER — ROPIVACAINE HCL 2 MG/ML IJ SOLN
18.0000 mL | Freq: Once | INTRAMUSCULAR | Status: AC
  Administered 2024-01-15: 18 mL via PERINEURAL

## 2024-01-15 MED ORDER — LIDOCAINE HCL 2 % IJ SOLN
20.0000 mL | Freq: Once | INTRAMUSCULAR | Status: AC
  Administered 2024-01-15: 400 mg

## 2024-01-15 MED ORDER — FENTANYL CITRATE (PF) 100 MCG/2ML IJ SOLN
INTRAMUSCULAR | Status: AC
  Filled 2024-01-15: qty 2

## 2024-01-15 NOTE — Progress Notes (Signed)
 Safety precautions to be maintained throughout the outpatient stay will include: orient to surroundings, keep bed in low position, maintain call bell within reach at all times, provide assistance with transfer out of bed and ambulation.

## 2024-01-15 NOTE — Patient Instructions (Addendum)
 ______________________________________________________________________    Post-Procedure Discharge Instructions  INSTRUCTIONS Apply ice:  Purpose: This will minimize any swelling and discomfort after procedure.  When: Day of procedure, as soon as you get home. How: Fill a plastic sandwich bag with crushed ice. Cover it with a small towel and apply to injection site. How long: (15 min on, 15 min off) Apply for 15 minutes then remove x 15 minutes.  Repeat sequence on day of procedure, until you go to bed. Apply heat:  Purpose: To treat any soreness and discomfort from the procedure. When: Starting the next day after the procedure. How: Apply heat to procedure site starting the day following the procedure. How long: May continue to repeat daily, until discomfort goes away. Food intake: Start with clear liquids (like water) and advance to regular food, as tolerated.  Physical activities: Keep activities to a minimum for the first 8 hours after the procedure. After that, then as tolerated. Driving: If you have received any sedation, be responsible and do not drive. You are not allowed to drive for 24 hours after having sedation. Blood thinner: (Applies only to those taking blood thinners) You may restart your blood thinner 6 hours after your procedure. Insulin : (Applies only to Diabetic patients taking insulin ) As soon as you can eat, you may resume your normal dosing schedule. Infection prevention: Keep procedure site clean and dry. Shower daily and clean area with soap and water.  PAIN DIARY Post-procedure Pain Diary: Extremely important that this be done correctly and accurately. Recorded information will be used to determine the next step in treatment. For the purpose of accuracy, follow these rules: Evaluate only the area treated. Do not report or include pain from an untreated area. For the purpose of this evaluation, ignore all other areas of pain, except for the treated area. After your  procedure, avoid taking a long nap and attempting to complete the pain diary after you wake up. Instead, set your alarm clock to go off every hour, on the hour, for the initial 8 hours after the procedure. Document the duration of the numbing medicine, and the relief you are getting from it. Do not go to sleep and attempt to complete it later. It will not be accurate. If you received sedation, it is likely that you were given a medication that may cause amnesia. Because of this, completing the diary at a later time may cause the information to be inaccurate. This information is needed to plan your care. Follow-up appointment: Keep your post-procedure follow-up evaluation appointment after the procedure (usually 2 weeks for most procedures, 6 weeks for radiofrequencies). DO NOT FORGET to bring you pain diary with you.   EXPECT... (What should I expect to see with my procedure?) From numbing medicine (AKA: Local Anesthetics): Numbness or decrease in pain. You may also experience some weakness, which if present, could last for the duration of the local anesthetic. Onset: Full effect within 15 minutes of injected. Duration: It will depend on the type of local anesthetic used. On the average, 1 to 8 hours.  From steroids (Applies only if steroids were used): Decrease in swelling or inflammation. Once inflammation is improved, relief of the pain will follow. Onset of benefits: Depends on the amount of swelling present. The more swelling, the longer it will take for the benefits to be seen. In some cases, up to 10 days. Duration: Steroids will stay in the system x 2 weeks. Duration of benefits will depend on multiple posibilities including persistent irritating  factors. Side-effects: If present, they may typically last 2 weeks (the duration of the steroids). Frequent: Cramps (if they occur, drink Gatorade and take over-the-counter Magnesium 450-500 mg once to twice a day); water retention with temporary weight  gain; increases in blood sugar; decreased immune system response; increased appetite. Occasional: Facial flushing (red, warm cheeks); mood swings; menstrual changes. Uncommon: Long-term decrease or suppression of natural hormones; bone thinning. (These are more common with higher doses or more frequent use. This is why we prefer that our patients avoid having any injection therapies in other practices.)  Very Rare: Severe mood changes; psychosis; aseptic necrosis. From procedure: Some discomfort is to be expected once the numbing medicine wears off. This should be minimal if ice and heat are applied as instructed.  CALL IF... (When should I call?) You experience numbness and weakness that gets worse with time, as opposed to wearing off. New onset bowel or bladder incontinence. (Applies only to procedures done in the spine)  Emergency Numbers: Durning business hours (Monday - Thursday, 8:00 AM - 4:00 PM) (Friday, 9:00 AM - 12:00 Noon): [phone] After hours: [phone] NOTE: If you are having a problem and are unable connect with, or to talk to a provider, then go to your nearest urgent care or emergency department. If the problem is serious and urgent, please call 911. ______________________________________________________________________     ______________________________________________________________________    Steroid injections  Common steroids for injections Triamcinolone: Used by many sports medicine physicians for large joint and bursal injections, often combined with a local anesthetic like lidocaine . A study focusing on coccydynia (tailbone pain) found triamcinolone was more effective than betamethasone, suggesting it may also be preferable for other localized inflammation conditions. Methylprednisolone: A common alternative to triamcinolone that is also a strong anti-inflammatory. It is available in different formulations, with the acetate suspension being the long-acting  option for intra-articular injections. Dexamethasone: This is a non-particulate steroid, meaning it has a lower risk of tissue damage compared to particulate steroids like triamcinolone and methylprednisolone. While less common for this specific use, it is an option for targeted injections.   Considerations for physicians Particulate vs. non-particulate steroids: Triamcinolone and methylprednisolone are particulate, meaning they can clump together. Dexamethasone is non-particulate. Particulate steroids are often preferred for their longer-lasting effects but carry a theoretical higher risk for certain injections (though this is less of a concern in the costochondral joints). Combined injectate: Corticosteroids are typically mixed with a local anesthetic like lidocaine  to provide both immediate pain relief (from the anesthetic) and longer-term inflammation reduction (from the steroid). Imaging guidance: To ensure accurate placement of the needle and medication, physicians may use ultrasound or fluoroscopic guidance for the injection, especially in complex or refractory cases.   Patient guidance Before undergoing a steroid injection, discuss the options with your physician. They will determine the best steroid, dosage, and procedure for your specific case based on factors like: Severity of your condition History of response to other treatments Your overall health status Experience and preference of the physician  Last  Updated: 11/20/2023 ______________________________________________________________________      ______________________________________________________________________    Muscle Spasms & Cramps  Cause(s):  Most common - vitamin and/or electrolyte (calcium , potassium, sodium, etc.) deficiencies. Post procedure - steroids (injected, oral, or inhaled) can make your kidneys excrete (loose) electrolytes. Most of the time this will not cause any symptoms however, if you happen to be  borderline low on your electrolytes, it may temporarily triggering cramps & spasms.  Possible triggers: Sweating -  causes loss of electrolytes thru the skin. Steroids - causes loss of electrolytes thru the urine.  Treatment: (over-the-counter)  Gatorade (or any other electrolyte-replenishing drink) - Take 1, 8 oz glass with each meal (3 times a day). Mechanism of action: Replenishes lost electrolytes. Magnesium 400 to 500 mg - Take 1 tablet twice a day (one with breakfast and one at bedtime). If you have kidney disease talk to your primary care physician before taking any Magnesium. Mechanism of action: Magnesium is a natural muscle relaxant. Tonic Water with quinine - Take 1, 8 oz glass before bedtime.  Mechanism of action: Quinine is used to treat spasms.  Last Update: 10/05/2022  ______________________________________________________________________     ______________________________________________________________________    Blood Thinners  IMPORTANT NOTICE:  If you take any of these, make sure to notify the nursing staff.  Failure to do so may result in serious injury.  Recommended time intervals to stop and restart blood-thinners, before & after invasive procedures  Generic Name Brand Name Pre-procedure: Stop medication for this amount of time before your procedure: Post-procedure: Wait this amount of time after the procedure before restarting your medication:  Abciximab Reopro 15 days 2 hrs  Alteplase Activase 10 days 10 days  Anagrelide Agrylin    Apixaban Eliquis 3 days 6 hrs  Cilostazol Pletal 3 days 5 hrs  Clopidogrel  Plavix  7-10 days 2 hrs  Dabigatran Pradaxa 5 days 6 hrs  Dalteparin Fragmin 24 hours 4 hrs  Dipyridamole Aggrenox 11days 2 hrs  Edoxaban Lixiana; Savaysa 3 days 2 hrs  Enoxaparin  Lovenox 24 hours 4 hrs  Eptifibatide Integrillin 8 hours 2 hrs  Fondaparinux  Arixtra 72 hours 12 hrs  Hydroxychloroquine Plaquenil 11 days   Prasugrel Effient 7-10 days 6  hrs  Reteplase Retavase 10 days 10 days  Rivaroxaban Xarelto 3 days 6 hrs  Ticagrelor Brilinta 5-7 days 6 hrs  Ticlopidine Ticlid 10-14 days 2 hrs  Tinzaparin Innohep 24 hours 4 hrs  Tirofiban Aggrastat 8 hours 2 hrs  Warfarin Coumadin 5 days 2 hrs   Other medications with blood-thinning effects  NOTE: Consider stopping these if you have prolonged bleeding despite not taking any of the above blood thinners. Otherwise ask your provider and this will be decided on a case-by-case basis.  Product indications Generic (Brand) names Note  Cholesterol Lipitor Stop 4 days before procedure  Blood thinner (injectable) Heparin  (LMW or LMWH Heparin ) Stop 24 hours before procedure  Cancer Ibrutinib (Imbruvica) Stop 7 days before procedure  Malaria/Rheumatoid Hydroxychloroquine (Plaquenil) Stop 11 days before procedure  Thrombolytics  10 days before or after procedures   Over-the-counter (OTC) Products with blood-thinning effects  NOTE: Consider stopping these if you have prolonged bleeding despite not taking any of the above blood thinners. Otherwise ask your provider and this will be decided on a case-by-case basis.  Product Common names Stop Time  Aspirin > 325 mg Goody Powders, Excedrin, etc. 11 days  Aspirin <= 81 mg  7 days  Fish oil  4 days  Garlic supplements  7 days  Ginkgo biloba  36 hours  Ginseng  24 hours  NSAIDs Ibuprofen, Naprosyn, etc. 3 days  Vitamin E  4 days   ______________________________________________________________________   Moderate Conscious Sedation, Adult, Care After After the procedure, it is common to have: Sleepiness for a few hours. Impaired judgment for a few hours. Trouble with balance. Nausea or vomiting if you eat too soon. Follow these instructions at home: For the time period you were told by  your health care provider:  Rest. Do not participate in activities where you could fall or become injured. Do not drive or use machinery. Do not drink  alcohol. Do not take sleeping pills or medicines that cause drowsiness. Do not make important decisions or sign legal documents. Do not take care of children on your own. Eating and drinking Follow instructions from your health care provider about what you may eat and drink. Drink enough fluid to keep your urine pale yellow. If you vomit: Drink clear fluids slowly and in small amounts as you are able. Clear fluids include water, ice chips, low-calorie sports drinks, and fruit juice that has water added to it (diluted fruit juice). Eat light and bland foods in small amounts as you are able. These foods include bananas, applesauce, rice, lean meats, toast, and crackers. General instructions Take over-the-counter and prescription medicines only as told by your health care provider. Have a responsible adult stay with you for the time you are told. Do not use any products that contain nicotine or tobacco. These products include cigarettes, chewing tobacco, and vaping devices, such as e-cigarettes. If you need help quitting, ask your health care provider. Return to your normal activities as told by your health care provider. Ask your health care provider what activities are safe for you. Your health care provider may give you more instructions. Make sure you know what you can and cannot do. Contact a health care provider if: You are still sleepy or having trouble with balance after 24 hours. You feel light-headed. You vomit every time you eat or drink. You get a rash. You have a fever. You have redness or swelling around the IV site. Get help right away if: You have trouble breathing. You start to feel confused at home. These symptoms may be an emergency. Get help right away. Call 911. Do not wait to see if the symptoms will go away. Do not drive yourself to the hospital. This information is not intended to replace advice given to you by your health care provider. Make sure you discuss any  questions you have with your health care provider. Document Revised: 09/26/2021 Document Reviewed: 09/26/2021 [employer]vier Patient Education  2024 [employer].

## 2024-01-15 NOTE — Progress Notes (Addendum)
 PROVIDER NOTE: Interpretation of information contained herein should be left to medically-trained personnel. Specific patient instructions are provided elsewhere under Patient Instructions section of medical record. This document was created in part using STT-dictation technology, any transcriptional errors that may result from this process are unintentional.  Patient: Danielle Park Type: Established DOB: October 29, 1950 MRN: [id number] PCP: Abbey Bruckner, MD  Service: Procedure DOS: 01/15/2024 Setting: Ambulatory Location: Ambulatory outpatient facility Delivery: Face-to-face Provider: Eric DELENA Como, MD Specialty: Interventional Pain Management Specialty designation: 09 Location: Outpatient facility Ref. Prov.: Como Eric, MD       Interventional Therapy   Procedure: Thoracic facet medial branch block w/ fluoroscopic mapping #1  Laterality: Bilateral (-50)  Level: T4, T5, T6, and T7 Medial Branch Level(s)  Imaging: Fluoroscopy-guided Spinal ([id number]) Anesthesia: Local anesthesia (1-2% Lidocaine ) Anxiolysis: IV Versed  2.5 mg Sedation: Minimal Sedation None required. No Fentanyl  administered.         DOS: 01/15/2024  Performed by: Eric DELENA Como, MD  Purpose: Diagnostic/Therapeutic Indications: Thoracic back pain severe enough to impact quality of life or function. Rationale (medical necessity): procedure needed and proper for the diagnosis and/or treatment of Danielle Park's medical symptoms and needs. 1. Chronic thoracic back pain (1ry area of Pain) (Intermittent) (Bilateral)   2. Thoracic facet joint pain   3. Thoracolumbar back pain   4. Thoracic facet syndrome   5. Thoracic facet arthropathy (Multilevel) (Bilateral)   6. Spondylosis without myelopathy or radiculopathy, thoracic region   7. DISH (diffuse idiopathic skeletal hyperostosis)   8. Spasm of thoracic back muscle   9. Chronic anticoagulation (Plavix )    NAS-11 Pain score:   Pre-procedure: 2 /10    Post-procedure: 0-No pain/10     Target: Thoracic posterior (dorsal) primary rami nerve Location: 2.5 cm lateral to midline (spinous process), just cephalad to upper transverse process edge.  (needle tip placement) Region: Thoracic  Approach: Paravertebral  Type of procedure: Percutaneous perineural nerve block   Pertinent Anatomy: There are two potential fascial compartments in the TPVS: the anterior extrapleural paravertebral compartment and the posterior subendothoracic paravertebral compartment. The transverse process and the superior costotransverse ligament form the posterior boundary.  Position / Prep / Materials:  Position: Prone  Prep solution: ChloraPrep (2% chlorhexidine gluconate and 70% isopropyl alcohol) Prep Area: Entire upper back region Materials:  Tray: Block Needle(s):  Type: Spinal  Gauge (G): 22  Length: 3.5-in  Qty: 5  H&P (Pre-op Assessment):  Danielle Park is a 73 y.o. (year old), female patient, seen today for interventional treatment. She  has a past surgical history that includes Cesarean section; Vaginal delivery; Renal artery stent (2014); Cardiac catheterization (N/A, 01/11/2015); and Cardiac catheterization (01/11/2015). Danielle Park has a current medication list which includes the following prescription(s): aspirin, baclofen , cholecalciferol, clopidogrel , dapagliflozin  propanediol, trulicity , furosemide , levothyroxine , lisinopril , metoprolol  tartrate, rosuvastatin , and trazodone , and the following Facility-Administered Medications: fentanyl . Her primarily concern today is the Back Pain  Initial Vital Signs:  Pulse/HCG Rate: 62ECG Heart Rate: 64 (nsr) Temp: (!) 96.9 F (36.1 C) Resp: 16 BP: (!) 211/67 SpO2: 100 %  BMI: Estimated body mass index is 29.26 kg/m as calculated from the following:   Height as of this encounter: 5' 2 (1.575 m).   Weight as of this encounter: 160 lb (72.6 kg).  Risk Assessment: Allergies: Reviewed. She has no known  allergies.  Allergy Precautions: None required Coagulopathies: Reviewed. None identified.  Blood-thinner therapy: None at this time Active Infection(s): Reviewed. None identified. Danielle Park is afebrile  Site Confirmation:  Danielle Park was asked to confirm the procedure and laterality before marking the site Procedure checklist: Completed Consent: Before the procedure and under the influence of no sedative(s), amnesic(s), or anxiolytics, the patient was informed of the treatment options, risks and possible complications. To fulfill our ethical and legal obligations, as recommended by the American Medical Association's Code of Ethics, I have informed the patient of my clinical impression; the nature and purpose of the treatment or procedure; the risks, benefits, and possible complications of the intervention; the alternatives, including doing nothing; the risk(s) and benefit(s) of the alternative treatment(s) or procedure(s); and the risk(s) and benefit(s) of doing nothing. The patient was provided information about the general risks and possible complications associated with the procedure. These may include, but are not limited to: failure to achieve desired goals, infection, bleeding, organ or nerve damage, allergic reactions, paralysis, and death. In addition, the patient was informed of those risks and complications associated to Spine-related procedures, such as failure to decrease pain; infection (i.e.: Meningitis, epidural or intraspinal abscess); bleeding (i.e.: epidural hematoma, subarachnoid hemorrhage, or any other type of intraspinal or peri-dural bleeding); organ or nerve damage (i.e.: Any type of peripheral nerve, nerve root, or spinal cord injury) with subsequent damage to sensory, motor, and/or autonomic systems, resulting in permanent pain, numbness, and/or weakness of one or several areas of the body; allergic reactions; (i.e.: anaphylactic reaction); and/or death. Furthermore, the patient  was informed of those risks and complications associated with the medications. These include, but are not limited to: allergic reactions (i.e.: anaphylactic or anaphylactoid reaction(s)); adrenal axis suppression; blood sugar elevation that in diabetics may result in ketoacidosis or comma; water retention that in patients with history of congestive heart failure may result in shortness of breath, pulmonary edema, and decompensation with resultant heart failure; weight gain; swelling or edema; medication-induced neural toxicity; particulate matter embolism and blood vessel occlusion with resultant organ, and/or nervous system infarction; and/or aseptic necrosis of one or more joints. Finally, the patient was informed that Medicine is not an exact science; therefore, there is also the possibility of unforeseen or unpredictable risks and/or possible complications that may result in a catastrophic outcome. The patient indicated having understood very clearly. We have given the patient no guarantees and we have made no promises. Enough time was given to the patient to ask questions, all of which were answered to the patient's satisfaction. Ms. Gunnerson has indicated that she wanted to continue with the procedure. Attestation: I, the ordering provider, attest that I have discussed with the patient the benefits, risks, side-effects, alternatives, likelihood of achieving goals, and potential problems during recovery for the procedure that I have provided informed consent. Date  Time: 01/15/2024 10:20 AM  Pre-Procedure Preparation:  Monitoring: As per clinic protocol. Respiration, ETCO2, SpO2, BP, heart rate and rhythm monitor placed and checked for adequate function Safety Precautions: Patient was assessed for positional comfort and pressure points before starting the procedure. Time-out: I initiated and conducted the Time-out before starting the procedure, as per protocol. The patient was asked to participate by  confirming the accuracy of the Time Out information. Verification of the correct person, site, and procedure were performed and confirmed by me, the nursing staff, and the patient. Time-out conducted as per Joint Commission's Universal Protocol (UP.01.01.01). Time: 1135 Start Time: 1135 hrs.  Description/Narrative of Procedure:          Start Time: 1135 hrs. Technical description of procedure:  Rationale (medical necessity): procedure needed and proper for the  diagnosis and/or treatment of the patient's medical symptoms and needs. Procedural Technique Safety Precautions: Aspiration looking for blood return was conducted prior to all injections. At no point did we inject any substances, as a needle was being advanced. No attempts were made at seeking any paresthesias. Safe injection practices and needle disposal techniques used. Medications properly checked for expiration dates. SDV (single dose vial) medications used. Target Area: For the thoracic medial branch nerve, the target is located between the top of the transverse processes at the point where the transverse process joints the vertebra and the superior-lateral aspect of the transverse process.  (More lateral towards the superior aspect of the thoracic spine.) For safety reasons and to minimize the risk of hemo- or pneumothorax, the needle tip was not advanced past the boundary of the posterior paravertebral compartment (superior costotransverse ligament). Description of the Procedure: Protocol guidelines were followed. The patient was placed in position over the fluoroscopy table. The target area was identified and the area prepped in the usual manner. Skin & deeper tissues infiltrated with local anesthetic. Appropriate amount of time allowed to pass for local anesthetics to take effect. The procedure needles were then advanced to the target area. Proper needle placement secured. Negative aspiration confirmed. Solution injected in intermittent  fashion, asking for systemic symptoms every 0.5cc of injectate. The needles were then removed and the area cleansed, making sure to leave some of the prepping solution back to take advantage of its long term bactericidal properties.             Facet Joint Innervation  T1-2 C8, T1  Medial Branch  T2-3 T1, T2                     T3-4 T2, T3                     T4-5 T3, T4                     T5-6 T4, T5                     T6-7 T5, T6                     T7-8 T6, T7                     T8-9 T7, T8                     T9-10 T8, T9                     T10-11 T9, T10                     T11-12 T10, T11                     T12-L1 T11, T12                       Vitals:   01/15/24 1145 01/15/24 1150 01/15/24 1155 01/15/24 1200  BP: (!) 159/88 (!) 163/99 125/64 138/62  Pulse:      Resp: 18 17 18 16   Temp:   97.6 F (36.4 C)   TempSrc:   Temporal   SpO2: 100% 100% 100% 100%  Weight:      Height:  End Time: 1148 hrs.  Imaging Guidance (Spinal):         Type of Imaging Technique: Fluoroscopy Guidance (Spinal) Indication(s): Fluoroscopy guidance for needle placement to enhance accuracy in procedures requiring precise needle localization for targeted delivery of medication in or near specific anatomical locations not easily accessible without such real-time imaging assistance. Exposure Time: Please see nurses notes. Contrast: None used. Fluoroscopic Guidance: I was personally present during the use of fluoroscopy. Tunnel Vision Technique used to obtain the best possible view of the target area. Parallax error corrected before commencing the procedure. Direction-depth-direction technique used to introduce the needle under continuous pulsed fluoroscopy. Once target was reached, antero-posterior, oblique, and lateral fluoroscopic projection used confirm needle placement in all planes. Images permanently stored in EMR. Interpretation: No contrast injected.  I personally interpreted the imaging intraoperatively. Adequate needle placement confirmed in multiple planes. Permanent images saved into the patient's record.  Post-operative Assessment:  Post-procedure Vital Signs:  Pulse/HCG Rate: 6262 Temp: 97.6 F (36.4 C) Resp: 16 BP: 138/62 SpO2: 100 %  EBL: None  Complications: No immediate post-treatment complications observed by team, or reported by patient.  Note: The patient tolerated the entire procedure well. A repeat set of vitals were taken after the procedure and the patient was kept under observation following institutional policy, for this type of procedure. Post-procedural neurological assessment was performed, showing return to baseline, prior to discharge. The patient was provided with post-procedure discharge instructions, including a section on how to identify potential problems. Should any problems arise concerning this procedure, the patient was given instructions to immediately contact us , at any time, without hesitation. In any case, we plan to contact the patient by telephone for a follow-up status report regarding this interventional procedure.  Comments:  No additional relevant information.  Plan of Care (POC)  Orders:  Orders Placed This Encounter  Procedures   THORACIC FACET BLOCK    Scheduling Instructions:     Thoracic Medial Branch Block     Side: Bilateral     Level(s): TBD     Sedation: With Sedation.     Date: 01/15/2024    Where will this procedure be performed?:   [facility]   DG [facility] C-ARM 1-60 MIN NO REPORT    Intraoperative interpretation by procedural physician at [facility].    Standing Status:   Standing    Number of Occurrences:   1    Reason for exam::   Assistance in needle guidance and placement for procedures requiring needle placement in or near specific anatomical locations not easily accessible without such assistance.   Informed Consent Details:  Physician/Practitioner Attestation; Transcribe to consent form and obtain patient signature    Nursing Order: Transcribe to consent form and obtain patient signature. Note: Always confirm laterality of pain with Ms. Nicholaus, before procedure.    Physician/Practitioner attestation of informed consent for procedure/surgical case:   I, the physician/practitioner, attest that I have discussed with the patient the benefits, risks, side effects, alternatives, likelihood of achieving goals and potential problems during recovery for the procedure that I have provided informed consent.    Procedure:   Thoracic medial branch facet block    Physician/Practitioner performing the procedure:   Shantaya Bluestone A. Tanya, MD    Indication/Reason:   Thoracic upper back pain associated with thoracic facet syndrome (F52.105) and thoracic spondylosis without myelopathy or radiculopathy (F52.185)   Care order/instruction: Please confirm that the patient has stopped the Plavix  (Clopidogrel ) x 7-10 days  prior to procedure or surgery.    Please confirm that the patient has stopped the Plavix  (Clopidogrel ) x 7-10 days prior to procedure or surgery.    Standing Status:   Standing    Number of Occurrences:   1   Provide equipment / supplies at bedside    Procedure tray: Block Tray (Disposable  single use) Skin infiltration needle: Regular 1.5-in, 25-G, (x1) Block Needle type: Spinal Amount/quantity: 5 Size: Regular (3.5-inch) Gauge: 22G    Standing Status:   Standing    Number of Occurrences:   1    Specify:   Block Tray   Saline lock IV    Have LR [phone] mL available and administer at 125 mL/hr if patient becomes hypotensive.    Standing Status:   Standing    Number of Occurrences:   1   Bleeding precautions    Standing Status:   Standing    Number of Occurrences:   1     Opioid Analgesic: None MME/day: 0 mg/day    Medications ordered for procedure: Meds ordered this encounter  Medications   lidocaine   (XYLOCAINE ) 2 % (with pres) injection 400 mg   pentafluoroprop-tetrafluoroeth (GEBAUERS) aerosol   midazolam  (VERSED ) 5 MG/5ML injection 0.5-2 mg    Make sure Flumazenil is available in the pyxis when using this medication. If oversedation occurs, administer 0.2 mg IV over 15 sec. If after 45 sec no response, administer 0.2 mg again over 1 min; may repeat at 1 min intervals; not to exceed 4 doses (1 mg)   fentaNYL  (SUBLIMAZE ) injection 25-50 mcg    Make sure Narcan is available in the pyxis when using this medication. In the event of respiratory depression (RR< 8/min): Titrate NARCAN (naloxone) in increments of 0.1 to 0.2 mg IV at 2-3 minute intervals, until desired degree of reversal.   ropivacaine (PF) 2 mg/mL (0.2%) (NAROPIN) injection 18 mL   dexamethasone (DECADRON) injection 20 mg   Medications administered: We administered lidocaine , pentafluoroprop-tetrafluoroeth, midazolam , ropivacaine (PF) 2 mg/mL (0.2%), and dexamethasone.  See the medical record for exact dosing, route, and time of administration.    Interventional Therapies  Risk Factors  Considerations  Medical Comorbidities:  Plavix  Anticoagulation: (Stop: 7 days  Restart: 2 hours)  Hx. DVT  T2NIDDM w/ Stage III CKD  Single Kidney  Renal Artery Stenosis  HTN  DISH  PVD     Planned  Pending:   Diagnostic/therapeutic bilateral thoracic facet (T2-3, T3-4, and T4-5) MBB #1    Under consideration:   Diagnostic/therapeutic bilateral thoracic facet (T2-3, T3-4, and T4-5) MBB #1    Completed: (Analgesic benefit)1  None at this time   Therapeutic  Palliative (PRN) options:   None established   Completed by other providers:   None reported  1(Analgesic benefit): Expressed in percentage (%). (Local anesthetic[LA] +/- sedation  L.A.Local Anesthetic  Steroid benefit  Ongoing benefit)      Follow-up plan:   Return in about 2 weeks (around 01/29/2024) for (Face2F), (PPE).     Recent Visits Date Type Provider  Dept  12/05/23 Office Visit Tanya Glisson, MD [facility]  11/07/23 Office Visit Tanya Glisson, MD [facility]  Showing recent visits within past 90 days and meeting all other requirements Today's Visits Date Type Provider Dept  01/15/24 Procedure visit Tanya Glisson, MD [facility]  Showing today's visits and meeting all other requirements Future Appointments Date Type Provider Dept  02/04/24 Appointment Tanya Glisson, MD [facility]  Showing  future appointments within next 90 days and meeting all other requirements   Disposition: Discharge home  Discharge (Date  Time): 01/15/2024;   hrs.   Primary Care Physician: Bair, Kalpana, MD Location: [facility] Note by: Eric DELENA Como, MD (TTS technology used. I apologize for any typographical errors that were not detected and corrected.) Date: 01/15/2024; Time: 12:45 PM  Disclaimer:  Medicine is not an Visual merchandiser. The only guarantee in medicine is that nothing is guaranteed. It is important to note that the decision to proceed with this intervention was based on the information collected from the patient. The Data and conclusions were drawn from the patient's questionnaire, the interview, and the physical examination. Because the information was provided in large part by the patient, it cannot be guaranteed that it has not been purposely or unconsciously manipulated. Every effort has been made to obtain as much relevant data as possible for this evaluation. It is important to note that the conclusions that lead to this procedure are derived in large part from the available data. Always take into account that the treatment will also be dependent on availability of resources and existing treatment guidelines, considered by other Pain Management Practitioners as being common knowledge and practice, at the time of the intervention. For Medico-Legal  purposes, it is also important to point out that variation in procedural techniques and pharmacological choices are the acceptable norm. The indications, contraindications, technique, and results of the above procedure should only be interpreted and judged by a Board-Certified Interventional Pain Specialist with extensive familiarity and expertise in the same exact procedure and technique.

## 2024-01-16 ENCOUNTER — Telehealth: Payer: Self-pay | Admitting: *Deleted

## 2024-01-16 NOTE — Telephone Encounter (Signed)
 Post procedure call:   no  questions or concerns.

## 2024-01-21 ENCOUNTER — Telehealth: Payer: Self-pay

## 2024-01-21 NOTE — Telephone Encounter (Signed)
 Received AZ&ME  PEP, requesting pharmacy input to help assist patient for diabetic medications.   Luke Shade, MD

## 2024-01-22 ENCOUNTER — Encounter: Payer: Self-pay | Admitting: General Surgery

## 2024-01-23 ENCOUNTER — Encounter
Admission: RE | Disposition: A | Discharge status: Home / Self Care | Payer: Self-pay | Source: Home / Self Care | Attending: General Surgery

## 2024-01-23 ENCOUNTER — Ambulatory Visit: Admitting: Certified Registered"

## 2024-01-23 ENCOUNTER — Ambulatory Visit
Admission: RE | Admit: 2024-01-23 | Discharge: 2024-01-23 | Disposition: A | Discharge status: Home / Self Care | Attending: General Surgery | Admitting: General Surgery

## 2024-01-23 ENCOUNTER — Encounter: Payer: Self-pay | Admitting: General Surgery

## 2024-01-23 DIAGNOSIS — Z87891 Personal history of nicotine dependence: Secondary | ICD-10-CM | POA: Diagnosis not present

## 2024-01-23 DIAGNOSIS — D125 Benign neoplasm of sigmoid colon: Secondary | ICD-10-CM | POA: Insufficient documentation

## 2024-01-23 DIAGNOSIS — E039 Hypothyroidism, unspecified: Secondary | ICD-10-CM | POA: Diagnosis not present

## 2024-01-23 DIAGNOSIS — Z1211 Encounter for screening for malignant neoplasm of colon: Secondary | ICD-10-CM | POA: Insufficient documentation

## 2024-01-23 DIAGNOSIS — K5730 Diverticulosis of large intestine without perforation or abscess without bleeding: Secondary | ICD-10-CM | POA: Insufficient documentation

## 2024-01-23 DIAGNOSIS — Z7984 Long term (current) use of oral hypoglycemic drugs: Secondary | ICD-10-CM | POA: Diagnosis not present

## 2024-01-23 DIAGNOSIS — K649 Unspecified hemorrhoids: Secondary | ICD-10-CM | POA: Diagnosis not present

## 2024-01-23 DIAGNOSIS — E1151 Type 2 diabetes mellitus with diabetic peripheral angiopathy without gangrene: Secondary | ICD-10-CM | POA: Insufficient documentation

## 2024-01-23 DIAGNOSIS — I10 Essential (primary) hypertension: Secondary | ICD-10-CM | POA: Diagnosis not present

## 2024-01-23 DIAGNOSIS — Z79899 Other long term (current) drug therapy: Secondary | ICD-10-CM | POA: Diagnosis not present

## 2024-01-23 HISTORY — PX: COLONOSCOPY: SHX5424

## 2024-01-23 HISTORY — PX: POLYPECTOMY: SHX149

## 2024-01-23 LAB — GLUCOSE, CAPILLARY: Glucose-Capillary: 200 mg/dL — ABNORMAL HIGH (ref 70–99)

## 2024-01-23 SURGERY — COLONOSCOPY
Anesthesia: General

## 2024-01-23 MED ORDER — SODIUM CHLORIDE 0.9 % IV SOLN
INTRAVENOUS | Status: DC
  Administered 2024-01-23: 20 mL/h via INTRAVENOUS

## 2024-01-23 MED ORDER — EPHEDRINE SULFATE (PRESSORS) 25 MG/5ML IV SOSY
PREFILLED_SYRINGE | INTRAVENOUS | Status: DC | PRN
  Administered 2024-01-23: 10 mg via INTRAVENOUS

## 2024-01-23 MED ORDER — PROPOFOL 10 MG/ML IV BOLUS
INTRAVENOUS | Status: DC | PRN
  Administered 2024-01-23: 30 mg via INTRAVENOUS
  Administered 2024-01-23 (×3): 20 mg via INTRAVENOUS
  Administered 2024-01-23: 40 mg via INTRAVENOUS
  Administered 2024-01-23 (×7): 20 mg via INTRAVENOUS

## 2024-01-23 MED ORDER — EPHEDRINE 5 MG/ML INJ
INTRAVENOUS | Status: AC
  Filled 2024-01-23: qty 5

## 2024-01-23 MED ORDER — LIDOCAINE HCL (PF) 2 % IJ SOLN
INTRAMUSCULAR | Status: DC | PRN
  Administered 2024-01-23: 40 mg via INTRADERMAL

## 2024-01-23 MED ORDER — PROPOFOL 1000 MG/100ML IV EMUL
INTRAVENOUS | Status: AC
  Filled 2024-01-23: qty 100

## 2024-01-23 NOTE — Anesthesia Preprocedure Evaluation (Signed)
 Anesthesia Evaluation  Patient identified by MRN, date of birth, ID band Patient awake    Reviewed: Allergy & Precautions, NPO status , Patient's Chart, lab work & pertinent test results  Airway Mallampati: II  TM Distance: >3 FB Neck ROM: full    Dental  (+) Teeth Intact   Pulmonary neg pulmonary ROS, former smoker   Pulmonary exam normal        Cardiovascular Exercise Tolerance: Good hypertension, Pt. on medications + Peripheral Vascular Disease  negative cardio ROS Normal cardiovascular exam Rhythm:Regular Rate:Normal     Neuro/Psych negative neurological ROS  negative psych ROS   GI/Hepatic negative GI ROS, Neg liver ROS,,,  Endo/Other  negative endocrine ROSdiabetes, Type 2, Oral Hypoglycemic AgentsHypothyroidism    Renal/GU negative Renal ROS  negative genitourinary   Musculoskeletal negative musculoskeletal ROS (+)    Abdominal Normal abdominal exam  (+)   Peds negative pediatric ROS (+)  Hematology negative hematology ROS (+)   Anesthesia Other Findings Past Medical History: No date: Chicken pox No date: Congenital doubling of uterus     Comment:  however s/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 live births No date: Congenital single kidney No date: Diabetes mellitus No date: Hypertension 04/18/2016: Right ankle pain No date: UTI (urinary tract infection)  Past Surgical History: No date: CESAREAN SECTION 01/11/2015: PERIPHERAL VASCULAR CATHETERIZATION; N/A     Comment:  Procedure: Abdominal Aortogram w/Lower Extremity;                Surgeon: Selinda GORMAN Gu, MD;  Location: [facility]               [facility];  Service: Cardiovascular;  Laterality: N/A; 01/11/2015: PERIPHERAL VASCULAR CATHETERIZATION     Comment:  Procedure: Lower Extremity Intervention;  Surgeon: Selinda GORMAN Gu, MD;  Location: [facility] CV LAB;  Service:               Cardiovascular;; 2014: RENAL ARTERY STENT     Comment:  Dr. Gu at [facility] No date: VAGINAL DELIVERY  BMI    Body Mass Index: 28.86 kg/m      Reproductive/Obstetrics negative OB ROS                              Anesthesia Physical Anesthesia Plan  ASA: 2  Anesthesia Plan: General   Post-op Pain Management:    Induction: Intravenous  PONV Risk Score and Plan: Propofol infusion and TIVA  Airway Management Planned: Natural Airway and Nasal Cannula  Additional Equipment:   Intra-op Plan:   Post-operative Plan:   Informed Consent: I have reviewed the patients History and Physical, chart, labs and discussed the procedure including the risks, benefits and alternatives for the proposed anesthesia with the patient or authorized representative who has indicated his/her understanding and acceptance.     Dental Advisory Given  Plan Discussed with: CRNA  Anesthesia Plan Comments:         Anesthesia Quick Evaluation

## 2024-01-23 NOTE — Transfer of Care (Signed)
 Immediate Anesthesia Transfer of Care Note  Patient: Graylin JINNY Moats  Procedure(s) Performed: COLONOSCOPY POLYPECTOMY, INTESTINE  Patient Location: PACU and Endoscopy Unit  Anesthesia Type:MAC  Level of Consciousness: drowsy  Airway & Oxygen Therapy: Patient Spontanous Breathing and Patient connected to nasal cannula oxygen  Post-op Assessment: Report given to RN and Post -op Vital signs reviewed and stable  Post vital signs: Reviewed and stable  Last Vitals:  Vitals Value Taken Time  BP 96/49 01/23/24 08:54  Temp    Pulse 73 01/23/24 08:54  Resp 17 01/23/24 08:54  SpO2 100 % 01/23/24 08:54  Vitals shown include unfiled device data.  Last Pain:  Vitals:   01/23/24 0755  TempSrc: Temporal  PainSc: 8          Complications: No notable events documented.

## 2024-01-23 NOTE — Op Note (Signed)
 [facility] Gastroenterology Patient Name: Danielle Park Procedure Date: 01/23/2024 7:57 AM MRN: [id number] Account #: [id number] Date of Birth: 03-12-1951 Admit Type: Ambulatory Age: 73 Room: [facility] ENDO ROOM 1 Gender: Female Note Status: Finalized Instrument Name: Colon Scope [id number] [id number] Procedure:             Colonoscopy Indications:           Screening for colorectal malignant neoplasm Providers:             Jayson KIDD. Marinda, MD Medicines:             See the Anesthesia note for documentation of the                         administered medications Complications:         No immediate complications. Estimated blood loss:                         Minimal. Procedure:             Pre-Anesthesia Assessment:                        - Prior to the procedure, a History and Physical was                         performed, and patient medications and allergies were                         reviewed. The patient's tolerance of previous                         anesthesia was also reviewed. The risks and benefits                         of the procedure and the sedation options and risks                         were discussed with the patient. All questions were                         answered, and informed consent was obtained. Prior                         Anticoagulants: The patient has taken Plavix                          (clopidogrel ), last dose was 14 days prior to                         procedure. ASA Grade Assessment: III - A patient with                         severe systemic disease. After reviewing the risks and                         benefits, the patient was deemed in satisfactory                         condition to undergo the  procedure.                        After obtaining informed consent, the colonoscope was                         passed under direct vision. Throughout the procedure,                         the patient's blood pressure, pulse, and oxygen                          saturations were monitored continuously. The                         Colonoscope was introduced through the anus and                         advanced to the the cecum, identified by appendiceal                         orifice and ileocecal valve. The colonoscopy was                         performed with ease. The patient tolerated the                         procedure well. The quality of the bowel preparation                         was excellent. Findings:      Hemorrhoids were found on perianal exam.      Scattered medium-mouthed diverticula were found in the entire colon.      A 7 mm polyp was found in the proximal sigmoid colon. The polyp was       sessile. The polyp was removed with a cold snare. Resection and       retrieval were complete. Estimated blood loss was minimal.      A 4 mm polyp was found in the mid sigmoid colon. The polyp was sessile.       The polyp was removed with a cold biopsy forceps. Resection and       retrieval were complete.      The exam was otherwise without abnormality. Impression:            - Hemorrhoids found on perianal exam.                        - Diverticulosis in the entire examined colon.                        - One 7 mm polyp in the proximal sigmoid colon,                         removed with a cold snare. Resected and retrieved.                        - One 4 mm polyp in the mid sigmoid colon, removed  with a cold biopsy forceps. Resected and retrieved.                        - The examination was otherwise normal. Recommendation:        - Discharge patient to home (ambulatory).                        - Resume previous diet.                        - Resume Plavix  (clopidogrel ) at prior dose in 2 days.                        - Repeat colonoscopy in 7-10 years for surveillance                         based on pathology results. Procedure Code(s):     --- Professional ---                         [phone], Colonoscopy, flexible; with removal of                         tumor(s), polyp(s), or other lesion(s) by snare                         technique                        45380, 59, Colonoscopy, flexible; with biopsy, single                         or multiple CPT copyright 2022 American Medical Association. All rights reserved. The codes documented in this report are preliminary and upon coder review may  be revised to meet current compliance requirements. Attending Participation:      I personally performed the entire procedure. Jayson MALVA Endow, MD 01/23/2024 9:00:49 AM Number of Addenda: 0 Note Initiated On: 01/23/2024 7:57 AM Scope Withdrawal Time: 0 hours 22 minutes 33 seconds  Total Procedure Duration: 0 hours 33 minutes 57 seconds  Estimated Blood Loss:  Estimated blood loss was minimal.      [facility]

## 2024-01-23 NOTE — Discharge Instructions (Signed)
 YOU HAD AN ENDOSCOPIC PROCEDURE TODAY: Refer to the procedure report that was given to you for any specific questions about what was found during the examination.  If the procedure report does not answer your questions, please call your gastroenterologist to clarify.  YOU SHOULD EXPECT: Some feelings of bloating in the abdomen. Passage of more gas than usual.  Walking can help get rid of the air that was put into your GI tract during the procedure and reduce the bloating. If you had a lower endoscopy (such as a colonoscopy or flexible sigmoidoscopy) you may notice spotting of blood in your stool or on the toilet paper.   DIET: Your first meal following the procedure should be a light meal and then it is ok to progress to your normal diet.  A half-sandwich or bowl of soup is an example of a good first meal.  Heavy or fried foods are harder to digest and may make you feel nasueas or bloated.  Drink plenty of fluids but you should avoid alcoholic beverages for 24 hours.  ACTIVITY: Your care partner should take you home directly after the procedure.  You should plan to take it easy, moving slowly for the rest of the day.  You can resume normal activity the day after the procedure however you should NOT DRIVE, make legal decisions or use heavy machinery for 24 hours (because of the sedation medicines used during the test).    SYMPTOMS TO REPORT IMMEDIATELY  A gastroenterologist can be reached at any hour.  Please call your doctor's office for any of the following symptoms:  Following lower endoscopy (colonoscopy, flexible sigmoidoscopy)  Excessive amounts of blood in the stool  Significant tenderness, worsening of abdominal pains  Swelling of the abdomen that is new, acute  Fever of 100 or higher Following upper endoscopy (EGD, EUS, ERCP)  Vomiting of blood or coffee ground material  New, significant abdominal pain  New, significant chest pain or pain under the shoulder blades  Painful or  persistently difficult swallowing  New shortness of breath  Black, tarry-looking stools  FOLLOW UP: If any biopsies were taken you will be contacted by phone or by letter within the next 1-3 weeks.  Call your gastroenterologist if you have not heard about the biopsies in 3 weeks.   Please also call your gastroenterologist's office with any specific questions about appointments or follow up tests.

## 2024-01-23 NOTE — H&P (Signed)
 Primary Care Physician:  Bair, Kalpana, MD Primary Gastroenterologist:  Dr. Marinda  Pre-Procedure History & Physical: HPI:  Danielle Park is a 73 y.o. female is here for an colonoscopy.   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   Congenital single kidney    Diabetes mellitus    Hypertension    Right ankle pain 04/18/2016   UTI (urinary tract infection)     Past Surgical History:  Procedure Laterality Date   CESAREAN SECTION     PERIPHERAL VASCULAR CATHETERIZATION N/A 01/11/2015   Procedure: Abdominal Aortogram w/Lower Extremity;  Surgeon: Selinda GORMAN Gu, MD;  Location: [facility] INVASIVE CV LAB;  Service: Cardiovascular;  Laterality: N/A;   PERIPHERAL VASCULAR CATHETERIZATION  01/11/2015   Procedure: Lower Extremity Intervention;  Surgeon: Selinda GORMAN Gu, MD;  Location: [facility] INVASIVE CV LAB;  Service: Cardiovascular;;   RENAL ARTERY STENT  2014   Dr. Gu at [facility]   VAGINAL DELIVERY      Prior to Admission medications   Medication Sig Start Date End Date Taking? Authorizing Provider  aspirin 81 MG tablet Take 81 mg by mouth daily.   Yes [provider]  baclofen  (LIORESAL ) 10 MG tablet Take 0.5 tablets (5 mg total) by mouth 2 (two) times daily as needed for muscle spasms. 12/10/23 03/09/24 Yes Bair, Kalpana, MD  cholecalciferol (VITAMIN D3) 25 MCG (1000 UT) tablet Take 2,000 Units by mouth daily.   Yes [provider]  clopidogrel  (PLAVIX ) 75 MG tablet Take 1 tablet (75 mg total) by mouth daily. 09/19/23  Yes Bair, Kalpana, MD  dapagliflozin  propanediol (FARXIGA ) 10 MG TABS tablet Take 1 tablet (10 mg total) by mouth daily before breakfast. 04/03/23  Yes Maribeth Camellia MATSU, MD  furosemide  (LASIX ) 20 MG tablet Take 1 tablet (20 mg total) by mouth daily. 09/19/23  Yes Bair, Kalpana, MD  levothyroxine  (SYNTHROID ) 75 MCG tablet TAKE ONE TABLET BY MOUTH ONCE DAILY BEFORE BREAKFAST 09/07/23   Yes Bair, Kalpana, MD  lisinopril  (ZESTRIL ) 40 MG tablet Take 1 tablet (40 mg total) by mouth daily. 10/30/23  Yes Bair, Kalpana, MD  metoprolol  tartrate (LOPRESSOR ) 25 MG tablet Take 1 tablet (25 mg total) by mouth 2 (two) times daily. 09/19/23  Yes Bair, Kalpana, MD  rosuvastatin  (CRESTOR ) 10 MG tablet Take 1 tablet (10 mg total) by mouth daily. 09/19/23  Yes Bair, Kalpana, MD  traZODone  (DESYREL ) 50 MG tablet Take 0.5-1 tablets (25-50 mg total) by mouth at bedtime as needed. 12/06/23  Yes Bair, Kalpana, MD  Dulaglutide  (TRULICITY ) 4.5 MG/0.5ML SOPN INJECT 4.5MG  (0.5ML) UNDER THE SKIN ONCE A WEEK 08/03/22   Maribeth Camellia MATSU, MD    Allergies as of 09/24/2023   (No Known Allergies)    Family History  Problem Relation Age of Onset   Hypertension Mother    Diabetes Mother    Heart disease Mother        s/p CABG x 2   Hypertension Father    Cancer Father        lung    Diabetes Sister    Heart disease Sister    Heart disease Sister    Cancer - Lung Brother    Cancer Sister        lung   Heart disease Other    Diabetes Other     Social History   Socioeconomic History   Marital status:  Widowed    Spouse name: Not on file   Number of children: Not on file   Years of education: Not on file   Highest education level: Not on file  Occupational History   Not on file  Tobacco Use   Smoking status: Never   Smokeless tobacco: Never  Vaping Use   Vaping status: Never Used  Substance and Sexual Activity   Alcohol use: No    Alcohol/week: 0.0 standard drinks of alcohol   Drug use: No   Sexual activity: Not on file  Other Topics Concern   Not on file  Social History Narrative   Lives in [place] with husband. 1 dog      Work - Administrator, civil service   Diet - regular diet   Exercise - occasional, very active at work   Social Drivers of [employer]: Low Risk  (09/12/2021)   Overall Financial Resource Strain (CARDIA)    Difficulty of Paying Living Expenses: Not hard  at all  Food Insecurity: No Food Insecurity (09/12/2021)   Hunger Vital Sign    Worried About Running Out of Food in the Last Year: Never true    Ran Out of Food in the Last Year: Never true  Transportation Needs: No Transportation Needs (09/12/2021)   PRAPARE - Administrator, Civil Service (Medical): No    Lack of Transportation (Non-Medical): No  Physical Activity: Sufficiently Active (09/12/2021)   Exercise Vital Sign    Days of Exercise per Week: 7 days    Minutes of Exercise per Session: 30 min  Stress: No Stress Concern Present (09/12/2021)   [employer] of Occupational Health - Occupational Stress Questionnaire    Feeling of Stress : Not at all  Social Connections: Unknown (09/12/2021)   Social Connection and Isolation Panel    Frequency of Communication with Friends and Family: Not on file    Frequency of Social Gatherings with Friends and Family: Not on file    Attends Religious Services: Not on file    Active Member of Clubs or Organizations: Not on file    Attends Banker Meetings: Not on file    Marital Status: Married  Intimate Partner Violence: Not At Risk (09/12/2021)   Humiliation, Afraid, Rape, and Kick questionnaire    Fear of Current or Ex-Partner: No    Emotionally Abused: No    Physically Abused: No    Sexually Abused: No    Review of Systems: See HPI, otherwise negative ROS  Physical Exam: BP (!) 126/49   Pulse (!) 55   Temp (!) 97 F (36.1 C) (Temporal)   Resp 20   Ht 5' 2 (1.575 m)   Wt 71.6 kg   SpO2 100%   BMI 28.86 kg/m  General:   Alert,  pleasant and cooperative in NAD Head:  Normocephalic and atraumatic. Neck:  Supple; no masses or thyromegaly. Lungs:  Clear throughout to auscultation.    Heart:  Regular rate and rhythm. Abdomen:  Soft, nontender and nondistended. Normal bowel sounds, without guarding, and without rebound.   Neurologic:  Alert and  oriented x4;  grossly normal  neurologically.  Impression/Plan: Danielle Park is here for an colonoscopy to be performed for screening  Risks, benefits, limitations, and alternatives regarding  colonoscopy have been reviewed with the patient.  Questions have been answered.  All parties agreeable.   Jayson MALVA Endow, MD  01/23/2024, 7:58 AM

## 2024-01-23 NOTE — Anesthesia Postprocedure Evaluation (Signed)
 Anesthesia Post Note  Patient: Danielle Park  Procedure(s) Performed: COLONOSCOPY POLYPECTOMY, INTESTINE  Patient location during evaluation: PACU Anesthesia Type: General Level of consciousness: awake Pain management: satisfactory to patient Vital Signs Assessment: post-procedure vital signs reviewed and stable Cardiovascular status: blood pressure returned to baseline and stable Anesthetic complications: no   No notable events documented.   Last Vitals:  Vitals:   01/23/24 0854 01/23/24 0904  BP: (!) 96/49 (!) 90/48  Pulse: 62 64  Resp: 20 18  Temp:    SpO2: 100% 100%    Last Pain:  Vitals:   01/23/24 0904  TempSrc:   PainSc: 0-No pain                 VAN STAVEREN,Delesha Pohlman

## 2024-02-03 NOTE — Progress Notes (Unsigned)
 PROVIDER NOTE: Interpretation of information contained herein should be left to medically-trained personnel. Specific patient instructions are provided elsewhere under Patient Instructions section of medical record. This document was created in part using AI and STT-dictation technology, any transcriptional errors that may result from this process are unintentional.  Patient: Danielle Park  Service: E/M   PCP: Abbey Bruckner, MD  DOB: [birth year]  DOS: 02/04/2024  Provider: Eric DELENA Como, MD  MRN: [id number]  Delivery: Face-to-face  Specialty: Interventional Pain Management  Type: Established Patient  Setting: Ambulatory outpatient facility  Specialty designation: 09  Referring Prov.: Bair, Bruckner, MD  Location: Outpatient office facility       History of present illness (HPI) Danielle Park, a 73 y.o. year old female, is here today because of her Chronic bilateral thoracic back pain [M54.6, G89.29]. Danielle Park primary complain today is No chief complaint on file.  Pertinent problems: Danielle Park has Chronic thoracic back pain (1ry area of Pain) (Intermittent) (Bilateral); Low back pain; Chronic pain syndrome; Myofascial pain syndrome of thoracic spine; Back spasm; DISH (diffuse idiopathic skeletal hyperostosis); Other intervertebral disc degeneration, thoracic region; Spasm of thoracic back muscle; Thoracic facet arthropathy (Multilevel) (Bilateral); Thoracic facet joint pain; Intractable low back pain; Multifactorial low back pain; Thoracolumbar back pain; Thoracic facet syndrome; and Spondylosis without myelopathy or radiculopathy, thoracic region on their pertinent problem list.  Pain Assessment: Severity of   is reported as a  /10. Location:    / . Onset:  . Quality:  . Timing:  . Modifying factor(s):  SABRA Vitals:  vitals were not taken for this visit.  BMI: Estimated body mass index is 28.86 kg/m as calculated from the following:   Height as of 01/23/24: 5' 2 (1.575 m).   Weight as  of 01/23/24: 157 lb 12.8 oz (71.6 kg).  Last encounter: 12/05/2023. Last procedure: 01/15/2024.  Reason for encounter: post-procedure evaluation and assessment.   Discussed the use of AI scribe software for clinical note transcription with the patient, who gave verbal consent to proceed.  History of Present Illness          Post-Procedure Evaluation   Procedure: Thoracic facet medial branch block w/ fluoroscopic mapping #1  Laterality: Bilateral (-50)  Level: T4, T5, T6, and T7 Medial Branch Level(s)  Imaging: Fluoroscopy-guided Spinal ([id number]) Anesthesia: Local anesthesia (1-2% Lidocaine ) Anxiolysis: IV Versed  2.5 mg Sedation: Minimal Sedation None required. No Fentanyl  administered.         DOS: 01/15/2024  Performed by: Eric DELENA Como, MD  Purpose: Diagnostic/Therapeutic Indications: Thoracic back pain severe enough to impact quality of life or function. Rationale (medical necessity): procedure needed and proper for the diagnosis and/or treatment of Danielle Park medical symptoms and needs. 1. Chronic thoracic back pain (1ry area of Pain) (Intermittent) (Bilateral)   2. Thoracic facet joint pain   3. Thoracolumbar back pain   4. Thoracic facet syndrome   5. Thoracic facet arthropathy (Multilevel) (Bilateral)   6. Spondylosis without myelopathy or radiculopathy, thoracic region   7. DISH (diffuse idiopathic skeletal hyperostosis)   8. Spasm of thoracic back muscle   9. Chronic anticoagulation (Plavix )    NAS-11 Pain score:   Pre-procedure: 2 /10   Post-procedure: 0-No pain/10     Effectiveness:  Initial hour after procedure:   ***. Subsequent 4-6 hours post-procedure:   ***. Analgesia past initial 6 hours:   ***. Ongoing improvement:  Analgesic:  *** Function:    ***    ROM:    ***  Pharmacotherapy Assessment   Opioid Analgesic: None MME/day: 0 mg/day   Monitoring: [place] PMP: PDMP reviewed during this encounter.       Pharmacotherapy: No side-effects or  adverse reactions reported. Compliance: No problems identified. Effectiveness: Clinically acceptable.  No notes on file  UDS:  No results found for: SUMMARY  No results found for: CBDTHCR No results found for: D8THCCBX No results found for: D9THCCBX  ROS  Constitutional: Denies any fever or chills Gastrointestinal: No reported hemesis, hematochezia, vomiting, or acute GI distress Musculoskeletal: Denies any acute onset joint swelling, redness, loss of ROM, or weakness Neurological: No reported episodes of acute onset apraxia, aphasia, dysarthria, agnosia, amnesia, paralysis, loss of coordination, or loss of consciousness  Medication Review  Dulaglutide , aspirin, baclofen , cholecalciferol, clopidogrel , dapagliflozin  propanediol, furosemide , levothyroxine , lisinopril , metoprolol  tartrate, rosuvastatin , and traZODone   History Review  Allergy: Danielle Park has no known allergies. Drug: Danielle Park  reports no history of drug use. Alcohol:  reports no history of alcohol use. Tobacco:  reports that she has never smoked. She has never used smokeless tobacco. Social: Danielle Park  reports that she has never smoked. She has never used smokeless tobacco. She reports that she does not drink alcohol and does not use drugs. Medical:  has a past medical history of Chicken pox, Congenital doubling of uterus, Congenital single kidney, Diabetes mellitus, Hypertension, Right ankle pain (04/18/2016), and UTI (urinary tract infection). Surgical: Danielle Park  has a past surgical history that includes Cesarean section; Vaginal delivery; Renal artery stent (2014); Cardiac catheterization (N/A, 01/11/2015); Cardiac catheterization (01/11/2015); Colonoscopy (N/A, 01/23/2024); and Polypectomy (01/23/2024). Family: family history includes Cancer in her father and sister; Cancer - Lung in her brother; Diabetes in her mother, sister, and another family member; Heart disease in her mother, sister, sister, and another  family member; Hypertension in her father and mother.  Laboratory Chemistry Profile   Renal Lab Results  Component Value Date   BUN 17 09/24/2023   CREATININE 1.26 (H) 09/24/2023   BCR 13 09/24/2023   GFR 39.92 (L) 09/19/2023   GFRAA >60 03/06/2015   GFRNONAA >60 03/06/2015    Hepatic Lab Results  Component Value Date   AST 20 09/24/2023   ALT 7 09/19/2023   ALBUMIN 4.2 09/24/2023   ALKPHOS 105 09/24/2023   LIPASE 42 03/06/2015    Electrolytes Lab Results  Component Value Date   NA 142 09/24/2023   K 4.6 09/24/2023   CL 104 09/24/2023   CALCIUM  9.8 09/24/2023   MG 2.4 (H) 09/24/2023   PHOS 3.5 03/29/2020    Bone Lab Results  Component Value Date   VD25OH 37.40 12/09/2018   25OHVITD1 40 09/24/2023   25OHVITD2 2.0 09/24/2023   25OHVITD3 38 09/24/2023    Inflammation (CRP: Acute Phase) (ESR: Chronic Phase) Lab Results  Component Value Date   CRP <1 09/24/2023   ESRSEDRATE 12 09/24/2023         Note: Above Lab results reviewed.  Recent Imaging Review  DG PAIN CLINIC C-ARM 1-60 MIN NO REPORT Fluoro was used, but no Radiologist interpretation will be provided.  Please refer to NOTES tab for provider progress note. Note: Reviewed        Physical Exam  Vitals: There were no vitals taken for this visit. BMI: Estimated body mass index is 28.86 kg/m as calculated from the following:   Height as of 01/23/24: 5' 2 (1.575 m).   Weight as of 01/23/24: 157 lb 12.8 oz (71.6 kg). Ideal: Ideal body weight:  50.1 kg (110 lb 7.2 oz) Adjusted ideal body weight: 58.7 kg (129 lb 6.2 oz) General appearance: Well nourished, well developed, and well hydrated. In no apparent acute distress Mental status: Alert, oriented x 3 (person, place, & time)       Respiratory: No evidence of acute respiratory distress Eyes: PERLA   Assessment   Diagnosis Status  1. Chronic thoracic back pain (1ry area of Pain) (Intermittent) (Bilateral)   2. Thoracic facet joint pain   3.  Thoracolumbar back pain   4. Thoracic facet syndrome   5. Postop check    Controlled Controlled Controlled   Updated Problems: No problems updated.  Plan of Care  Problem-specific:  Assessment and Plan            Danielle Park has a current medication list which includes the following long-term medication(s): furosemide , levothyroxine , lisinopril , metoprolol  tartrate, rosuvastatin , and trazodone .  Pharmacotherapy (Medications Ordered): No orders of the defined types were placed in this encounter.  Orders:  No orders of the defined types were placed in this encounter.    Interventional Therapies  Risk Factors  Considerations  Medical Comorbidities:  Plavix  Anticoagulation: (Stop: 7 days  Restart: 2 hours)  Hx. DVT  T2NIDDM w/ Stage III CKD  Single Kidney  Renal Artery Stenosis  HTN  DISH  PVD     Planned  Pending:   Diagnostic/therapeutic bilateral thoracic facet (T2-3, T3-4, and T4-5) MBB #1    Under consideration:   Diagnostic/therapeutic bilateral thoracic facet (T2-3, T3-4, and T4-5) MBB #1    Completed: (Analgesic benefit)1  None at this time   Therapeutic  Palliative (PRN) options:   None established   Completed by other providers:   None reported  1(Analgesic benefit): Expressed in percentage (%). (Local anesthetic[LA] +/- sedation  L.A.Local Anesthetic  Steroid benefit  Ongoing benefit)     No follow-ups on file.    Recent Visits Date Type Provider Dept  01/15/24 Procedure visit Tanya Glisson, MD [facility]  12/05/23 Office Visit Tanya Glisson, MD [facility]  11/07/23 Office Visit Tanya Glisson, MD [facility]  Showing recent visits within past 90 days and meeting all other requirements Future Appointments Date Type Provider Dept  02/04/24 Appointment Tanya Glisson, MD [facility]  Showing future appointments within next 90 days and meeting all other requirements  I  discussed the assessment and treatment plan with the patient. The patient was provided an opportunity to ask questions and all were answered. The patient agreed with the plan and demonstrated an understanding of the instructions.  Patient advised to call back or seek an in-person evaluation if the symptoms or condition worsens.  Duration of encounter: *** minutes.  Total time on encounter, as per AMA guidelines included both the face-to-face and non-face-to-face time personally spent by the physician and/or other qualified health care professional(s) on the day of the encounter (includes time in activities that require the physician or other qualified health care professional and does not include time in activities normally performed by clinical staff). Physician's time may include the following activities when performed: Preparing to see the patient (e.g., pre-charting review of records, searching for previously ordered imaging, lab work, and nerve conduction tests) Review of prior analgesic pharmacotherapies. Reviewing PMP Interpreting ordered tests (e.g., lab work, imaging, nerve conduction tests) Performing post-procedure evaluations, including interpretation of diagnostic procedures Obtaining and/or reviewing separately obtained history Performing a medically appropriate examination and/or evaluation Counseling and educating the patient/family/caregiver Ordering  medications, tests, or procedures Referring and communicating with other health care professionals (when not separately reported) Documenting clinical information in the electronic or other health record Independently interpreting results (not separately reported) and communicating results to the patient/ family/caregiver Care coordination (not separately reported)  Note by: Eric DELENA Como, MD (TTS and AI technology used. I apologize for any typographical errors that were not detected and corrected.) Date: 02/04/2024; Time:  3:06 PM

## 2024-02-04 ENCOUNTER — Encounter: Payer: Self-pay | Admitting: Pain Medicine

## 2024-02-04 ENCOUNTER — Ambulatory Visit: Attending: Pain Medicine | Admitting: Pain Medicine

## 2024-02-04 VITALS — BP 171/68 | HR 61 | Temp 97.3°F | Resp 16 | Ht 62.0 in | Wt 157.0 lb

## 2024-02-04 DIAGNOSIS — Z09 Encounter for follow-up examination after completed treatment for conditions other than malignant neoplasm: Secondary | ICD-10-CM | POA: Insufficient documentation

## 2024-02-04 DIAGNOSIS — G8929 Other chronic pain: Secondary | ICD-10-CM | POA: Diagnosis present

## 2024-02-04 DIAGNOSIS — M546 Pain in thoracic spine: Secondary | ICD-10-CM | POA: Diagnosis not present

## 2024-02-04 DIAGNOSIS — M47894 Other spondylosis, thoracic region: Secondary | ICD-10-CM | POA: Insufficient documentation

## 2024-02-04 DIAGNOSIS — M545 Low back pain, unspecified: Secondary | ICD-10-CM | POA: Insufficient documentation

## 2024-02-18 ENCOUNTER — Telehealth: Payer: Self-pay

## 2024-02-18 NOTE — Telephone Encounter (Signed)
 PAP: Patient assistance application for Trulicity  through [employer] has been mailed to pt's home address on file. Provider portion of application will be faxed to provider's office.patient is coming to [employer] office to sign application 02/19/24

## 2024-02-19 NOTE — Telephone Encounter (Signed)
 PAP: Application for Marcelline Deist has been submitted to [employer] via fax

## 2024-02-19 NOTE — Telephone Encounter (Signed)
 PAP: Application for Trulicity has been submitted to [employer], via fax

## 2024-02-19 NOTE — Telephone Encounter (Signed)
 Received patient portion PAP application for Trulicity [employer]).

## 2024-03-02 ENCOUNTER — Other Ambulatory Visit: Payer: Self-pay

## 2024-03-02 DIAGNOSIS — M62830 Muscle spasm of back: Secondary | ICD-10-CM

## 2024-03-03 NOTE — Telephone Encounter (Signed)
 1. Back spasm - baclofen  (LIORESAL ) 10 MG tablet; Take 0.5 tablets (5 mg total) by mouth 2 (two) times daily as needed for muscle spasms.  Dispense: 90 tablet; Refill: 0 Luke Shade, MD

## 2024-03-04 ENCOUNTER — Ambulatory Visit: Attending: Nurse Practitioner | Admitting: Nurse Practitioner

## 2024-03-04 ENCOUNTER — Encounter: Payer: Self-pay | Admitting: Nurse Practitioner

## 2024-03-04 ENCOUNTER — Telehealth: Payer: Self-pay | Admitting: Pain Medicine

## 2024-03-04 VITALS — BP 144/80 | HR 64 | Temp 97.2°F | Resp 18 | Ht 62.0 in | Wt 160.0 lb

## 2024-03-04 DIAGNOSIS — Z79899 Other long term (current) drug therapy: Secondary | ICD-10-CM | POA: Insufficient documentation

## 2024-03-04 DIAGNOSIS — M546 Pain in thoracic spine: Secondary | ICD-10-CM

## 2024-03-04 DIAGNOSIS — G8929 Other chronic pain: Secondary | ICD-10-CM

## 2024-03-04 MED ORDER — METHOCARBAMOL 1000 MG/10ML IJ SOLN
200.0000 mg | Freq: Once | INTRAMUSCULAR | Status: AC
  Administered 2024-03-04: 200 mg via INTRAMUSCULAR
  Filled 2024-03-04: qty 10

## 2024-03-04 NOTE — Progress Notes (Signed)
 Safety precautions to be maintained throughout the outpatient stay will include: orient to surroundings, keep bed in low position, maintain call bell within reach at all times, provide assistance with transfer out of bed and ambulation.

## 2024-03-04 NOTE — Telephone Encounter (Signed)
 Patient is having back spasms again. She is wanting to come in for norflex/ toradol shot? Says she wants to hold off on injections until after Christmas.

## 2024-03-04 NOTE — Progress Notes (Signed)
 PROVIDER NOTE: Interpretation of information contained herein should be left to medically-trained personnel. Specific patient instructions are provided elsewhere under Patient Instructions section of medical record. This document was created in part using AI and STT-dictation technology, any transcriptional errors that may result from this process are unintentional.  Patient: Danielle Park  Service: E/M Encounter  PCP: Bair, Kalpana, MD  DOB: April 14, 1950  DOS: 03/04/2024  Provider: Emmy MARLA Blanch, NP  MRN: [id number]  Delivery: Face-to-face  Specialty: Interventional Pain Management  Type: Established Patient  Setting: Ambulatory outpatient facility  Specialty designation: 09  Referring Prov.: Bair, Luke, MD  Location: Outpatient office facility       Purpose:  The patient comes in today for IM therapy. Case was discussed with attending physician.  Subjective:  [name] is a 73 y.o. year old, female patient, who comes today for a nurse visit complaining of Back Pain Her last contact with us  was on 03/04/2024. Severity of the pain is described as a 10-Worst pain ever/10.   Danielle Park, [place]  03/04/2024  1:01 PM  Sign when Signing Visit Safety precautions to be maintained throughout the outpatient stay will include: orient to surroundings, keep bed in low position, maintain call bell within reach at all times, provide assistance with transfer out of bed and ambulation.   Objective:  Danielle Park  height is 5' 2 (1.575 m) and weight is 160 lb (72.6 kg). Her temporal temperature is 97.2 F (36.2 C) (abnormal). Her blood pressure is 144/80 (abnormal) and her pulse is 64. Her respiration is 18 and oxygen saturation is 100%.  Body mass index is 29.26 kg/m.  Analgesic:  Allergies:  Patient has no known allergies.  Labs:  Lab Results  Component Value Date   BUN 17 09/24/2023   CREATININE 1.26 (H) 09/24/2023   GFRAA >60 03/06/2015   GFRNONAA >60 03/06/2015    Assessment:  The  primary encounter diagnosis was Chronic thoracic back pain (1ry area of Pain) (Intermittent) (Bilateral). Diagnoses of Thoracic facet joint pain and Pharmacologic therapy were also pertinent to this visit.  Attestation: Medical screening examination/treatment/procedure(s) were performed by non-physician practitioner and as supervising physician I was immediately available for consultation/collaboration.  Plan of Care (POC)  Orders:  No orders of the defined types were placed in this encounter.    Medications ordered for procedure: Meds ordered this encounter  Medications   methocarbamol  (ROBAXIN ) injection 200 mg   Medications administered: We administered methocarbamol .  See the medical record for exact dosing, route, and time of administration.        Follow-up plan:   No follow-ups on file.     Recent Visits Date Type Provider Dept  02/04/24 Office Visit Tanya Glisson, MD [facility]  01/15/24 Procedure visit Tanya Glisson, MD [facility]  12/05/23 Office Visit Tanya Glisson, MD [facility]  Showing recent visits within past 90 days and meeting all other requirements Today's Visits Date Type Provider Dept  03/04/24 Office Visit Zaila Crew K, NP [facility]  Showing today's visits and meeting all other requirements Future Appointments No visits were found meeting these conditions. Showing future appointments within next 90 days and meeting all other requirements   Disposition: Discharge home  Discharge (Date  Time): 03/04/2024; 1301 hrs.   Primary Care Physician: Bair, Kalpana, MD Location: [facility] Note by: Korvin Valentine K Ola Fawver, NP (TTS technology used. I apologize for any typographical errors that were not detected  and corrected.) Date: 03/04/2024; Time: 1:04 PM  Disclaimer:  Medicine is not an visual merchandiser. The only guarantee in medicine is that nothing is guaranteed. It is  important to note that the decision to proceed with this intervention was based on the information collected from the patient. The Data and conclusions were drawn from the patient's questionnaire, the interview, and the physical examination. Because the information was provided in large part by the patient, it cannot be guaranteed that it has not been purposely or unconsciously manipulated. Every effort has been made to obtain as much relevant data as possible for this evaluation. It is important to note that the conclusions that lead to this procedure are derived in large part from the available data. Always take into account that the treatment will also be dependent on availability of resources and existing treatment guidelines, considered by other Pain Management Practitioners as being common knowledge and practice, at the time of the intervention. For Medico-Legal purposes, it is also important to point out that variation in procedural techniques and pharmacological choices are the acceptable norm. The indications, contraindications, technique, and results of the above procedure should only be interpreted and judged by a Board-Certified Interventional Pain Specialist with extensive familiarity and expertise in the same exact procedure and technique.

## 2024-03-06 ENCOUNTER — Other Ambulatory Visit: Payer: Self-pay

## 2024-03-06 DIAGNOSIS — E039 Hypothyroidism, unspecified: Secondary | ICD-10-CM

## 2024-03-10 ENCOUNTER — Telehealth: Payer: Self-pay | Admitting: Pain Medicine

## 2024-03-10 ENCOUNTER — Encounter: Payer: Self-pay | Admitting: Pain Medicine

## 2024-03-10 ENCOUNTER — Ambulatory Visit: Attending: Pain Medicine | Admitting: Pain Medicine

## 2024-03-10 VITALS — BP 192/62 | HR 58 | Temp 97.2°F | Resp 14 | Ht 62.0 in | Wt 160.0 lb

## 2024-03-10 DIAGNOSIS — M546 Pain in thoracic spine: Secondary | ICD-10-CM | POA: Diagnosis present

## 2024-03-10 DIAGNOSIS — M4810 Ankylosing hyperostosis [Forestier], site unspecified: Secondary | ICD-10-CM

## 2024-03-10 DIAGNOSIS — Z7901 Long term (current) use of anticoagulants: Secondary | ICD-10-CM | POA: Insufficient documentation

## 2024-03-10 DIAGNOSIS — M62830 Muscle spasm of back: Secondary | ICD-10-CM

## 2024-03-10 DIAGNOSIS — G8929 Other chronic pain: Secondary | ICD-10-CM | POA: Diagnosis present

## 2024-03-10 DIAGNOSIS — M7918 Myalgia, other site: Secondary | ICD-10-CM

## 2024-03-10 NOTE — Patient Instructions (Addendum)
 ______________________________________________________________________    Muscle Spasms & Cramps  Cause(s):  Most common - vitamin and/or electrolyte (calcium , potassium, sodium, etc.) deficiencies. Post procedure - steroids (injected, oral, or inhaled) can make your kidneys excrete (loose) electrolytes. Most of the time this will not cause any symptoms however, if you happen to be borderline low on your electrolytes, it may temporarily triggering cramps & spasms.  Possible triggers: Sweating - causes loss of electrolytes thru the skin. Steroids - causes loss of electrolytes thru the urine.  Treatment: (over-the-counter)  Gatorade (or any other electrolyte-replenishing drink) - Take 1, 8 oz glass with each meal (3 times a day). Mechanism of action: Replenishes lost electrolytes. Magnesium 400 to 500 mg - Take 1 tablet twice a day (one with breakfast and one at bedtime). If you have kidney disease talk to your primary care physician before taking any Magnesium. Mechanism of action: Magnesium is a natural muscle relaxant. Tonic Water with quinine - Take 1, 8 oz glass before bedtime.  Mechanism of action: Quinine is used to treat spasms.  Last Update: 10/05/2022  ______________________________________________________________________     ______________________________________________________________________    Blood Thinners  IMPORTANT NOTICE:  If you take any of these, make sure to notify the nursing staff.  Failure to do so may result in serious injury.  Recommended time intervals to stop and restart blood-thinners, before & after invasive procedures  Generic Name Brand Name Pre-procedure: Stop medication for this amount of time before your procedure: Post-procedure: Wait this amount of time after the procedure before restarting your medication:  Abciximab Reopro 15 days 2 hrs  Alteplase Activase 10 days 10 days  Anagrelide Agrylin    Apixaban Eliquis 3 days 6 hrs  Cilostazol  Pletal 3 days 5 hrs  Clopidogrel  Plavix  7-10 days 2 hrs  Dabigatran Pradaxa 5 days 6 hrs  Dalteparin Fragmin 24 hours 4 hrs  Dipyridamole Aggrenox 11days 2 hrs  Edoxaban Lixiana; Savaysa 3 days 2 hrs  Enoxaparin  Lovenox 24 hours 4 hrs  Eptifibatide Integrillin 8 hours 2 hrs  Fondaparinux  Arixtra 72 hours 12 hrs  Hydroxychloroquine Plaquenil 11 days   Prasugrel Effient 7-10 days 6 hrs  Reteplase Retavase 10 days 10 days  Rivaroxaban Xarelto 3 days 6 hrs  Ticagrelor Brilinta 5-7 days 6 hrs  Ticlopidine Ticlid 10-14 days 2 hrs  Tinzaparin Innohep 24 hours 4 hrs  Tirofiban Aggrastat 8 hours 2 hrs  Warfarin Coumadin 5 days 2 hrs   Other medications with blood-thinning effects  NOTE: Consider stopping these if you have prolonged bleeding despite not taking any of the above blood thinners. Otherwise ask your provider and this will be decided on a case-by-case basis.  Product indications Generic (Brand) names Note  Cholesterol Lipitor Stop 4 days before procedure  Blood thinner (injectable) Heparin  (LMW or LMWH Heparin ) Stop 24 hours before procedure  Cancer Ibrutinib (Imbruvica) Stop 7 days before procedure  Malaria/Rheumatoid Hydroxychloroquine (Plaquenil) Stop 11 days before procedure  Thrombolytics  10 days before or after procedures   Over-the-counter (OTC) Products with blood-thinning effects  NOTE: Consider stopping these if you have prolonged bleeding despite not taking any of the above blood thinners. Otherwise ask your provider and this will be decided on a case-by-case basis.  Product Common names Stop Time  Aspirin > 325 mg Goody Powders, Excedrin, etc. 11 days  Aspirin <= 81 mg  7 days  Fish oil  4 days  Garlic supplements  7 days  Ginkgo biloba  36 hours  Ginseng  24 hours  NSAIDs Ibuprofen, Naprosyn, etc. 3 days  Vitamin E  4 days   ______________________________________________________________________      ______________________________________________________________________    Procedure instructions  Stop blood-thinners  Do not eat or drink fluids (other than water) for 6 hours before your procedure  No water for 2 hours before your procedure  Take your blood pressure medicine with a sip of water  Arrive 30 minutes before your appointment  If sedation is planned, bring suitable driver. [place], [place], & public transportation are NOT APPROVED)  Carefully read the Preparing for your procedure detailed instructions  If you have questions call us  at [phone]  Procedure appointments are for procedures only.   NO medication refills or new problem evaluations will be done on procedure days.   Only the scheduled, pre-approved procedure and side will be done.   ______________________________________________________________________     ______________________________________________________________________    Preparing for your procedure  Appointments: If you think you may not be able to keep your appointment, call 24-48 hours in advance to cancel. We need time to make it available to others.  Procedure visits are for procedures only. During your procedure appointment there will be: NO Prescription Refills*. NO medication changes or discussions*. NO discussion of disability issues*. NO unrelated pain problem evaluations*. NO evaluations to order other pain procedures*. *These will be addressed at a separate and distinct evaluation encounter on the provider's evaluation schedule and not during procedure days.  Instructions: Food intake: Avoid eating anything solid for at least 8 hours prior to your procedure. Clear liquid intake: You may take clear liquids such as water up to 2 hours prior to your procedure. (No carbonated drinks. No soda.) Transportation: Unless otherwise stated by your physician, bring a driver. (Driver cannot be a Market Researcher, Pharmacist, Community, or any other form of public  transportation.) Morning Medicines: Except for blood thinners, take all of your other morning medications with a sip of water. Make sure to take your heart and blood pressure medicines. If your blood pressure's lower number is above 100, the case will be rescheduled. Blood thinners: Make sure to stop your blood thinners as instructed.  If you take a blood thinner, but were not instructed to stop it, call our office [phone] and ask to talk to a nurse. Not stopping a blood thinner prior to certain procedures could lead to serious complications. Diabetics on insulin : Notify the staff so that you can be scheduled 1st case in the morning. If your diabetes requires high dose insulin , take only  of your normal insulin  dose the morning of the procedure and notify the staff that you have done so. Preventing infections: Shower with an antibacterial soap the morning of your procedure.  Build-up your immune system: Take 1000 mg of Vitamin C with every meal (3 times a day) the day prior to your procedure. Antibiotics: Inform the nursing staff if you are taking any antibiotics or if you have any conditions that may require antibiotics prior to procedures. (Example: recent joint implants)   Pregnancy: If you are pregnant make sure to notify the nursing staff. Not doing so may result in injury to the fetus, including death.  Sickness: If you have a cold, fever, or any active infections, call and cancel or reschedule your procedure. Receiving steroids while having an infection may result in complications. Arrival: You must be in the facility at least 30 minutes prior to your scheduled procedure. Tardiness: Your scheduled time is also the cutoff time. If you  do not arrive at least 15 minutes prior to your procedure, you will be rescheduled.  Children: Do not bring any children with you. Make arrangements to keep them home. Dress appropriately: There is always a possibility that your clothing may get soiled. Avoid  long dresses. Valuables: Do not bring any jewelry or valuables.  Reasons to call and reschedule or cancel your procedure: (Following these recommendations will minimize the risk of a serious complication.) Surgeries: Avoid having procedures within 2 weeks of any surgery. (Avoid for 2 weeks before or after any surgery). Flu Shots: Avoid having procedures within 2 weeks of a flu shots or . (Avoid for 2 weeks before or after immunizations). Barium: Avoid having a procedure within 7-10 days after having had a radiological study involving the use of radiological contrast. (Myelograms, Barium swallow or enema study). Heart attacks: Avoid any elective procedures or surgeries for the initial 6 months after a Myocardial Infarction (Heart Attack). Blood thinners: It is imperative that you stop these medications before procedures. Let us  know if you if you take any blood thinner.  Infection: Avoid procedures during or within two weeks of an infection (including chest colds or gastrointestinal problems). Symptoms associated with infections include: Localized redness, fever, chills, night sweats or profuse sweating, burning sensation when voiding, cough, congestion, stuffiness, runny nose, sore throat, diarrhea, nausea, vomiting, cold or Flu symptoms, recent or current infections. It is specially important if the infection is over the area that we intend to treat. Heart and lung problems: Symptoms that may suggest an active cardiopulmonary problem include: cough, chest pain, breathing difficulties or shortness of breath, dizziness, ankle swelling, uncontrolled high or unusually low blood pressure, and/or palpitations. If you are experiencing any of these symptoms, cancel your procedure and contact your primary care physician for an evaluation.  Remember:  Regular Business hours are:  Monday to Thursday 8:00 AM to 4:00 PM  Provider's Schedule: Eric Como, MD:  Procedure days: Tuesday and Thursday 7:30  AM to 4:00 PM  Wallie Sherry, MD:  Procedure days: Monday and Wednesday 7:30 AM to 4:00 PM Last  Updated: 03/06/2023 ______________________________________________________________________     ______________________________________________________________________    General Risks and Possible Complications  Patient Responsibilities: It is important that you read this as it is part of your informed consent. It is our duty to inform you of the risks and possible complications associated with treatments offered to you. It is your responsibility as a patient to read this and to ask questions about anything that is not clear or that you believe was not covered in this document.  Patients Rights: You have the right to refuse treatment. You also have the right to change your mind, even after initially having agreed to have the treatment done. However, under this last option, if you wait until the last second to change your mind, you may be charged for the materials used up to that point.  Introduction: Medicine is not an visual merchandiser. Everything in Medicine, including the lack of treatment(s), carries the potential for danger, harm, or loss (which is by definition: Risk). In Medicine, a complication is a secondary problem, condition, or disease that can aggravate an already existing one. All treatments carry the risk of possible complications. The fact that a side effects or complications occurs, does not imply that the treatment was conducted incorrectly. It must be clearly understood that these can happen even when everything is done following the highest safety standards.  No treatment: You can choose not to  proceed with the proposed treatment alternative. The PRO(s) would include: avoiding the risk of complications associated with the therapy. The CON(s) would include: not getting any of the treatment benefits. These benefits fall under one of three categories: diagnostic; therapeutic; and/or  palliative. Diagnostic benefits include: getting information which can ultimately lead to improvement of the disease or symptom(s). Therapeutic benefits are those associated with the successful treatment of the disease. Finally, palliative benefits are those related to the decrease of the primary symptoms, without necessarily curing the condition (example: decreasing the pain from a flare-up of a chronic condition, such as incurable terminal cancer).  General Risks and Complications: These are associated to most interventional treatments. They can occur alone, or in combination. They fall under one of the following six (6) categories: no benefit or worsening of symptoms; bleeding; infection; nerve damage; allergic reactions; and/or death. No benefits or worsening of symptoms: In Medicine there are no guarantees, only probabilities. No healthcare provider can ever guarantee that a medical treatment will work, they can only state the probability that it may. Furthermore, there is always the possibility that the condition may worsen, either directly, or indirectly, as a consequence of the treatment. Bleeding: This is more common if the patient is taking a blood thinner, either prescription or over the counter (example: Goody Powders, Fish oil, Aspirin, Garlic, etc.), or if suffering a condition associated with impaired coagulation (example: Hemophilia, cirrhosis of the liver, low platelet counts, etc.). However, even if you do not have one on these, it can still happen. If you have any of these conditions, or take one of these drugs, make sure to notify your treating physician. Infection: This is more common in patients with a compromised immune system, either due to disease (example: diabetes, cancer, human immunodeficiency virus [HIV], etc.), or due to medications or treatments (example: therapies used to treat cancer and rheumatological diseases). However, even if you do not have one on these, it can still  happen. If you have any of these conditions, or take one of these drugs, make sure to notify your treating physician. Nerve Damage: This is more common when the treatment is an invasive one, but it can also happen with the use of medications, such as those used in the treatment of cancer. The damage can occur to small secondary nerves, or to large primary ones, such as those in the spinal cord and brain. This damage may be temporary or permanent and it may lead to impairments that can range from temporary numbness to permanent paralysis and/or brain death. Allergic Reactions: Any time a substance or material comes in contact with our body, there is the possibility of an allergic reaction. These can range from a mild skin rash (contact dermatitis) to a severe systemic reaction (anaphylactic reaction), which can result in death. Death: In general, any medical intervention can result in death, most of the time due to an unforeseen complication. ______________________________________________________________________      ______________________________________________________________________    Steroid injections  Common steroids for injections Triamcinolone: Used by many sports medicine physicians for large joint and bursal injections, often combined with a local anesthetic like lidocaine . A study focusing on coccydynia (tailbone pain) found triamcinolone was more effective than betamethasone, suggesting it may also be preferable for other localized inflammation conditions. Methylprednisolone: A common alternative to triamcinolone that is also a strong anti-inflammatory. It is available in different formulations, with the acetate suspension being the long-acting option for intra-articular injections. Dexamethasone : This is a non-particulate steroid, meaning it  has a lower risk of tissue damage compared to particulate steroids like triamcinolone and methylprednisolone. While less common for this specific  use, it is an option for targeted injections.   Considerations for physicians Particulate vs. non-particulate steroids: Triamcinolone and methylprednisolone are particulate, meaning they can clump together. Dexamethasone  is non-particulate. Particulate steroids are often preferred for their longer-lasting effects but carry a theoretical higher risk for certain injections (though this is less of a concern in the costochondral joints). Combined injectate: Corticosteroids are typically mixed with a local anesthetic like lidocaine  to provide both immediate pain relief (from the anesthetic) and longer-term inflammation reduction (from the steroid). Imaging guidance: To ensure accurate placement of the needle and medication, physicians may use ultrasound or fluoroscopic guidance for the injection, especially in complex or refractory cases.   Patient guidance Before undergoing a steroid injection, discuss the options with your physician. They will determine the best steroid, dosage, and procedure for your specific case based on factors like: Severity of your condition History of response to other treatments Your overall health status Experience and preference of the physician  Last  Updated: 11/20/2023 ______________________________________________________________________

## 2024-03-10 NOTE — Progress Notes (Signed)
 PROVIDER NOTE: Interpretation of information contained herein should be left to medically-trained personnel. Specific patient instructions are provided elsewhere under Patient Instructions section of medical record. This document was created in part using AI and STT-dictation technology, any transcriptional errors that may result from this process are unintentional.  Patient: Danielle Park  Service: E/M   PCP: Danielle Bruckner, MD  DOB: 1950/11/15  DOS: 03/10/2024  Provider: Eric DELENA Como, MD  MRN: [id number]  Delivery: Face-to-face  Specialty: Interventional Pain Management  Type: Established Patient  Setting: Ambulatory outpatient facility  Specialty designation: 09  Referring Prov.: Danielle Park, Bruckner, MD  Location: Outpatient office facility       History of present illness (HPI) Danielle Park, a 73 y.o. year old female, is here today because of her Myofascial pain syndrome of thoracic spine [M79.18]. Danielle Park primary complain today is Back Pain (Left, upper)  Pertinent problems: Danielle Park has Chronic thoracic back pain (1ry area of Pain) (Intermittent) (Bilateral); Low back pain; Chronic pain syndrome; Myofascial pain syndrome of thoracic spine; Back spasm; DISH (diffuse idiopathic skeletal hyperostosis); Other intervertebral disc degeneration, thoracic region; Spasm of thoracic back muscle; Thoracic facet arthropathy (Multilevel) (Bilateral); Thoracic facet joint pain; Intractable low back pain; Multifactorial low back pain; Thoracolumbar back pain; Thoracic facet syndrome; and Spondylosis without myelopathy or radiculopathy, thoracic region on their pertinent problem list.  Pain Assessment: Severity of Chronic pain is reported as a 10-Worst pain ever/10. Location: Back Left, Upper/denies. Onset: More than a month ago. Quality: Spasm. Timing: Intermittent. Modifying factor(s): Baclofen , Tylenol. Vitals:  height is 5' 2 (1.575 m) and weight is 160 lb (72.6 kg). Her temporal temperature is  97.2 F (36.2 C) (abnormal). Her blood pressure is 192/62 (abnormal) and her pulse is 58 (abnormal). Her respiration is 14 and oxygen saturation is 100%.  BMI: Estimated body mass index is 29.26 kg/m as calculated from the following:   Height as of this encounter: 5' 2 (1.575 m).   Weight as of this encounter: 160 lb (72.6 kg).  Last encounter: 03/10/2024. Last procedure: 03/10/2024.  Reason for encounter: evaluation of worsening, or previously known (established) problem.  The patient returns to the clinic today indicating that she has been experiencing muscle spasms in the upper back.  On 01/15/2024 she had a diagnostic bilateral thoracic facet block targeting the T4-T7 medial branches.  She indicated having attained 100% relief of the pain for the duration of local anesthetic followed by a 75% improvement of her symptoms that seem to be ongoing at the time of her follow-up evaluation on 02/04/2024.  She has been experiencing some spasms that she indicated lasted for approximately 5 days.  On follow-up evaluation on 03/04/2024 she came in and she had a methocarbamol  (Robaxin ) IM injection of 200 mg which seem to have provided her with some relief of the pain.  Today she returns indicating that the spasms are back.  She takes baclofen  5 mg twice a day.  She is also on Crestor  10 mg daily.  The patient has type 2 diabetes and stage III chronic kidney disease secondary to the diabetes.  She is also on Plavix  anticoagulation.  Discussed the use of AI scribe software for clinical note transcription with the patient, who gave verbal consent to proceed.  History of Present Illness   Danielle Park is a 73 year old female who presents with recurrent back spasms on the left side.  She has had recurrent left-sided back spasms since last Sunday, previously  located on the right at the bra line. Recent muscle relaxer injection on Tuesday gave partial but incomplete relief. A prior diagnostic facet block  relieved severe right-sided spasms for about two months. She takes baclofen , which causes sleepiness and dizziness and does not fully control the spasms.       Pharmacotherapy Assessment   Opioid Analgesic: None MME/day: 0 mg/day   Monitoring: [place] PMP: PDMP not reviewed this encounter.       Pharmacotherapy: No side-effects or adverse reactions reported. Compliance: No problems identified. Effectiveness: Clinically acceptable.  No notes on file  UDS:  No results found for: SUMMARY  No results found for: CBDTHCR No results found for: D8THCCBX No results found for: D9THCCBX  ROS  Constitutional: Denies any fever or chills Gastrointestinal: No reported hemesis, hematochezia, vomiting, or acute GI distress Musculoskeletal: Denies any acute onset joint swelling, redness, loss of ROM, or weakness Neurological: No reported episodes of acute onset apraxia, aphasia, dysarthria, agnosia, amnesia, paralysis, loss of coordination, or loss of consciousness  Medication Review  Dulaglutide , aspirin, baclofen , cholecalciferol, clopidogrel , dapagliflozin  propanediol, furosemide , levothyroxine , lisinopril , metoprolol  tartrate, rosuvastatin , and traZODone   History Review  Allergy: Danielle Park has no known allergies. Drug: Danielle Park  reports no history of drug use. Alcohol:  reports no history of alcohol use. Tobacco:  reports that she has never smoked. She has never used smokeless tobacco. Social: Danielle Park  reports that she has never smoked. She has never used smokeless tobacco. She reports that she does not drink alcohol and does not use drugs. Medical:  has a past medical history of Chicken pox, Congenital doubling of uterus, Congenital single kidney, Diabetes mellitus, Hypertension, Right ankle pain (04/18/2016), and UTI (urinary tract infection). Surgical: Danielle Park  has a past surgical history that includes Cesarean section; Vaginal delivery; Renal artery stent (2014); Cardiac  catheterization (N/A, 01/11/2015); Cardiac catheterization (01/11/2015); Colonoscopy (N/A, 01/23/2024); and Polypectomy (01/23/2024). Family: family history includes Cancer in her father and sister; Cancer - Lung in her brother; Diabetes in her mother, sister, and another family member; Heart disease in her mother, sister, sister, and another family member; Hypertension in her father and mother.  Laboratory Chemistry Profile   Renal Lab Results  Component Value Date   BUN 17 09/24/2023   CREATININE 1.26 (H) 09/24/2023   BCR 13 09/24/2023   GFR 39.92 (L) 09/19/2023   GFRAA >60 03/06/2015   GFRNONAA >60 03/06/2015    Hepatic Lab Results  Component Value Date   AST 20 09/24/2023   ALT 7 09/19/2023   ALBUMIN 4.2 09/24/2023   ALKPHOS 105 09/24/2023   LIPASE 42 03/06/2015    Electrolytes Lab Results  Component Value Date   NA 142 09/24/2023   K 4.6 09/24/2023   CL 104 09/24/2023   CALCIUM  9.8 09/24/2023   MG 2.4 (H) 09/24/2023   PHOS 3.5 03/29/2020    Bone Lab Results  Component Value Date   VD25OH 37.40 12/09/2018   25OHVITD1 40 09/24/2023   25OHVITD2 2.0 09/24/2023   25OHVITD3 38 09/24/2023    Inflammation (CRP: Acute Phase) (ESR: Chronic Phase) Lab Results  Component Value Date   CRP <1 09/24/2023   ESRSEDRATE 12 09/24/2023         Note: Above Lab results reviewed.  Recent Imaging Review  DG PAIN CLINIC C-ARM 1-60 MIN NO REPORT Fluoro was used, but no Radiologist interpretation will be provided.  Please refer to NOTES tab for provider progress note. Note: Reviewed  Physical Exam  Vitals: BP (!) 192/62 (Cuff Size: Normal)   Pulse (!) 58   Temp (!) 97.2 F (36.2 C) (Temporal)   Resp 14   Ht 5' 2 (1.575 m)   Wt 160 lb (72.6 kg)   SpO2 100%   BMI 29.26 kg/m  BMI: Estimated body mass index is 29.26 kg/m as calculated from the following:   Height as of this encounter: 5' 2 (1.575 m).   Weight as of this encounter: 160 lb (72.6 kg). Ideal: Ideal  body weight: 50.1 kg (110 lb 7.2 oz) Adjusted ideal body weight: 59.1 kg (130 lb 4.3 oz) General appearance: Well nourished, well developed, and well hydrated. In no apparent acute distress Mental status: Alert, oriented x 3 (person, place, & time)       Respiratory: No evidence of acute respiratory distress Eyes: PERLA   Assessment   Diagnosis Status  1. Myofascial pain syndrome of thoracic spine   2. DISH (diffuse idiopathic skeletal hyperostosis)   3. Chronic thoracic back pain (1ry area of Pain) (Intermittent) (Bilateral)   4. Back spasm   5. Spasm of thoracic back muscle   6. Chronic anticoagulation (Plavix )    Controlled Controlled Controlled   Updated Problems: No problems updated.  Plan of Care  Problem-specific:  Assessment and Plan    Chronic thoracic back pain with muscle spasm   She experiences chronic thoracic back pain with muscle spasms on the left side, previously on the right. Spasms began a week ago. A previous muscle relaxer injection provided temporary relief, and a diagnostic facet block previously relieved symptoms for two months. Baclofen  causes drowsiness without effectively stopping spasms. Facet blocks have been the most effective treatment. Stop blood thinners prior to injection. Administer a second diagnostic facet block. Consider radiofrequency ablation for longer-lasting relief if the facet block is effective.  Chronic anticoagulation therapy   Management is required prior to the diagnostic facet block procedure. Stop blood thinners prior to injection.       Danielle Park has a current medication list which includes the following long-term medication(s): furosemide , levothyroxine , lisinopril , metoprolol  tartrate, rosuvastatin , and trazodone .  Pharmacotherapy (Medications Ordered): No orders of the defined types were placed in this encounter.  Orders:  Orders Placed This Encounter  Procedures   THORACIC FACET BLOCK    Standing Status:    Future    Expiration Date:   06/08/2024    Scheduling Instructions:     Thoracic Medial Branch Block (T4-T7)     Side: Bilateral     Sedation: With Sedation.     Timeframe: ASAP    Where will this procedure be performed?:   [facility]   Blood Thinner Instructions to Nursing    Always make sure patient has clearance from prescribing physician to stop blood thinners for interventional therapies. If the patient requires a Lovenox-bridge therapy, make sure arrangements are made to institute it with the assistance of the PCP.    Scheduling Instructions:     Have Danielle Park stop the Plavix  (Clopidogrel ) x 7-10 days prior to procedure or surgery.     Interventional Therapies  Risk Factors  Considerations  Medical Comorbidities:  Plavix  Anticoagulation: (Stop: 7 days  Restart: 2 hours)  Hx. DVT  T2NIDDM w/ Stage III CKD  Single Kidney  Renal Artery Stenosis  HTN  DISH  PVD     Planned  Pending:   Diagnostic/therapeutic bilateral thoracic facet (T4-T7) MBB #2    Under consideration:  Diagnostic/therapeutic bilateral thoracic facet (T4-T7) MBB #2 with possible RFA follow-up   Completed: (Analgesic benefit)1  Diagnostic/therapeutic bilateral thoracic facet MBB x1 (01/15/2024) (100/100/100/75) (stopped spasms x 43mo)   Therapeutic  Palliative (PRN) options:   None established   Completed by other providers:   None reported  1(Analgesic benefit): Expressed in percentage (%). (Local anesthetic[LA] +/- sedation  L.A.Local Anesthetic  Steroid benefit  Ongoing benefit)     Return for (ECT):(B) T-FCT Blk (T4-T7) #2, (Blood Thinner Protocol).    Recent Visits Date Type Provider Dept  03/04/24 Office Visit Patel, Seema K, NP [facility]  02/04/24 Office Visit Tanya Glisson, MD [facility]  01/15/24 Procedure visit Tanya Glisson, MD [facility]  Showing recent visits within past 90 days and meeting all other requirements Today's  Visits Date Type Provider Dept  03/10/24 Office Visit Tanya Glisson, MD [facility]  Showing today's visits and meeting all other requirements Future Appointments Date Type Provider Dept  04/01/24 Appointment Tanya Glisson, MD [facility]  Showing future appointments within next 90 days and meeting all other requirements  I discussed the assessment and treatment plan with the patient. The patient was provided an opportunity to ask questions and all were answered. The patient agreed with the plan and demonstrated an understanding of the instructions.  Patient advised to call back or seek an in-person evaluation if the symptoms or condition worsens.  Duration of encounter: 30 minutes.  Total time on encounter, as per AMA guidelines included both the face-to-face and non-face-to-face time personally spent by the physician and/or other qualified health care professional(s) on the day of the encounter (includes time in activities that require the physician or other qualified health care professional and does not include time in activities normally performed by clinical staff). Physician's time may include the following activities when performed: Preparing to see the patient (e.g., pre-charting review of records, searching for previously ordered imaging, lab work, and nerve conduction tests) Review of prior analgesic pharmacotherapies. Reviewing PMP Interpreting ordered tests (e.g., lab work, imaging, nerve conduction tests) Performing post-procedure evaluations, including interpretation of diagnostic procedures Obtaining and/or reviewing separately obtained history Performing a medically appropriate examination and/or evaluation Counseling and educating the patient/family/caregiver Ordering medications, tests, or procedures Referring and communicating with other health care professionals (when not separately reported) Documenting clinical information in the  electronic or other health record Independently interpreting results (not separately reported) and communicating results to the patient/ family/caregiver Care coordination (not separately reported)  Note by: Glisson DELENA Tanya, MD (TTS and AI technology used. I apologize for any typographical errors that were not detected and corrected.) Date: 03/10/2024; Time: 11:45 AM

## 2024-03-10 NOTE — Telephone Encounter (Signed)
 Still having spasms, is there anything to help with this.

## 2024-03-12 NOTE — Telephone Encounter (Signed)
 PAP: Patient assistance application for Farxiga  has been approved by PAP Companies: [employer] from 03/27/2024 to 03/26/2025. Medication should be delivered to PAP Delivery: Home. For further shipping updates, please contact [employer] ([employer]) at [phone]. Patient ID is: not provided.

## 2024-03-21 NOTE — Telephone Encounter (Signed)
 PAP: Patient assistance application for Trulicity  has been approved by PAP Companies: [name] from 03/27/2024 to 03/26/2025. Medication should be delivered to PAP Delivery: Provider's office. For further shipping updates, please contact [name] at [phone]. Patient ID is: [id number]

## 2024-03-24 ENCOUNTER — Ambulatory Visit

## 2024-04-01 ENCOUNTER — Ambulatory Visit: Admitting: Pain Medicine

## 2024-04-02 ENCOUNTER — Ambulatory Visit: Admitting: Pain Medicine

## 2024-04-03 ENCOUNTER — Ambulatory Visit

## 2024-04-03 VITALS — BP 150/98 | HR 71 | Temp 97.7°F | Ht 62.0 in | Wt 166.4 lb

## 2024-04-03 DIAGNOSIS — E785 Hyperlipidemia, unspecified: Secondary | ICD-10-CM | POA: Diagnosis not present

## 2024-04-03 DIAGNOSIS — E1169 Type 2 diabetes mellitus with other specified complication: Secondary | ICD-10-CM

## 2024-04-03 DIAGNOSIS — E039 Hypothyroidism, unspecified: Secondary | ICD-10-CM | POA: Diagnosis not present

## 2024-04-03 DIAGNOSIS — E1159 Type 2 diabetes mellitus with other circulatory complications: Secondary | ICD-10-CM

## 2024-04-03 DIAGNOSIS — Z7984 Long term (current) use of oral hypoglycemic drugs: Secondary | ICD-10-CM

## 2024-04-03 DIAGNOSIS — I150 Renovascular hypertension: Secondary | ICD-10-CM | POA: Diagnosis not present

## 2024-04-03 DIAGNOSIS — I152 Hypertension secondary to endocrine disorders: Secondary | ICD-10-CM | POA: Insufficient documentation

## 2024-04-03 DIAGNOSIS — Z7985 Long-term (current) use of injectable non-insulin antidiabetic drugs: Secondary | ICD-10-CM | POA: Diagnosis not present

## 2024-04-03 DIAGNOSIS — M62830 Muscle spasm of back: Secondary | ICD-10-CM | POA: Diagnosis not present

## 2024-04-03 DIAGNOSIS — E1122 Type 2 diabetes mellitus with diabetic chronic kidney disease: Secondary | ICD-10-CM | POA: Diagnosis not present

## 2024-04-03 DIAGNOSIS — F5102 Adjustment insomnia: Secondary | ICD-10-CM

## 2024-04-03 DIAGNOSIS — N1832 Chronic kidney disease, stage 3b: Secondary | ICD-10-CM | POA: Diagnosis not present

## 2024-04-03 MED ORDER — TELMISARTAN 40 MG PO TABS: 40.0000 mg | ORAL_TABLET | Freq: Every day | ORAL | 3 refills | Status: AC

## 2024-04-03 NOTE — Assessment & Plan Note (Addendum)
 Continue Farxiga  10 mg daily, Trulicity  4.5 mg once weekly injection.  Repeat A1c, future lab ordered. She has upcoming appointment with Dr. Douglas (nephrologist), defer to ordering renal function, urine microalbumin as she is planning on updating labs when she sees him. Orders:   HgB A1c; Future

## 2024-04-03 NOTE — Assessment & Plan Note (Addendum)
 Renovascular hypertension Renal artery stenosis  Solitary right kidney  goal blood pressure less than 130 x 80 mmHg. If it is lisinopril  40 mg daily then transition to telmisartan  40 mg daily. Continue metoprolol  tartrate 25 mg twice daily, Lasix  20 mg daily.  Referral to advanced hypertension clinic made today. Orders:   Refer to [facility] ([id number])   telmisartan  (MICARDIS ) 40 MG tablet; Take 1 tablet (40 mg total) by mouth daily.

## 2024-04-03 NOTE — Assessment & Plan Note (Addendum)
 Continue Synthroid  75 mcg daily.  Check TSH. Orders:   TSH; Future

## 2024-04-03 NOTE — Patient Instructions (Signed)
 The following strategies may help manage anxiety symptoms: Slow your breathing: Breathe in through your nose for 4 seconds and out through your mouth for 6 seconds. Continue for a few minutes.  Remind yourself: This is anxiety. It feels uncomfortable, but it is not dangerous.  Stay in the moment: Name 5 things you see, 4 things you feel, and 3 things you hear.  Challenge worried thoughts: Ask yourself, Is this really likely? and What would I tell a friend in this situation?  Keep a daily routine: Regular sleep, meals, and getting dressed each day can help calm anxiety.  Move your body: Walking or gentle exercise can help release stress.  Limit triggers: Reduce caffeine, news, and social media if they worsen your anxiety.  Write worries down: Set aside one specific time each day to write your worries, then let them go.  Talk to someone: Sharing how you feel with a trusted person can help reduce anxiety.  Be kind to yourself: Anxiety is hard, and you are doing your best.    ----- I am sending a BP medication called Telmisartan . Please finish Lisinopril  and start Telmisartan  once you are done with Lisinopril .  Let's schedule fasting lab up front.  Follow up with Dr. Abbey in about 6 months or sooner.

## 2024-04-03 NOTE — Progress Notes (Signed)
 "  Established Patient Office Visit   Subjective  Patient ID: Danielle Park, female    DOB: Dec 11, 1950  Age: 74 y.o. MRN: [id number]  Chief Complaint  Patient presents with   Diabetes   Hypothyroidism   Hyperlipidemia   Insomnia   Back Pain    Discussed the use of AI scribe software for clinical note transcription with the patient, who gave verbal consent to proceed.  HPI Danielle Park is a pleasant 74 year old female with a history of renovascular hypertension, type 2 diabetes with complication, insomnia, acquired hypothyroidism, peripheral vascular disease, chronic back pain, spasm presenting for medication check.   - HTN, CKD, PVD, Renal artery stenosis (S/P right renal artery stent for a high grade stenosis in the solitary right kidney many years ago):  She follows up with vascular surgery department annually.  Blood pressure has been difficult to control.  Currently she is on lisinopril  40 mg daily, Lasix  20 mg daily, metoprolol  to tartrate 25 mg twice a day.  She also has a component of whitecoat hypertension.  She reports blood pressure at home ranging SBP in 150 range.  She also follows up with Dr. Douglas annually for CKD. She is on aspirin 81 mg and Plavix  75 mg daily.   - Type 2 DM with complications including CKD, hyperlipidemia, hypertension: She is currently taking Farxiga  10 mg daily, on Trulicity  weekly injection 4.5 mg weekly. She is tolerating rosuvastatin  10 mg daily.  - Hypothyroidism: Managed with levothyroxine  75 mcg daily.  - Chronic back pain, muscle spasm: She continues to follow-up with pain clinic for pain management.  She also takes as needed baclofen  5 mg.  Our pain is not adequately controlled.  There is also concern of fall related with baclofen  which she has taken up to 4 times a day.  She has tried lidocaine  patch in the past and did not find it to be beneficial.  - Insomnia, grief: Takes trazodone  25 to 50 mg as needed to help with sleep.  Grieves her  loss husband.  No thoughts of SI, HI.     ROS As per HPI    Objective:     BP (!) 150/98 (BP Location: Left Arm, Patient Position: Sitting)   Pulse 71   Temp 97.7 F (36.5 C) (Oral)   Ht 5' 2 (1.575 m)   Wt 166 lb 6.4 oz (75.5 kg)   SpO2 99%   BMI 30.43 kg/m      04/03/2024    4:28 PM 03/10/2024   10:30 AM 03/04/2024   12:58 PM  Depression screen PHQ 2/9  Decreased Interest 0 0 0  Down, Depressed, Hopeless 0 0 0  PHQ - 2 Score 0 0 0  Altered sleeping 0    Tired, decreased energy 0    Change in appetite 0    Feeling bad or failure about yourself  0    Trouble concentrating 0    Suicidal thoughts 0    PHQ-9 Score 0    Difficult doing work/chores Not difficult at all        04/03/2024    4:28 PM 09/19/2023   11:13 AM 07/30/2023    1:06 PM 02/05/2023    1:11 PM  GAD 7 : Generalized Anxiety Score  Nervous, Anxious, on Edge 0 0 0 0  Control/stop worrying 0 0 0 0  Worry too much - different things 0 0 0 0  Trouble relaxing 0 0 0 0  Restless 0 0 0 0  Easily annoyed or irritable 0 0 0 0  Afraid - awful might happen 0 0 0 0  Total GAD 7 Score 0 0 0 0  Anxiety Difficulty Not difficult at all Not difficult at all  Not difficult at all      04/03/2024    4:28 PM 03/10/2024   10:30 AM 03/04/2024   12:58 PM  Depression screen PHQ 2/9  Decreased Interest 0 0 0  Down, Depressed, Hopeless 0 0 0  PHQ - 2 Score 0 0 0  Altered sleeping 0    Tired, decreased energy 0    Change in appetite 0    Feeling bad or failure about yourself  0    Trouble concentrating 0    Suicidal thoughts 0    PHQ-9 Score 0    Difficult doing work/chores Not difficult at all        04/03/2024    4:28 PM 09/19/2023   11:13 AM 07/30/2023    1:06 PM 02/05/2023    1:11 PM  GAD 7 : Generalized Anxiety Score  Nervous, Anxious, on Edge 0 0 0 0  Control/stop worrying 0 0 0 0  Worry too much - different things 0 0 0 0  Trouble relaxing 0 0 0 0  Restless 0 0 0 0  Easily annoyed or irritable 0 0 0 0   Afraid - awful might happen 0 0 0 0  Total GAD 7 Score 0 0 0 0  Anxiety Difficulty Not difficult at all Not difficult at all  Not difficult at all   SDOH Screenings   Food Insecurity: No Food Insecurity (09/12/2021)  Housing: Low Risk (09/12/2021)  Transportation Needs: No Transportation Needs (09/12/2021)  Depression (PHQ2-9): Low Risk (04/03/2024)  Financial Resource Strain: Low Risk (09/12/2021)  Physical Activity: Sufficiently Active (09/12/2021)  Social Connections: Unknown (09/12/2021)  Stress: No Stress Concern Present (09/12/2021)  Tobacco Use: Low Risk (04/03/2024)     Physical Exam Constitutional:      General: She is not in acute distress. HENT:     Head: Normocephalic and atraumatic.     Mouth/Throat:     Mouth: Mucous membranes are moist.  Cardiovascular:     Rate and Rhythm: Normal rate.  Pulmonary:     Effort: Pulmonary effort is normal.     Breath sounds: Normal breath sounds. No wheezing.  Abdominal:     General: Bowel sounds are normal.     Palpations: Abdomen is soft.     Tenderness: There is no guarding.  Musculoskeletal:     Cervical back: Normal range of motion and neck supple.     Right lower leg: No edema.     Left lower leg: No edema.  Lymphadenopathy:     Cervical: No cervical adenopathy.  Neurological:     Mental Status: She is alert and oriented to person, place, and time.  Psychiatric:        Mood and Affect: Mood normal. Affect is tearful (talking about gried related to her late husband).        Behavior: Behavior is cooperative.        Thought Content: Thought content does not include homicidal or suicidal ideation. Thought content does not include homicidal or suicidal plan.        No results found for any visits on 04/03/24.  The 10-year ASCVD risk score (Arnett DK, et al., 2019) is: 37.2%     Assessment & Plan:   Assessment & Plan Type 2 diabetes  mellitus with stage 3b chronic kidney disease, without long-term current use of  insulin  (HCC) Continue Farxiga  10 mg daily, Trulicity  4.5 mg once weekly injection.  Repeat A1c, future lab ordered. She has upcoming appointment with Dr. Douglas (nephrologist), defer to ordering renal function, urine microalbumin as she is planning on updating labs when she sees him. Orders:   HgB A1c; Future  Hypertension associated with diabetes (HCC) Renovascular hypertension Renal artery stenosis  Solitary right kidney  goal blood pressure less than 130 x 80 mmHg. If it is lisinopril  40 mg daily then transition to telmisartan  40 mg daily. Continue metoprolol  tartrate 25 mg twice daily, Lasix  20 mg daily.  Referral to advanced hypertension clinic made today. Orders:   [facility]er to [facility] ([id number])   telmisartan  (MICARDIS ) 40 MG tablet; Take 1 tablet (40 mg total) by mouth daily.  Renovascular hypertension Renal artery stenosis (S/P right renal artery stent for a high grade stenosis in the solitary right kidney many years ago).  Continue follow-up with vascular surgery annually. Plan for hypertension associated with diabetes.  Continue aspirin 81 mg, Plavix  75 mg daily. Orders:   Refer to [facility] ([id number])   telmisartan  (MICARDIS ) 40 MG tablet; Take 1 tablet (40 mg total) by mouth daily.  Acquired hypothyroidism Continue Synthroid  75 mcg daily.  Check TSH. Orders:   TSH; Future  Spasm of thoracic back muscle Associated with thoracic, lumbar back pain.  Continue follow-up with pain clinic.  Counseled against taking baclofen  more than twice daily due to increased risk of fall, fluctuating blood pressure, dizziness.     Hyperlipidemia associated with type 2 diabetes mellitus (HCC) Continue rosuvastatin  10 mg daily.  Check fasting lipid panel, future lab ordered. Orders:   Lipid panel; Future  Adjustment insomnia Continue prn Trazodone  50 mg, 1 tab if unable to sleep.  Discussed relaxation technique to help with sleep as well.        Return in about 6 months (around 10/01/2024) for fasting lab at patient's convinience, f/u with Dr. Abbey in 6 months .   Luke Abbey, MD "

## 2024-04-03 NOTE — Assessment & Plan Note (Addendum)
 Renal artery stenosis (S/P right renal artery stent for a high grade stenosis in the solitary right kidney many years ago).  Continue follow-up with vascular surgery annually. Plan for hypertension associated with diabetes.  Continue aspirin 81 mg, Plavix  75 mg daily. Orders:   Refer to [facility] ([id number])   telmisartan  (MICARDIS ) 40 MG tablet; Take 1 tablet (40 mg total) by mouth daily.

## 2024-04-03 NOTE — Assessment & Plan Note (Addendum)
 Continue prn Trazodone  50 mg, 1 tab if unable to sleep.  Discussed relaxation technique to help with sleep as well.

## 2024-04-03 NOTE — Assessment & Plan Note (Addendum)
 Continue rosuvastatin  10 mg daily.  Check fasting lipid panel, future lab ordered. Orders:   Lipid panel; Future

## 2024-04-03 NOTE — Assessment & Plan Note (Addendum)
 Associated with thoracic, lumbar back pain.  Continue follow-up with pain clinic.  Counseled against taking baclofen  more than twice daily due to increased risk of fall, fluctuating blood pressure, dizziness.

## 2024-04-08 ENCOUNTER — Other Ambulatory Visit (INDEPENDENT_AMBULATORY_CARE_PROVIDER_SITE_OTHER)

## 2024-04-08 DIAGNOSIS — E039 Hypothyroidism, unspecified: Secondary | ICD-10-CM | POA: Diagnosis not present

## 2024-04-08 DIAGNOSIS — N1832 Chronic kidney disease, stage 3b: Secondary | ICD-10-CM | POA: Diagnosis not present

## 2024-04-08 DIAGNOSIS — E785 Hyperlipidemia, unspecified: Secondary | ICD-10-CM

## 2024-04-08 DIAGNOSIS — E1169 Type 2 diabetes mellitus with other specified complication: Secondary | ICD-10-CM | POA: Diagnosis not present

## 2024-04-08 DIAGNOSIS — E1122 Type 2 diabetes mellitus with diabetic chronic kidney disease: Secondary | ICD-10-CM | POA: Diagnosis not present

## 2024-04-08 LAB — TSH: TSH: 3.72 u[IU]/mL (ref 0.35–5.50)

## 2024-04-08 LAB — LIPID PANEL
Cholesterol: 142 mg/dL (ref 28–200)
HDL: 55.3 mg/dL
LDL Cholesterol: 63 mg/dL (ref 10–99)
NonHDL: 86.33
Total CHOL/HDL Ratio: 3
Triglycerides: 118 mg/dL (ref 10.0–149.0)
VLDL: 23.6 mg/dL (ref 0.0–40.0)

## 2024-04-08 LAB — HEMOGLOBIN A1C: Hgb A1c MFr Bld: 6.6 % — ABNORMAL HIGH (ref 4.6–6.5)

## 2024-04-10 ENCOUNTER — Ambulatory Visit: Payer: Self-pay

## 2024-04-10 ENCOUNTER — Ambulatory Visit: Admitting: Pain Medicine

## 2024-04-17 ENCOUNTER — Ambulatory Visit
Admission: RE | Admit: 2024-04-17 | Discharge: 2024-04-17 | Disposition: A | Discharge status: Home / Self Care | Source: Ambulatory Visit | Attending: Pain Medicine | Admitting: Pain Medicine

## 2024-04-17 ENCOUNTER — Ambulatory Visit (HOSPITAL_BASED_OUTPATIENT_CLINIC_OR_DEPARTMENT_OTHER): Admitting: Pain Medicine

## 2024-04-17 VITALS — BP 115/49 | HR 73 | Temp 97.7°F | Resp 13 | Ht 62.0 in | Wt 166.0 lb

## 2024-04-17 DIAGNOSIS — M47894 Other spondylosis, thoracic region: Secondary | ICD-10-CM | POA: Insufficient documentation

## 2024-04-17 DIAGNOSIS — G8929 Other chronic pain: Secondary | ICD-10-CM | POA: Diagnosis not present

## 2024-04-17 DIAGNOSIS — M5134 Other intervertebral disc degeneration, thoracic region: Secondary | ICD-10-CM | POA: Insufficient documentation

## 2024-04-17 DIAGNOSIS — M546 Pain in thoracic spine: Secondary | ICD-10-CM | POA: Insufficient documentation

## 2024-04-17 DIAGNOSIS — M545 Low back pain, unspecified: Secondary | ICD-10-CM

## 2024-04-17 DIAGNOSIS — M4810 Ankylosing hyperostosis [Forestier], site unspecified: Secondary | ICD-10-CM

## 2024-04-17 DIAGNOSIS — Z7901 Long term (current) use of anticoagulants: Secondary | ICD-10-CM | POA: Diagnosis present

## 2024-04-17 DIAGNOSIS — M47814 Spondylosis without myelopathy or radiculopathy, thoracic region: Secondary | ICD-10-CM | POA: Diagnosis present

## 2024-04-17 MED ORDER — ROPIVACAINE HCL 2 MG/ML IJ SOLN
18.0000 mL | Freq: Once | INTRAMUSCULAR | Status: AC
  Administered 2024-04-17: 18 mL via PERINEURAL
  Filled 2024-04-17: qty 20

## 2024-04-17 MED ORDER — MIDAZOLAM HCL (PF) 2 MG/2ML IJ SOLN
0.5000 mg | Freq: Once | INTRAMUSCULAR | Status: AC
  Administered 2024-04-17: 2 mg via INTRAVENOUS
  Filled 2024-04-17: qty 2

## 2024-04-17 MED ORDER — LIDOCAINE HCL 2 % IJ SOLN
20.0000 mL | Freq: Once | INTRAMUSCULAR | Status: AC
  Administered 2024-04-17: 400 mg
  Filled 2024-04-17: qty 20

## 2024-04-17 MED ORDER — PENTAFLUOROPROP-TETRAFLUOROETH EX AERO
INHALATION_SPRAY | Freq: Once | CUTANEOUS | Status: AC
  Administered 2024-04-17: 30 via TOPICAL

## 2024-04-17 MED ORDER — FENTANYL CITRATE (PF) 100 MCG/2ML IJ SOLN
25.0000 ug | INTRAMUSCULAR | Status: DC | PRN
  Filled 2024-04-17: qty 2

## 2024-04-17 MED ORDER — DEXAMETHASONE SOD PHOSPHATE PF 10 MG/ML IJ SOLN
20.0000 mg | Freq: Once | INTRAMUSCULAR | Status: AC
  Administered 2024-04-17: 20 mg
  Filled 2024-04-17: qty 2

## 2024-04-17 NOTE — Progress Notes (Signed)
 PROVIDER NOTE: Interpretation of information contained herein should be left to medically-trained personnel. Specific patient instructions are provided elsewhere under Patient Instructions section of medical record. This document was created in part using STT-dictation technology, any transcriptional errors that may result from this process are unintentional.  Patient: Danielle Park Type: Established DOB: 1950-10-06 MRN: [id number] PCP: Abbey Bruckner, MD  Service: Procedure DOS: 04/17/2024 Setting: Ambulatory Location: Ambulatory outpatient facility Delivery: Face-to-face Provider: Eric DELENA Como, MD Specialty: Interventional Pain Management Specialty designation: 09 Location: Outpatient facility Ref. Prov.: Bair, Kalpana, MD       Interventional Therapy   Procedure: Thoracic facet medial branch block #2  Laterality: Bilateral (-50)  Level: T4, T5, T6, and T7 Medial Branch Level(s)  Imaging: Fluoroscopy-guided Spinal ([id number]) Anesthesia: Local anesthesia (1-2% Lidocaine ) Anxiolysis: IV Versed   2.0 mg           Analgesia: No Sedation None required. No Fentanyl  administered.         DOS: 04/17/2024  Performed by: Eric DELENA Como, MD  Purpose: Diagnostic/Therapeutic Indications: Thoracic back pain severe enough to impact quality of life or function. Rationale (medical necessity): procedure needed and proper for the diagnosis and/or treatment of Danielle Park's medical symptoms and needs. 1. Chronic thoracic back pain (1ry area of Pain) (Intermittent) (Bilateral)   2. Thoracic facet joint pain   3. Thoracic facet syndrome   4. Thoracic facet arthropathy (Multilevel) (Bilateral)   5. Thoracolumbar back pain   6. Spondylosis without myelopathy or radiculopathy, thoracic region   7. DISH (diffuse idiopathic skeletal hyperostosis)   8. Thoracic DDD   9. Chronic anticoagulation (Plavix )    NAS-11 Pain score:   Pre-procedure: 0-No pain/10   Post-procedure: 0-No pain/10      Target: Thoracic posterior (dorsal) primary rami nerve Location: 2.5 cm lateral to midline (spinous process), just cephalad to upper transverse process edge.  (needle tip placement) Region: Thoracic  Approach: Paravertebral  Type of procedure: Percutaneous perineural nerve block   Pertinent Anatomy: There are two potential fascial compartments in the TPVS: the anterior extrapleural paravertebral compartment and the posterior subendothoracic paravertebral compartment. The transverse process and the superior costotransverse ligament form the posterior boundary.  Position / Prep / Materials:  Position: Prone  Prep solution: ChloraPrep (2% chlorhexidine gluconate and 70% isopropyl alcohol) Prep Area: Entire upper back region Materials:  Tray: Block Needle(s):  Type: Spinal  Gauge (G): 22  Length: 3.5-in  Qty: 4  H&P (Pre-op Assessment):  Danielle Park is a 74 y.o. (year old), female patient, seen today for interventional treatment. She  has a past surgical history that includes Cesarean section; Vaginal delivery; Renal artery stent (2014); Cardiac catheterization (N/A, 01/11/2015); Cardiac catheterization (01/11/2015); Colonoscopy (N/A, 01/23/2024); and Polypectomy (01/23/2024). Danielle Park has a current medication list which includes the following prescription(s): aspirin, baclofen , cholecalciferol, clopidogrel , dapagliflozin  propanediol, trulicity , furosemide , levothyroxine , metoprolol  tartrate, rosuvastatin , telmisartan , and trazodone , and the following Facility-Administered Medications: fentanyl . Her primarily concern today is the Back Pain  Initial Vital Signs:  Pulse/HCG Rate: 73ECG Heart Rate: 70 Temp: 97.7 F (36.5 C) Resp: 16 BP: (!) 144/64 SpO2: 97 %  BMI: Estimated body mass index is 30.36 kg/m as calculated from the following:   Height as of this encounter: 5' 2 (1.575 m).   Weight as of this encounter: 166 lb (75.3 kg).  Risk Assessment: Allergies: Reviewed. She has no  known allergies.  Allergy Precautions: None required Coagulopathies: Reviewed. None identified.  Blood-thinner therapy: None at this time Active Infection(s): Reviewed. None  identified. Danielle Park is afebrile  Site Confirmation: Danielle Park was asked to confirm the procedure and laterality before marking the site Procedure checklist: Completed Consent: Before the procedure and under the influence of no sedative(s), amnesic(s), or anxiolytics, the patient was informed of the treatment options, risks and possible complications. To fulfill our ethical and legal obligations, as recommended by the American Medical Association's Code of Ethics, I have informed the patient of my clinical impression; the nature and purpose of the treatment or procedure; the risks, benefits, and possible complications of the intervention; the alternatives, including doing nothing; the risk(s) and benefit(s) of the alternative treatment(s) or procedure(s); and the risk(s) and benefit(s) of doing nothing. The patient was provided information about the general risks and possible complications associated with the procedure. These may include, but are not limited to: failure to achieve desired goals, infection, bleeding, organ or nerve damage, allergic reactions, paralysis, and death. In addition, the patient was informed of those risks and complications associated to Spine-related procedures, such as failure to decrease pain; infection (i.e.: Meningitis, epidural or intraspinal abscess); bleeding (i.e.: epidural hematoma, subarachnoid hemorrhage, or any other type of intraspinal or peri-dural bleeding); organ or nerve damage (i.e.: Any type of peripheral nerve, nerve root, or spinal cord injury) with subsequent damage to sensory, motor, and/or autonomic systems, resulting in permanent pain, numbness, and/or weakness of one or several areas of the body; allergic reactions; (i.e.: anaphylactic reaction); and/or death. Furthermore, the  patient was informed of those risks and complications associated with the medications. These include, but are not limited to: allergic reactions (i.e.: anaphylactic or anaphylactoid reaction(s)); adrenal axis suppression; blood sugar elevation that in diabetics may result in ketoacidosis or comma; water retention that in patients with history of congestive heart failure may result in shortness of breath, pulmonary edema, and decompensation with resultant heart failure; weight gain; swelling or edema; medication-induced neural toxicity; particulate matter embolism and blood vessel occlusion with resultant organ, and/or nervous system infarction; and/or aseptic necrosis of one or more joints. Finally, the patient was informed that Medicine is not an exact science; therefore, there is also the possibility of unforeseen or unpredictable risks and/or possible complications that may result in a catastrophic outcome. The patient indicated having understood very clearly. We have given the patient no guarantees and we have made no promises. Enough time was given to the patient to ask questions, all of which were answered to the patient's satisfaction. Danielle Park has indicated that she wanted to continue with the procedure. Attestation: I, the ordering provider, attest that I have discussed with the patient the benefits, risks, side-effects, alternatives, likelihood of achieving goals, and potential problems during recovery for the procedure that I have provided informed consent. Date  Time: 04/17/2024 11:27 AM  Pre-Procedure Preparation:  Monitoring: As per clinic protocol. Respiration, ETCO2, SpO2, BP, heart rate and rhythm monitor placed and checked for adequate function Safety Precautions: Patient was assessed for positional comfort and pressure points before starting the procedure. Time-out: I initiated and conducted the Time-out before starting the procedure, as per protocol. The patient was asked to  participate by confirming the accuracy of the Time Out information. Verification of the correct person, site, and procedure were performed and confirmed by me, the nursing staff, and the patient. Time-out conducted as per Joint Commission's Universal Protocol (UP.01.01.01). Time: 1213 Start Time: 1213 hrs.  Description/Narrative of Procedure:          Start Time: 1213 hrs. Technical description of procedure:  Rationale (  medical necessity): procedure needed and proper for the diagnosis and/or treatment of the patient's medical symptoms and needs. Procedural Technique Safety Precautions: Aspiration looking for blood return was conducted prior to all injections. At no point did we inject any substances, as a needle was being advanced. No attempts were made at seeking any paresthesias. Safe injection practices and needle disposal techniques used. Medications properly checked for expiration dates. SDV (single dose vial) medications used. Target Area: For the thoracic medial branch nerve, the target is located between the top of the transverse processes at the point where the transverse process joints the vertebra and the superior-lateral aspect of the transverse process.  (More lateral towards the superior aspect of the thoracic spine.) For safety reasons and to minimize the risk of hemo- or pneumothorax, the needle tip was not advanced past the boundary of the posterior paravertebral compartment (superior costotransverse ligament). Description of the Procedure: Protocol guidelines were followed. The patient was placed in position over the fluoroscopy table. The target area was identified and the area prepped in the usual manner. Skin & deeper tissues infiltrated with local anesthetic. Appropriate amount of time allowed to pass for local anesthetics to take effect. The procedure needles were then advanced to the target area. Proper needle placement secured. Negative aspiration confirmed. Solution injected  in intermittent fashion, asking for systemic symptoms every 0.5cc of injectate. The needles were then removed and the area cleansed, making sure to leave some of the prepping solution back to take advantage of its long term bactericidal properties.             Facet Joint Innervation  T1-2 C8, T1  Medial Branch  T2-3 T1, T2                     T3-4 T2, T3                     T4-5 T3, T4                     T5-6 T4, T5                     T6-7 T5, T6                     T7-8 T6, T7                     T8-9 T7, T8                     T9-10 T8, T9                     T10-11 T9, T10                     T11-12 T10, T11                     T12-L1 T11, T12                       Vitals:   04/17/24 1229 04/17/24 1233 04/17/24 1237 04/17/24 1242  BP: (!) 111/43 (!) 108/48 (!) 115/49 (!) (P) 121/48  Pulse:      Resp:      Temp:      SpO2: 99% 99% 100% (P) 100%  Weight:      Height:  End Time: 1223 hrs.  Imaging Guidance (Spinal):         Type of Imaging Technique: Fluoroscopy Guidance (Spinal) Indication(s): Fluoroscopy guidance for needle placement to enhance accuracy in procedures requiring precise needle localization for targeted delivery of medication in or near specific anatomical locations not easily accessible without such real-time imaging assistance. Exposure Time: Please see nurses notes. Contrast: None used. Fluoroscopic Guidance: I was personally present during the use of fluoroscopy. Tunnel Vision Technique used to obtain the best possible view of the target area. Parallax error corrected before commencing the procedure. Direction-depth-direction technique used to introduce the needle under continuous pulsed fluoroscopy. Once target was reached, antero-posterior, oblique, and lateral fluoroscopic projection used confirm needle placement in all planes. Images permanently stored in EMR. Interpretation: No contrast injected. I personally  interpreted the imaging intraoperatively. Adequate needle placement confirmed in multiple planes. Permanent images saved into the patient's record.  Post-operative Assessment:  Post-procedure Vital Signs:  Pulse/HCG Rate: 73(!) (P) 56 Temp: 97.7 F (36.5 C) Resp: 13 BP: (!) (P) 121/48 SpO2: (P) 100 %  EBL: None  Complications: No immediate post-treatment complications observed by team, or reported by patient.  Note: The patient tolerated the entire procedure well. A repeat set of vitals were taken after the procedure and the patient was kept under observation following institutional policy, for this type of procedure. Post-procedural neurological assessment was performed, showing return to baseline, prior to discharge. The patient was provided with post-procedure discharge instructions, including a section on how to identify potential problems. Should any problems arise concerning this procedure, the patient was given instructions to immediately contact us , at any time, without hesitation. In any case, we plan to contact the patient by telephone for a follow-up status report regarding this interventional procedure.  Comments:  No additional relevant information.  Plan of Care (POC)  Orders:  Orders Placed This Encounter  Procedures   THORACIC FACET BLOCK    Scheduling Instructions:     Thoracic Medial Branch Block     Side: Bilateral     Level(s): TBD     Procedural Analgesia/Anxiolysis: Patient's choice     Date: 04/17/2024    Where will this procedure be performed?:   [facility]   [facility] C-ARM 1-60 MIN NO REPORT    Intraoperative interpretation by procedural physician at [facility].    Standing Status:   Standing    Number of Occurrences:   1    Reason for exam::   Assistance in needle guidance and placement for procedures requiring needle placement in or near specific anatomical locations not easily accessible without such assistance.   Informed  Consent Details: Physician/Practitioner Attestation; Transcribe to consent form and obtain patient signature    Nursing Order: Transcribe to consent form and obtain patient signature. Note: Always confirm laterality of pain with Danielle Park, before procedure.    Physician/Practitioner attestation of informed consent for procedure/surgical case:   I, the physician/practitioner, attest that I have discussed with the patient the benefits, risks, side effects, alternatives, likelihood of achieving goals and potential problems during recovery for the procedure that I have provided informed consent.    Procedure:   Thoracic medial branch facet block    Physician/Practitioner performing the procedure:   Doy Taaffe A. Tanya, MD    Indication/Reason:   Thoracic upper back pain associated with thoracic facet syndrome (F52.105) and thoracic spondylosis without myelopathy or radiculopathy (F52.185)   Care order/instruction: Please confirm that the patient has stopped  the Plavix  (Clopidogrel ) x 7-10 days prior to procedure or surgery.    Please confirm that the patient has stopped the Plavix  (Clopidogrel ) x 7-10 days prior to procedure or surgery.    Standing Status:   Standing    Number of Occurrences:   1   Provide equipment / supplies at bedside    Procedure tray: Block Tray (Disposable  single use) Skin infiltration needle: Regular 1.5-in, 25-G, (x1) Block Needle type: Spinal Amount/quantity: 4 Size: Regular (3.5-inch) Gauge: 22G    Standing Status:   Standing    Number of Occurrences:   1    Specify:   Block Tray   Saline lock IV    Have LR [phone] mL available and administer at 125 mL/hr if patient becomes hypotensive.    Standing Status:   Standing    Number of Occurrences:   1   Bleeding precautions    Standing Status:   Standing    Number of Occurrences:   1     Opioid Analgesic: None MME/day: 0 mg/day    Medications ordered for procedure: Meds ordered this encounter  Medications    lidocaine  (XYLOCAINE ) 2 % (with pres) injection 400 mg   pentafluoroprop-tetrafluoroeth (GEBAUERS) aerosol   midazolam  PF (VERSED ) injection 0.5-2 mg    Make sure Flumazenil is available in the pyxis when using this medication. If oversedation occurs, administer 0.2 mg IV over 15 sec. If after 45 sec no response, administer 0.2 mg again over 1 min; may repeat at 1 min intervals; not to exceed 4 doses (1 mg)   fentaNYL  (SUBLIMAZE ) injection 25-50 mcg    Make sure Narcan is available in the pyxis when using this medication. In the event of respiratory depression (RR< 8/min): Titrate NARCAN (naloxone) in increments of 0.1 to 0.2 mg IV at 2-3 minute intervals, until desired degree of reversal.   ropivacaine  (PF) 2 mg/mL (0.2%) (NAROPIN ) injection 18 mL   dexamethasone  (DECADRON ) injection 20 mg   Medications administered: We administered lidocaine , pentafluoroprop-tetrafluoroeth, midazolam  PF, ropivacaine  (PF) 2 mg/mL (0.2%), and dexamethasone .  See the medical record for exact dosing, route, and time of administration.    Interventional Therapies  Risk Factors  Considerations  Medical Comorbidities:  Plavix  Anticoagulation: (Stop: 7 days  Restart: 2 hours)  Hx. DVT  T2NIDDM w/ Stage III CKD  Single Kidney  Renal Artery Stenosis  HTN  DISH  PVD     Planned  Pending:   Diagnostic/therapeutic bilateral thoracic facet (T4-T7) MBB #2    Under consideration:   Diagnostic/therapeutic bilateral thoracic facet (T4-T7) MBB #2 with possible RFA follow-up   Completed: (Analgesic benefit)1  Diagnostic/therapeutic bilateral thoracic facet MBB x1 (01/15/2024) (100/100/100/75) (stopped spasms x 29mo)   Therapeutic  Palliative (PRN) options:   None established   Completed by other providers:   None reported  1(Analgesic benefit): Expressed in percentage (%). (Local anesthetic[LA] +/- sedation  L.A.Local Anesthetic  Steroid benefit  Ongoing benefit)      Follow-up plan:   Return in  about 2 weeks (around 05/01/2024) for (Face2F), (PPE).     Recent Visits Date Type Provider Dept  03/10/24 Office Visit Tanya Glisson, MD [facility]  03/04/24 Office Visit Patel, Seema K, NP [facility]  02/04/24 Office Visit Tanya Glisson, MD [facility]  Showing recent visits within past 90 days and meeting all other requirements Today's Visits Date Type Provider Dept  04/17/24 Procedure visit Tanya Glisson, MD [facility]  Showing today's visits  and meeting all other requirements Future Appointments Date Type Provider Dept  05/07/24 Appointment Tanya Glisson, MD [facility]  Showing future appointments within next 90 days and meeting all other requirements   Disposition: Discharge home  Discharge (Date  Time): 04/17/2024;   hrs.   Primary Care Physician: Bair, Kalpana, MD Location: [facility] Note by: Glisson DELENA Tanya, MD (TTS technology used. I apologize for any typographical errors that were not detected and corrected.) Date: 04/17/2024; Time: 12:58 PM  Disclaimer:  Medicine is not an visual merchandiser. The only guarantee in medicine is that nothing is guaranteed. It is important to note that the decision to proceed with this intervention was based on the information collected from the patient. The Data and conclusions were drawn from the patient's questionnaire, the interview, and the physical examination. Because the information was provided in large part by the patient, it cannot be guaranteed that it has not been purposely or unconsciously manipulated. Every effort has been made to obtain as much relevant data as possible for this evaluation. It is important to note that the conclusions that lead to this procedure are derived in large part from the available data. Always take into account that the treatment will also be dependent on availability of resources and existing treatment  guidelines, considered by other Pain Management Practitioners as being common knowledge and practice, at the time of the intervention. For Medico-Legal purposes, it is also important to point out that variation in procedural techniques and pharmacological choices are the acceptable norm. The indications, contraindications, technique, and results of the above procedure should only be interpreted and judged by a Board-Certified Interventional Pain Specialist with extensive familiarity and expertise in the same exact procedure and technique.

## 2024-04-17 NOTE — Patient Instructions (Signed)
 ______________________________________________________________________    Blood Thinners  IMPORTANT NOTICE:  If you take any of these, make sure to notify the nursing staff.  Failure to do so may result in serious injury.  Recommended time intervals to stop and restart blood-thinners, before & after invasive procedures  Generic Name Brand Name Pre-procedure: Stop medication for this amount of time before your procedure: Post-procedure: Wait this amount of time after the procedure before restarting your medication:  Abciximab Reopro 15 days 2 hrs  Alteplase Activase 10 days 10 days  Anagrelide Agrylin    Apixaban Eliquis 3 days 6 hrs  Cilostazol Pletal 3 days 5 hrs  Clopidogrel  Plavix  7-10 days 2 hrs  Dabigatran Pradaxa 5 days 6 hrs  Dalteparin Fragmin 24 hours 4 hrs  Dipyridamole Aggrenox 11days 2 hrs  Edoxaban Lixiana; Savaysa 3 days 2 hrs  Enoxaparin   Lovenox  24 hours 4 hrs  Eptifibatide Integrillin 8 hours 2 hrs  Fondaparinux  Arixtra 72 hours 12 hrs  Hydroxychloroquine Plaquenil 11 days   Prasugrel Effient 7-10 days 6 hrs  Reteplase Retavase 10 days 10 days  Rivaroxaban Xarelto 3 days 6 hrs  Ticagrelor Brilinta 5-7 days 6 hrs  Ticlopidine Ticlid 10-14 days 2 hrs  Tinzaparin Innohep 24 hours 4 hrs  Tirofiban Aggrastat 8 hours 2 hrs  Warfarin Coumadin 5 days 2 hrs   Other medications with blood-thinning effects  NOTE: Consider stopping these if you have prolonged bleeding despite not taking any of the above blood thinners. Otherwise ask your provider and this will be decided on a case-by-case basis.  Product indications Generic (Brand) names Note  Cholesterol Lipitor Stop 4 days before procedure  Blood thinner (injectable) Heparin  (LMW or LMWH Heparin ) Stop 24 hours before procedure  Cancer Ibrutinib (Imbruvica) Stop 7 days before procedure  Malaria/Rheumatoid Hydroxychloroquine (Plaquenil) Stop 11 days before procedure  Thrombolytics  10 days before or after procedures    Over-the-counter (OTC) Products with blood-thinning effects  NOTE: Consider stopping these if you have prolonged bleeding despite not taking any of the above blood thinners. Otherwise ask your provider and this will be decided on a case-by-case basis.  Product Common names Stop Time  Aspirin  > 325 mg Goody Powders, Excedrin, etc. 11 days  Aspirin  <= 81 mg  7 days  Fish oil  4 days  Garlic supplements  7 days  Ginkgo biloba  36 hours  Ginseng  24 hours  NSAIDs Ibuprofen , Naprosyn , etc. 3 days  Vitamin E  4 days   ______________________________________________________________________     ______________________________________________________________________    Post-Procedure Discharge Instructions  INSTRUCTIONS Apply ice:  Purpose: This will minimize any swelling and discomfort after procedure.  When: Day of procedure, as soon as you get home. How: Fill a plastic sandwich bag with crushed ice. Cover it with a small towel and apply to injection site. How long: (15 min on, 15 min off) Apply for 15 minutes then remove x 15 minutes.  Repeat sequence on day of procedure, until you go to bed. Apply heat:  Purpose: To treat any soreness and discomfort from the procedure. When: Starting the next day after the procedure. How: Apply heat to procedure site starting the day following the procedure. How long: May continue to repeat daily, until discomfort goes away. Food intake: Start with clear liquids (like water) and advance to regular food, as tolerated.  Physical activities: Keep activities to a minimum for the first 8 hours after the procedure. After that, then as tolerated. Driving: If you have received  any sedation, be responsible and do not drive. You are not allowed to drive for 24 hours after having sedation. Blood thinner: (Applies only to those taking blood thinners) You may restart your blood thinner 6 hours after your procedure. Insulin : (Applies only to Diabetic patients taking  insulin ) As soon as you can eat, you may resume your normal dosing schedule. Infection prevention: Keep procedure site clean and dry. Shower daily and clean area with soap and water.  PAIN DIARY Post-procedure Pain Diary: Extremely important that this be done correctly and accurately. Recorded information will be used to determine the next step in treatment. For the purpose of accuracy, follow these rules: Evaluate only the area treated. Do not report or include pain from an untreated area. For the purpose of this evaluation, ignore all other areas of pain, except for the treated area. After your procedure, avoid taking a long nap and attempting to complete the pain diary after you wake up. Instead, set your alarm clock to go off every hour, on the hour, for the initial 8 hours after the procedure. Document the duration of the numbing medicine, and the relief you are getting from it. Do not go to sleep and attempt to complete it later. It will not be accurate. If you received sedation, it is likely that you were given a medication that may cause amnesia. Because of this, completing the diary at a later time may cause the information to be inaccurate. This information is needed to plan your care. Follow-up appointment: Keep your post-procedure follow-up evaluation appointment after the procedure (usually 2 weeks for most procedures, 6 weeks for radiofrequencies). DO NOT FORGET to bring you pain diary with you.   EXPECT... (What should I expect to see with my procedure?) From numbing medicine (AKA: Local Anesthetics): Numbness or decrease in pain. You may also experience some weakness, which if present, could last for the duration of the local anesthetic. Onset: Full effect within 15 minutes of injected. Duration: It will depend on the type of local anesthetic used. On the average, 1 to 8 hours.  From steroids (Applies only if steroids were used): Decrease in swelling or inflammation. Once inflammation  is improved, relief of the pain will follow. Onset of benefits: Depends on the amount of swelling present. The more swelling, the longer it will take for the benefits to be seen. In some cases, up to 10 days. Duration: Steroids will stay in the system x 2 weeks. Duration of benefits will depend on multiple posibilities including persistent irritating factors. Side-effects: If present, they may typically last 2 weeks (the duration of the steroids). Frequent: Cramps (if they occur, drink Gatorade and take over-the-counter Magnesium  450-500 mg once to twice a day); water retention with temporary weight gain; increases in blood sugar; decreased immune system response; increased appetite. Occasional: Facial flushing (red, warm cheeks); mood swings; menstrual changes. Uncommon: Long-term decrease or suppression of natural hormones; bone thinning. (These are more common with higher doses or more frequent use. This is why we prefer that our patients avoid having any injection therapies in other practices.)  Very Rare: Severe mood changes; psychosis; aseptic necrosis. From procedure: Some discomfort is to be expected once the numbing medicine wears off. This should be minimal if ice and heat are applied as instructed.  CALL IF... (When should I call?) You experience numbness and weakness that gets worse with time, as opposed to wearing off. New onset bowel or bladder incontinence. (Applies only to procedures done in the  spine)  Emergency Numbers: Durning business hours (Monday - Thursday, 8:00 AM - 4:00 PM) (Friday, 9:00 AM - 12:00 Noon): [phone] After hours: [phone] NOTE: If you are having a problem and are unable connect with, or to talk to a provider, then go to your nearest urgent care or emergency department. If the problem is serious and urgent, please call 911. ______________________________________________________________________      ______________________________________________________________________    Steroid injections  Common steroids for injections Triamcinolone : Used by many sports medicine physicians for large joint and bursal injections, often combined with a local anesthetic like lidocaine . A study focusing on coccydynia (tailbone pain) found triamcinolone  was more effective than betamethasone, suggesting it may also be preferable for other localized inflammation conditions. Methylprednisolone : A common alternative to triamcinolone  that is also a strong anti-inflammatory. It is available in different formulations, with the acetate suspension being the long-acting option for intra-articular injections. Dexamethasone : This is a non-particulate steroid, meaning it has a lower risk of tissue damage compared to particulate steroids like triamcinolone  and methylprednisolone . While less common for this specific use, it is an option for targeted injections.   Considerations for physicians Particulate vs. non-particulate steroids: Triamcinolone  and methylprednisolone  are particulate, meaning they can clump together. Dexamethasone  is non-particulate. Particulate steroids are often preferred for their longer-lasting effects but carry a theoretical higher risk for certain injections (though this is less of a concern in the costochondral joints). Combined injectate: Corticosteroids are typically mixed with a local anesthetic like lidocaine  to provide both immediate pain relief (from the anesthetic) and longer-term inflammation reduction (from the steroid). Imaging guidance: To ensure accurate placement of the needle and medication, physicians may use ultrasound or fluoroscopic guidance for the injection, especially in complex or refractory cases.   Patient guidance Before undergoing a steroid injection, discuss the options with your physician. They will determine the best steroid, dosage, and procedure for your specific case  based on factors like: Severity of your condition History of response to other treatments Your overall health status Experience and preference of the physician  Last  Updated: 11/20/2023 ______________________________________________________________________

## 2024-04-18 ENCOUNTER — Telehealth: Payer: Self-pay

## 2024-04-18 NOTE — Telephone Encounter (Signed)
 Post procedure follow up.  LM

## 2024-04-22 ENCOUNTER — Other Ambulatory Visit: Payer: Self-pay

## 2024-04-22 DIAGNOSIS — E1122 Type 2 diabetes mellitus with diabetic chronic kidney disease: Secondary | ICD-10-CM

## 2024-04-22 MED ORDER — TRULICITY 4.5 MG/0.5ML ~~LOC~~ SOAJ: SUBCUTANEOUS | 3 refills | Status: AC

## 2024-05-07 ENCOUNTER — Ambulatory Visit: Admitting: Pain Medicine

## 2024-10-01 ENCOUNTER — Ambulatory Visit

## 2024-10-03 ENCOUNTER — Encounter (INDEPENDENT_AMBULATORY_CARE_PROVIDER_SITE_OTHER)

## 2024-10-03 ENCOUNTER — Ambulatory Visit (INDEPENDENT_AMBULATORY_CARE_PROVIDER_SITE_OTHER): Admitting: Nurse Practitioner
